# Patient Record
Sex: Female | Born: 1970 | Race: Black or African American | Hispanic: No | Marital: Married | Smoking: Former smoker
Health system: Southern US, Community
[De-identification: ages and names within clinical notes are randomized; demographics above are authoritative.]

## PROBLEM LIST (undated history)

## (undated) DIAGNOSIS — R9389 Abnormal findings on diagnostic imaging of other specified body structures: Secondary | ICD-10-CM

## (undated) DIAGNOSIS — D649 Anemia, unspecified: Secondary | ICD-10-CM

## (undated) DIAGNOSIS — R000 Tachycardia, unspecified: Secondary | ICD-10-CM

## (undated) DIAGNOSIS — J45909 Unspecified asthma, uncomplicated: Secondary | ICD-10-CM

## (undated) DIAGNOSIS — I272 Pulmonary hypertension, unspecified: Secondary | ICD-10-CM

## (undated) DIAGNOSIS — D869 Sarcoidosis, unspecified: Secondary | ICD-10-CM

## (undated) DIAGNOSIS — I700 Atherosclerosis of aorta: Secondary | ICD-10-CM

## (undated) HISTORY — DX: Atherosclerosis of aorta: I70.0

## (undated) HISTORY — DX: Sarcoidosis, unspecified: D86.9

## (undated) HISTORY — DX: Pulmonary hypertension, unspecified: I27.20

## (undated) HISTORY — DX: Abnormal findings on diagnostic imaging of other specified body structures: R93.89

## (undated) HISTORY — DX: Anemia, unspecified: D64.9

## (undated) HISTORY — DX: Tachycardia, unspecified: R00.0

## (undated) HISTORY — PX: TUBAL LIGATION: SHX77

## (undated) HISTORY — DX: Unspecified asthma, uncomplicated: J45.909

---

## 2008-09-02 ENCOUNTER — Encounter: Payer: Self-pay | Admitting: Internal Medicine

## 2008-09-03 ENCOUNTER — Ambulatory Visit: Payer: Self-pay | Admitting: Internal Medicine

## 2008-09-03 DIAGNOSIS — J301 Allergic rhinitis due to pollen: Secondary | ICD-10-CM

## 2008-09-03 DIAGNOSIS — J45998 Other asthma: Secondary | ICD-10-CM

## 2008-09-03 LAB — CONVERTED CEMR LAB: Angiotensin 1 Converting Enzyme: 93 units/L — ABNORMAL HIGH (ref 9–67)

## 2008-09-05 HISTORY — PX: BRONCHOSCOPY: SUR163

## 2008-09-11 ENCOUNTER — Ambulatory Visit: Admission: RE | Admit: 2008-09-11 | Discharge: 2008-09-11 | Payer: Self-pay | Admitting: Internal Medicine

## 2008-09-11 ENCOUNTER — Encounter: Payer: Self-pay | Admitting: Internal Medicine

## 2008-09-11 ENCOUNTER — Ambulatory Visit: Payer: Self-pay | Admitting: Internal Medicine

## 2008-09-17 ENCOUNTER — Ambulatory Visit: Payer: Self-pay | Admitting: Internal Medicine

## 2008-09-17 ENCOUNTER — Telehealth: Payer: Self-pay | Admitting: Internal Medicine

## 2008-09-17 DIAGNOSIS — D869 Sarcoidosis, unspecified: Secondary | ICD-10-CM

## 2008-09-22 ENCOUNTER — Ambulatory Visit: Payer: Self-pay | Admitting: Internal Medicine

## 2008-09-22 DIAGNOSIS — R0602 Shortness of breath: Secondary | ICD-10-CM

## 2008-10-03 ENCOUNTER — Telehealth (INDEPENDENT_AMBULATORY_CARE_PROVIDER_SITE_OTHER): Payer: Self-pay | Admitting: *Deleted

## 2008-10-23 ENCOUNTER — Ambulatory Visit: Payer: Self-pay | Admitting: Internal Medicine

## 2008-10-30 ENCOUNTER — Telehealth (INDEPENDENT_AMBULATORY_CARE_PROVIDER_SITE_OTHER): Payer: Self-pay | Admitting: *Deleted

## 2008-11-21 ENCOUNTER — Ambulatory Visit: Payer: Self-pay | Admitting: Internal Medicine

## 2008-12-08 LAB — CONVERTED CEMR LAB: Angiotensin 1 Converting Enzyme: 41 units/L (ref 13–100)

## 2009-01-22 ENCOUNTER — Ambulatory Visit: Payer: Self-pay | Admitting: Internal Medicine

## 2009-04-02 ENCOUNTER — Ambulatory Visit: Payer: Self-pay | Admitting: Internal Medicine

## 2009-06-02 ENCOUNTER — Ambulatory Visit: Payer: Self-pay | Admitting: Internal Medicine

## 2009-06-29 ENCOUNTER — Telehealth (INDEPENDENT_AMBULATORY_CARE_PROVIDER_SITE_OTHER): Payer: Self-pay | Admitting: *Deleted

## 2009-09-07 ENCOUNTER — Telehealth: Payer: Self-pay | Admitting: Internal Medicine

## 2009-10-20 ENCOUNTER — Telehealth: Payer: Self-pay | Admitting: Internal Medicine

## 2009-12-01 ENCOUNTER — Ambulatory Visit: Payer: Self-pay | Admitting: Internal Medicine

## 2009-12-03 ENCOUNTER — Telehealth: Payer: Self-pay | Admitting: Internal Medicine

## 2009-12-07 LAB — CONVERTED CEMR LAB
ALT: 12 units/L (ref 0–35)
AST: 20 units/L (ref 0–37)
BUN: 7 mg/dL (ref 6–23)
Bilirubin, Direct: 0.1 mg/dL (ref 0.0–0.3)
Calcium: 8.9 mg/dL (ref 8.4–10.5)
GFR calc non Af Amer: 119.17 mL/min (ref >60)
Glucose, Bld: 85 mg/dL (ref 70–99)
Sodium: 138 meq/L (ref 135–145)
Total Bilirubin: 0.5 mg/dL (ref 0.3–1.2)

## 2010-01-05 ENCOUNTER — Ambulatory Visit: Payer: Self-pay | Admitting: Internal Medicine

## 2010-05-06 ENCOUNTER — Ambulatory Visit: Payer: Self-pay | Admitting: Internal Medicine

## 2010-05-12 LAB — CONVERTED CEMR LAB
BUN: 13 mg/dL (ref 6–23)
Calcium: 9 mg/dL (ref 8.4–10.5)
Creatinine, Ser: 0.8 mg/dL (ref 0.4–1.2)
GFR calc non Af Amer: 104.95 mL/min (ref >60)

## 2010-09-07 ENCOUNTER — Ambulatory Visit
Admission: RE | Admit: 2010-09-07 | Discharge: 2010-09-07 | Payer: Self-pay | Source: Home / Self Care | Attending: Internal Medicine | Admitting: Internal Medicine

## 2010-09-07 DIAGNOSIS — R042 Hemoptysis: Secondary | ICD-10-CM | POA: Insufficient documentation

## 2010-09-14 ENCOUNTER — Telehealth (INDEPENDENT_AMBULATORY_CARE_PROVIDER_SITE_OTHER): Payer: Self-pay | Admitting: *Deleted

## 2010-10-05 NOTE — Assessment & Plan Note (Signed)
Summary: 4 months/apc   Copy to:  Dr. [facility] Primary Morganne Haile/Referring Audine Mangione:  None  CC:  4 month follow up visit-sarcoidosis and hemoptysis follow up-denies any SOB, wheezing, and or coughing up blood any more.Marland Kitchen  History of Present Illness: 06/02/09- Sarcoid Off prednisone since July 15 and feels well. Notes a minor cough after exercise on treadmill. She denies night sweats, fever, glands, rashes, joint pain, dyspnea or sustained/ significant cough. She declines flu vaccine.  December 01, 2009- Sarcoid Stage II For the first time since original dx, she coughed up small streaks of blood once or twice this weekend. On the same day had sense of chest tightness mid sternum, fatigue. Left temporal and retroorbital headache. Denies other blood, fever, sweat or swollen glands. Dyspnea with exertion. Forearms ache. legs not tight, swollen or sore. Motrin helps her sleep. She has remained off Prednisone since last summer.  Jan 05, 2010- Sarcoid  She can tell it is pollen season- not bad. Some cough. Labs had been significant for normal renal and liver function, Stable sarcoid changes on CXR, but elevated ACE level at 111 (9-67).  We put her back on prednisone 10 mg as of December 04, 2009.   May 06, 2010- Sarcoid We had to put her back on prednisone 10 mg daily at last visit in May when ACE level was up to 111. Today she says she is "great". Having some night sweats occasionally/ twice a week. No cough or chest pain. Some stable DOE. Denies rash, glands sore joints. Denies furtehr hemoptysis, myalgias or other acute problems. Weight and BP are stable.   Asthma History    Initial Asthma Severity Rating:    Age range: 12+ years    Symptoms: 0-2 days/week    Nighttime Awakenings: 0-2/month    Interferes w/ normal activity: no limitations    SABA use (not for EIB): 0-2 days/week    Asthma Severity Assessment: Intermittent   Preventive Screening-Counseling &  Management  Alcohol-Tobacco     Smoking Status: quit     Year Started: 2000     Year Quit: 2010     Pack years: 2 cigs per day  Current Medications (verified): 1)  Prednisone 5 Mg Tabs (Prednisone) .... Take 1 By Mouth  Two Times A Day  Allergies (verified): No Known Drug Allergies  Past History:  Past Medical History: Last updated: 09/17/2008 ABNORMAL CHEST XRAY (ICD-793.1) HEMOPTYSIS (ICD-786.3) ASTHMA (ICD-493.90) ALLERGY (ICD-995.3) Sarcoid- bronch Neg 09/2008, ACE 93  Past Surgical History: Last updated: 09/17/2008 Tubal ligation- 1995 c-section bronchoscopy 1/10 NEG  Family History: Last updated: 09/03/2008 Asthma-PGF Lung CA-MGF  Social History: Last updated: 09/03/2008 Current smoker since age 4.  She currently smokes 2 cigs per day No ETOH Married Diplomatic Services operational officer  Risk Factors: Smoking Status: quit (05/06/2010)  Social History: Smoking Status:  quit Pack years:  2 cigs per day  Review of Systems      See HPI       The patient complains of dyspnea on exertion.  The patient denies anorexia, fever, weight loss, weight gain, vision loss, decreased hearing, hoarseness, chest pain, syncope, peripheral edema, prolonged cough, headaches, hemoptysis, abdominal pain, melena, severe indigestion/heartburn, muscle weakness, suspicious skin lesions, unusual weight change, and enlarged lymph nodes.    Vital Signs:  Patient profile:   40 year old female Height:      62 inches Weight:      176.25 pounds BMI:  32.35 O2 Sat:      98 % on Room air Pulse rate:   92 / minute BP sitting:   116 / 72  (right arm) Cuff size:   regular  Vitals Entered By: Reynaldo Minium CMA (May 06, 2010 9:18 AM)  O2 Flow:  Room air CC: 4 month follow up visit-sarcoidosis and hemoptysis follow up-denies any SOB,wheezing, or coughing up blood any more.   Physical Exam  Additional Exam:  General: A/Ox3; pleasant and cooperative, NAD,  well appearing SKIN: no rash,  lesions NODES: small bilateral cervical nodes HEENT: [place]/AT, EOM- WNL, Conjuctivae- clear, PERRLA, TM-WNL, Nose- clear, Throat- clear and wnl, tonsils present, Mallampati II NECK: Supple w/ fair ROM, JVD- none, normal carotid impulses w/o bruits Thyroid-  CHEST: Clear to P&A, not coughing HEART: RRR, no m/g/r heard Abdomen- liver edge not palpable- no HSM ZOX:WRUE, nl pulses, no edema , NEURO: Grossly intact to observation       Impression & Recommendations:  Problem # 1:  SARCOIDOSIS (ICD-135) Stable and symptomatically improved with prednisone. She has cleared the cough and hemoptysis that were concerning in the Spring. We will recheck her ACE and a glucose level/ chem, then drop her prednisone to 7.5 mg daily We have discussed sarcoid, medical concerns, treatment goals, prednisone side effects and brief comparison with methotrexate as an alternative.  Problem # 2:  ALLERGY (ICD-995.3) Not having obvious pollen-type rhinitis or conjunctivtis now while on prednsione.  Medications Added to Medication List This Visit: 1)  Prednisone 5 Mg Tabs (Prednisone) .Marland Kitchen.. 1 and a half pills daily  Other Orders: Est. Patient Level IV (45409) TLB-BMP (Basic Metabolic Panel-BMET) (80048-METABOL) T-Angiotensin i-Converting Enzyme (81191-47829)  Patient Instructions: 1)  Please schedule a follow-up appointment in 4 months. 2)  Lab 3)  Reduce prednisone to 1 and a half pills daily  Prescriptions: PREDNISONE 5 MG TABS (PREDNISONE) 1 and a half pills daily  #100 x 0   Entered and Authorized by:   Waymon Budge MD   Signed by:   Waymon Budge MD on 05/06/2010   Method used:   Historical   RxID:   [id number]

## 2010-10-05 NOTE — Assessment & Plan Note (Signed)
Summary: 1 month/apc   Copy to:  Dr. [facility] Primary Provider/Referring Provider:  None  CC:  1 month follow up visit.  History of Present Illness: 04/01/09- Sarcoid She reduced prednisone after last here to 10 mg daily, then for the past month has been on 5 mg daily. she feels stable, staying in to avoid heat. Denies cough, chest pain, night sweats, fever, rash, glands. Has not had run-up of BP or glucose noted.  06/02/09- Sarcoid Off prednisone since July 15 and feels well. Notes a minor cough after exercise on treadmill. She denies night sweats, fever, glands, rashes, joint pain, dyspnea or sustained/ significant cough. She declines flu vaccine.  December 01, 2009- Sarcoid Stage II For the first time since original dx, she coughed up small streaks of blood once or twice this weekend. On the same day had sense of chest tightness mid sternum, fatigue. Left temporal and retroorbital headache. Denies other blood, fever, sweat or swollen glands. Dyspnea with exertion. Forearms ache. legs not tight, swollen or sore. Motrin helps her sleep. She has remained off Prednisone since last summer.  Jan 05, 2010- Sarcoid  She can tell it is pollen season- not bad. Some cough. Labs had been significant for normal renal and liver function, Stable sarcoid changes on CXR, but elevated ACE level at 111 (9-67).  We put her back on prednisone 10 mg as of December 04, 2009.    Current Medications (verified): 1)  Prednisone 5 Mg Tabs (Prednisone) .... Take 1 By Mouth  Two Times A Day  Allergies (verified): No Known Drug Allergies  Past History:  Past Medical History: Last updated: 09/17/2008 ABNORMAL CHEST XRAY (ICD-793.1) HEMOPTYSIS (ICD-786.3) ASTHMA (ICD-493.90) ALLERGY (ICD-995.3) Sarcoid- bronch Neg 09/2008, ACE 93  Past Surgical History: Last updated: 09/17/2008 Tubal ligation- 1995 c-section bronchoscopy 1/10 NEG  Family History: Last updated:  09/03/2008 Asthma-PGF Lung CA-MGF  Social History: Last updated: 09/03/2008 Current smoker since age 25.  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    Vital Signs:  Patient profile:   40 year old female Height:      62 inches Weight:      177.38 pounds BMI:     32.56 O2 Sat:      99 % on Room air Pulse rate:   107 / minute BP sitting:   122 / 74  (left arm) Cuff size:   regular  Vitals Entered By: Reynaldo Minium CMA (Jan 05, 2010 2:07 PM)  O2 Flow:  Room air  Physical Exam  Additional Exam:  General: A/Ox3; pleasant and cooperative, NAD, Now heavier, well appearing SKIN: no rash, lesions NODES: small bilateral cervical nodes HEENT: [place]/AT, EOM- WNL, Conjuctivae- clear, PERRLA, TM-WNL, Nose- clear, Throat- clear and wnl, tonsils present, Mallampati II NECK: Supple w/ fair ROM, JVD- none, normal carotid impulses w/o bruits Thyroid- nor CHEST: Clear to P&A, dry cough HEART: RRR, no m/g/r heard Abdomen- liver edge not palpable- no HSM WUJ:WJXB, nl pulses, no edema , NEURO: Grossly intact to observation       Impression & Recommendations:  Problem # 1:  SARCOIDOSIS (ICD-135) We will continue prednisone at 10mg  and reassess at next visit. We had a detailed  discussion lasting over 20 minutes, related to steroid side effects and alternatives, sarcoid and options.  Problem # 2:  HEMOPTYSIS (ICD-786.3) There has been no recurrence Her updated medication list for this problem includes:    Prednisone 5 Mg Tabs (Prednisone) .Marland Kitchen... Take 1 by mouth  two times a day  Medications Added to Medication List This Visit: 1)  Prednisone 5 Mg Tabs (Prednisone) .... Take 1 by mouth   two times a day  Other Orders: Est. Patient Level III (30865)  Patient Instructions: 1)  Please schedule a follow-up appointment in 4 months. 2)  Continue prednisone 10 mg daily 3)  Script for prednisone sent Prescriptions: PREDNISONE 5 MG TABS (PREDNISONE) take 1 by mouth  two times a day  #100 x 1   Entered and Authorized by:   Waymon Budge MD   Signed by:   Waymon Budge MD on 01/05/2010   Method used:   Electronically to        [facility]  [phone]* (retail)       [address]       [place], [place]  [address]       Ph: [phone] or [phone]       Fax: [phone]   RxID:   [id number]

## 2010-10-05 NOTE — Progress Notes (Signed)
Summary: talk to nurse  Phone Note Call from Patient   Caller: Patient Call For: Everlee Quakenbush Summary of Call: pt went to er and was put back on prednisone and given xray Initial call taken by: Rickard Patience,  September 07, 2009 9:39 AM  Follow-up for Phone Call        [facility]. Carron Curie CMA  September 07, 2009 10:11 AM  Pt just wanted to make CY that she went to and Urgent Care and was given a Zpak and prednisone 20mg  take 3 tabs daily x 6 days.  Carron Curie CMA  September 07, 2009 10:25 AM   Additional Follow-up for Phone Call Additional follow up Details #1::        Noted Additional Follow-up by: Waymon Budge MD,  September 07, 2009 5:42 PM

## 2010-10-05 NOTE — Progress Notes (Signed)
Summary: wants lab results  Phone Note Call from Patient Call back at Work Phone [phone]   Caller: Patient Call For: Ester Mabe Summary of Call: patient is calling to get results of her labs Initial call taken by: Valinda Hoar,  December 03, 2009 1:04 PM  Follow-up for Phone Call        Labs and CXR unsigned in EMR - will forward message along with copy of labs and cxr to CY - pls advise.  Thanks! Gweneth Dimitri RN  December 03, 2009 1:41 PM   Additional Follow-up for Phone Call Additional follow up Details #1::        See addenda to lab results. Additional Follow-up by: Waymon Budge MD,  December 04, 2009 9:09 AM    Additional Follow-up for Phone Call Additional follow up Details #2::    Pt is aware of results per append.Reynaldo Minium CMA  December 04, 2009 1:16 PM Pt states she has prednisone at home to use and will not need Rx for a while but will call when needed. Pt verbalized she understood the directions. Reynaldo Minium CMA  December 04, 2009 1:16 PM

## 2010-10-05 NOTE — Progress Notes (Signed)
Summary: diagnosis  Phone Note Call from Patient   Caller: Patient Call For: Chett Taniguchi Summary of Call: pt have questions about her diagnosis  Initial call taken by: Rickard Patience,  October 20, 2009 2:59 PM  Follow-up for Phone Call        Pt states her insurance company sent a form to CY to complete because pt was renewing her policy. She states on the paperwork it states she has stage 3 sarcoidosis. She staets she has never been told she was in stage 3, and because of this she is unable to renew her insurance policy. Pt wanted to discuss this with CY. Please advise. 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 I am sending her a note and copy of CXR report to that effect. Additional Follow-up by: Waymon Budge MD,  October 22, 2009 5:33 PM    Additional Follow-up for Phone Call Additional follow up Details #2::    letter sent Follow-up by: Waymon Budge MD,  November 02, 2009 1:16 PM

## 2010-10-05 NOTE — Assessment & Plan Note (Signed)
Summary: sarcoidosis///kp   Copy to:  Dr. Carren Rang [facility] rgent Care Primary Provider/Referring Provider:  None  CC:  Episode of "coughing up dark and thin blood" on Saturday.Marland Kitchen  History of Present Illness: 01/22/09- Sarcoid Feeling well last 2 months.Denies fever, sweat, glands, cough, chest pain. reviewed CXR and ACE from last visit.  Continues prednisone at 15 and concerned about weight gain. discussed exercise.  04/01/09- Sarcoid She reduced prednisone after last here to 10 mg daily, then for the past month has been on 5 mg daily. she feels stable, staying in to avoid heat. Denies cough, chest pain, night sweats, fever, rash, glands. Has not had run-up of BP or glucose noted.  06/02/09- Sarcoid Off prednisone since July 15 and feels well. Notes a minor cough after exercise on treadmill. She denies night sweats, fever, glands, rashes, joint pain, dyspnea or sustained/ significant cough. She declines flu vaccine.  December 01, 2009- Sarcoid Stage II For the first time since original dx, she coughed up small streaks of blood once or twice this weekend. On the same day had sense of chest tightness mid sternum, fatigue. Left temporal and retroorbital headache. Denies other blood, fever, sweat or swollen glands. Dyspnea with exertion. Forearms ache. legs not tight, swollen or sore. Motrin helps her sleep. She has remained off Prednisone since last summer.  Current Medications (verified): 1)  None  Allergies (verified): No Known Drug Allergies  Past History:  Past Medical History: Last updated: 09/17/2008 ABNORMAL CHEST XRAY (ICD-793.1) HEMOPTYSIS (ICD-786.3) ASTHMA (ICD-493.90) ALLERGY (ICD-995.3) Sarcoid- bronch Neg 09/2008, ACE 93  Past Surgical History: Last updated: 09/17/2008 Tubal ligation- 1995 c-section bronchoscopy 1/10 NEG  Family History: Last updated: 09/03/2008 Asthma-PGF Lung CA-MGF  Social History: Last updated: 09/03/2008 Current smoker since  age 63.  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  The patient denies anorexia, fever, weight loss, weight gain, vision loss, decreased hearing, hoarseness, chest pain, syncope, peripheral edema, abdominal pain, and severe indigestion/heartburn.    Vital Signs:  Patient profile:   40 year old female Height:      62 inches Weight:      177.38 pounds BMI:     32.56 O2 Sat:      100 % on Room air Pulse rate:   85 / minute BP sitting:   110 / 76  (left arm) Cuff size:   regular  Vitals Entered By: Reynaldo Minium CMA (December 01, 2009 9:17 AM)  O2 Flow:  Room air  Physical Exam  Additional Exam:  General: A/Ox3; pleasant and cooperative, NAD, Now obese, well appearing SKIN: no rash, lesions NODES: small bilateral cervical nodes HEENT: [place]/AT, EOM- WNL, Conjuctivae- clear, PERRLA, TM-WNL, Nose- clear, Throat- clear and wnl, tonsils present, Melampatti II NECK: Supple w/ fair ROM, JVD- none, normal carotid impulses w/o bruits Thyroid- nor CHEST: Clear to P&A, dry cough HEART: RRR, no m/g/r heard Abdomen- liver edge not palpable- no HSM YQI:HKVQ, nl pulses, no edema , neg Homan's NEURO: Grossly intact to observation       Impression & Recommendations:  Problem # 1:  SARCOIDOSIS (ICD-135) Probable exacerbation of sarcoid off steroids, with hemorrhagic bronchitis. We will get CXR sand ACE. We discussed steroids and outlined other therapies sometimes used. i know she doesn't want to have to del with  this- she became watery-eyed during the discussion. we talked about the decision whether or not to treat at all.  Problem # 2:  ALLERGY (ICD-995.3)  If it were an isolated issue I would suspect seasonal allergic rhinitis causing headache and nasal congestion, but it may well be her sarcoid. We will give a steroid nasal spray  sample to start.  Other Orders: Est. Patient Level III (60454) T-Angiotensin i-Converting Enzyme (09811-91478) T-2 View CXR (71020TC) TLB-Hepatic/Liver Function Pnl (80076-HEPATIC) TLB-BMP (Basic Metabolic Panel-BMET) (80048-METABOL)  Patient Instructions: 1)  Please schedule a follow-up appointment in 1 month. 2)  A chest x-ray has been recommended.  Your imaging study may require preauthorization.  3)  Lab 4)  We will call with recommendations after test results come in. 5)  Nasal steroid spray sample: 2 sprays each nostril once daily at bedtime

## 2010-10-07 NOTE — Assessment & Plan Note (Signed)
Summary: 4 months/ mbw   Copy to:  Dr. Carren Rang [facility] rgent Care Primary Provider/Referring Provider:  None  CC:  4 month follow up visit-sarcoidosis and allergies..  History of Present Illness: Jan 05, 2010- Sarcoid  She can tell it is pollen season- not bad. Some cough. Labs had been significant for normal renal and liver function, Stable sarcoid changes on CXR, but elevated ACE level at 111 (9-67).  We put her back on prednisone 10 mg as of December 04, 2009.   May 06, 2010- Sarcoid We had to put her back on prednisone 10 mg daily at last visit in May when ACE level was up to 111. Today she says she is "great". Having some night sweats occasionally/ twice a week. No cough or chest pain. Some stable DOE. Denies rash, glands sore joints. Denies further hemoptysis, myalgias or other acute problems. Weight and BP are stable.  September 07, 2010-  Sarcoid Nurse-CC: Dr. Carren Rang [facility] 05/06/09-ACE 79 (8-52), Nl BMP Taking 1 prednisone every other day and feels very well. Coughs only if "overexerted" or cold air. Denies fever, sweat, nodes, rash. Last CXR was in March, 2010.        Preventive Screening-Counseling & Management  Alcohol-Tobacco     Smoking Status: quit     Year Started: 2000     Year Quit: 2010     Pack years: 2 cigs per day  Current Medications (verified): 1)  Prednisone 5 Mg Tabs (Prednisone) .... Take 1 By Mouth Every Other Day  Allergies (verified): No Known Drug Allergies  Past History:  Past Medical History: Last updated: 09/17/2008 ABNORMAL CHEST XRAY (ICD-793.1) HEMOPTYSIS (ICD-786.3) ASTHMA (ICD-493.90) ALLERGY (ICD-995.3) Sarcoid- bronch Neg 09/2008, ACE 93  Past Surgical History: Last updated: 09/17/2008 Tubal ligation- 1995 c-section bronchoscopy 1/10 NEG  Family History: Last updated: 09/03/2008 Asthma-PGF Lung CA-MGF  Social History: Last updated: 09/03/2008 Current smoker since age 70.   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  The patient denies shortness of breath at rest, productive cough, non-productive cough, coughing up blood, chest pain, acid heartburn, indigestion, loss of appetite, weight change, abdominal pain, difficulty swallowing, sore throat, tooth/dental problems, headaches, nasal congestion/difficulty breathing through nose, sneezing, itching, ear ache, rash, change in color of mucus, and fever.    Vital Signs:  Patient profile:   40 year old female Height:      62 inches Weight:      177.50 pounds BMI:     32.58 O2 Sat:      100 % on Room air Pulse rate:   76 / minute BP sitting:   102 / 68  (left arm) Cuff size:   large  Vitals Entered By: Reynaldo Minium CMA (September 07, 2010 9:12 AM)  O2 Flow:  Room air CC: 4 month follow up visit-sarcoidosis and allergies.   Physical Exam  Additional Exam:  General: A/Ox3; pleasant and cooperative, NAD,  well appearing SKIN: no rash, lesions NODES: small bilateral cervical nodes HEENT: [place]/AT, EOM- WNL, Conjuctivae- clear, PERRLA, TM-WNL, Nose- clear, Throat- clear and wnl, tonsils present, Mallampati II NECK: Supple w/ fair ROM, JVD- none, normal carotid impulses w/o bruits Thyroid-  CHEST: Clear to P&A, not coughing HEART:  RRR, no m/g/r heard Abdomen- liver edge not palpable- no HSM EAV:WUJW, nl pulses, no edema , NEURO: Grossly intact to observation       Impression & Recommendations:  Problem # 1:  SARCOIDOSIS (ICD-135) Clinically she is doing well. We don't ususaly treat the elevated ACE by itself, as discussed, but will get CXR looking for other evidece of progression.   Problem # 2:  HEMOPTYSIS (ICD-786.3)  No blood in a long time. Bleeding was associated with bronchitic inflammtion which has been quiet.    Her updated medication list for this problem includes:    Prednisone 5 Mg Tabs (Prednisone) .Marland Kitchen... Take 1 by mouth every other day  Medications Added to Medication List This Visit: 1)  Prednisone 5 Mg Tabs (Prednisone) .... Take 1 by mouth every other day  Other Orders: Est. Patient Level IV (99214) T-2 View CXR (71020TC)  Patient Instructions: 1)  Please schedule a follow-up appointment in 4 months. 2)  continue prednsione 5 mg every other day for now- call if questions or problems 3)  A chest x-ray has been recommended.  Your imaging study may require preauthorization.

## 2010-10-07 NOTE — Progress Notes (Signed)
Summary: returning call-ATC NA x 2  Phone Note Call from Patient Call back at Work Phone [phone]   Caller: Patient Call For: young Summary of Call: Returning phone call. Initial call taken by: Darletta Moll,  September 14, 2010 10:05 AM  Follow-up for Phone Call        ATC pt at the number given and msg states that the number is not in service.  Called the home number and NA, unable to leave a msg, WCB. Vernie Murders  September 14, 2010 10:19 AM  ATC NA and still unable to leave msg, WCB Vernie Murders  September 14, 2010 1:50 PM   Additional Follow-up for Phone Call Additional follow up Details #1::        Spoke with pt and notified of results per CDY append to cxr.  Pt verbalized understanding. Additional Follow-up by: Vernie Murders,  September 14, 2010 2:24 PM

## 2010-11-30 ENCOUNTER — Encounter: Payer: Self-pay | Admitting: Internal Medicine

## 2010-11-30 ENCOUNTER — Ambulatory Visit (INDEPENDENT_AMBULATORY_CARE_PROVIDER_SITE_OTHER)
Admission: RE | Admit: 2010-11-30 | Discharge: 2010-11-30 | Disposition: A | Discharge status: Home / Self Care | Payer: BC Managed Care – PPO | Source: Ambulatory Visit | Attending: Internal Medicine | Admitting: Internal Medicine

## 2010-11-30 ENCOUNTER — Ambulatory Visit (INDEPENDENT_AMBULATORY_CARE_PROVIDER_SITE_OTHER): Payer: BC Managed Care – PPO | Admitting: Internal Medicine

## 2010-11-30 ENCOUNTER — Other Ambulatory Visit (INDEPENDENT_AMBULATORY_CARE_PROVIDER_SITE_OTHER): Payer: BC Managed Care – PPO

## 2010-11-30 ENCOUNTER — Other Ambulatory Visit: Payer: Self-pay | Admitting: Internal Medicine

## 2010-11-30 VITALS — BP 102/60 | HR 87 | Ht 62.0 in | Wt 178.4 lb

## 2010-11-30 DIAGNOSIS — R0602 Shortness of breath: Secondary | ICD-10-CM

## 2010-11-30 DIAGNOSIS — D869 Sarcoidosis, unspecified: Secondary | ICD-10-CM

## 2010-11-30 DIAGNOSIS — R042 Hemoptysis: Secondary | ICD-10-CM

## 2010-11-30 DIAGNOSIS — J45909 Unspecified asthma, uncomplicated: Secondary | ICD-10-CM

## 2010-11-30 LAB — BASIC METABOLIC PANEL
BUN: 11 mg/dL (ref 6–23)
CO2: 28 mEq/L (ref 19–32)
Calcium: 8.5 mg/dL (ref 8.4–10.5)
Creatinine, Ser: 0.8 mg/dL (ref 0.4–1.2)

## 2010-11-30 LAB — HEPATIC FUNCTION PANEL
Albumin: 3.6 g/dL (ref 3.5–5.2)
Bilirubin, Direct: 0.1 mg/dL (ref 0.0–0.3)
Total Protein: 8 g/dL (ref 6.0–8.3)

## 2010-11-30 MED ORDER — PREDNISONE 5 MG PO TABS: 10.0000 mg | ORAL_TABLET | Freq: Every day | ORAL | Status: AC

## 2010-11-30 MED ORDER — ALBUTEROL SULFATE HFA 108 (90 BASE) MCG/ACT IN AERS: 2.0000 | INHALATION_SPRAY | Freq: Four times a day (QID) | RESPIRATORY_TRACT | Status: DC | PRN

## 2010-11-30 NOTE — Assessment & Plan Note (Addendum)
First concern is that her sarcoid may be flaring. We will update labs including kidney and liver with ACE level, and CXR. Until results are back I will have her take increase from prednisone 5 mg every other day to 10 mg daily.

## 2010-11-30 NOTE — Patient Instructions (Signed)
Orders- CXR PA& Lat, CBC w/ diff, BMET, Hepatic panel, Angiotensin Converting Enzyme level   Script for prednisone 5 mg, to take 2 daily x 7 days, then one daily.  Script for rescue bronchodilator inhaler to use as needed     2 puffs, up to 4 times daily    For wheeze or chest tightness.

## 2010-11-30 NOTE — Progress Notes (Signed)
  Subjective:    Patient ID: Nancy Villarreal, female    DOB: Sep 30, 1970, 40 y.o.   MRN: [id number]  HPI 42 yoF former smoker followed here with sarcoid, currently maintained on prednisone 5 mg every other day Bronch NEG 09/24/08- dx based on mediastinal adenopathy/ interstitial disease, ACE 90, response to steroids . She was put back on daily prednisone at 10 mg daily in May, 2011 for ACE 111.  Has had hemoptysis in past, allergic rhinitis and asthma. In September, 2011 was doing very well and was changed to 5 mg every other day. Felt well here January, 2012. Has had some hx asthma and allergic rhinitis. CXR 09/07/10 showed stable Stage III intersiittial scarring unchanged back to 09/11/08. In last 2 weeks has noted increased dyspnea with wheeze lying down, night sweats, joint pains, fatigue, hair falling out, frightened that she couldn't breathe, dyspnea walking parking lot. Last night propped up to sleep. Denies chest pain, palpitation, rash or nodes, leg swelling or pain.      Review of Systems Per HPI    Constitutional:   HEENT:   No headaches,  Difficulty swallowing,  Tooth/dental problems,  Sore throat,                No sneezing, itching, ear ache, nasal congestion, post nasal drip,   CV:  No chest pain,  , PND, swelling in lower extremities, anasarca, dizziness, palpitations  GI  No heartburn, indigestion, abdominal pain, nausea, vomiting, diarrhea, change in bowel habits, loss of appetite  Resp:  shortness of breath with exertion .  No excess mucus, no productive cough,  No non-productive cough,  No coughing up of blood.  No change in color of mucus.  No wheezing.  No chest wall deformity  Skin: no rash or lesions.  GU: no dysuria, change in color of urine, no urgency or frequency.  No flank pain.  MS: joint pain .  No decreased range of motion.  No back pain.  Psych:  No change in mood or affect. No depression or anxiety.  No memory loss.  Objective:   Physical Exam General-  Alert, Oriented, Affect-appropriate, Distress- none acute, overweight  Skin- rash-none, lesions- none, excoriation- none  Lymphadenopathy- none  Head- atraumatic  Eyes- Gross vision intact, PERRLA, conjunctivae clear, secretions  Ears- Normal- Hearing, canals, Tm L ,   R ,  Nose- Clear, No-Septal dev, mucus, polyps, erosion, perforation   Throat- Mallampati II , mucosa clear , drainage- none, tonsils- atrophic  Neck- flexible , trachea midline, no stridor , thyroid nl, carotid no bruit  Chest - symmetrical excursion , unlabored     Heart/CV- RRR , no murmur , no gallop  , no rub, nl s1 s2                     - JVD- none , edema- none, stasis changes- none, varices- none     Lung- clear to P&A, wheeze- none, cough- none , dullness-none, rub- none     Chest wall- atraumatic, no scar  Abd- tender-no, distended-no, bowel sounds-present, HSM- no  Br/ Gen/ Rectal- Not done, not indicated  Extrem- cyanosis- none, clubbing, none, atrophy- none, strength- nl  Neuro- grossly intact to observation        Assessment & Plan:

## 2010-11-30 NOTE — Assessment & Plan Note (Signed)
The timing of increased respiratory symptoms coincides with current pollen season and may not be a coincidence. We will give a rescue inhaler for prn use.

## 2010-12-01 ENCOUNTER — Telehealth: Payer: Self-pay | Admitting: *Deleted

## 2010-12-01 ENCOUNTER — Encounter: Payer: Self-pay | Admitting: Internal Medicine

## 2010-12-01 ENCOUNTER — Other Ambulatory Visit: Payer: BC Managed Care – PPO

## 2010-12-01 DIAGNOSIS — D869 Sarcoidosis, unspecified: Secondary | ICD-10-CM

## 2010-12-01 LAB — CONVERTED CEMR LAB
Lymphocytes Relative: 39 % (ref 12–46)
Lymphs Abs: 1.9 10*3/uL (ref 0.7–4.0)
MCV: 75.6 fL — ABNORMAL LOW (ref 78.0–100.0)
Monocytes Relative: 8 % (ref 3–12)
Neutro Abs: 2.4 10*3/uL (ref 1.7–7.7)
Neutrophils Relative %: 51 % (ref 43–77)
RBC: 4.38 M/uL (ref 3.87–5.11)
WBC: 4.8 10*3/uL (ref 4.0–10.5)

## 2010-12-01 LAB — CBC WITH DIFFERENTIAL/PLATELET
Basophils Absolute: 0.1 10*3/uL (ref 0.0–0.1)
Basophils Relative: 2.9 % (ref 0.0–3.0)
Eosinophils Absolute: 0 10*3/uL (ref 0.0–0.7)
Eosinophils Relative: 0.3 % (ref 0.0–5.0)
Lymphocytes Relative: 30.3 % (ref 12.0–46.0)
MCV: 78.2 fl (ref 78.0–100.0)
Neutrophils Relative %: 57.1 % (ref 43.0–77.0)
Platelets: 220 10*3/uL (ref 150.0–400.0)
RBC: 4.1 Mil/uL (ref 3.87–5.11)
WBC: 4.9 10*3/uL (ref 4.5–10.5)

## 2010-12-01 LAB — ANGIOTENSIN CONVERTING ENZYME: Angiotensin-Converting Enzyme: 59 U/L — ABNORMAL HIGH (ref 8–52)

## 2010-12-01 NOTE — Telephone Encounter (Signed)
Results are on Dr. Roxy Cedar cart.  Per Florentina Addison, he is aware of this and has reviewed the results.  Will forward message to him.

## 2010-12-01 NOTE — Assessment & Plan Note (Signed)
Describes acute dyspnea that was more frightening than severe. It sounds more like an acute bronchospastic interval, rather than aspiration, pulmonary embolis, or cardiac event- howver depending on her course these will be reconsidered.

## 2010-12-01 NOTE — Assessment & Plan Note (Signed)
She has had intermittent occasional streak heme, most consistent with a hemorrhagic bronchitis and previously self limited or responsive to prednisone.

## 2010-12-02 NOTE — Progress Notes (Signed)
Quick Note:  Pt aware of results.Nancy Villarreal ______

## 2010-12-02 NOTE — Progress Notes (Signed)
Quick Note:  Pt aware of results and recs from CDY.Vivianne Spence ______

## 2010-12-02 NOTE — Progress Notes (Signed)
Quick Note:  PT aware of results and recs from CDY.Vivianne Spence ______

## 2010-12-02 NOTE — Progress Notes (Signed)
Quick Note:  Spoke with patient-aware of results and recs from CDY. Nancy Villarreal ______

## 2010-12-13 NOTE — Telephone Encounter (Signed)
CDY took care of this already

## 2010-12-20 LAB — FUNGUS CULTURE W SMEAR: Fungal Smear: NONE SEEN

## 2010-12-20 LAB — CULTURE, RESPIRATORY W GRAM STAIN

## 2010-12-20 LAB — AFB CULTURE WITH SMEAR (NOT AT ARMC): Acid Fast Smear: NONE SEEN

## 2010-12-20 LAB — AFB STAIN

## 2011-01-07 ENCOUNTER — Ambulatory Visit: Payer: Self-pay | Admitting: Internal Medicine

## 2011-01-18 NOTE — Op Note (Signed)
Nancy, FARNWORTH            ACCOUNT NO.:  [id number]   MEDICAL RECORD NO.:  [id number]          PATIENT TYPE:  AMB   LOCATION:  CARD                         FACILITY:  [facility]   PHYSICIAN:  Clinton D. Maple Hudson, MD, FCCP, FACPDATE OF BIRTH:  1969/11/24   DATE OF PROCEDURE:  09/11/2008  DATE OF DISCHARGE:                               OPERATIVE REPORT   INDICATIONS FOR PROCEDURE:  A 40 year old nonsmoking African American  woman with interstitial infiltrates, cough, hemoptysis and elevated  angiotensin-converting enzyme level.  Bronchoscopy is performed to  confirm diagnosis with preoperative clinical suspicion of sarcoid.   DESCRIPTION OF PROCEDURE:  Bronchoscopy was performed on an outpatient  basis in the [facility].   Premedication was not required.  After fully informed consent sedation  was provided with Versed cumulative dose of 4 mg intravenously.  The  upper airway was anesthetized topically with lidocaine.  Oxygen was  provided to sustain oxygen saturation over 95%.  Vital signs were  stable.  A fiberoptic videoscope was advanced via the left nostril to  the level of the vocal cords without difficulty.  Cough was moderate.  The cords moved normally.  The trachea and main carina were  unremarkable.  There was a generalized mild erythematous bronchitis with  clear secretions.  Sequential examination of each lobar and segmental  airway bilaterally to the fourth division level revealed no  endobronchial lesions.  With fluoroscopic guidance the bronchoscope was  directed into the right lung.  Bronchial lavage was performed with  saline in the right upper and right middle lobes.  These areas were then  brushed and finally biopsied by standard transbronchial lung biopsy  technique.  There was mild self-limited bleeding and moderate cough.  No  apparent complication was recognized pending chest x-ray.  The patient  was awake and conversant following the procedure.   She will be held  until stable and then released home with family to office follow-up.   FINAL IMPRESSION:  Findings consistent with sarcoidosis.      Clinton D. Maple Hudson, MD, [facility], FACP  Electronically Signed     CDY/MEDQ  D:  09/11/2008  T:  09/11/2008  Job:  [id number]   cc:   Carren Rang, Dr.  [facility]

## 2011-01-21 NOTE — Letter (Signed)
October 22, 2009     RE:  AVYANNA, SPADA  MRN:  [id number]  /  DOB:  March 30, 1971   Reference:  Sarcoid   To Whom It May Concern:   I was asked in an insurance questionnaire to indicate sarcoid stage in  September.  At that time, I had looked at the chest x-ray and seen  interstitial marking because of which I described her as sarcoid stage  III.  Subsequently, the radiologist reviewed the hilar study, recognized  hilar adenopathy as outlined on the enclosed report.  The presence of  hilar adenopathy and interstitial prominence would qualify her as stage  II.  At our last office visit on June 02, 2009, she was doing well  and she had been off of prednisone since March 19, 2009.    Sincerely,      Clinton D. Maple Hudson, MD, [facility], FACP  Electronically Signed    CDY/MedQ  DD: 10/22/2009  DT: 10/23/2009  Job #: [id number]

## 2011-02-09 LAB — HM PAP SMEAR: HM Pap smear: NORMAL

## 2011-02-15 ENCOUNTER — Ambulatory Visit (INDEPENDENT_AMBULATORY_CARE_PROVIDER_SITE_OTHER): Payer: BC Managed Care – PPO | Admitting: Internal Medicine

## 2011-02-15 ENCOUNTER — Encounter: Payer: Self-pay | Admitting: Internal Medicine

## 2011-02-15 VITALS — BP 106/68 | HR 84 | Ht 62.0 in | Wt 182.6 lb

## 2011-02-15 DIAGNOSIS — R0602 Shortness of breath: Secondary | ICD-10-CM

## 2011-02-15 DIAGNOSIS — D869 Sarcoidosis, unspecified: Secondary | ICD-10-CM

## 2011-02-15 DIAGNOSIS — R042 Hemoptysis: Secondary | ICD-10-CM

## 2011-02-15 NOTE — Assessment & Plan Note (Signed)
She was as much anxious about her dyspnea as she was short of breath this Spring. It may have been pollen related and we will watch for recurrence, but the feeling is gone now and she walks regularly for exercise.

## 2011-02-15 NOTE — Assessment & Plan Note (Signed)
Never has had a recurrence

## 2011-02-15 NOTE — Patient Instructions (Signed)
Try reducing prednisone now to 3 days per week - M-W-F

## 2011-02-15 NOTE — Progress Notes (Signed)
Subjective:    Patient ID: Nancy Villarreal, female    DOB: [birth year], 40 y.o.   MRN: [id number]  HPI  Subjective:    Patient ID: Nancy Villarreal, female    DOB: Jun 16, 1971, 40 y.o.   MRN: [id number]  HPI 11/30/10- 46 yoF former smoker followed here with sarcoid, currently maintained on prednisone 5 mg every other day Bronch NEG 09/24/08- dx based on mediastinal adenopathy/ interstitial disease, ACE 90, response to steroids . She was put back on daily prednisone at 10 mg daily in May, 2011 for ACE 111.  Has had hemoptysis in past, allergic rhinitis and asthma. In September, 2011 was doing very well and was changed to 5 mg every other day. Felt well here January, 2012. Has had some hx asthma and allergic rhinitis. CXR 09/07/10 showed stable Stage III intersiittial scarring unchanged back to 09/11/08. In last 2 weeks has noted increased dyspnea with wheeze lying down, night sweats, joint pains, fatigue, hair falling out, frightened that she couldn't breathe, dyspnea walking parking lot. Last night propped up to sleep. Denies chest pain, palpitation, rash or nodes, leg swelling or pain.    02/15/11- Sarcoid Stage III, hx asthma, rhinitis She feels she is doing well, still maintaining on prednisone 5 mg every other day. She is comfortable with that and feels well. Admits occasional night sweat. Denies rash, cough, nodes.  We discussed how and when to wean off steroids.  Review of Systems Per HPI    Constitutional:   HEENT:   No headaches,  Difficulty swallowing,  Tooth/dental problems,  Sore throat,                No sneezing, itching, ear ache, nasal congestion, post nasal drip,   CV:  No chest pain,  , PND, swelling in lower extremities, anasarca, dizziness, palpitations  GI  No heartburn, indigestion, abdominal pain, nausea, vomiting, diarrhea, change in bowel habits, loss of appetite  Resp:  shortness of breath with exertion .  No excess mucus, no productive cough,  No non-productive cough,   No coughing up of blood.  No change in color of mucus.  No wheezing.   Skin: no rash or lesions.  GU: no dysuria, change in color of urine, no urgency or frequency.  No flank pain.  MS: joint pain .  No decreased range of motion.  No back pain.  Psych:  No change in mood or affect. No depression or anxiety.  No memory loss.  Objective:   Physical Exam General- Alert, Oriented, Affect-appropriate, Distress- none acute,   Skin- rash-none, lesions- none, excoriation- none  Lymphadenopathy- none  Head- atraumatic  Eyes- Gross vision intact, PERRLA, conjunctivae clear secretions  Ears- Normal- Hearing, canals, Tm - normal  Nose- Clear, No-Septal dev, mucus, polyps, erosion, perforation   Throat- Mallampati II , mucosa clear , drainage- none, tonsils- atrophic  Neck- flexible , trachea midline, no stridor , thyroid nl, carotid no bruit  Chest - symmetrical excursion , unlabored     Heart/CV- RRR , no murmur , no gallop  , no rub, nl s1 s2                     - JVD- none , edema- none, stasis changes- none, varices- none     Lung- clear to P&A, wheeze- none, cough- none , dullness-none, rub- none     Chest wall-  Abd- tender-no, distended-no, bowel sounds-present, HSM- no  Br/ Gen/ Rectal- Not done, not indicated  Extrem- cyanosis- none, clubbing, none, atrophy- none, strength- nl  Neuro- grossly intact to observation        Assessment & Plan:     Review of Systems     Objective:   Physical Exam        Assessment & Plan:

## 2011-02-15 NOTE — Assessment & Plan Note (Addendum)
Clinically stable on low dose prednisone. We discussed when to try again off prednisone. She will try taking it just M-W-F.

## 2011-04-27 ENCOUNTER — Ambulatory Visit (INDEPENDENT_AMBULATORY_CARE_PROVIDER_SITE_OTHER)
Admission: RE | Admit: 2011-04-27 | Discharge: 2011-04-27 | Disposition: A | Discharge status: Home / Self Care | Payer: BC Managed Care – PPO | Source: Ambulatory Visit | Attending: Internal Medicine | Admitting: Internal Medicine

## 2011-04-27 ENCOUNTER — Other Ambulatory Visit: Payer: Self-pay | Admitting: Internal Medicine

## 2011-04-27 ENCOUNTER — Telehealth: Payer: Self-pay | Admitting: Internal Medicine

## 2011-04-27 ENCOUNTER — Other Ambulatory Visit: Payer: BC Managed Care – PPO

## 2011-04-27 DIAGNOSIS — R042 Hemoptysis: Secondary | ICD-10-CM

## 2011-04-27 DIAGNOSIS — D869 Sarcoidosis, unspecified: Secondary | ICD-10-CM

## 2011-04-27 DIAGNOSIS — IMO0002 Reserved for concepts with insufficient information to code with codable children: Secondary | ICD-10-CM

## 2011-04-27 NOTE — Telephone Encounter (Signed)
PT ASKED THAT NURSE GET DR. YOUNG'S ATTENTION RE: THIS ASAP- PT IS AT WORK # AT PRESENT TIME. Nancy Villarreal

## 2011-04-27 NOTE — Telephone Encounter (Signed)
Per CDY- 1. CXR hemoptysis, other. 2. ACE level dx sarcoid. 3. Bone density scan dx Sarcoid & long term steroid use

## 2011-04-27 NOTE — Telephone Encounter (Signed)
Unable to reach pt at mobile number. Left a msg at home number. Spoke with her at work number and she says that last night she had chest tightness and coughed up approx a tsp of bright red blood and has been wheezing. She is still coughing some this morning but her mucus is clear. She says her breathing is unchanged. She would like to be seen or have something called into her pharmacy. She also wanted to know if CDY would send her for a bone density due to the Prednisone therapy all these years. Pls advise.No Known Allergies

## 2011-04-27 NOTE — Telephone Encounter (Signed)
Addressed through Results today.

## 2011-04-27 NOTE — Telephone Encounter (Signed)
PT CALLED BACK AGAIN "ANXIOUS TO HEAR FROM DR OR NURSE SOON". CALL [phone].

## 2011-04-28 ENCOUNTER — Ambulatory Visit (INDEPENDENT_AMBULATORY_CARE_PROVIDER_SITE_OTHER)
Admission: RE | Admit: 2011-04-28 | Discharge: 2011-04-28 | Disposition: A | Discharge status: Home / Self Care | Payer: BC Managed Care – PPO | Source: Ambulatory Visit | Attending: Internal Medicine | Admitting: Internal Medicine

## 2011-04-28 DIAGNOSIS — IMO0002 Reserved for concepts with insufficient information to code with codable children: Secondary | ICD-10-CM

## 2011-04-28 DIAGNOSIS — Z13820 Encounter for screening for osteoporosis: Secondary | ICD-10-CM

## 2011-04-28 DIAGNOSIS — D869 Sarcoidosis, unspecified: Secondary | ICD-10-CM

## 2011-04-28 NOTE — Telephone Encounter (Signed)
Philipp Deputy spoke with JR and confirmed that patient had been notified and knows to come in for CXR, Labs, and Bone Scan. CY will review results with patient once completed.

## 2011-04-29 ENCOUNTER — Other Ambulatory Visit: Payer: Self-pay | Admitting: *Deleted

## 2011-04-29 DIAGNOSIS — D869 Sarcoidosis, unspecified: Secondary | ICD-10-CM

## 2011-04-29 MED ORDER — PREDNISONE 20 MG PO TABS: ORAL_TABLET | ORAL | Status: DC

## 2011-05-02 ENCOUNTER — Telehealth: Payer: Self-pay | Admitting: Internal Medicine

## 2011-05-02 NOTE — Telephone Encounter (Signed)
Per CY-okay to not take Prednisone was suggested due to coughing up blood. Patient is aware and is concerned with Increased ACE level. Pt set up appt for 05-04-11 to discuss with you.

## 2011-05-02 NOTE — Telephone Encounter (Signed)
SPoke with pt and advised her of cxr results. Pt verbalized understanding and states she is not going to increase her prednisone. Per Katie send this to her

## 2011-05-02 NOTE — Telephone Encounter (Signed)
CY-pts ACE level came back as 85 and patient is refusing to increase her Prednisone. Please advise. Thanks.

## 2011-05-04 ENCOUNTER — Ambulatory Visit (INDEPENDENT_AMBULATORY_CARE_PROVIDER_SITE_OTHER): Payer: BC Managed Care – PPO | Admitting: Internal Medicine

## 2011-05-04 ENCOUNTER — Encounter: Payer: Self-pay | Admitting: Internal Medicine

## 2011-05-04 ENCOUNTER — Ambulatory Visit (INDEPENDENT_AMBULATORY_CARE_PROVIDER_SITE_OTHER): Payer: BC Managed Care – PPO

## 2011-05-04 VITALS — BP 118/84 | HR 76 | Ht 62.0 in | Wt 181.6 lb

## 2011-05-04 DIAGNOSIS — R042 Hemoptysis: Secondary | ICD-10-CM

## 2011-05-04 DIAGNOSIS — D869 Sarcoidosis, unspecified: Secondary | ICD-10-CM

## 2011-05-04 DIAGNOSIS — Z23 Encounter for immunization: Secondary | ICD-10-CM

## 2011-05-04 LAB — COMPREHENSIVE METABOLIC PANEL
CO2: 26 mEq/L (ref 19–32)
Calcium: 8.6 mg/dL (ref 8.4–10.5)
Chloride: 106 mEq/L (ref 96–112)
Creatinine, Ser: 0.7 mg/dL (ref 0.4–1.2)
GFR: 112.73 mL/min (ref >60.00)
Glucose, Bld: 84 mg/dL (ref 70–99)
Total Bilirubin: 0.2 mg/dL — ABNORMAL LOW (ref 0.3–1.2)
Total Protein: 7.9 g/dL (ref 6.0–8.3)

## 2011-05-04 NOTE — Patient Instructions (Addendum)
Recommend you increase prednisone till next visit to 10 mg every other day  Ask your HR/ Benefits person about an FMLA form   Order- lab- ACE level, C-met                    CT chest with and without contrast for dx Sarcoid                    Schedule PFT

## 2011-05-04 NOTE — Progress Notes (Signed)
Subjective:    Patient ID: Nancy Villarreal, female    DOB: [birth year], 40 y.o.   MRN: [id number]  HPI    Review of Systems     Objective:   Physical Exam        Assessment & Plan:   Subjective:    Patient ID: Nancy Villarreal, female    DOB: 04-19-1971, 40 y.o.   MRN: [id number]  HPI  Subjective:    Patient ID: Nancy Villarreal, female    DOB: 18-May-1971, 40 y.o.   MRN: [id number]  HPI 11/30/10- 89 yoF former smoker followed here with sarcoid, currently maintained on prednisone 5 mg every other day Bronch NEG 09/24/08- dx based on mediastinal adenopathy/ interstitial disease, ACE 90, response to steroids . She was put back on daily prednisone at 10 mg daily in May, 2011 for ACE 111.  Has had hemoptysis in past, allergic rhinitis and asthma. In September, 2011 was doing very well and was changed to 5 mg every other day. Felt well here January, 2012. Has had some hx asthma and allergic rhinitis. CXR 09/07/10 showed stable Stage III intersiittial scarring unchanged back to 09/11/08. In last 2 weeks has noted increased dyspnea with wheeze lying down, night sweats, joint pains, fatigue, hair falling out, frightened that she couldn't breathe, dyspnea walking parking lot. Last night propped up to sleep. Denies chest pain, palpitation, rash or nodes, leg swelling or pain.    02/15/11- Sarcoid Stage III, hx asthma, rhinitis She feels she is doing well, still maintaining on prednisone 5 mg every other day. She is comfortable with that and feels well. Admits occasional night sweat. Denies rash, cough, nodes.  We discussed how and when to wean off steroids.  05/04/11-  26 yoF former smoker followed here with sarcoid,  Bronch NEG 09/24/08- dx based on mediastinal adenopathy/ interstitial disease, ACE 90, response to steroids. She called after coughing up a mouthfull of blood on 1 night a week ago. She kept on coughing, but there was no more blood. She still coughs, back to baseline. Notes temps  up to 100 at night with some sweats, no nodes or rash. Aware of some bones aching- especially one area lateral left calf at times- nothing visible. More frequent heart burn. Some DOE. Feeling tired x 1 month- wants to sleep. Sweats and ache in shoulders affect sleep comfort.  We reviewed her ACE levels and prior xray images  and discussed her concerns about prednisone.   Review of Systems Per HPI    Constitutional:   HEENT:   No headaches,  Difficulty swallowing,  Tooth/dental problems,  Sore throat,                No sneezing, itching, ear ache, nasal congestion, post nasal drip,  CV:  + chest pain,  , PND, swelling in lower extremities, anasarca, dizziness, palpitations GI  No heartburn, indigestion, abdominal pain, nausea, vomiting, diarrhea, change in bowel habits, loss of appetite Resp:  shortness of breath with exertion .  No excess mucus, no productive cough,  + non-productive cough,  No coughing up of blood.  No change in color of mucus.  No wheezing.  Skin: no rash or lesions. GU: no dysuria, change in color of urine, no urgency or frequency.  No flank pain. MS: joint pain .  No decreased range of motion.  No back pain. Psych:  No change in mood or affect. No depression or anxiety.  No memory loss.  Objective:   Physical  Exam General- Alert, Oriented, Affect-appropriate, Distress- none acute Skin- rash-none, lesions- none, excoriation- none Lymphadenopathy- none Head- atraumatic            Eyes- Gross vision intact, PERRLA, conjunctivae clear secretions            Ears- Hearing, canals normal            Nose- Clear, No-Septal dev, mucus, polyps, erosion, perforation             Throat- Mallampati II , mucosa clear , drainage- none, tonsils- atrophic Neck- flexible , trachea midline, no stridor , thyroid nl, carotid no bruit Chest - symmetrical excursion , unlabored           Heart/CV- RRR , no murmur , no gallop  , no rub, nl s1 s2                           - JVD- none , edema-  none, stasis changes- none, varices- none           Lung- clear to P&A, wheeze- none, cough- none , dullness-none, rub- none           Chest wall-  Abd- tender-no, distended-no, bowel sounds-present, HSM- no Br/ Gen/ Rectal- Not done, not indicated Extrem- cyanosis- none, clubbing, none, atrophy- none, strength- nl Neuro- grossly intact to observation      Assessment & Plan:     Review of Systems     Objective:   Physical Exam        Assessment & Plan:

## 2011-05-04 NOTE — Assessment & Plan Note (Addendum)
Increased ACE and night sweats point to active sarcoid as the basis for recent hemoptysis, although it isn't apparent on CXR yet. We discussed FMLA and will regroup with updated labs, PFT and CT. She does not want increased steroids because of side effects including potential weight gain. I discussed alternatives and explained that referral for second opinion is available.

## 2011-05-06 ENCOUNTER — Ambulatory Visit (INDEPENDENT_AMBULATORY_CARE_PROVIDER_SITE_OTHER)
Admission: RE | Admit: 2011-05-06 | Discharge: 2011-05-06 | Disposition: A | Discharge status: Home / Self Care | Payer: BC Managed Care – PPO | Source: Ambulatory Visit | Attending: Internal Medicine | Admitting: Internal Medicine

## 2011-05-06 DIAGNOSIS — D869 Sarcoidosis, unspecified: Secondary | ICD-10-CM

## 2011-05-06 MED ORDER — IOHEXOL 300 MG/ML  SOLN
80.0000 mL | Freq: Once | INTRAMUSCULAR | Status: AC | PRN
  Administered 2011-05-06: 80 mL via INTRAVENOUS

## 2011-05-08 ENCOUNTER — Encounter: Payer: Self-pay | Admitting: Internal Medicine

## 2011-05-08 NOTE — Assessment & Plan Note (Signed)
Hemorrhagic bronchitis likely related to airway is vessel erosion. Gross cavitation has not yet seen.

## 2011-05-10 NOTE — Progress Notes (Signed)
Quick Note:  LMTCB ______ 

## 2011-05-11 ENCOUNTER — Telehealth: Payer: Self-pay | Admitting: Internal Medicine

## 2011-05-11 NOTE — Telephone Encounter (Signed)
Spoke with pt and have notified of results of ct chest and labs. Pt verbalized understanding and denied any further questions.

## 2011-05-13 NOTE — Telephone Encounter (Signed)
Called patient-wanting to know what her Bone Scan results were and advice on when next appt should be. I left a message for patient to call me back-pt aware on the message that I am in a different area working today and may not be able to speak with her when she calls back. I just need to let her know that her Scan was normal and she can keep her appt in 06-2011 unless she notices any flare ups or illness prior to then.

## 2011-05-13 NOTE — Telephone Encounter (Signed)
I spoke with patient about results and he verbalized understanding and had no questions 

## 2011-05-13 NOTE — Progress Notes (Signed)
Quick Note:  LMTCB ______ 

## 2011-05-13 NOTE — Telephone Encounter (Signed)
Pt return call to [facility]. Please cb at [phone] [place]

## 2011-05-24 NOTE — Progress Notes (Signed)
Quick Note:  Pt is aware of her Bone Scan results; see phone note. ______

## 2011-05-30 ENCOUNTER — Telehealth: Payer: Self-pay | Admitting: Internal Medicine

## 2011-05-30 NOTE — Telephone Encounter (Signed)
Spoke with pt. She states picked up rx from pharmacy at [facility] for prednisone 20 mg # 14 tablets. I advised that I do not see this in her chart and it looks like the last time they discussed this, she was to take prednisone 10 every other day, and so 14 tablets should last her a while, but I will go ahead and send new rx for her. She states that this is not needed, and she will discuss with CDY at her next appt. Pt states nothing further needed.

## 2011-06-21 ENCOUNTER — Encounter: Payer: Self-pay | Admitting: Internal Medicine

## 2011-06-21 ENCOUNTER — Ambulatory Visit (INDEPENDENT_AMBULATORY_CARE_PROVIDER_SITE_OTHER): Payer: BC Managed Care – PPO | Admitting: Internal Medicine

## 2011-06-21 ENCOUNTER — Other Ambulatory Visit: Payer: BC Managed Care – PPO

## 2011-06-21 VITALS — BP 110/72 | HR 90 | Ht 62.0 in | Wt 178.4 lb

## 2011-06-21 DIAGNOSIS — D869 Sarcoidosis, unspecified: Secondary | ICD-10-CM

## 2011-06-21 MED ORDER — PREDNISONE 5 MG PO TABS: ORAL_TABLET | ORAL | Status: AC

## 2011-06-21 NOTE — Patient Instructions (Addendum)
Order- lab- ACE level  We will decide what to do with prednisone dose after we get the ACE level  Script sent for prednisone

## 2011-06-21 NOTE — Progress Notes (Signed)
Patient ID: Nancy Villarreal, female    DOB: 21-Sep-1970, 40 y.o.   MRN: [id number]  HPI 11/30/10- 77 yoF former smoker followed here with sarcoid, currently maintained on prednisone 5 mg every other day Bronch NEG 09/24/08- dx based on mediastinal adenopathy/ interstitial disease, ACE 90, response to steroids . She was put back on daily prednisone at 10 mg daily in May, 2011 for ACE 111.  Has had hemoptysis in past, allergic rhinitis and asthma. In September, 2011 was doing very well and was changed to 5 mg every other day. Felt well here January, 2012. Has had some hx asthma and allergic rhinitis. CXR 09/07/10 showed stable Stage III intersiittial scarring unchanged back to 09/11/08. In last 2 weeks has noted increased dyspnea with wheeze lying down, night sweats, joint pains, fatigue, hair falling out, frightened that she couldn't breathe, dyspnea walking parking lot. Last night propped up to sleep. Denies chest pain, palpitation, rash or nodes, leg swelling or pain.    02/15/11- Sarcoid Stage III, hx asthma, rhinitis She feels she is doing well, still maintaining on prednisone 5 mg every other day. She is comfortable with that and feels well. Admits occasional night sweat. Denies rash, cough, nodes.  We discussed how and when to wean off steroids.  05/04/11-  57 yoF former smoker followed here with sarcoid,  Bronch NEG 09/24/08- dx based on mediastinal adenopathy/ interstitial disease, ACE 90, response to steroids. She called after coughing up a mouthfull of blood on 1 night a week ago. She kept on coughing, but there was no more blood. She still coughs, back to baseline. Notes temps up to 100 at night with some sweats, no nodes or rash. Aware of some bones aching- especially one area lateral left calf at times- nothing visible. More frequent heart burn. Some DOE. Feeling tired x 1 month- wants to sleep. Sweats and ache in shoulders affect sleep comfort.  We reviewed her ACE levels and prior xray images   and discussed her concerns about prednisone.  06/21/11- 7 yoF former smoker followed here with sarcoid,  Bronch NEG 09/24/08- dx based on mediastinal adenopathy/ interstitial disease, ACE 90, response to steroid. She says she feels fine and has had her flu vaccine. Now exercising and lost 5 pounds. She has continued prednisone 10 mg every other day since last here. We reviewed recent labs including bone density which was within normal, and CT scan noted below. Bone Density- Normal- 04/27/11 IMPRESSION: CT CHEST 05/06/11 Upper lobe predominant fibrosis with calcified mediastinal and  bilateral hilar lymph nodes, compatible with known sarcoidosis.  Scattered mild tree-in-bud nodules in the right lower lobe, likely  related to sarcoid, mild superimposed infectious process not  excluded.  Original Report Authenticated By: Charline Bills, M.D.      Review of Systems Per HPI    Constitutional:   HEENT:   No headaches,  Difficulty swallowing,  Tooth/dental problems,  Sore throat,                No sneezing, itching, ear ache, nasal congestion, post nasal drip,  CV:  No chest pain,  PND, swelling in lower extremities, anasarca, dizziness, palpitations GI  No heartburn, indigestion, abdominal pain, nausea, vomiting, diarrhea, change in bowel habits, loss of appetite Resp:  No acute shortness of breath with exertion .  No excess mucus, no productive cough,  + non-productive cough,  No coughing up of blood.  No change in color of mucus.  No wheezing.  Skin: no rash  or lesions. GU: no dysuria, change in color of urine, no urgency or frequency.  No flank pain. MS: joint pain .  No decreased range of motion.  No back pain. Psych:  No change in mood or affect. No depression or anxiety.  No memory loss.  Objective:   Physical Exam General- Alert, Oriented, Affect-appropriate, Distress- none acute Skin- acne on back, lesions- none, excoriation- none Lymphadenopathy- none Head- atraumatic             Eyes- Gross vision intact, PERRLA, conjunctivae clear secretions            Ears- Hearing, canals normal            Nose- Clear, No-Septal dev, mucus, polyps, erosion, perforation             Throat- Mallampati II , mucosa clear , drainage- none, tonsils- atrophic Neck- flexible , trachea midline, no stridor , thyroid nl, carotid no bruit Chest - symmetrical excursion , unlabored           Heart/CV- RRR , no murmur , no gallop  , no rub, nl s1 s2                           - JVD- none , edema- none, stasis changes- none, varices- none           Lung- there was a raspy quality to occasional cough but otherwise chest was quiet., wheeze- none,  dullness-none, rub- none           Chest wall-  Abd- tender-no, distended-no, bowel sounds-present, HSM- no Br/ Gen/ Rectal- Not done, not indicated Extrem- cyanosis- none, clubbing, none, atrophy- none, strength- nl Neuro- grossly intact to observation

## 2011-06-24 NOTE — Progress Notes (Signed)
Quick Note:  LMTCB ______ 

## 2011-06-24 NOTE — Assessment & Plan Note (Signed)
Based on the persistently elevated ACE level I do not suggest reducing her prednisone. When we can reduce, change will need to be made slowly.

## 2011-07-14 ENCOUNTER — Ambulatory Visit (INDEPENDENT_AMBULATORY_CARE_PROVIDER_SITE_OTHER): Payer: BC Managed Care – PPO | Admitting: Internal Medicine

## 2011-07-14 DIAGNOSIS — R0602 Shortness of breath: Secondary | ICD-10-CM

## 2011-07-14 DIAGNOSIS — D869 Sarcoidosis, unspecified: Secondary | ICD-10-CM

## 2011-07-14 LAB — PULMONARY FUNCTION TEST

## 2011-07-14 NOTE — Progress Notes (Signed)
PFT done today. 

## 2011-07-21 ENCOUNTER — Encounter: Payer: Self-pay | Admitting: Internal Medicine

## 2011-09-22 ENCOUNTER — Ambulatory Visit (INDEPENDENT_AMBULATORY_CARE_PROVIDER_SITE_OTHER): Payer: BC Managed Care – PPO | Admitting: Internal Medicine

## 2011-09-22 ENCOUNTER — Other Ambulatory Visit: Payer: BC Managed Care – PPO

## 2011-09-22 ENCOUNTER — Encounter: Payer: Self-pay | Admitting: Internal Medicine

## 2011-09-22 VITALS — BP 110/72 | HR 113 | Ht 62.0 in | Wt 180.2 lb

## 2011-09-22 DIAGNOSIS — D869 Sarcoidosis, unspecified: Secondary | ICD-10-CM

## 2011-09-22 NOTE — Progress Notes (Signed)
Patient ID: Nancy Villarreal, female    DOB: 1971-05-09, 41 y.o.   MRN: [id number]  HPI 11/30/10- 41 yoF former smoker followed here with sarcoid, currently maintained on prednisone 5 mg every other day Bronch NEG 09/24/08- dx based on mediastinal adenopathy/ interstitial disease, ACE 90, response to steroids . She was put back on daily prednisone at 10 mg daily in May, 2011 for ACE 111.  Has had hemoptysis in past, allergic rhinitis and asthma. In September, 2011 was doing very well and was changed to 5 mg every other day. Felt well here January, 2012. Has had some hx asthma and allergic rhinitis. CXR 09/07/10 showed stable Stage III intersiittial scarring unchanged back to 09/11/08. In last 2 weeks has noted increased dyspnea with wheeze lying down, night sweats, joint pains, fatigue, hair falling out, frightened that she couldn't breathe, dyspnea walking parking lot. Last night propped up to sleep. Denies chest pain, palpitation, rash or nodes, leg swelling or pain.    02/15/11- Sarcoid Stage III, hx asthma, rhinitis She feels she is doing well, still maintaining on prednisone 5 mg every other day. She is comfortable with that and feels well. Admits occasional night sweat. Denies rash, cough, nodes.  We discussed how and when to wean off steroids.  05/04/11-  41 yoF former smoker followed here with sarcoid,  Bronch NEG 09/24/08- dx based on mediastinal adenopathy/ interstitial disease, ACE 90, response to steroids. She called after coughing up a mouthfull of blood on 1 night a week ago. She kept on coughing, but there was no more blood. She still coughs, back to baseline. Notes temps up to 100 at night with some sweats, no nodes or rash. Aware of some bones aching- especially one area lateral left calf at times- nothing visible. More frequent heart burn. Some DOE. Feeling tired x 1 month- wants to sleep. Sweats and ache in shoulders affect sleep comfort.  We reviewed her ACE levels and prior xray images   and discussed her concerns about prednisone.  06/21/11- 41 yoF former smoker followed here with sarcoid,  Bronch NEG 09/24/08- dx based on mediastinal adenopathy/ interstitial disease, ACE 90, response to steroid. She says she feels fine and has had her flu vaccine. Now exercising and lost 5 pounds. She has continued prednisone 10 mg every other day since last here. We reviewed recent labs including bone density which was within normal, and CT scan noted below. Bone Density- Normal- 04/27/11 IMPRESSION: CT CHEST 05/06/11 Upper lobe predominant fibrosis with calcified mediastinal and  bilateral hilar lymph nodes, compatible with known sarcoidosis.  Scattered mild tree-in-bud nodules in the right lower lobe, likely  related to sarcoid, mild superimposed infectious process not  excluded.  Original Report Authenticated By: Charline Bills, M.D.   09/22/11-  41 yoF former smoker followed here with sarcoid Stage III,  Bronch NEG 09/24/08- dx based on mediastinal adenopathy/ interstitial disease, ACE 90, response to steroid. Has continued taking prednisone 5 mg every other day. Feels well except for dry skin. Coughs only when she exercises. Denies fever or sweat. PFT: 07/14/2011-normal spirometry flows, slight response to bronchodilator, normal lung volumes, diffusion moderately reduced. FEV1/FVC 0.84, DLCO 55%. We reviewed again the last CT scan and I printed a representative imaged for her to show family.  Review of Systems-see HPI    Constitutional:   HEENT:   No headaches,  Difficulty swallowing,  Tooth/dental problems,  Sore throat,  No sneezing, itching, ear ache, nasal congestion, post nasal drip,  CV:  No chest pain,  PND, swelling in lower extremities, anasarca, dizziness, palpitations GI  No heartburn, indigestion, abdominal pain, nausea, vomiting, diarrhea, change in bowel habits, loss of appetite Resp:  No acute shortness of breath with exertion .  No excess mucus, no  productive cough,  + non-productive cough,  No coughing up of blood.  No change in color of mucus.  No wheezing.  Skin: no rash or lesions. GU: MS: joint pain .  No decreased range of motion.  No back pain. Psych:  No change in mood or affect. No depression or anxiety.  No memory loss.  Objective:   Physical Exam General- Alert, Oriented, Affect-appropriate, Distress- none acute Skin- acne on back, lesions- none, excoriation- none Lymphadenopathy- none Head- atraumatic            Eyes- Gross vision intact, PERRLA, conjunctivae clear secretions            Ears- Hearing, canals normal            Nose- Clear, No-Septal dev, mucus, polyps, erosion, perforation             Throat- Mallampati II , mucosa clear , drainage- none, tonsils- atrophic Neck- flexible , trachea midline, no stridor , thyroid nl, carotid no bruit Chest - symmetrical excursion , unlabored           Heart/CV- RRR , no murmur , no gallop  , no rub, nl s1 s2                           - JVD- none , edema- none, stasis changes- none, varices- none           Lung- there was a raspy quality to occasional cough but otherwise chest was quiet., wheeze- none,  dullness-none, rub- none           Chest wall-  Abd- tender-no, distended-no, bowel sounds-present, HSM- no Br/ Gen/ Rectal- Not done, not indicated Extrem- cyanosis- none, clubbing, none, atrophy- none, strength- nl Neuro- grossly intact to observation

## 2011-09-22 NOTE — Patient Instructions (Signed)
Continue taking 5 mg prednisone = 1 tab every other day  Order-  ACE level    Dx sarcoid  Please call as needed

## 2011-09-23 NOTE — Progress Notes (Signed)
Quick Note:  Pt aware of results. ______ 

## 2011-09-25 NOTE — Assessment & Plan Note (Signed)
Continues maintenance prednisone at 5 mg daily. She is asymptomatic now but had minor inflammatory changes on her last CT and with persistent elevation of ACE level, I think low dose prednisone as a good idea. She has considerable scarring on her images.

## 2011-10-17 ENCOUNTER — Ambulatory Visit (INDEPENDENT_AMBULATORY_CARE_PROVIDER_SITE_OTHER)
Admission: RE | Admit: 2011-10-17 | Discharge: 2011-10-17 | Disposition: A | Discharge status: Home / Self Care | Payer: BC Managed Care – PPO | Source: Ambulatory Visit | Attending: Internal Medicine | Admitting: Internal Medicine

## 2011-10-17 ENCOUNTER — Telehealth: Payer: Self-pay | Admitting: Internal Medicine

## 2011-10-17 DIAGNOSIS — R042 Hemoptysis: Secondary | ICD-10-CM

## 2011-10-17 MED ORDER — AMOXICILLIN-POT CLAVULANATE 875-125 MG PO TABS: 1.0000 | ORAL_TABLET | Freq: Two times a day (BID) | ORAL | Status: AC

## 2011-10-17 NOTE — Telephone Encounter (Signed)
Per CY-first try Augmentin 875mg  #14 take 1 po bid no refills and if no better then increased prednisone as initially suggested.

## 2011-10-17 NOTE — Telephone Encounter (Signed)
Spoke with patient-she states she was having slight wheezing on Saturday around 4am and then had Bright Red Blood (1/2cup) coughed up. She is no longer coughing today but is having center chest pain. Patients last CXR was in 04-2011. Please advise.

## 2011-10-17 NOTE — Telephone Encounter (Signed)
Called and spoke with pt. Pt is requesting cxr results from today....  The following is CY's documentation regarding the CXR (which is documented under the cxr report in imaging tab).... CXR- slight generalized progression of sarcoid changes. This is also likely cause of bleeding. If she were running fever and coughing infected phlegm, I would try an antibiotic. Without those clues, i would like her change her prednisone  from 2 x5mg  every other day, to 2 Every day, till I see her in May. Otherwise we can see her earlier.   Called and spoke with pt. Informed her of the above information.  Pt states she ran a slight temp on Saturday and Sunday ( but did not actually take her temp- stated she just new she had a temp)  Pt states she also coughed up discolored sputum on Sat and Sunday.  CY, please advise if you have any more recs for pt.   Thanks!  No Known Allergies

## 2011-10-17 NOTE — Telephone Encounter (Signed)
Called and spoke with pt and informed her of CY's response/recs.  Pt states she will try the Augmentin first and then if no better will increase pred.  rx sent to pharmacy.

## 2011-10-17 NOTE — Telephone Encounter (Signed)
Per CY-okay to have patient come by and do OUTPT CXR STAT-dx hemoptysis, unspecified. Pt is aware and will come by office today.

## 2011-10-17 NOTE — Telephone Encounter (Signed)
I spoke with pt and advised her I did not see anything where Florentina Addison tried to call pt. Pt then stated she would like to speak with katie directly and would not elaborate on what just asked to get the message to [place]. Please advise Florentina Addison thanks

## 2011-12-27 ENCOUNTER — Telehealth: Payer: Self-pay | Admitting: Internal Medicine

## 2011-12-27 NOTE — Telephone Encounter (Signed)
Spoke with patient-appt scheduled for Thursday 12-29-11 at 1130am with CY.

## 2011-12-29 ENCOUNTER — Ambulatory Visit (INDEPENDENT_AMBULATORY_CARE_PROVIDER_SITE_OTHER): Payer: BC Managed Care – PPO | Admitting: Internal Medicine

## 2011-12-29 ENCOUNTER — Telehealth: Payer: Self-pay | Admitting: Internal Medicine

## 2011-12-29 ENCOUNTER — Encounter: Payer: Self-pay | Admitting: Internal Medicine

## 2011-12-29 ENCOUNTER — Other Ambulatory Visit (INDEPENDENT_AMBULATORY_CARE_PROVIDER_SITE_OTHER): Payer: BC Managed Care – PPO

## 2011-12-29 VITALS — BP 108/72 | HR 96 | Ht 62.0 in | Wt 183.6 lb

## 2011-12-29 DIAGNOSIS — E039 Hypothyroidism, unspecified: Secondary | ICD-10-CM

## 2011-12-29 DIAGNOSIS — D869 Sarcoidosis, unspecified: Secondary | ICD-10-CM

## 2011-12-29 LAB — CBC WITH DIFFERENTIAL/PLATELET
Basophils Relative: 0 % (ref 0.0–3.0)
Eosinophils Absolute: 0 10*3/uL (ref 0.0–0.7)
MCHC: 31.4 g/dL (ref 30.0–36.0)
MCV: 73.5 fl — ABNORMAL LOW (ref 78.0–100.0)
Monocytes Absolute: 0.5 10*3/uL (ref 0.1–1.0)
Neutrophils Relative %: 77.7 % — ABNORMAL HIGH (ref 43.0–77.0)
Platelets: 208 10*3/uL (ref 150.0–400.0)
RDW: 16.9 % — ABNORMAL HIGH (ref 11.5–14.6)

## 2011-12-29 LAB — COMPREHENSIVE METABOLIC PANEL
ALT: 14 U/L (ref 0–35)
AST: 20 U/L (ref 0–37)
Alkaline Phosphatase: 46 U/L (ref 39–117)
BUN: 9 mg/dL (ref 6–23)
Creatinine, Ser: 0.7 mg/dL (ref 0.4–1.2)

## 2011-12-29 NOTE — Patient Instructions (Signed)
Order- lab- CBC w/ diff, CMET, ACE level-    Dx Sarcoid          TSH-  Dx hypothyroid

## 2011-12-29 NOTE — Telephone Encounter (Signed)
Ok for patient to be seen in June; cancel May appt please.

## 2011-12-29 NOTE — Telephone Encounter (Signed)
Done. Pt aware. Nancy Villarreal

## 2011-12-29 NOTE — Progress Notes (Signed)
Patient ID: Nancy FriendlyFelicia D Villarreal, female    DOB: 05/23/1971, 41 y.o.   MRN: [id number]  HPI 11/30/10- [age] yoF former smoker followed here with sarcoid, currently maintained on prednisone 5 mg every other day Bronch NEG 09/24/08- dx based on mediastinal adenopathy/ interstitial disease, ACE 90, response to steroids . She was put back on daily prednisone at 10 mg daily in May, 2011 for ACE 111.  Has had hemoptysis in past, allergic rhinitis and asthma. In September, 2011 was doing very well and was changed to 5 mg every other day. Felt well here January, 2012. Has had some hx asthma and allergic rhinitis. CXR 09/07/10 showed stable Stage III intersiittial scarring unchanged back to 09/11/08. In last 2 weeks has noted increased dyspnea with wheeze lying down, night sweats, joint pains, fatigue, hair falling out, frightened that she couldn't breathe, dyspnea walking parking lot. Last night propped up to sleep. Denies chest pain, palpitation, rash or nodes, leg swelling or pain.    02/15/11- Sarcoid Stage III, hx asthma, rhinitis She feels she is doing well, still maintaining on prednisone 5 mg every other day. She is comfortable with that and feels well. Admits occasional night sweat. Denies rash, cough, nodes.  We discussed how and when to wean off steroids.  05/04/11-  [age] yoF former smoker followed here with sarcoid,  Bronch NEG 09/24/08- dx based on mediastinal adenopathy/ interstitial disease, ACE 90, response to steroids. She called after coughing up a mouthfull of blood on 1 night a week ago. She kept on coughing, but there was no more blood. She still coughs, back to baseline. Notes temps up to 100 at night with some sweats, no nodes or rash. Aware of some bones aching- especially one area lateral left calf at times- nothing visible. More frequent heart burn. Some DOE. Feeling tired x 1 month- wants to sleep. Sweats and ache in shoulders affect sleep comfort.  We reviewed her ACE levels and prior xray images   and discussed her concerns about prednisone.  06/21/11- [age] yoF former smoker followed here with sarcoid,  Bronch NEG 09/24/08- dx based on mediastinal adenopathy/ interstitial disease, ACE 90, response to steroid. She says she feels fine and has had her flu vaccine. Now exercising and lost 5 pounds. She has continued prednisone 10 mg every other day since last here. We reviewed recent labs including bone density which was within normal, and CT scan noted below. Bone Density- Normal- 04/27/11 IMPRESSION: CT CHEST 05/06/11 Upper lobe predominant fibrosis with calcified mediastinal and  bilateral hilar lymph nodes, compatible with known sarcoidosis.  Scattered mild tree-in-bud nodules in the right lower lobe, likely  related to sarcoid, mild superimposed infectious process not  excluded.  Original Report Authenticated By: Charline BillsSRIYESH KRISHNAN, M.D.   09/22/11-  [age] yoF former smoker followed here with sarcoid Stage III,  Bronch NEG 09/24/08- dx based on mediastinal adenopathy/ interstitial disease, ACE 90, response to steroid. Has continued taking prednisone 5 mg every other day. Feels well except for dry skin. Coughs only when she exercises. Denies fever or sweat. PFT: 07/14/2011-normal spirometry flows, slight response to bronchodilator, normal lung volumes, diffusion moderately reduced. FEV1/FVC 0.84, DLCO 55%. We reviewed again the last CT scan and I printed a representative imaged for her to show family.  12/29/11-  [age] yoF former smoker followed here with sarcoid Stage III,  Bronch NEG 09/24/08- dx based on mediastinal adenopathy/ interstitial disease, ACE 90, response to steroid. Her dentist noticed hyperpigmentation on the hard palate. She  complains of fatigue despite adequate sleep. "No energy" not really sleepiness. Denies fever, weight loss, adenopathy, pain. Some increased dry cough without wheeze. She has continued prednisone 5 mg daily. Off of her inhaler "lost it". ACE: 3 months 62, 6 months 67,  7 months 92, 8 months 55, 1 year 54.  Review of Systems-see HPI    Constitutional:  Per HPI HEENT:   No headaches,  Difficulty swallowing,  Tooth/dental problems,  Sore throat,                No sneezing, itching, ear ache, nasal congestion, post nasal drip,  CV:  No chest pain,  PND, swelling in lower extremities, anasarca, dizziness, palpitations GI  No heartburn, indigestion, abdominal pain, nausea, vomiting, diarrhea, Resp:  No acute shortness of breath with exertion .  No excess mucus, no productive cough,  + non-productive cough,  No coughing up of blood.  No change in color of mucus.  No wheezing.  Skin: no rash or lesions. GU: MS: joint pain .  No decreased range of motion.  No back pain. Psych:  No change in mood or affect. No depression or anxiety.  No memory loss.  Objective:   Physical Exam General- Alert, Oriented, Affect-appropriate, Distress- none acute Skin- acne on back, lesions- none, excoriation- none Lymphadenopathy- none Head- atraumatic            Eyes- Gross vision intact, PERRLA, conjunctivae clear secretions            Ears- Hearing, canals normal            Nose- Clear, No-Septal dev, mucus, polyps, erosion, perforation             Throat- Mallampati II , mucosa clear , drainage- none, tonsils- atrophic. Hyperpigmentation on hard palate. Neck- flexible , trachea midline, no stridor , thyroid nl, carotid no bruit Chest - symmetrical excursion , unlabored           Heart/CV- RRR , no murmur , no gallop  , no rub, nl s1 s2                           - JVD- none , edema- none, stasis changes- none, varices- none           Lung-  chest was quiet., wheeze- none,  dullness-none, rub- none           Chest wall-  Abd-  Br/ Gen/ Rectal- Not done, not indicated Extrem- cyanosis- none, clubbing, none, atrophy- none, strength- nl Neuro- grossly intact to observation

## 2011-12-30 LAB — ANGIOTENSIN CONVERTING ENZYME: Angiotensin-Converting Enzyme: 68 U/L — ABNORMAL HIGH (ref 8–52)

## 2012-01-01 ENCOUNTER — Encounter: Payer: Self-pay | Admitting: Internal Medicine

## 2012-01-01 NOTE — Assessment & Plan Note (Signed)
5 mg daily prednisone made not be quite enough to meet physiologic needs. This could explain her fatigue and the hyperpigmentation on her palate. We need to determine how active sarcoid is. Plan- CBC, chemistry panel, TSH, ACE level

## 2012-01-02 ENCOUNTER — Other Ambulatory Visit: Payer: Self-pay | Admitting: Internal Medicine

## 2012-01-02 DIAGNOSIS — D649 Anemia, unspecified: Secondary | ICD-10-CM

## 2012-01-02 NOTE — Progress Notes (Signed)
Quick Note:  Pt is aware of results and will come by the office on May 1,2013 to have lab work done as well as pick up stool cards. ______

## 2012-01-04 ENCOUNTER — Other Ambulatory Visit: Payer: Self-pay | Admitting: *Deleted

## 2012-01-04 ENCOUNTER — Other Ambulatory Visit: Payer: Self-pay | Admitting: Internal Medicine

## 2012-01-04 ENCOUNTER — Ambulatory Visit: Payer: BC Managed Care – PPO

## 2012-01-04 DIAGNOSIS — D649 Anemia, unspecified: Secondary | ICD-10-CM

## 2012-01-04 LAB — IBC PANEL: Transferrin: 282.1 mg/dL (ref 212.0–360.0)

## 2012-01-05 ENCOUNTER — Ambulatory Visit: Payer: BC Managed Care – PPO

## 2012-01-05 DIAGNOSIS — D649 Anemia, unspecified: Secondary | ICD-10-CM

## 2012-01-06 NOTE — Progress Notes (Signed)
Quick Note:  Pt aware of results. ______ 

## 2012-01-18 ENCOUNTER — Other Ambulatory Visit: Payer: BC Managed Care – PPO

## 2012-01-18 LAB — HEMOCCULT SLIDES (X 3 CARDS)
OCCULT 1: NEGATIVE
OCCULT 2: NEGATIVE
OCCULT 3: NEGATIVE
OCCULT 4: NEGATIVE
OCCULT 5: NEGATIVE

## 2012-01-19 ENCOUNTER — Ambulatory Visit: Payer: BC Managed Care – PPO | Admitting: Internal Medicine

## 2012-01-19 NOTE — Progress Notes (Signed)
Quick Note:  Pt aware of results. ______ 

## 2012-02-07 DIAGNOSIS — D500 Iron deficiency anemia secondary to blood loss (chronic): Secondary | ICD-10-CM

## 2012-02-10 ENCOUNTER — Encounter: Payer: Self-pay | Admitting: Internal Medicine

## 2012-02-10 ENCOUNTER — Ambulatory Visit (INDEPENDENT_AMBULATORY_CARE_PROVIDER_SITE_OTHER): Payer: BC Managed Care – PPO | Admitting: Internal Medicine

## 2012-02-10 VITALS — BP 118/64 | HR 71 | Ht 62.0 in | Wt 180.0 lb

## 2012-02-10 DIAGNOSIS — D509 Iron deficiency anemia, unspecified: Secondary | ICD-10-CM

## 2012-02-10 DIAGNOSIS — D869 Sarcoidosis, unspecified: Secondary | ICD-10-CM

## 2012-02-10 NOTE — Patient Instructions (Addendum)
Continue daily iron supplement.   Add Vitamin C daily- 1 tab  Continue prednisone 5 mg daily  Continue your regular walking.  Order- [facility] primary care referral to establish.

## 2012-02-10 NOTE — Progress Notes (Signed)
Patient ID: Nancy Villarreal, female    DOB: 05/23/1971, 41 y.o.   MRN: [id number]  HPI 11/30/10- [age] yoF former smoker followed here with sarcoid, currently maintained on prednisone 5 mg every other day Bronch NEG 09/24/08- dx based on mediastinal adenopathy/ interstitial disease, ACE 90, response to steroids . She was put back on daily prednisone at 10 mg daily in May, 2011 for ACE 111.  Has had hemoptysis in past, allergic rhinitis and asthma. In September, 2011 was doing very well and was changed to 5 mg every other day. Felt well here January, 2012. Has had some hx asthma and allergic rhinitis. CXR 09/07/10 showed stable Stage III intersiittial scarring unchanged back to 09/11/08. In last 2 weeks has noted increased dyspnea with wheeze lying down, night sweats, joint pains, fatigue, hair falling out, frightened that she couldn't breathe, dyspnea walking parking lot. Last night propped up to sleep. Denies chest pain, palpitation, rash or nodes, leg swelling or pain.    02/15/11- Sarcoid Stage III, hx asthma, rhinitis She feels she is doing well, still maintaining on prednisone 5 mg every other day. She is comfortable with that and feels well. Admits occasional night sweat. Denies rash, cough, nodes.  We discussed how and when to wean off steroids.  05/04/11-  [age] yoF former smoker followed here with sarcoid,  Bronch NEG 09/24/08- dx based on mediastinal adenopathy/ interstitial disease, ACE 90, response to steroids. She called after coughing up a mouthfull of blood on 1 night a week ago. She kept on coughing, but there was no more blood. She still coughs, back to baseline. Notes temps up to 100 at night with some sweats, no nodes or rash. Aware of some bones aching- especially one area lateral left calf at times- nothing visible. More frequent heart burn. Some DOE. Feeling tired x 1 month- wants to sleep. Sweats and ache in shoulders affect sleep comfort.  We reviewed her ACE levels and prior xray images   and discussed her concerns about prednisone.  06/21/11- [age] yoF former smoker followed here with sarcoid,  Bronch NEG 09/24/08- dx based on mediastinal adenopathy/ interstitial disease, ACE 90, response to steroid. She says she feels fine and has had her flu vaccine. Now exercising and lost 5 pounds. She has continued prednisone 10 mg every other day since last here. We reviewed recent labs including bone density which was within normal, and CT scan noted below. Bone Density- Normal- 04/27/11 IMPRESSION: CT CHEST 05/06/11 Upper lobe predominant fibrosis with calcified mediastinal and  bilateral hilar lymph nodes, compatible with known sarcoidosis.  Scattered mild tree-in-bud nodules in the right lower lobe, likely  related to sarcoid, mild superimposed infectious process not  excluded.  Original Report Authenticated By: Charline BillsSRIYESH KRISHNAN, M.D.   09/22/11-  [age] yoF former smoker followed here with sarcoid Stage III,  Bronch NEG 09/24/08- dx based on mediastinal adenopathy/ interstitial disease, ACE 90, response to steroid. Has continued taking prednisone 5 mg every other day. Feels well except for dry skin. Coughs only when she exercises. Denies fever or sweat. PFT: 07/14/2011-normal spirometry flows, slight response to bronchodilator, normal lung volumes, diffusion moderately reduced. FEV1/FVC 0.84, DLCO 55%. We reviewed again the last CT scan and I printed a representative imaged for her to show family.  12/29/11-  [age] yoF former smoker followed here with sarcoid Stage III,  Bronch NEG 09/24/08- dx based on mediastinal adenopathy/ interstitial disease, ACE 90, response to steroid. Her dentist noticed hyperpigmentation on the hard palate. She  complains of fatigue despite adequate sleep. "No energy" not really sleepiness. Denies fever, weight loss, adenopathy, pain. Some increased dry cough without wheeze. She has continued prednisone 5 mg daily. Off of her inhaler "lost it". ACE: 3 months 62, 6 months 67,  7 months 92, 8 months 55, 1 year 54.  02/10/12-  40 yoF former smoker followed here with sarcoid Stage III,  Bronch NEG 09/24/08- dx based on mediastinal adenopathy/ interstitial disease, ACE 90, response to steroid. Iron def anemia SOB and wheezing with cough and congestion comes and goes. Labs from 01/04/2012 included iron low/28, transferrin normal /282. No overt blood loss. She is now taking iron and as directed to discuss this with a primary physician. She needs to establish so we will refer her. She no longer feels run down. "Just a little" chest congestion blamed on the weather. Occasional night sweat possibly from hormones. No adenopathy  Review of Systems-see HPI    Constitutional:  Per HPI HEENT:   No headaches,  Difficulty swallowing,  Tooth/dental problems,  Sore throat,                No sneezing, itching, ear ache, nasal congestion, post nasal drip,  CV:  No chest pain,  PND, swelling in lower extremities, anasarca, dizziness, palpitations GI  No heartburn, indigestion, abdominal pain, nausea, vomiting,  Resp:  No acute shortness of breath with exertion .  No excess mucus, no productive cough,  + non-productive cough,  No coughing up of blood.  No change in color of mucus.  No wheezing.  Skin: no rash or lesions. GU: MS: joint pain .  No decreased range of motion.  No back pain. Psych:  No change in mood or affect. No depression or anxiety.  No memory loss.  Objective:   Physical Exam General- Alert, Oriented, Affect-appropriate, Distress- none acute. Looks well. Skin- acne on back, lesions- none, excoriation- none Lymphadenopathy- none Head- atraumatic            Eyes- Gross vision intact, PERRLA, conjunctivae clear secretions            Ears- Hearing, canals normal            Nose- Clear, No-Septal dev, mucus, polyps, erosion, perforation             Throat- Mallampati II , mucosa clear , drainage- none, tonsils- atrophic. Hyperpigmentation on hard palate. Neck- flexible ,  trachea midline, no stridor , thyroid nl, carotid no bruit Chest - symmetrical excursion , unlabored           Heart/CV- RRR , no murmur , no gallop  , no rub, nl s1 s2                           - JVD- none , edema- none, stasis changes- none, varices- none           Lung-  chest clear, unlabored, wheeze- none,  dullness-none, rub- none           Chest wall-  Abd-  Br/ Gen/ Rectal- Not done, not indicated Extrem- cyanosis- none, clubbing, none, atrophy- none, strength- nl Neuro- grossly intact to observation

## 2012-02-18 NOTE — Assessment & Plan Note (Signed)
She continues maintenance prednisone 5 mg daily. We don't want to have to go higher. We have discussed side effects on bones, adrenal function , resistance to infection.

## 2012-02-18 NOTE — Assessment & Plan Note (Signed)
Needs primary physician to manage this. Plan-primary care referral

## 2012-03-07 ENCOUNTER — Ambulatory Visit (INDEPENDENT_AMBULATORY_CARE_PROVIDER_SITE_OTHER): Payer: BC Managed Care – PPO | Admitting: Internal Medicine

## 2012-03-07 ENCOUNTER — Encounter: Payer: Self-pay | Admitting: Internal Medicine

## 2012-03-07 ENCOUNTER — Other Ambulatory Visit (INDEPENDENT_AMBULATORY_CARE_PROVIDER_SITE_OTHER): Payer: BC Managed Care – PPO

## 2012-03-07 VITALS — BP 102/60 | HR 83 | Temp 98.7°F | Resp 16 | Ht 62.0 in | Wt 180.0 lb

## 2012-03-07 DIAGNOSIS — D509 Iron deficiency anemia, unspecified: Secondary | ICD-10-CM

## 2012-03-07 DIAGNOSIS — Z1231 Encounter for screening mammogram for malignant neoplasm of breast: Secondary | ICD-10-CM | POA: Insufficient documentation

## 2012-03-07 DIAGNOSIS — Z23 Encounter for immunization: Secondary | ICD-10-CM

## 2012-03-07 LAB — CBC WITH DIFFERENTIAL/PLATELET
Basophils Absolute: 0 10*3/uL (ref 0.0–0.1)
Basophils Relative: 0.3 % (ref 0.0–3.0)
Eosinophils Absolute: 0.1 10*3/uL (ref 0.0–0.7)
Hemoglobin: 11.9 g/dL — ABNORMAL LOW (ref 12.0–15.0)
Lymphocytes Relative: 24.8 % (ref 12.0–46.0)
MCHC: 31.9 g/dL (ref 30.0–36.0)
Monocytes Relative: 11.7 % (ref 3.0–12.0)
Neutro Abs: 2 10*3/uL (ref 1.4–7.7)
Neutrophils Relative %: 60.6 % (ref 43.0–77.0)
RBC: 4.74 Mil/uL (ref 3.87–5.11)
RDW: 19 % — ABNORMAL HIGH (ref 11.5–14.6)

## 2012-03-07 LAB — FOLATE: Folate: 23.5 ng/mL

## 2012-03-07 LAB — IBC PANEL
Iron: 63 ug/dL (ref 42–145)
Saturation Ratios: 18.7 % — ABNORMAL LOW (ref 20.0–50.0)

## 2012-03-07 LAB — VITAMIN B12: Vitamin B-12: 403 pg/mL (ref 211–911)

## 2012-03-07 LAB — FERRITIN: Ferritin: 7.7 ng/mL — ABNORMAL LOW (ref 10.0–291.0)

## 2012-03-07 NOTE — Progress Notes (Signed)
  Subjective:    Patient ID: Nancy Villarreal, female    DOB: 03-07-1971, 41 y.o.   MRN: [id number]  Anemia Presents for follow-up visit. Symptoms include malaise/fatigue. There has been no abdominal pain, anorexia, bruising/bleeding easily, confusion, fever, leg swelling, light-headedness, pallor, palpitations, paresthesias, pica or weight loss. Signs of blood loss that are present include menorrhagia. Signs of blood loss that are not present include hematemesis, hematochezia and melena. There are no compliance problems.  Compliance with medications is 51-75%.      Review of Systems  Constitutional: Positive for malaise/fatigue. Negative for fever and weight loss.  HENT: Negative.   Eyes: Negative.   Respiratory: Negative.   Cardiovascular: Negative.  Negative for palpitations.  Gastrointestinal: Negative.  Negative for abdominal pain, melena, hematochezia, anorexia and hematemesis.  Genitourinary: Positive for menorrhagia.  Musculoskeletal: Negative.   Skin: Negative.  Negative for pallor.  Neurological: Negative.  Negative for light-headedness and paresthesias.  Hematological: Negative.  Does not bruise/bleed easily.  Psychiatric/Behavioral: Negative.  Negative for confusion.       Objective:   Physical Exam  Vitals reviewed. Constitutional: She is oriented to person, place, and time. She appears well-developed and well-nourished. No distress.  HENT:  Head: Normocephalic and atraumatic.  Mouth/Throat: Oropharynx is clear and moist. No oropharyngeal exudate.  Eyes: Conjunctivae are normal. Right eye exhibits no discharge. Left eye exhibits no discharge. No scleral icterus.  Neck: Normal range of motion. Neck supple. No JVD present. No tracheal deviation present. No thyromegaly present.  Cardiovascular: Normal rate, regular rhythm, normal heart sounds and intact distal pulses.  Exam reveals no gallop and no friction rub.   No murmur heard. Pulmonary/Chest: Effort normal and  breath sounds normal. No stridor. No respiratory distress. She has no wheezes. She has no rales. She exhibits no tenderness.  Abdominal: Soft. Bowel sounds are normal. She exhibits no distension and no mass. There is no tenderness. There is no rebound and no guarding.  Musculoskeletal: Normal range of motion. She exhibits no edema and no tenderness.  Lymphadenopathy:    She has no cervical adenopathy.  Neurological: She is oriented to person, place, and time.  Skin: Skin is warm and dry. No rash noted. She is not diaphoretic. No erythema. No pallor.  Psychiatric: She has a normal mood and affect. Her behavior is normal. Judgment and thought content normal.      Lab Results  Component Value Date   WBC 5.0 12/29/2011   HGB 9.8* 12/29/2011   HCT 31.2* 12/29/2011   PLT 208.0 12/29/2011   GLUCOSE 97 12/29/2011   ALT 14 12/29/2011   AST 20 12/29/2011   NA 138 12/29/2011   K 4.1 12/29/2011   CL 104 12/29/2011   CREATININE 0.7 12/29/2011   BUN 9 12/29/2011   CO2 27 12/29/2011   TSH 1.37 12/29/2011   INR 1.1 RATIO* 09/03/2008      Assessment & Plan:

## 2012-03-07 NOTE — Assessment & Plan Note (Signed)
I will recheck her CBC and will look at her vitamin levels as well 

## 2012-03-07 NOTE — Patient Instructions (Signed)

## 2012-03-08 ENCOUNTER — Encounter: Payer: Self-pay | Admitting: Internal Medicine

## 2012-03-28 IMAGING — CT CT CHEST W/ CM
2 of 4 series · 15 of 36 positions shown, 18 images · IV contrast (Omnipaque 300)
Comparison: Chest radiographs dated 04/27/2011

CLINICAL DATA: Sarcoidosis, hemoptysis, shortness of breath with
exertion

CT CHEST WITH CONTRAST
TECHNIQUE: Multidetector CT imaging of the chest was performed
following the standard protocol during bolus administration of
intravenous contrast.
Contrast: 80 ml Qmnipaque-UCC IV

[Series 2: chest routine with · axial · 0.69mm/px · z∈[-270,-30]mm · 12 of 58 slices shown, 15 images]
[im 5/58  mediastinal]
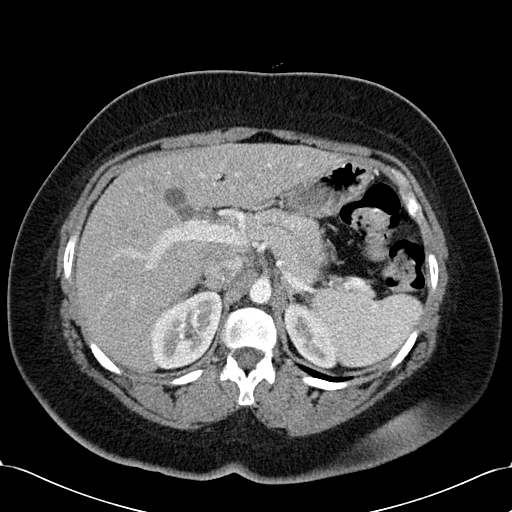
[im 5/58  lung]
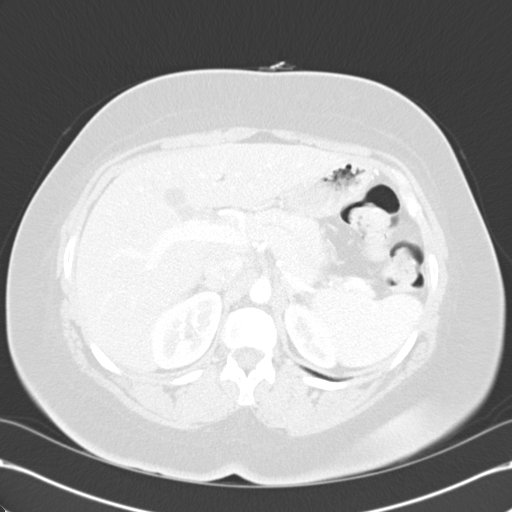
[im 9/58  lung]
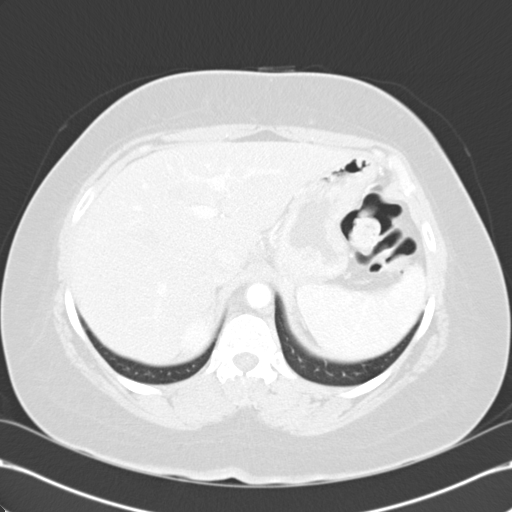
[im 14/58  lung]
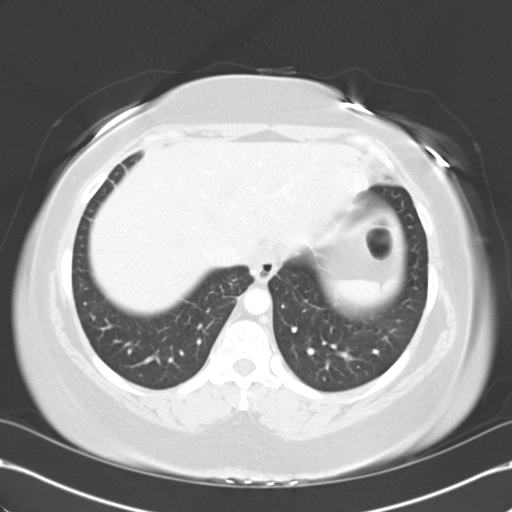
[im 18/58  lung]
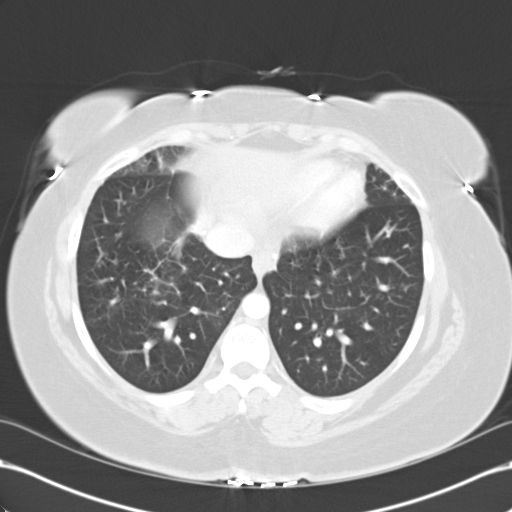
[im 22/58  mediastinal]
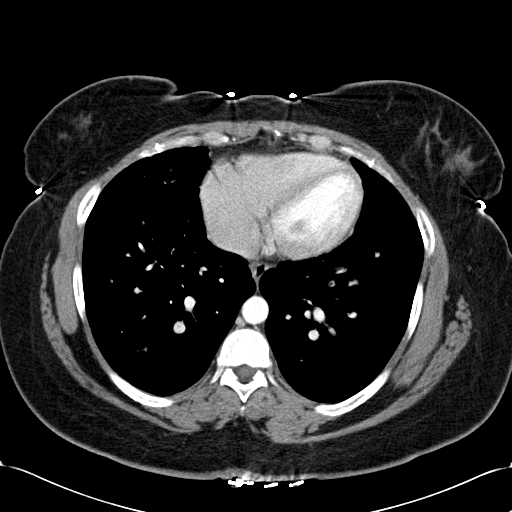
[im 22/58  lung]
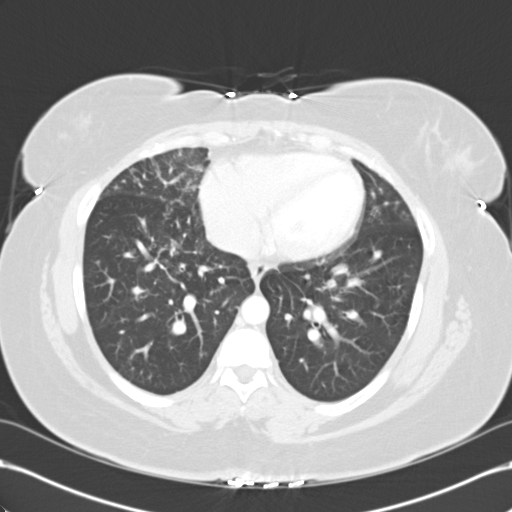
[im 27/58  lung]
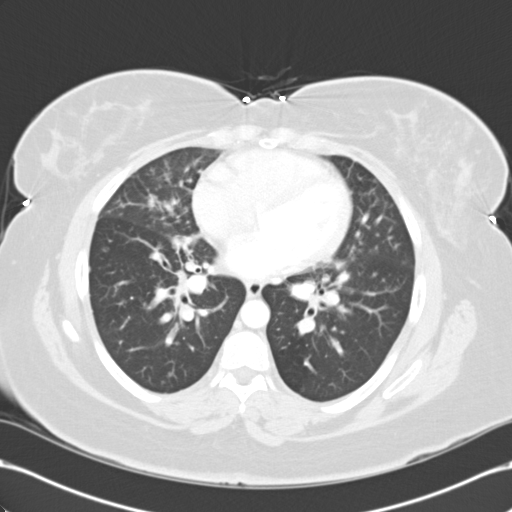
[im 31/58  lung]
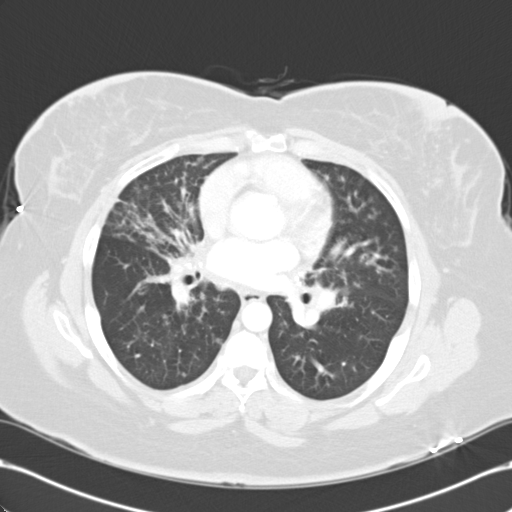
[im 36/58  lung]
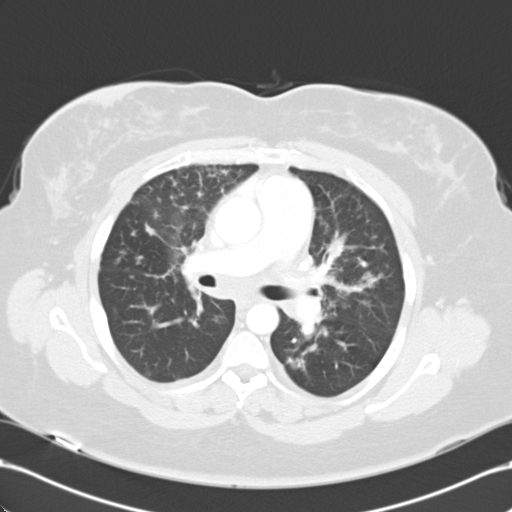
[im 40/58  mediastinal]
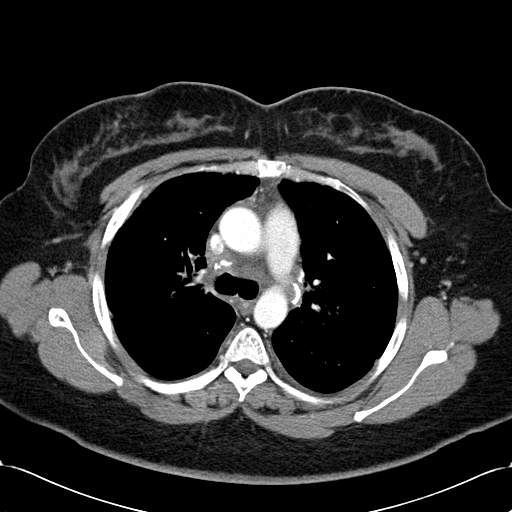
[im 40/58  lung]
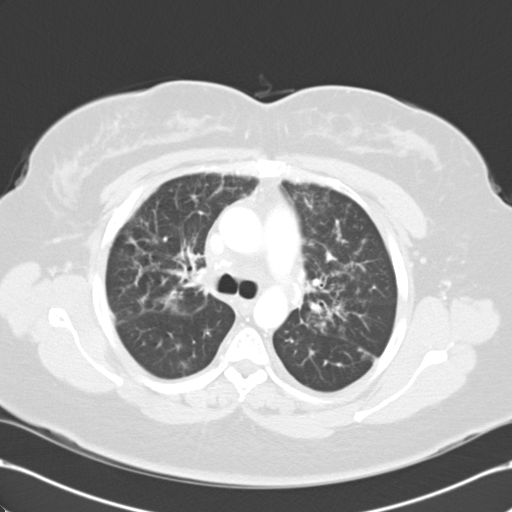
[im 44/58  lung]
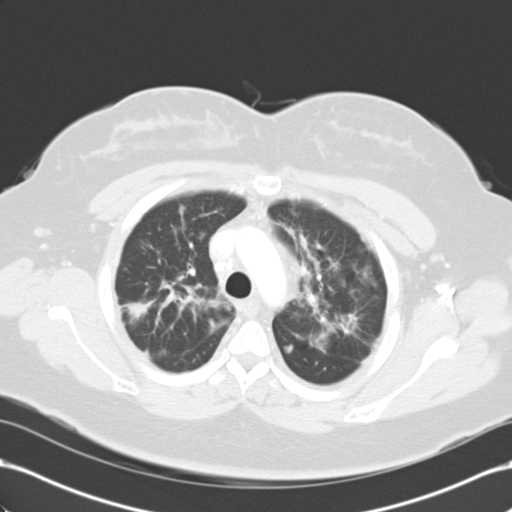
[im 49/58  lung]
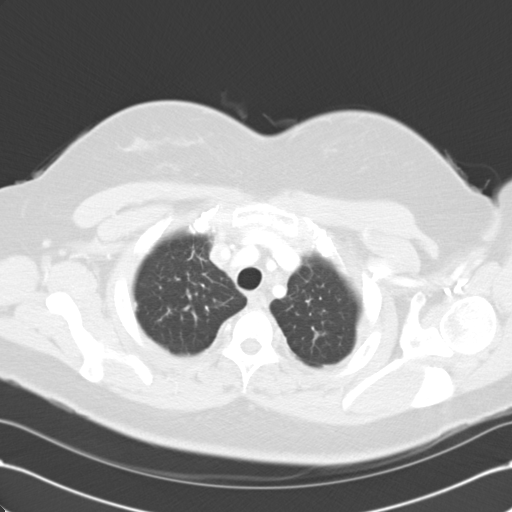
[im 53/58  lung]
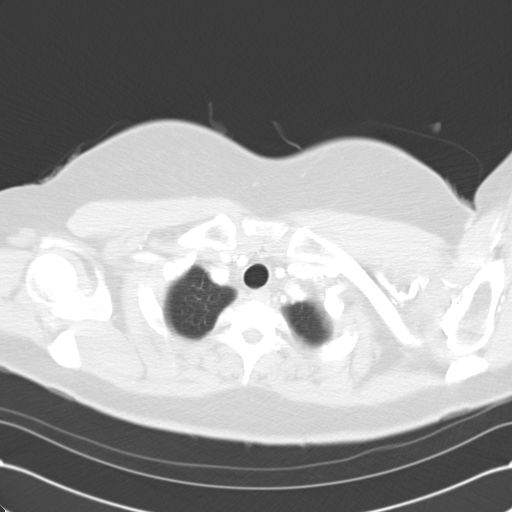

[Series 602: cor · coronal · 0.69mm/px · 3 of 94 slices shown]
[im 19/94  lung]
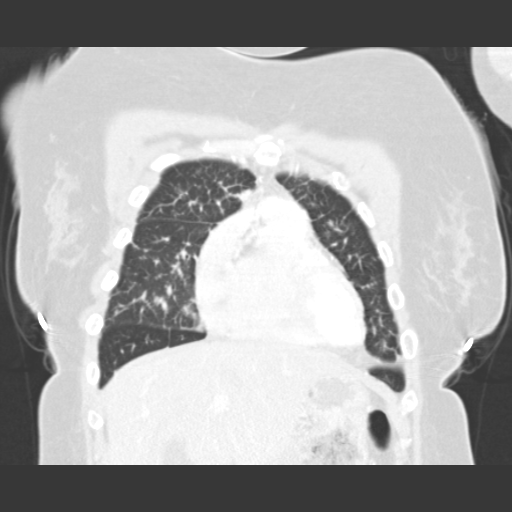
[im 38/94  lung]
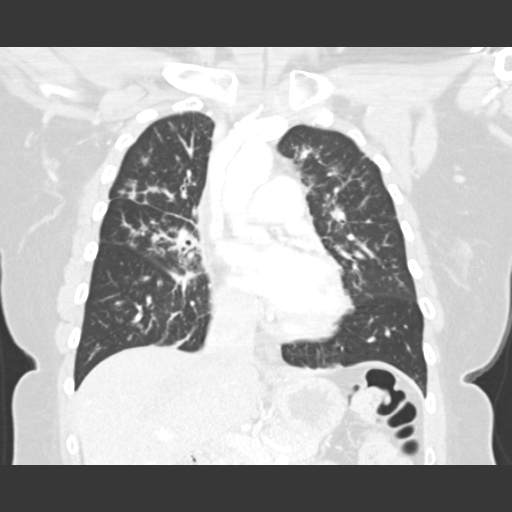
[im 56/94  lung]
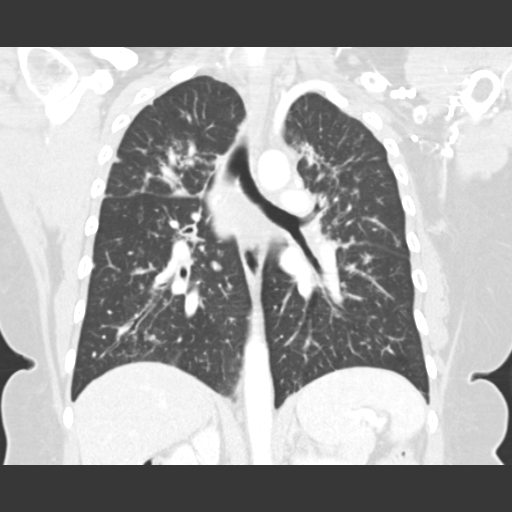

[15 of 36 positions shown; findings below may reference images not displayed]

FINDINGS: Visualized thyroid is unremarkable.

Scattered areas of fibrosis predominantly in the upper lobes,
compatible with known diagnosis of sarcoidosis.  Additional
fibrosis in the superior aspect of the lower lobes.

Scattered mild tree-in-bud nodules in the right lower lobe (for
example, series 3/image 40), likely related to sarcoid, although a
mild superimposed infectious process cannot be excluded.

No pleural effusion or pneumothorax.

The heart is normal in size.  No pericardial effusion.

Calcified mediastinal and bilateral hilar lymph nodes, compatible
with known sarcoidosis.

Visualized upper abdomen is notable for a suspected gallstone
(series 2/image 58) without associated inflammatory changes and
focal fat along the falciform ligament.

Visualized osseous structures are within normal limits.
IMPRESSION: Upper lobe predominant fibrosis with calcified mediastinal and
bilateral hilar lymph nodes, compatible with known sarcoidosis.

Scattered mild tree-in-bud nodules in the right lower lobe, likely
related to sarcoid, mild superimposed infectious process not
excluded.

## 2012-05-08 ENCOUNTER — Ambulatory Visit: Payer: BC Managed Care – PPO | Admitting: Internal Medicine

## 2012-05-08 DIAGNOSIS — Z0289 Encounter for other administrative examinations: Secondary | ICD-10-CM

## 2012-06-11 ENCOUNTER — Encounter: Payer: Self-pay | Admitting: Internal Medicine

## 2012-06-11 ENCOUNTER — Ambulatory Visit (INDEPENDENT_AMBULATORY_CARE_PROVIDER_SITE_OTHER)
Admission: RE | Admit: 2012-06-11 | Discharge: 2012-06-11 | Disposition: A | Discharge status: Home / Self Care | Payer: BC Managed Care – PPO | Source: Ambulatory Visit | Attending: Internal Medicine | Admitting: Internal Medicine

## 2012-06-11 ENCOUNTER — Ambulatory Visit (INDEPENDENT_AMBULATORY_CARE_PROVIDER_SITE_OTHER): Payer: BC Managed Care – PPO | Admitting: Internal Medicine

## 2012-06-11 ENCOUNTER — Other Ambulatory Visit: Payer: BC Managed Care – PPO

## 2012-06-11 VITALS — BP 112/78 | HR 78 | Ht 62.0 in | Wt 182.0 lb

## 2012-06-11 DIAGNOSIS — D869 Sarcoidosis, unspecified: Secondary | ICD-10-CM

## 2012-06-11 DIAGNOSIS — Z23 Encounter for immunization: Secondary | ICD-10-CM

## 2012-06-11 LAB — ANGIOTENSIN CONVERTING ENZYME: Angiotensin-Converting Enzyme: 86 U/L — ABNORMAL HIGH (ref 8–52)

## 2012-06-11 NOTE — Progress Notes (Signed)
Patient ID: Nancy Villarreal, female    DOB: 05/23/1971, 41 y.o.   MRN: [id number]  HPI 11/30/10- [age] yoF former smoker followed here with sarcoid, currently maintained on prednisone 5 mg every other day Bronch NEG 09/24/08- dx based on mediastinal adenopathy/ interstitial disease, ACE 90, response to steroids . She was put back on daily prednisone at 10 mg daily in May, 2011 for ACE 111.  Has had hemoptysis in past, allergic rhinitis and asthma. In September, 2011 was doing very well and was changed to 5 mg every other day. Felt well here January, 2012. Has had some hx asthma and allergic rhinitis. CXR 09/07/10 showed stable Stage III intersiittial scarring unchanged back to 09/11/08. In last 2 weeks has noted increased dyspnea with wheeze lying down, night sweats, joint pains, fatigue, hair falling out, frightened that she couldn't breathe, dyspnea walking parking lot. Last night propped up to sleep. Denies chest pain, palpitation, rash or nodes, leg swelling or pain.    02/15/11- Sarcoid Stage III, hx asthma, rhinitis She feels she is doing well, still maintaining on prednisone 5 mg every other day. She is comfortable with that and feels well. Admits occasional night sweat. Denies rash, cough, nodes.  We discussed how and when to wean off steroids.  05/04/11-  [age] yoF former smoker followed here with sarcoid,  Bronch NEG 09/24/08- dx based on mediastinal adenopathy/ interstitial disease, ACE 90, response to steroids. She called after coughing up a mouthfull of blood on 1 night a week ago. She kept on coughing, but there was no more blood. She still coughs, back to baseline. Notes temps up to 100 at night with some sweats, no nodes or rash. Aware of some bones aching- especially one area lateral left calf at times- nothing visible. More frequent heart burn. Some DOE. Feeling tired x 1 month- wants to sleep. Sweats and ache in shoulders affect sleep comfort.  We reviewed her ACE levels and prior xray images   and discussed her concerns about prednisone.  06/21/11- [age] yoF former smoker followed here with sarcoid,  Bronch NEG 09/24/08- dx based on mediastinal adenopathy/ interstitial disease, ACE 90, response to steroid. She says she feels fine and has had her flu vaccine. Now exercising and lost 5 pounds. She has continued prednisone 10 mg every other day since last here. We reviewed recent labs including bone density which was within normal, and CT scan noted below. Bone Density- Normal- 04/27/11 IMPRESSION: CT CHEST 05/06/11 Upper lobe predominant fibrosis with calcified mediastinal and  bilateral hilar lymph nodes, compatible with known sarcoidosis.  Scattered mild tree-in-bud nodules in the right lower lobe, likely  related to sarcoid, mild superimposed infectious process not  excluded.  Original Report Authenticated By: Charline BillsSRIYESH KRISHNAN, M.D.   09/22/11-  [age] yoF former smoker followed here with sarcoid Stage III,  Bronch NEG 09/24/08- dx based on mediastinal adenopathy/ interstitial disease, ACE 90, response to steroid. Has continued taking prednisone 5 mg every other day. Feels well except for dry skin. Coughs only when she exercises. Denies fever or sweat. PFT: 07/14/2011-normal spirometry flows, slight response to bronchodilator, normal lung volumes, diffusion moderately reduced. FEV1/FVC 0.84, DLCO 55%. We reviewed again the last CT scan and I printed a representative imaged for her to show family.  12/29/11-  [age] yoF former smoker followed here with sarcoid Stage III,  Bronch NEG 09/24/08- dx based on mediastinal adenopathy/ interstitial disease, ACE 90, response to steroid. Her dentist noticed hyperpigmentation on the hard palate. She  complains of fatigue despite adequate sleep. "No energy" not really sleepiness. Denies fever, weight loss, adenopathy, pain. Some increased dry cough without wheeze. She has continued prednisone 5 mg daily. Off of her inhaler "lost it". ACE: 3 months 62, 6 months 67,  7 months 92, 8 months 55, 1 year 54.  02/10/12-  40 yoF former smoker followed here with sarcoid Stage III,  Bronch NEG 09/24/08- dx based on mediastinal adenopathy/ interstitial disease, ACE 90, response to steroid. Iron def anemia SOB and wheezing with cough and congestion comes and goes. Labs from 01/04/2012 included iron low/28, transferrin normal /282. No overt blood loss. She is now taking iron and as directed to discuss this with a primary physician. She needs to establish so we will refer her. She no longer feels run down. "Just a little" chest congestion blamed on the weather. Occasional night sweat possibly from hormones. No adenopathy  06/11/12- 41 yoF former smoker followed here with sarcoid Stage III,  Bronch NEG 09/24/08- dx based on mediastinal adenopathy/ interstitial disease, ACE 90, response to steroid. Iron def anemia Has not taken prednisone since June-no flare ups. She has been feeling very well. She realized she had forgotten prednisone and noticed she was doing well so she deliberately dropped off. We reviewed symptoms suggestive of reactivated sarcoid and she denies all.  Review of Systems-see HPI    Constitutional:  Per HPI HEENT:   No headaches,  Difficulty swallowing,  Tooth/dental problems,  Sore throat,                No sneezing, itching, ear ache, nasal congestion, post nasal drip,  CV:  No chest pain,  PND, swelling in lower extremities, anasarca, dizziness, palpitations GI  No heartburn, indigestion, abdominal pain, nausea, vomiting,  Resp:  No acute shortness of breath with exertion .  No excess mucus, no productive cough,  + non-productive cough,  No coughing up of blood.  No change in color of mucus.  No wheezing.  Skin: no rash or lesions. GU: MS: joint pain .  No decreased range of motion.  No back pain. Psych:  No change in mood or affect. No depression or anxiety.  No memory loss.  Objective:   Physical Exam General- Alert, Oriented, Affect-appropriate,  Distress- none acute. Looks well. Overweight Skin- + mild facial acne scarring, no rash, lesions- none, excoriation- none Lymphadenopathy- none Head- atraumatic            Eyes- Gross vision intact, PERRLA, conjunctivae clear secretions            Ears- Hearing, canals normal            Nose- Clear, No-Septal dev, mucus, polyps, erosion, perforation             Throat- Mallampati II , mucosa clear , drainage- none, tonsils- atrophic. Hyperpigmentation on hard palate. Neck- flexible , trachea midline, no stridor , thyroid nl, carotid no bruit Chest - symmetrical excursion , unlabored           Heart/CV- RRR , no murmur , no gallop  , no rub, nl s1 s2                           - JVD- none , edema- none, stasis changes- none, varices- none           Lung-  chest clear, unlabored, wheeze- none,  dullness-none, rub- none  Chest wall-  Abd-  Br/ Gen/ Rectal- Not done, not indicated Extrem- cyanosis- none, clubbing, none, atrophy- none, strength- nl Neuro- grossly intact to observation

## 2012-06-11 NOTE — Patient Instructions (Addendum)
Order- CXR dx sarcoid              Lab-  ACE level      Dx sarcoid  Please call as needed  Flu vax

## 2012-06-12 DIAGNOSIS — Z23 Encounter for immunization: Secondary | ICD-10-CM

## 2012-06-15 ENCOUNTER — Telehealth: Payer: Self-pay | Admitting: Internal Medicine

## 2012-06-15 NOTE — Telephone Encounter (Signed)
Notes Recorded by Waymon Budge, MD on 06/12/2012 at 1:11 PM ACE level is a little higher that it has been over the past year. Given she feels well and CXR is stable, it is ok to follow with no treatment  Notes Recorded by Waymon Budge, MD on 06/11/2012 at 1:57 PM CXR- Stable sarcoid changes. Not progressive.      Spoke with pt and notified of results/recs per CDY She verbalized understanding Nothing further needed

## 2012-06-17 NOTE — Assessment & Plan Note (Signed)
Clinical remission, off of steroids since June of 2013. She wants to know her status and wishes to have followup labs. Plan-ACE level, chest x-ray.

## 2012-06-20 NOTE — Progress Notes (Signed)
Quick Note:  Nancy Villarreal, [facility] 06/15/2012 3:44 PM Signed Notes Recorded by Waymon Budge, MD on 06/12/2012 at 1:11 PM ACE level is a little higher that it has been over the past year. Given she feels well and CXR is stable, it is ok to follow with no treatment  Notes Recorded by Waymon Budge, MD on 06/11/2012 at 1:57 PM CXR- Stable sarcoid changes. Not progressive.        Spoke with pt and notified of results/recs per CDY She verbalized understanding Nothing further needed  ______

## 2012-08-21 ENCOUNTER — Telehealth: Payer: Self-pay | Admitting: Internal Medicine

## 2012-08-21 NOTE — Telephone Encounter (Signed)
Spoke with a patient-aware that CY not here on wed or Thursday; can work in on Friday or with TP tomorrow or Thursday. Pt did not want this and stated she was fine and would wait.

## 2012-12-10 ENCOUNTER — Encounter: Payer: Self-pay | Admitting: Internal Medicine

## 2012-12-10 ENCOUNTER — Ambulatory Visit (INDEPENDENT_AMBULATORY_CARE_PROVIDER_SITE_OTHER)
Admission: RE | Admit: 2012-12-10 | Discharge: 2012-12-10 | Disposition: A | Discharge status: Home / Self Care | Payer: BC Managed Care – PPO | Source: Ambulatory Visit | Attending: Internal Medicine | Admitting: Internal Medicine

## 2012-12-10 ENCOUNTER — Other Ambulatory Visit (INDEPENDENT_AMBULATORY_CARE_PROVIDER_SITE_OTHER): Payer: BC Managed Care – PPO

## 2012-12-10 ENCOUNTER — Ambulatory Visit (INDEPENDENT_AMBULATORY_CARE_PROVIDER_SITE_OTHER): Payer: BC Managed Care – PPO | Admitting: Internal Medicine

## 2012-12-10 VITALS — BP 120/76 | HR 75 | Ht 63.0 in | Wt 167.4 lb

## 2012-12-10 DIAGNOSIS — D869 Sarcoidosis, unspecified: Secondary | ICD-10-CM

## 2012-12-10 LAB — BASIC METABOLIC PANEL
BUN: 10 mg/dL (ref 6–23)
Chloride: 105 mEq/L (ref 96–112)
Potassium: 3.8 mEq/L (ref 3.5–5.1)

## 2012-12-10 LAB — HEPATIC FUNCTION PANEL
Albumin: 3.4 g/dL — ABNORMAL LOW (ref 3.5–5.2)
Alkaline Phosphatase: 40 U/L (ref 39–117)
Total Bilirubin: 0.2 mg/dL — ABNORMAL LOW (ref 0.3–1.2)
Total Protein: 8.8 g/dL — ABNORMAL HIGH (ref 6.0–8.3)

## 2012-12-10 NOTE — Patient Instructions (Addendum)
Order- CXR    Dx sarcoid              Lab-  ACE level, BMET, liver panel    Dx Sarcoid

## 2012-12-10 NOTE — Progress Notes (Signed)
Patient ID: [name], female    DOB: 05/23/1971, 42 y.o.   MRN: [id number]  HPI 11/30/10- [age] yoF former smoker followed here with sarcoid, currently maintained on prednisone 5 mg every other day Bronch NEG 09/24/08- dx based on mediastinal adenopathy/ interstitial disease, ACE 90, response to steroids . She was put back on daily prednisone at 10 mg daily in May, 2011 for ACE 111.  Has had hemoptysis in past, allergic rhinitis and asthma. In September, 2011 was doing very well and was changed to 5 mg every other day. Felt well here January, 2012. Has had some hx asthma and allergic rhinitis. CXR 09/07/10 showed stable Stage III intersiittial scarring unchanged back to 09/11/08. In last 2 weeks has noted increased dyspnea with wheeze lying down, night sweats, joint pains, fatigue, hair falling out, frightened that she couldn't breathe, dyspnea walking parking lot. Last night propped up to sleep. Denies chest pain, palpitation, rash or nodes, leg swelling or pain.    02/15/11- Sarcoid Stage III, hx asthma, rhinitis She feels she is doing well, still maintaining on prednisone 5 mg every other day. She is comfortable with that and feels well. Admits occasional night sweat. Denies rash, cough, nodes.  We discussed how and when to wean off steroids.  05/04/11-  [age] yoF former smoker followed here with sarcoid,  Bronch NEG 09/24/08- dx based on mediastinal adenopathy/ interstitial disease, ACE 90, response to steroids. She called after coughing up a mouthfull of blood on 1 night a week ago. She kept on coughing, but there was no more blood. She still coughs, back to baseline. Notes temps up to 100 at night with some sweats, no nodes or rash. Aware of some bones aching- especially one area lateral left calf at times- nothing visible. More frequent heart burn. Some DOE. Feeling tired x 1 month- wants to sleep. Sweats and ache in shoulders affect sleep comfort.  We reviewed her ACE levels and prior xray images   and discussed her concerns about prednisone.  06/21/11- [age] yoF former smoker followed here with sarcoid,  Bronch NEG 09/24/08- dx based on mediastinal adenopathy/ interstitial disease, ACE 90, response to steroid. She says she feels fine and has had her flu vaccine. Now exercising and lost 5 pounds. She has continued prednisone 10 mg every other day since last here. We reviewed recent labs including bone density which was within normal, and CT scan noted below. Bone Density- Normal- 04/27/11 IMPRESSION: CT CHEST 05/06/11 Upper lobe predominant fibrosis with calcified mediastinal and  bilateral hilar lymph nodes, compatible with known sarcoidosis.  Scattered mild tree-in-bud nodules in the right lower lobe, likely  related to sarcoid, mild superimposed infectious process not  excluded.  Original Report Authenticated By: Charline BillsSRIYESH KRISHNAN, M.D.   09/22/11-  [age] yoF former smoker followed here with sarcoid Stage III,  Bronch NEG 09/24/08- dx based on mediastinal adenopathy/ interstitial disease, ACE 90, response to steroid. Has continued taking prednisone 5 mg every other day. Feels well except for dry skin. Coughs only when she exercises. Denies fever or sweat. PFT: 07/14/2011-normal spirometry flows, slight response to bronchodilator, normal lung volumes, diffusion moderately reduced. FEV1/FVC 0.84, DLCO 55%. We reviewed again the last CT scan and I printed a representative imaged for her to show family.  12/29/11-  [age] yoF former smoker followed here with sarcoid Stage III,  Bronch NEG 09/24/08- dx based on mediastinal adenopathy/ interstitial disease, ACE 90, response to steroid. Her dentist noticed hyperpigmentation on the hard palate. She  complains of fatigue despite adequate sleep. "No energy" not really sleepiness. Denies fever, weight loss, adenopathy, pain. Some increased dry cough without wheeze. She has continued prednisone 5 mg daily. Off of her inhaler "lost it". ACE: 3 months 62, 6 months 67,  7 months 92, 8 months 55, 1 year 54.  02/10/12-  40 yoF former smoker followed here with sarcoid Stage III,  Bronch NEG 09/24/08- dx based on mediastinal adenopathy/ interstitial disease, ACE 90, response to steroid. Iron def anemia SOB and wheezing with cough and congestion comes and goes. Labs from 01/04/2012 included iron low/28, transferrin normal /282. No overt blood loss. She is now taking iron and as directed to discuss this with a primary physician. She needs to establish so we will refer her. She no longer feels run down. "Just a little" chest congestion blamed on the weather. Occasional night sweat possibly from hormones. No adenopathy  06/11/12- 41 yoF former smoker followed here with sarcoid Stage III,  Bronch NEG 09/24/08- dx based on mediastinal adenopathy/ interstitial disease, ACE 90, response to steroid. Iron def anemia Has not taken prednisone since June-no flare ups. She has been feeling very well. She realized she had forgotten prednisone and noticed she was doing well so she deliberately dropped off. We reviewed symptoms suggestive of reactivated sarcoid and she denies all.  12/10/12- 71 yoF former smoker followed here with sarcoid Stage III,  Bronch NEG 09/24/08- dx based on mediastinal adenopathy/ interstitial disease, ACE 90, response to steroid. Iron def anemia FOLLOWS [id number] coughing up blood since last visit; deep cough in chest Hemoptysis for the first time in over a year, 2 or 3 occasions less than 1 teaspoon. Off of prednisone since June of 2013. Increased cough since a cold 2 weeks ago. Some sweats. Feels tired. She is dieting. No adenopathy. Vague aching persistent chest pain sternum through to back at least for several days. Angiotensin-Converting Enzyme 8 - 52 U/L  86 (H) 06/11/12    68 (H)    62 (H)    67 (H)    92 (H)    85 (H)    59 (H) 11/30/10   CXR 06/20/12-IMPRESSION:  1. Nodularity and architectural distortion in the lungs, stable  and compatible  with sarcoidosis.  Original Report Authenticated By: Dellia Cloud, M.D.  Review of Systems-see HPI    Constitutional:  Per HPI HEENT:   No headaches,  Difficulty swallowing,  Tooth/dental problems,  Sore throat,                No sneezing, itching, ear ache, nasal congestion, post nasal drip,  CV:  +atypical chest pain,  No-PND, swelling in lower extremities, anasarca, dizziness, palpitations GI  No heartburn, indigestion, abdominal pain, nausea, vomiting,  Resp:  No acute shortness of breath with exertion .  No excess mucus, no productive cough,                         + non-productive cough,  + coughing up of blood.  No change in color of mucus.  No wheezing.  Skin: no rash or lesions. GU: MS: joint pain .  No decreased range of motion.  No back pain. Psych:  No change in mood or affect. No depression or anxiety.  No memory loss.  Objective:   Physical Exam General- Alert, Oriented, Affect-appropriate, Distress- none acute. Looks well.  Skin- + mild facial acne scarring, no rash, lesions- none, excoriation- none Lymphadenopathy- none Head-  atraumatic            Eyes- Gross vision intact, PERRLA, conjunctivae clear secretions            Ears- Hearing, canals normal            Nose- Clear, No-Septal dev, mucus, polyps, erosion, perforation             Throat- Mallampati II , mucosa clear , drainage- none, tonsils present.               +Hyperpigmentation on hard palate. Neck- flexible , trachea midline, no stridor , thyroid nl, carotid no bruit Chest - symmetrical excursion , unlabored           Heart/CV- RRR , no murmur , no gallop  , no rub, nl s1 s2                           - JVD- none , edema- none, stasis changes- none, varices- none           Lung-  chest clear, unlabored, wheeze- none,  dullness-none, rub- none           Chest wall-  Abd-  Br/ Gen/ Rectal- Not done, not indicated Extrem- cyanosis- none, clubbing, none, atrophy- none, strength- nl Neuro- grossly  intact to observation

## 2012-12-11 ENCOUNTER — Telehealth: Payer: Self-pay | Admitting: Internal Medicine

## 2012-12-11 DIAGNOSIS — R042 Hemoptysis: Secondary | ICD-10-CM

## 2012-12-11 DIAGNOSIS — D869 Sarcoidosis, unspecified: Secondary | ICD-10-CM

## 2012-12-11 NOTE — Telephone Encounter (Signed)
Spoke with pt and informed of lab and cxr results per [name].  Pt states that she still doesn't know what to do from here.  She states that [name] had mentioned doing a Chest Ct depending on results of cxr..  Pt denies any more hemoptysis since ov but states that cough and SOB is the same.  Pt is requesting to speak to [name] or Florentina Addison.  Please advise.

## 2012-12-11 NOTE — Telephone Encounter (Signed)
Recommend order CT chest with contrast   Dx, sarcoid, hemoptysis She just had a BMET.

## 2012-12-11 NOTE — Telephone Encounter (Signed)
Spoke with pt and notified of recs per CDY She verbalized understanding and I have sent order to [facility]

## 2012-12-13 ENCOUNTER — Ambulatory Visit (INDEPENDENT_AMBULATORY_CARE_PROVIDER_SITE_OTHER)
Admission: RE | Admit: 2012-12-13 | Discharge: 2012-12-13 | Disposition: A | Discharge status: Home / Self Care | Payer: BC Managed Care – PPO | Source: Ambulatory Visit | Attending: Internal Medicine | Admitting: Internal Medicine

## 2012-12-13 DIAGNOSIS — R042 Hemoptysis: Secondary | ICD-10-CM

## 2012-12-13 DIAGNOSIS — D869 Sarcoidosis, unspecified: Secondary | ICD-10-CM

## 2012-12-13 MED ORDER — IOHEXOL 300 MG/ML  SOLN
80.0000 mL | Freq: Once | INTRAMUSCULAR | Status: AC | PRN
  Administered 2012-12-13: 80 mL via INTRAVENOUS

## 2012-12-13 NOTE — Progress Notes (Signed)
Quick Note:  Pt aware of results and to keep next OV; call sooner if needed. ______

## 2012-12-17 NOTE — Assessment & Plan Note (Signed)
ACE level was at the high end of her range. Symptoms now began with a cold and are nonspecific but sarcoid activity cannot be excluded. Plan-chest x-ray, ACE level, chemistry panel.

## 2012-12-19 ENCOUNTER — Telehealth: Payer: Self-pay | Admitting: Internal Medicine

## 2012-12-19 ENCOUNTER — Other Ambulatory Visit: Payer: Self-pay | Admitting: Internal Medicine

## 2012-12-19 NOTE — Telephone Encounter (Signed)
LMTCB

## 2012-12-19 NOTE — Telephone Encounter (Signed)
Called to speak to the pt and she only wants to speak to [facility].  I will forward this message to Florentina Addison so that she can call the pt.

## 2012-12-20 NOTE — Telephone Encounter (Signed)
Pt wanted to let me know that her CT results were not release in EPIC; I have taken care of this.

## 2013-01-09 ENCOUNTER — Encounter: Payer: Self-pay | Admitting: Internal Medicine

## 2013-01-09 ENCOUNTER — Ambulatory Visit (INDEPENDENT_AMBULATORY_CARE_PROVIDER_SITE_OTHER): Payer: BC Managed Care – PPO | Admitting: Internal Medicine

## 2013-01-09 VITALS — BP 118/74 | HR 76 | Ht 63.0 in | Wt 167.2 lb

## 2013-01-09 DIAGNOSIS — D869 Sarcoidosis, unspecified: Secondary | ICD-10-CM

## 2013-01-09 NOTE — Assessment & Plan Note (Signed)
We reviewed her CT images and labs together. Stable, significant Stage III-IV fibronodular scarring. Little end-organ involvement is seen.  Plan- We will remain off prednisone. Watch for changes as discussed.

## 2013-01-09 NOTE — Patient Instructions (Addendum)
Please call as needed 

## 2013-01-09 NOTE — Progress Notes (Signed)
Patient ID: [name], female    DOB: 05/23/1971, 42 y.o.   MRN: [id number]  HPI 11/30/10- [age] yoF former smoker followed here with sarcoid, currently maintained on prednisone 5 mg every other day Bronch NEG 09/24/08- dx based on mediastinal adenopathy/ interstitial disease, ACE 90, response to steroids . She was put back on daily prednisone at 10 mg daily in May, 2011 for ACE 111.  Has had hemoptysis in past, allergic rhinitis and asthma. In September, 2011 was doing very well and was changed to 5 mg every other day. Felt well here January, 2012. Has had some hx asthma and allergic rhinitis. CXR 09/07/10 showed stable Stage III intersiittial scarring unchanged back to 09/11/08. In last 2 weeks has noted increased dyspnea with wheeze lying down, night sweats, joint pains, fatigue, hair falling out, frightened that she couldn't breathe, dyspnea walking parking lot. Last night propped up to sleep. Denies chest pain, palpitation, rash or nodes, leg swelling or pain.    02/15/11- Sarcoid Stage III, hx asthma, rhinitis She feels she is doing well, still maintaining on prednisone 5 mg every other day. She is comfortable with that and feels well. Admits occasional night sweat. Denies rash, cough, nodes.  We discussed how and when to wean off steroids.  05/04/11-  [age] yoF former smoker followed here with sarcoid,  Bronch NEG 09/24/08- dx based on mediastinal adenopathy/ interstitial disease, ACE 90, response to steroids. She called after coughing up a mouthfull of blood on 1 night a week ago. She kept on coughing, but there was no more blood. She still coughs, back to baseline. Notes temps up to 100 at night with some sweats, no nodes or rash. Aware of some bones aching- especially one area lateral left calf at times- nothing visible. More frequent heart burn. Some DOE. Feeling tired x 1 month- wants to sleep. Sweats and ache in shoulders affect sleep comfort.  We reviewed her ACE levels and prior xray images   and discussed her concerns about prednisone.  06/21/11- [age] yoF former smoker followed here with sarcoid,  Bronch NEG 09/24/08- dx based on mediastinal adenopathy/ interstitial disease, ACE 90, response to steroid. She says she feels fine and has had her flu vaccine. Now exercising and lost 5 pounds. She has continued prednisone 10 mg every other day since last here. We reviewed recent labs including bone density which was within normal, and CT scan noted below. Bone Density- Normal- 04/27/11 IMPRESSION: CT CHEST 05/06/11 Upper lobe predominant fibrosis with calcified mediastinal and  bilateral hilar lymph nodes, compatible with known sarcoidosis.  Scattered mild tree-in-bud nodules in the right lower lobe, likely  related to sarcoid, mild superimposed infectious process not  excluded.  Original Report Authenticated By: Charline BillsSRIYESH KRISHNAN, M.D.   09/22/11-  [age] yoF former smoker followed here with sarcoid Stage III,  Bronch NEG 09/24/08- dx based on mediastinal adenopathy/ interstitial disease, ACE 90, response to steroid. Has continued taking prednisone 5 mg every other day. Feels well except for dry skin. Coughs only when she exercises. Denies fever or sweat. PFT: 07/14/2011-normal spirometry flows, slight response to bronchodilator, normal lung volumes, diffusion moderately reduced. FEV1/FVC 0.84, DLCO 55%. We reviewed again the last CT scan and I printed a representative imaged for her to show family.  12/29/11-  [age] yoF former smoker followed here with sarcoid Stage III,  Bronch NEG 09/24/08- dx based on mediastinal adenopathy/ interstitial disease, ACE 90, response to steroid. Her dentist noticed hyperpigmentation on the hard palate. She  complains of fatigue despite adequate sleep. "No energy" not really sleepiness. Denies fever, weight loss, adenopathy, pain. Some increased dry cough without wheeze. She has continued prednisone 5 mg daily. Off of her inhaler "lost it". ACE: 3 months 62, 6 months 67,  7 months 92, 8 months 55, 1 year 54.  02/10/12-  40 yoF former smoker followed here with sarcoid Stage III,  Bronch NEG 09/24/08- dx based on mediastinal adenopathy/ interstitial disease, ACE 90, response to steroid. Iron def anemia SOB and wheezing with cough and congestion comes and goes. Labs from 01/04/2012 included iron low/28, transferrin normal /282. No overt blood loss. She is now taking iron and as directed to discuss this with a primary physician. She needs to establish so we will refer her. She no longer feels run down. "Just a little" chest congestion blamed on the weather. Occasional night sweat possibly from hormones. No adenopathy  06/11/12- 41 yoF former smoker followed here with sarcoid Stage III,  Bronch NEG 09/24/08- dx based on mediastinal adenopathy/ interstitial disease, ACE 90, response to steroid. Iron def anemia Has not taken prednisone since June-no flare ups. She has been feeling very well. She realized she had forgotten prednisone and noticed she was doing well so she deliberately dropped off. We reviewed symptoms suggestive of reactivated sarcoid and she denies all.  12/10/12- 5 yoF former smoker followed here with sarcoid Stage III,  Bronch NEG 09/24/08- dx based on mediastinal adenopathy/ interstitial disease, ACE 90, response to steroid. Iron def anemia FOLLOWS [id number] coughing up blood since last visit; deep cough in chest Hemoptysis for the first time in over a year, 2 or 3 occasions less than 1 teaspoon. Off of prednisone since June of 2013. Increased cough since a cold 2 weeks ago. Some sweats. Feels tired. She is dieting. No adenopathy. Vague aching persistent chest pain sternum through to back at least for several days. Angiotensin-Converting Enzyme 8 - 52 U/L  86 (H) 06/11/12    68 (H)    62 (H)    67 (H)    92 (H)    85 (H)    59 (H) 11/30/10   CXR 06/20/12-IMPRESSION:  1. Nodularity and architectural distortion in the lungs, stable  and compatible  with sarcoidosis.  Original Report Authenticated By: Dellia Cloud, M.D.  01/09/13- 59 yoF former smoker followed here with sarcoid Stage III,  Bronch NEG 09/24/08- dx based on mediastinal adenopathy/ interstitial disease, ACE 90, response to steroid. Iron def anemia Chest feels ok. Slight cough, scant clear mucus. Not on prednisone. Still night sweat. No rash, nodes. ACE- 12/10/12- 69    Renal and liver function normal. CT chest 12/13/12-  IMPRESSION:  1. No acute findings.  2. Upper lobe predominant fibrotic changes and perilymphatic  nodularity consistent with sarcoid. The overall extent of  pulmonary sarcoid is not significantly changed from previous exam.  3. Stable calcified and noncalcified mediastinal and hilar lymph  nodes.  Original Report Authenticated By: Signa Kell, M.D.   Review of Systems-see HPI    Constitutional:  Per HPI HEENT:   No headaches,  Difficulty swallowing,  Tooth/dental problems,  Sore throat,                No sneezing, itching, ear ache, nasal congestion, post nasal drip,  CV:  No- chest pain,  No-PND, swelling in lower extremities, anasarca, dizziness, palpitations GI  No heartburn, indigestion, abdominal pain, nausea, vomiting,  Resp:  No acute shortness of breath with exertion .  No excess mucus, no productive cough,                         +minor non-productive cough,  No-coughing up of blood.  No change in color of mucus.  No wheezing.  Skin: no rash or lesions. GU: MS: joint pain .  No decreased range of motion.  No back pain. Psych:  No change in mood or affect. No depression or anxiety.  No memory loss.  Objective:   Physical Exam General- Alert, Oriented, Affect-appropriate, Distress- none acute. Looks well.  Skin- + mild facial acne scarring, no rash, lesions- none, excoriation- none Lymphadenopathy- none Head- atraumatic            Eyes- Gross vision intact, PERRLA, conjunctivae clear secretions            Ears- Hearing, canals  normal            Nose- Clear, No-Septal dev, mucus, polyps, erosion, perforation             Throat- Mallampati II , mucosa clear , drainage- none, tonsils present.               +Hyperpigmentation on hard palate. Neck- flexible , trachea midline, no stridor , thyroid nl, carotid no bruit Chest - symmetrical excursion , unlabored           Heart/CV- RRR , no murmur , no gallop  , no rub, nl s1 s2                           - JVD- none , edema- none, stasis changes- none, varices- none           Lung-  + slight dry cough, chest clear, unlabored, wheeze- none,  dullness-none, rub- none           Chest wall-  Abd-  Br/ Gen/ Rectal- Not done, not indicated Extrem- cyanosis- none, clubbing, none, atrophy- none, strength- nl Neuro- grossly intact to observation

## 2013-07-11 ENCOUNTER — Other Ambulatory Visit: Payer: Self-pay

## 2013-07-12 ENCOUNTER — Ambulatory Visit: Payer: BC Managed Care – PPO | Admitting: Internal Medicine

## 2013-07-16 ENCOUNTER — Ambulatory Visit (INDEPENDENT_AMBULATORY_CARE_PROVIDER_SITE_OTHER): Payer: BC Managed Care – PPO

## 2013-07-16 DIAGNOSIS — Z23 Encounter for immunization: Secondary | ICD-10-CM

## 2013-09-02 ENCOUNTER — Ambulatory Visit: Payer: BC Managed Care – PPO | Admitting: Internal Medicine

## 2013-10-25 ENCOUNTER — Ambulatory Visit (INDEPENDENT_AMBULATORY_CARE_PROVIDER_SITE_OTHER)
Admission: RE | Admit: 2013-10-25 | Discharge: 2013-10-25 | Disposition: A | Discharge status: Home / Self Care | Payer: BC Managed Care – PPO | Source: Ambulatory Visit | Attending: Internal Medicine | Admitting: Internal Medicine

## 2013-10-25 ENCOUNTER — Encounter: Payer: Self-pay | Admitting: Internal Medicine

## 2013-10-25 ENCOUNTER — Ambulatory Visit (INDEPENDENT_AMBULATORY_CARE_PROVIDER_SITE_OTHER): Payer: BC Managed Care – PPO | Admitting: Internal Medicine

## 2013-10-25 VITALS — BP 110/82 | HR 84 | Ht 63.0 in | Wt 176.0 lb

## 2013-10-25 DIAGNOSIS — D869 Sarcoidosis, unspecified: Secondary | ICD-10-CM

## 2013-10-25 DIAGNOSIS — J45909 Unspecified asthma, uncomplicated: Secondary | ICD-10-CM

## 2013-10-25 MED ORDER — ALBUTEROL SULFATE HFA 108 (90 BASE) MCG/ACT IN AERS: INHALATION_SPRAY | RESPIRATORY_TRACT | Status: DC

## 2013-10-25 NOTE — Patient Instructions (Signed)
Script refill albuterol HFA rescue inhaler in case needed for chest tightness, wheeze  Order- CXR- dx sarcoid

## 2013-10-25 NOTE — Progress Notes (Signed)
Patient ID: [name], female    DOB: 05/23/1971, 43 y.o.   MRN: [id number]  HPI 11/30/10- 43 yoF former smoker followed here with sarcoid, currently maintained on prednisone 5 mg every other day Bronch NEG 09/24/08- dx based on mediastinal adenopathy/ interstitial disease, ACE 90, response to steroids . She was put back on daily prednisone at 10 mg daily in May, 2011 for ACE 111.  Has had hemoptysis in past, allergic rhinitis and asthma. In September, 2011 was doing very well and was changed to 5 mg every other day. Felt well here January, 2012. Has had some hx asthma and allergic rhinitis. CXR 09/07/10 showed stable Stage III intersiittial scarring unchanged back to 09/11/08. In last 2 weeks has noted increased dyspnea with wheeze lying down, night sweats, joint pains, fatigue, hair falling out, frightened that she couldn't breathe, dyspnea walking parking lot. Last night propped up to sleep. Denies chest pain, palpitation, rash or nodes, leg swelling or pain.    02/15/11- Sarcoid Stage III, hx asthma, rhinitis She feels she is doing well, still maintaining on prednisone 5 mg every other day. She is comfortable with that and feels well. Admits occasional night sweat. Denies rash, cough, nodes.  We discussed how and when to wean off steroids.  05/04/11-  [age] yoF former smoker followed here with sarcoid,  Bronch NEG 09/24/08- dx based on mediastinal adenopathy/ interstitial disease, ACE 90, response to steroids. She called after coughing up a mouthfull of blood on 1 night a week ago. She kept on coughing, but there was no more blood. She still coughs, back to baseline. Notes temps up to 100 at night with some sweats, no nodes or rash. Aware of some bones aching- especially one area lateral left calf at times- nothing visible. More frequent heart burn. Some DOE. Feeling tired x 1 month- wants to sleep. Sweats and ache in shoulders affect sleep comfort.  We reviewed her ACE levels and prior xray images   and discussed her concerns about prednisone.  06/21/11- 43 yoF former smoker followed here with sarcoid,  Bronch NEG 09/24/08- dx based on mediastinal adenopathy/ interstitial disease, ACE 90, response to steroid. She says she feels fine and has had her flu vaccine. Now exercising and lost 5 pounds. She has continued prednisone 10 mg every other day since last here. We reviewed recent labs including bone density which was within normal, and CT scan noted below. Bone Density- Normal- 04/27/11 IMPRESSION: CT CHEST 05/06/11 Upper lobe predominant fibrosis with calcified mediastinal and  bilateral hilar lymph nodes, compatible with known sarcoidosis.  Scattered mild tree-in-bud nodules in the right lower lobe, likely  related to sarcoid, mild superimposed infectious process not  excluded.  Original Report Authenticated By: Charline BillsSRIYESH KRISHNAN, M.D.   09/22/11-  [age] yoF former smoker followed here with sarcoid Stage III,  Bronch NEG 09/24/08- dx based on mediastinal adenopathy/ interstitial disease, ACE 90, response to steroid. Has continued taking prednisone 5 mg every other day. Feels well except for dry skin. Coughs only when she exercises. Denies fever or sweat. PFT: 07/14/2011-normal spirometry flows, slight response to bronchodilator, normal lung volumes, diffusion moderately reduced. FEV1/FVC 0.84, DLCO 55%. We reviewed again the last CT scan and I printed a representative imaged for her to show family.  12/29/11-  [age] yoF former smoker followed here with sarcoid Stage III,  Bronch NEG 09/24/08- dx based on mediastinal adenopathy/ interstitial disease, ACE 90, response to steroid. Her dentist noticed hyperpigmentation on the hard palate. She  complains of fatigue despite adequate sleep. "No energy" not really sleepiness. Denies fever, weight loss, adenopathy, pain. Some increased dry cough without wheeze. She has continued prednisone 5 mg daily. Off of her inhaler "lost it". ACE: 3 months 62, 6 months 67,  7 months 92, 8 months 55, 1 year 55.  02/10/12-  43 yoF former smoker followed here with sarcoid Stage III,  Bronch NEG 09/24/08- dx based on mediastinal adenopathy/ interstitial disease, ACE 90, response to steroid. Iron def anemia SOB and wheezing with cough and congestion comes and goes. Labs from 01/04/2012 included iron low/28, transferrin normal /282. No overt blood loss. She is now taking iron and as directed to discuss this with a primary physician. She needs to establish so we will refer her. She no longer feels run down. "Just a little" chest congestion blamed on the weather. Occasional night sweat possibly from hormones. No adenopathy  06/11/12- 41 yoF former smoker followed here with sarcoid Stage III,  Bronch NEG 09/24/08- dx based on mediastinal adenopathy/ interstitial disease, ACE 90, response to steroid. Iron def anemia Has not taken prednisone since June-no flare ups. She has been feeling very well. She realized she had forgotten prednisone and noticed she was doing well so she deliberately dropped off. We reviewed symptoms suggestive of reactivated sarcoid and she denies all.  12/10/12- 43 yoF former smoker followed here with sarcoid Stage III,  Bronch NEG 09/24/08- dx based on mediastinal adenopathy/ interstitial disease, ACE 90, response to steroid. Iron def anemia FOLLOWS [id number] coughing up blood since last visit; deep cough in chest Hemoptysis for the first time in over a year, 2 or 3 occasions less than 1 teaspoon. Off of prednisone since June of 2013. Increased cough since a cold 2 weeks ago. Some sweats. Feels tired. She is dieting. No adenopathy. Vague aching persistent chest pain sternum through to back at least for several days. Angiotensin-Converting Enzyme 8 - 52 U/L  86 (H) 06/11/12    68 (H)    62 (H)    67 (H)    92 (H)    85 (H)    59 (H) 11/30/10   CXR 06/20/12-IMPRESSION:  1. Nodularity and architectural distortion in the lungs, stable  and compatible  with sarcoidosis.  Original Report Authenticated By: Dellia Cloud, M.D.  01/09/13- 67 yoF former smoker followed here with sarcoid Stage III,  Bronch NEG 09/24/08- dx based on mediastinal adenopathy/ interstitial disease, ACE 90, response to steroid. Iron def anemia Chest feels ok. Slight cough, scant clear mucus. Not on prednisone. Still night sweat. No rash, nodes. ACE- 12/10/12- 69    Renal and liver function normal. CT chest 12/13/12-  IMPRESSION:  1. No acute findings.  2. Upper lobe predominant fibrotic changes and perilymphatic  nodularity consistent with sarcoid. The overall extent of  pulmonary sarcoid is not significantly changed from previous exam.  3. Stable calcified and noncalcified mediastinal and hilar lymph  nodes.  Original Report Authenticated By: Signa Kell, M.D.  10/25/13- 42 yoF former smoker followed here with sarcoid Stage III,  Bronch NEG 09/24/08- dx based on mediastinal adenopathy/ interstitial disease, ACE 90, response to steroid. Iron def anemia FOLLOWS FOR: pt states her breathing has been fine. Has not been using Proair inhaler as she feels she has not needed it.  Has been off of prednisone over a year. Occasional irritant exposure triggers some cough, otherwise none. Denies fever, night sweats, rash or adenopathy.  Review of Systems-see HPI    Constitutional:  Per HPI HEENT:   No headaches,  Difficulty swallowing,  Tooth/dental problems,  Sore throat,                No sneezing, itching, ear ache, nasal congestion, post nasal drip,  CV:  No- chest pain,  No-PND, swelling in lower extremities, anasarca, dizziness, palpitations GI  No heartburn, indigestion, abdominal pain, nausea, vomiting,  Resp:  No acute shortness of breath with exertion .  No excess mucus, no productive cough,                         +minor non-productive cough,  No-coughing up of blood.  No change in color of mucus.  No wheezing.  Skin: no rash or lesions. GU: MS: joint pain .  No  decreased range of motion.  No back pain. Psych:  No change in mood or affect. No depression or anxiety.  No memory loss.  Objective:   Physical Exam General- Alert, Oriented, Affect-appropriate, Distress- none acute. Looks well.  Skin- + mild facial acne scarring, no rash, lesions- none, excoriation- none Lymphadenopathy- none Head- atraumatic            Eyes- Gross vision intact, PERRLA, conjunctivae clear secretions            Ears- Hearing, canals normal            Nose- Clear, No-Septal dev, mucus, polyps, erosion, perforation             Throat- Mallampati II , mucosa clear , drainage- none, tonsils present.               +Hyperpigmentation on hard palate. Neck- flexible , trachea midline, no stridor , thyroid nl, carotid no bruit Chest - symmetrical excursion , unlabored           Heart/CV- RRR , no murmur , no gallop  , no rub, nl s1 s2                           - JVD- none , edema- none, stasis changes- none, varices- none           Lung-   Cough-none, chest clear, unlabored, wheeze- none,  dullness-none, rub- none           Chest wall-  Abd-  Br/ Gen/ Rectal- Not done, not indicated Extrem- cyanosis- none, clubbing, none, atrophy- none, strength- nl Neuro- grossly intact to observation

## 2013-11-19 NOTE — Assessment & Plan Note (Signed)
Asthma/bronchitis-mild intermittent. This may reflect some residual sarcoid airway scarring. Plan-refill rescue inhaler to keep available

## 2013-11-19 NOTE — Assessment & Plan Note (Signed)
Looks like prolonged remission now off of prednisone Plan-chest x-ray

## 2013-12-11 ENCOUNTER — Encounter: Payer: Self-pay | Admitting: Internal Medicine

## 2013-12-11 ENCOUNTER — Other Ambulatory Visit (INDEPENDENT_AMBULATORY_CARE_PROVIDER_SITE_OTHER): Payer: BC Managed Care – PPO

## 2013-12-11 ENCOUNTER — Ambulatory Visit (INDEPENDENT_AMBULATORY_CARE_PROVIDER_SITE_OTHER): Payer: BC Managed Care – PPO | Admitting: Internal Medicine

## 2013-12-11 VITALS — BP 118/74 | HR 98 | Temp 98.5°F | Resp 16 | Ht 62.0 in | Wt 172.4 lb

## 2013-12-11 DIAGNOSIS — D509 Iron deficiency anemia, unspecified: Secondary | ICD-10-CM

## 2013-12-11 DIAGNOSIS — Z1231 Encounter for screening mammogram for malignant neoplasm of breast: Secondary | ICD-10-CM

## 2013-12-11 DIAGNOSIS — Z0000 Encounter for general adult medical examination without abnormal findings: Secondary | ICD-10-CM | POA: Insufficient documentation

## 2013-12-11 LAB — LIPID PANEL
Cholesterol: 207 mg/dL — ABNORMAL HIGH (ref 0–200)
HDL: 85.6 mg/dL (ref >39.00)
LDL Cholesterol: 110 mg/dL — ABNORMAL HIGH (ref 0–99)
Total CHOL/HDL Ratio: 2
Triglycerides: 57 mg/dL (ref 0.0–149.0)
VLDL: 11.4 mg/dL (ref 0.0–40.0)

## 2013-12-11 LAB — COMPREHENSIVE METABOLIC PANEL
ALBUMIN: 3.6 g/dL (ref 3.5–5.2)
ALT: 12 U/L (ref 0–35)
AST: 19 U/L (ref 0–37)
Alkaline Phosphatase: 43 U/L (ref 39–117)
BUN: 11 mg/dL (ref 6–23)
CO2: 25 meq/L (ref 19–32)
Calcium: 8.9 mg/dL (ref 8.4–10.5)
Chloride: 104 mEq/L (ref 96–112)
Creatinine, Ser: 0.7 mg/dL (ref 0.4–1.2)
GFR: 116.87 mL/min (ref >60.00)
GLUCOSE: 83 mg/dL (ref 70–99)
Potassium: 4 mEq/L (ref 3.5–5.1)
Sodium: 136 mEq/L (ref 135–145)
Total Bilirubin: 0.3 mg/dL (ref 0.3–1.2)
Total Protein: 8.3 g/dL (ref 6.0–8.3)

## 2013-12-11 LAB — URINALYSIS, ROUTINE W REFLEX MICROSCOPIC
Bilirubin Urine: NEGATIVE
Hgb urine dipstick: NEGATIVE
KETONES UR: NEGATIVE
LEUKOCYTES UA: NEGATIVE
NITRITE: NEGATIVE
SPECIFIC GRAVITY, URINE: 1.02 (ref 1.000–1.030)
Total Protein, Urine: NEGATIVE
UROBILINOGEN UA: 0.2 (ref 0.0–1.0)
Urine Glucose: NEGATIVE
pH: 6 (ref 5.0–8.0)

## 2013-12-11 LAB — CBC WITH DIFFERENTIAL/PLATELET
BASOS ABS: 0 10*3/uL (ref 0.0–0.1)
Basophils Relative: 0.6 % (ref 0.0–3.0)
EOS PCT: 2 % (ref 0.0–5.0)
Eosinophils Absolute: 0.1 10*3/uL (ref 0.0–0.7)
HCT: 35.7 % — ABNORMAL LOW (ref 36.0–46.0)
Hemoglobin: 11.6 g/dL — ABNORMAL LOW (ref 12.0–15.0)
LYMPHS ABS: 0.9 10*3/uL (ref 0.7–4.0)
LYMPHS PCT: 26.7 % (ref 12.0–46.0)
MCHC: 32.6 g/dL (ref 30.0–36.0)
MCV: 80.8 fl (ref 78.0–100.0)
Monocytes Absolute: 0.4 10*3/uL (ref 0.1–1.0)
Monocytes Relative: 10.5 % (ref 3.0–12.0)
NEUTROS PCT: 60.2 % (ref 43.0–77.0)
Neutro Abs: 2.1 10*3/uL (ref 1.4–7.7)
PLATELETS: 151 10*3/uL (ref 150.0–400.0)
RBC: 4.41 Mil/uL (ref 3.87–5.11)
RDW: 13.4 % (ref 11.5–14.6)
WBC: 3.4 10*3/uL — AB (ref 4.5–10.5)

## 2013-12-11 LAB — FERRITIN: Ferritin: 5.1 ng/mL — ABNORMAL LOW (ref 10.0–291.0)

## 2013-12-11 LAB — IBC PANEL
IRON: 38 ug/dL — AB (ref 42–145)
Saturation Ratios: 9.9 % — ABNORMAL LOW (ref 20.0–50.0)
Transferrin: 273.1 mg/dL (ref 212.0–360.0)

## 2013-12-11 LAB — HIV ANTIBODY (ROUTINE TESTING W REFLEX): HIV: NONREACTIVE

## 2013-12-11 LAB — TSH: TSH: 1.31 u[IU]/mL (ref 0.35–5.50)

## 2013-12-11 MED ORDER — FERRALET 90 90-1 MG PO TABS: 1.0000 | ORAL_TABLET | Freq: Every day | ORAL | Status: DC

## 2013-12-11 NOTE — Progress Notes (Signed)
Pre visit review using our clinic review tool, if applicable. No additional management support is needed unless otherwise documented below in the visit note. 

## 2013-12-11 NOTE — Assessment & Plan Note (Signed)
I will recheck her CBC and iron levels and will treat if needed

## 2013-12-11 NOTE — Patient Instructions (Signed)
Preventive Care for Adults, Female A healthy lifestyle and preventive care can promote health and wellness. Preventive health guidelines for women include the following key practices.  A routine yearly physical is a good way to check with your health care provider about your health and preventive screening. It is a chance to share any concerns and updates on your health and to receive a thorough exam.  Visit your dentist for a routine exam and preventive care every 6 months. Brush your teeth twice a day and floss once a day. Good oral hygiene prevents tooth decay and gum disease.  The frequency of eye exams is based on your age, health, family medical history, use of contact lenses, and other factors. Follow your health care provider's recommendations for frequency of eye exams.  Eat a healthy diet. Foods like vegetables, fruits, whole grains, low-fat dairy products, and lean protein foods contain the nutrients you need without too many calories. Decrease your intake of foods high in solid fats, added sugars, and salt. Eat the right amount of calories for you.Get information about a proper diet from your health care provider, if necessary.  Regular physical exercise is one of the most important things you can do for your health. Most adults should get at least 150 minutes of moderate-intensity exercise (any activity that increases your heart rate and causes you to sweat) each week. In addition, most adults need muscle-strengthening exercises on 2 or more days a week.  Maintain a healthy weight. The body mass index (BMI) is a screening tool to identify possible weight problems. It provides an estimate of body fat based on height and weight. Your health care provider can find your BMI, and can help you achieve or maintain a healthy weight.For adults 20 years and older:  A BMI below 18.5 is considered underweight.  A BMI of 18.5 to 24.9 is normal.  A BMI of 25 to 29.9 is considered overweight.  A  BMI of 30 and above is considered obese.  Maintain normal blood lipids and cholesterol levels by exercising and minimizing your intake of saturated fat. Eat a balanced diet with plenty of fruit and vegetables. Blood tests for lipids and cholesterol should begin at age 62 and be repeated every 5 years. If your lipid or cholesterol levels are high, you are over 50, or you are at high risk for heart disease, you may need your cholesterol levels checked more frequently.Ongoing high lipid and cholesterol levels should be treated with medicines if diet and exercise are not working.  If you smoke, find out from your health care provider how to quit. If you do not use tobacco, do not start.  Lung cancer screening is recommended for adults aged 36 80 years who are at high risk for developing lung cancer because of a history of smoking. A yearly low-dose CT scan of the lungs is recommended for people who have at least a 30-pack-year history of smoking and are a current smoker or have quit within the past 15 years. A pack year of smoking is smoking an average of 1 pack of cigarettes a day for 1 year (for example: 1 pack a day for 30 years or 2 packs a day for 15 years). Yearly screening should continue until the smoker has stopped smoking for at least 15 years. Yearly screening should be stopped for people who develop a health problem that would prevent them from having lung cancer treatment.  If you are pregnant, do not drink alcohol. If you  are breastfeeding, be very cautious about drinking alcohol. If you are not pregnant and choose to drink alcohol, do not have more than 1 drink per day. One drink is considered to be 12 ounces (355 mL) of beer, 5 ounces (148 mL) of wine, or 1.5 ounces (44 mL) of liquor.  Avoid use of street drugs. Do not share needles with anyone. Ask for help if you need support or instructions about stopping the use of drugs.  High blood pressure causes heart disease and increases the risk  of stroke. Your blood pressure should be checked at least every 1 to 2 years. Ongoing high blood pressure should be treated with medicines if weight loss and exercise do not work.  If you are 39 44 years old, ask your health care provider if you should take aspirin to prevent strokes.  Diabetes screening involves taking a blood sample to check your fasting blood sugar level. This should be done once every 3 years, after age 56, if you are within normal weight and without risk factors for diabetes. Testing should be considered at a younger age or be carried out more frequently if you are overweight and have at least 1 risk factor for diabetes.  Breast cancer screening is essential preventive care for women. You should practice "breast self-awareness." This means understanding the normal appearance and feel of your breasts and may include breast self-examination. Any changes detected, no matter how small, should be reported to a health care provider. Women in their 40s and 30s should have a clinical breast exam (CBE) by a health care provider as part of a regular health exam every 1 to 3 years. After age 28, women should have a CBE every year. Starting at age 72, women should consider having a mammogram (breast X-ray test) every year. Women who have a family history of breast cancer should talk to their health care provider about genetic screening. Women at a high risk of breast cancer should talk to their health care providers about having an MRI and a mammogram every year.  Breast cancer gene (BRCA)-related cancer risk assessment is recommended for women who have family members with BRCA-related cancers. BRCA-related cancers include breast, ovarian, tubal, and peritoneal cancers. Having family members with these cancers may be associated with an increased risk for harmful changes (mutations) in the breast cancer genes BRCA1 and BRCA2. Results of the assessment will determine the need for genetic counseling  and BRCA1 and BRCA2 testing.  The Pap test is a screening test for cervical cancer. A Pap test can show cell changes on the cervix that might become cervical cancer if left untreated. A Pap test is a procedure in which cells are obtained and examined from the lower end of the uterus (cervix).  Women should have a Pap test starting at age 59.  Between ages 42 and 13, Pap tests should be repeated every 2 years.  Beginning at age 53, you should have a Pap test every 3 years as long as the past 3 Pap tests have been normal.  Some women have medical problems that increase the chance of getting cervical cancer. Talk to your health care provider about these problems. It is especially important to talk to your health care provider if a new problem develops soon after your last Pap test. In these cases, your health care provider may recommend more frequent screening and Pap tests.  The above recommendations are the same for women who have or have not gotten the vaccine  for human papillomavirus (HPV).  If you had a hysterectomy for a problem that was not cancer or a condition that could lead to cancer, then you no longer need Pap tests. Even if you no longer need a Pap test, a regular exam is a good idea to make sure no other problems are starting.  If you are between ages 58 and 10 years, and you have had normal Pap tests going back 10 years, you no longer need Pap tests. Even if you no longer need a Pap test, a regular exam is a good idea to make sure no other problems are starting.  If you have had past treatment for cervical cancer or a condition that could lead to cancer, you need Pap tests and screening for cancer for at least 20 years after your treatment.  If Pap tests have been discontinued, risk factors (such as a new sexual partner) need to be reassessed to determine if screening should be resumed.  The HPV test is an additional test that may be used for cervical cancer screening. The HPV test  looks for the virus that can cause the cell changes on the cervix. The cells collected during the Pap test can be tested for HPV. The HPV test could be used to screen women aged 67 years and older, and should be used in women of any age who have unclear Pap test results. After the age of 65, women should have HPV testing at the same frequency as a Pap test.  Colorectal cancer can be detected and often prevented. Most routine colorectal cancer screening begins at the age of 25 years and continues through age 66 years. However, your health care provider may recommend screening at an earlier age if you have risk factors for colon cancer. On a yearly basis, your health care provider may provide home test kits to check for hidden blood in the stool. Use of a small camera at the end of a tube, to directly examine the colon (sigmoidoscopy or colonoscopy), can detect the earliest forms of colorectal cancer. Talk to your health care provider about this at age 79, when routine screening begins. Direct exam of the colon should be repeated every 5 10 years through age 47 years, unless early forms of pre-cancerous polyps or small growths are found.  People who are at an increased risk for hepatitis B should be screened for this virus. You are considered at high risk for hepatitis B if:  You were born in a country where hepatitis B occurs often. Talk with your health care provider about which countries are considered high risk.  Your parents were born in a high-risk country and you have not received a shot to protect against hepatitis B (hepatitis B vaccine).  You have HIV or AIDS.  You use needles to inject street drugs.  You live with, or have sex with, someone who has Hepatitis B.  You get hemodialysis treatment.  You take certain medicines for conditions like cancer, organ transplantation, and autoimmune conditions.  Hepatitis C blood testing is recommended for all people born from [birth year] through 1965 and  any individual with known risks for hepatitis C.  Practice safe sex. Use condoms and avoid high-risk sexual practices to reduce the spread of sexually transmitted infections (STIs). STIs include gonorrhea, chlamydia, syphilis, trichomonas, herpes, HPV, and human immunodeficiency virus (HIV). Herpes, HIV, and HPV are viral illnesses that have no cure. They can result in disability, cancer, and death. Sexually active women aged 66  years and younger should be checked for chlamydia. Older women with new or multiple partners should also be tested for chlamydia. Testing for other STIs is recommended if you are sexually active and at increased risk.  Osteoporosis is a disease in which the bones lose minerals and strength with aging. This can result in serious bone fractures or breaks. The risk of osteoporosis can be identified using a bone density scan. Women ages 18 years and over and women at risk for fractures or osteoporosis should discuss screening with their health care providers. Ask your health care provider whether you should take a calcium supplement or vitamin D to reduce the rate of osteoporosis.  Menopause can be associated with physical symptoms and risks. Hormone replacement therapy is available to decrease symptoms and risks. You should talk to your health care provider about whether hormone replacement therapy is right for you.  Use sunscreen. Apply sunscreen liberally and repeatedly throughout the day. You should seek shade when your shadow is shorter than you. Protect yourself by wearing long sleeves, pants, a wide-brimmed hat, and sunglasses year round, whenever you are outdoors.  Once a month, do a whole body skin exam, using a mirror to look at the skin on your back. Tell your health care provider of new moles, moles that have irregular borders, moles that are larger than a pencil eraser, or moles that have changed in shape or color.  Stay current with required vaccines  (immunizations).  Influenza vaccine. All adults should be immunized every year.  Tetanus, diphtheria, and acellular pertussis (Td, Tdap) vaccine. Pregnant women should receive 1 dose of Tdap vaccine during each pregnancy. The dose should be obtained regardless of the length of time since the last dose. Immunization is preferred during the 27th 36th week of gestation. An adult who has not previously received Tdap or who does not know her vaccine status should receive 1 dose of Tdap. This initial dose should be followed by tetanus and diphtheria toxoids (Td) booster doses every 10 years. Adults with an unknown or incomplete history of completing a 3-dose immunization series with Td-containing vaccines should begin or complete a primary immunization series including a Tdap dose. Adults should receive a Td booster every 10 years.  Varicella vaccine. An adult without evidence of immunity to varicella should receive 2 doses or a second dose if she has previously received 1 dose. Pregnant females who do not have evidence of immunity should receive the first dose after pregnancy. This first dose should be obtained before leaving the health care facility. The second dose should be obtained 4 8 weeks after the first dose.  Human papillomavirus (HPV) vaccine. Females aged 9 26 years who have not received the vaccine previously should obtain the 3-dose series. The vaccine is not recommended for use in pregnant females. However, pregnancy testing is not needed before receiving a dose. If a female is found to be pregnant after receiving a dose, no treatment is needed. In that case, the remaining doses should be delayed until after the pregnancy. Immunization is recommended for any person with an immunocompromised condition through the age of 51 years if she did not get any or all doses earlier. During the 3-dose series, the second dose should be obtained 4 8 weeks after the first dose. The third dose should be obtained  24 weeks after the first dose and 16 weeks after the second dose.  Zoster vaccine. One dose is recommended for adults aged 57 years or older unless certain  conditions are present.  Measles, mumps, and rubella (MMR) vaccine. Adults born before [birth year] generally are considered immune to measles and mumps. Adults born in [birth year] or later should have 1 or more doses of MMR vaccine unless there is a contraindication to the vaccine or there is laboratory evidence of immunity to each of the three diseases. A routine second dose of MMR vaccine should be obtained at least 28 days after the first dose for students attending postsecondary schools, health care workers, or international travelers. People who received inactivated measles vaccine or an unknown type of measles vaccine during 1963 1967 should receive 2 doses of MMR vaccine. People who received inactivated mumps vaccine or an unknown type of mumps vaccine before 1979 and are at high risk for mumps infection should consider immunization with 2 doses of MMR vaccine. For females of childbearing age, rubella immunity should be determined. If there is no evidence of immunity, females who are not pregnant should be vaccinated. If there is no evidence of immunity, females who are pregnant should delay immunization until after pregnancy. Unvaccinated health care workers born before [birth year] who lack laboratory evidence of measles, mumps, or rubella immunity or laboratory confirmation of disease should consider measles and mumps immunization with 2 doses of MMR vaccine or rubella immunization with 1 dose of MMR vaccine.  Pneumococcal 13-valent conjugate (PCV13) vaccine. When indicated, a person who is uncertain of her immunization history and has no record of immunization should receive the PCV13 vaccine. An adult aged 42 years or older who has certain medical conditions and has not been previously immunized should receive 1 dose of PCV13 vaccine. This PCV13 should be followed  with a dose of pneumococcal polysaccharide (PPSV23) vaccine. The PPSV23 vaccine dose should be obtained at least 8 weeks after the dose of PCV13 vaccine. An adult aged 4 years or older who has certain medical conditions and previously received 1 or more doses of PPSV23 vaccine should receive 1 dose of PCV13. The PCV13 vaccine dose should be obtained 1 or more years after the last PPSV23 vaccine dose.  Pneumococcal polysaccharide (PPSV23) vaccine. When PCV13 is also indicated, PCV13 should be obtained first. All adults aged 27 years and older should be immunized. An adult younger than age 33 years who has certain medical conditions should be immunized. Any person who resides in a nursing home or long-term care facility should be immunized. An adult smoker should be immunized. People with an immunocompromised condition and certain other conditions should receive both PCV13 and PPSV23 vaccines. People with human immunodeficiency virus (HIV) infection should be immunized as soon as possible after diagnosis. Immunization during chemotherapy or radiation therapy should be avoided. Routine use of PPSV23 vaccine is not recommended for American Indians, [place] Natives, or people younger than 65 years unless there are medical conditions that require PPSV23 vaccine. When indicated, people who have unknown immunization and have no record of immunization should receive PPSV23 vaccine. One-time revaccination 5 years after the first dose of PPSV23 is recommended for people aged 13 64 years who have chronic kidney failure, nephrotic syndrome, asplenia, or immunocompromised conditions. People who received 1 2 doses of PPSV23 before age 66 years should receive another dose of PPSV23 vaccine at age 27 years or later if at least 5 years have passed since the previous dose. Doses of PPSV23 are not needed for people immunized with PPSV23 at or after age 33 years.  Meningococcal vaccine. Adults with asplenia or persistent complement  component deficiencies should receive 2  doses of quadrivalent meningococcal conjugate (MenACWY-D) vaccine. The doses should be obtained at least 2 months apart. Microbiologists working with certain meningococcal bacteria, [place] recruits, people at risk during an outbreak, and people who travel to or live in countries with a high rate of meningitis should be immunized. A first-year college student up through age 49 years who is living in a residence hall should receive a dose if she did not receive a dose on or after her 16th birthday. Adults who have certain high-risk conditions should receive one or more doses of vaccine.  Hepatitis A vaccine. Adults who wish to be protected from this disease, have certain high-risk conditions, work with hepatitis A-infected animals, work in hepatitis A research labs, or travel to or work in countries with a high rate of hepatitis A should be immunized. Adults who were previously unvaccinated and who anticipate close contact with an international adoptee during the first 60 days after arrival in the [place] from a country with a high rate of hepatitis A should be immunized.  Hepatitis B vaccine. Adults who wish to be protected from this disease, have certain high-risk conditions, may be exposed to blood or other infectious body fluids, are household contacts or sex partners of hepatitis B positive people, are clients or workers in certain care facilities, or travel to or work in countries with a high rate of hepatitis B should be immunized.  Haemophilus influenzae type b (Hib) vaccine. A previously unvaccinated person with asplenia or sickle cell disease or having a scheduled splenectomy should receive 1 dose of Hib vaccine. Regardless of previous immunization, a recipient of a hematopoietic stem cell transplant should receive a 3-dose series 6 12 months after her successful transplant. Hib vaccine is not recommended for adults with HIV infection. Preventive  Services / Frequency Ages 24 to 39years  Blood pressure check.** / Every 1 to 2 years.  Lipid and cholesterol check.** / Every 5 years beginning at age 66.  Clinical breast exam.** / Every 3 years for women in their 12s and 24s.  BRCA-related cancer risk assessment.** / For women who have family members with a BRCA-related cancer (breast, ovarian, tubal, or peritoneal cancers).  Pap test.** / Every 2 years from ages 31 through 69. Every 3 years starting at age 64 through age 76 or 89 with a history of 3 consecutive normal Pap tests.  HPV screening.** / Every 3 years from ages 10 through ages 10 to 96 with a history of 3 consecutive normal Pap tests.  Hepatitis C blood test.** / For any individual with known risks for hepatitis C.  Skin self-exam. / Monthly.  Influenza vaccine. / Every year.  Tetanus, diphtheria, and acellular pertussis (Tdap, Td) vaccine.** / Consult your health care provider. Pregnant women should receive 1 dose of Tdap vaccine during each pregnancy. 1 dose of Td every 10 years.  Varicella vaccine.** / Consult your health care provider. Pregnant females who do not have evidence of immunity should receive the first dose after pregnancy.  HPV vaccine. / 3 doses over 6 months, if 90 and younger. The vaccine is not recommended for use in pregnant females. However, pregnancy testing is not needed before receiving a dose.  Measles, mumps, rubella (MMR) vaccine.** / You need at least 1 dose of MMR if you were born in 1957 or later. You may also need a 2nd dose. For females of childbearing age, rubella immunity should be determined. If there is no evidence of immunity, females who are not  pregnant should be vaccinated. If there is no evidence of immunity, females who are pregnant should delay immunization until after pregnancy.  Pneumococcal 13-valent conjugate (PCV13) vaccine.** / Consult your health care provider.  Pneumococcal polysaccharide (PPSV23) vaccine.** / 1 to 2  doses if you smoke cigarettes or if you have certain conditions.  Meningococcal vaccine.** / 1 dose if you are age 88 to 6 years and a Market researcher living in a residence hall, or have one of several medical conditions, you need to get vaccinated against meningococcal disease. You may also need additional booster doses.  Hepatitis A vaccine.** / Consult your health care provider.  Hepatitis B vaccine.** / Consult your health care provider.  Haemophilus influenzae type b (Hib) vaccine.** / Consult your health care provider. Ages 23 to 64years  Blood pressure check.** / Every 1 to 2 years.  Lipid and cholesterol check.** / Every 5 years beginning at age 20 years.  Lung cancer screening. / Every year if you are aged 51 80 years and have a 30-pack-year history of smoking and currently smoke or have quit within the past 15 years. Yearly screening is stopped once you have quit smoking for at least 15 years or develop a health problem that would prevent you from having lung cancer treatment.  Clinical breast exam.** / Every year after age 8 years.  BRCA-related cancer risk assessment.** / For women who have family members with a BRCA-related cancer (breast, ovarian, tubal, or peritoneal cancers).  Mammogram.** / Every year beginning at age 10 years and continuing for as long as you are in good health. Consult with your health care provider.  Pap test.** / Every 3 years starting at age 30 years through age 5 or 61 years with a history of 3 consecutive normal Pap tests.  HPV screening.** / Every 3 years from ages 39 years through ages 72 to 19 years with a history of 3 consecutive normal Pap tests.  Fecal occult blood test (FOBT) of stool. / Every year beginning at age 59 years and continuing until age 27 years. You may not need to do this test if you get a colonoscopy every 10 years.  Flexible sigmoidoscopy or colonoscopy.** / Every 5 years for a flexible sigmoidoscopy or every  10 years for a colonoscopy beginning at age 110 years and continuing until age 63 years.  Hepatitis C blood test.** / For all people born from [birth year] through 1966 and any individual with known risks for hepatitis C.  Skin self-exam. / Monthly.  Influenza vaccine. / Every year.  Tetanus, diphtheria, and acellular pertussis (Tdap/Td) vaccine.** / Consult your health care provider. Pregnant women should receive 1 dose of Tdap vaccine during each pregnancy. 1 dose of Td every 10 years.  Varicella vaccine.** / Consult your health care provider. Pregnant females who do not have evidence of immunity should receive the first dose after pregnancy.  Zoster vaccine.** / 1 dose for adults aged 46 years or older.  Measles, mumps, rubella (MMR) vaccine.** / You need at least 1 dose of MMR if you were born in 1957 or later. You may also need a 2nd dose. For females of childbearing age, rubella immunity should be determined. If there is no evidence of immunity, females who are not pregnant should be vaccinated. If there is no evidence of immunity, females who are pregnant should delay immunization until after pregnancy.  Pneumococcal 13-valent conjugate (PCV13) vaccine.** / Consult your health care provider.  Pneumococcal polysaccharide (PPSV23) vaccine.** / 1  to 2 doses if you smoke cigarettes or if you have certain conditions.  Meningococcal vaccine.** / Consult your health care provider.  Hepatitis A vaccine.** / Consult your health care provider.  Hepatitis B vaccine.** / Consult your health care provider.  Haemophilus influenzae type b (Hib) vaccine.** / Consult your health care provider. Ages 65 years and over  Blood pressure check.** / Every 1 to 2 years.  Lipid and cholesterol check.** / Every 5 years beginning at age 20 years.  Lung cancer screening. / Every year if you are aged 55 80 years and have a 30-pack-year history of smoking and currently smoke or have quit within the past 15 years.  Yearly screening is stopped once you have quit smoking for at least 15 years or develop a health problem that would prevent you from having lung cancer treatment.  Clinical breast exam.** / Every year after age 40 years.  BRCA-related cancer risk assessment.** / For women who have family members with a BRCA-related cancer (breast, ovarian, tubal, or peritoneal cancers).  Mammogram.** / Every year beginning at age 40 years and continuing for as long as you are in good health. Consult with your health care provider.  Pap test.** / Every 3 years starting at age 30 years through age 65 or 70 years with 3 consecutive normal Pap tests. Testing can be stopped between 65 and 70 years with 3 consecutive normal Pap tests and no abnormal Pap or HPV tests in the past 10 years.  HPV screening.** / Every 3 years from ages 30 years through ages 65 or 70 years with a history of 3 consecutive normal Pap tests. Testing can be stopped between 65 and 70 years with 3 consecutive normal Pap tests and no abnormal Pap or HPV tests in the past 10 years.  Fecal occult blood test (FOBT) of stool. / Every year beginning at age 50 years and continuing until age 75 years. You may not need to do this test if you get a colonoscopy every 10 years.  Flexible sigmoidoscopy or colonoscopy.** / Every 5 years for a flexible sigmoidoscopy or every 10 years for a colonoscopy beginning at age 50 years and continuing until age 75 years.  Hepatitis C blood test.** / For all people born from 1945 through 1965 and any individual with known risks for hepatitis C.  Osteoporosis screening.** / A one-time screening for women ages 65 years and over and women at risk for fractures or osteoporosis.  Skin self-exam. / Monthly.  Influenza vaccine. / Every year.  Tetanus, diphtheria, and acellular pertussis (Tdap/Td) vaccine.** / 1 dose of Td every 10 years.  Varicella vaccine.** / Consult your health care provider.  Zoster vaccine.** / 1  dose for adults aged 60 years or older.  Pneumococcal 13-valent conjugate (PCV13) vaccine.** / Consult your health care provider.  Pneumococcal polysaccharide (PPSV23) vaccine.** / 1 dose for all adults aged 65 years and older.  Meningococcal vaccine.** / Consult your health care provider.  Hepatitis A vaccine.** / Consult your health care provider.  Hepatitis B vaccine.** / Consult your health care provider.  Haemophilus influenzae type b (Hib) vaccine.** / Consult your health care provider. ** Family history and personal history of risk and conditions may change your health care provider's recommendations. Document Released: 10/18/2001 Document Revised: 06/12/2013 Document Reviewed: 01/17/2011 [employer] Patient Information 2014 [employer]. Iron Deficiency Anemia, Adult Anemia is a condition in which there are less red blood cells or hemoglobin in the blood than normal. Hemoglobin is   this part of red blood cells that carries oxygen. Iron deficiency anemia is anemia caused by too little iron. It is the most common type of anemia. It may leave you tired and short of breath. CAUSES   Lack of iron in the diet.  Poor absorption of iron, as seen with intestinal disorders.  Intestinal bleeding.  Heavy periods. SIGNS AND SYMPTOMS  Mild anemia may not be noticeable. Symptoms may include:  Fatigue.  Headache.  Pale skin.  Weakness.  Tiredness.  Shortness of breath.  Dizziness.  Cold hands and feet.  Fast or irregular heartbeat. DIAGNOSIS  Diagnosis requires a thorough evaluation and physical exam by your health care provider. Blood tests are generally used to confirm iron deficiency anemia. Additional tests may be done to find the underlying cause of your anemia. These may include:  Testing for blood in the stool (fecal occult blood test).  A procedure to see inside the colon and rectum (colonoscopy).  A procedure to see inside the esophagus and stomach  (endoscopy). TREATMENT  Iron deficiency anemia is treated by correcting the cause of the deficiency. Treatment may involve:  Adding iron-rich foods to your diet.  Taking iron supplements. Pregnant or breastfeeding women need to take extra iron, because their normal diet usually does not provide the required amount.  Taking vitamins. Vitamin C improves the absorption of iron. Your health care provider may recommend taking your iron tablets with a glass of orange juice or vitamin C supplement.  Medicines to make heavy menstrual flow lighter.  Surgery. HOME CARE INSTRUCTIONS   Take iron as directed by your health care provider.  If you cannot tolerate taking iron supplements by mouth, talk to your health care provider about taking them through a vein (intravenously) or an injection into a muscle.  For the best iron absorption, iron supplements should be taken on an empty stomach. If you cannot tolerate them on an empty stomach, you may need to take them with food.  Do not drink milk or take antacids at the same time as your iron supplements. Milk and antacids may interfere with the absorption of iron.  Iron supplements can cause constipation. Make sure to include fiber in your diet to prevent constipation. A stool softener may also be recommended.  Take vitamins as directed by your health care provider.  Eat a diet rich in iron. Foods high in iron include liver, lean beef, whole-grain bread, eggs, dried fruit, and dark green, leafy vegetables. SEEK IMMEDIATE MEDICAL CARE IF:   You faint. If this happens, do not drive. Call your local emergency services (911 in [place] if no other help is available.  You have chest pain.  You feel nauseous or vomit.  You have severe or increased shortness of breath with activity.  You feel weak.  You have a rapid heartbeat.  You have unexplained sweating.  You become lightheaded when getting up from a chair or bed. MAKE SURE YOU:   Understand  these instructions.  Will watch your condition.  Will get help right away if you are not doing well or get worse. Document Released: 08/19/2000 Document Revised: 06/12/2013 Document Reviewed: 04/29/2013 [facility] Patient Information 2014 [place].

## 2013-12-11 NOTE — Assessment & Plan Note (Signed)
Exam done Vaccines were reviewed Labs ordered - she requested that an HIV test be done She was referred for a mammogram Pt ed material was given

## 2013-12-11 NOTE — Progress Notes (Signed)
Subjective:    Patient ID: Nancy Villarreal, female    DOB: October 21, 1970, 43 y.o.   MRN: [id number]  Anemia Presents for follow-up visit. The condition has lasted for 3 years. Symptoms include malaise/fatigue. There has been no abdominal pain, anorexia, bruising/bleeding easily, confusion, fever, leg swelling, light-headedness, pallor, palpitations, paresthesias, pica or weight loss. Signs of blood loss that are present include menorrhagia. Signs of blood loss that are not present include hematemesis, hematochezia, melena and vaginal bleeding. Past treatments include nothing.      Review of Systems  Constitutional: Positive for malaise/fatigue. Negative for fever and weight loss.  HENT: Negative.  Negative for nosebleeds.   Eyes: Negative.   Respiratory: Negative.  Negative for apnea, cough, choking, chest tightness, shortness of breath, wheezing and stridor.   Cardiovascular: Negative.  Negative for chest pain, palpitations and leg swelling.  Gastrointestinal: Negative.  Negative for nausea, vomiting, abdominal pain, diarrhea, constipation, blood in stool, melena, hematochezia, anorexia and hematemesis.  Endocrine: Negative.   Genitourinary: Positive for menorrhagia. Negative for dysuria, hematuria, flank pain and vaginal bleeding.  Musculoskeletal: Negative.   Skin: Negative.  Negative for color change and pallor.  Allergic/Immunologic: Negative.   Neurological: Negative.  Negative for dizziness, tremors, weakness, light-headedness, numbness, headaches and paresthesias.  Hematological: Negative.  Negative for adenopathy. Does not bruise/bleed easily.  Psychiatric/Behavioral: Negative.  Negative for confusion.       Objective:   Physical Exam  Vitals reviewed. Constitutional: She is oriented to person, place, and time. She appears well-developed and well-nourished. No distress.  HENT:  Head: Normocephalic and atraumatic.  Mouth/Throat: Oropharynx is clear and moist. No  oropharyngeal exudate.  Eyes: Conjunctivae are normal. Right eye exhibits no discharge. Left eye exhibits no discharge. No scleral icterus.  Neck: Normal range of motion. Neck supple. No JVD present. No tracheal deviation present. No thyromegaly present.  Cardiovascular: Normal rate, regular rhythm, normal heart sounds and intact distal pulses.  Exam reveals no gallop and no friction rub.   No murmur heard. Pulmonary/Chest: Effort normal and breath sounds normal. No accessory muscle usage or stridor. Not tachypneic. No respiratory distress. She has no wheezes. She has no rales. Chest wall is not dull to percussion. She exhibits no mass, no tenderness, no bony tenderness, no laceration, no crepitus, no edema, no deformity, no swelling and no retraction. Right breast exhibits no inverted nipple, no mass, no nipple discharge, no skin change and no tenderness. Left breast exhibits no inverted nipple, no mass, no nipple discharge, no skin change and no tenderness. Breasts are symmetrical.  Abdominal: Soft. Bowel sounds are normal. She exhibits no distension and no mass. There is no tenderness. There is no rebound and no guarding.  Musculoskeletal: Normal range of motion. She exhibits no edema and no tenderness.  Lymphadenopathy:    She has no cervical adenopathy.  Neurological: She is oriented to person, place, and time.  Skin: Skin is warm and dry. No rash noted. She is not diaphoretic. No erythema. No pallor.  Psychiatric: She has a normal mood and affect. Her behavior is normal. Judgment and thought content normal.     Lab Results  Component Value Date   WBC 3.2* 03/07/2012   HGB 11.9* 03/07/2012   HCT 37.3 03/07/2012   PLT 139.0* 03/07/2012   GLUCOSE 85 12/10/2012   ALT 12 12/10/2012   AST 19 12/10/2012   NA 135 12/10/2012   K 3.8 12/10/2012   CL 105 12/10/2012   CREATININE 0.8 12/10/2012  BUN 10 12/10/2012   CO2 25 12/10/2012   TSH 1.41 03/07/2012   INR 1.1 RATIO* 09/03/2008       Assessment & Plan:

## 2013-12-11 NOTE — Addendum Note (Signed)
Addended by: Etta GrandchildJONES, Leslieann Whisman L on: 12/11/2013 12:00 PM   Modules accepted: Orders

## 2013-12-12 ENCOUNTER — Encounter: Payer: Self-pay | Admitting: Internal Medicine

## 2013-12-25 ENCOUNTER — Telehealth: Payer: Self-pay

## 2013-12-25 NOTE — Telephone Encounter (Signed)
Called pharmacy to see if they had received rx 12/11/2013. Pharmacy stated that rthey had. But had not filled yet. Pt was notified.

## 2013-12-25 NOTE — Telephone Encounter (Signed)
The patient is hoping to get her iron pill refilled [facility] [address]   Patient callback - [phone]

## 2013-12-26 ENCOUNTER — Ambulatory Visit (INDEPENDENT_AMBULATORY_CARE_PROVIDER_SITE_OTHER)
Admission: RE | Admit: 2013-12-26 | Discharge: 2013-12-26 | Disposition: A | Discharge status: Home / Self Care | Payer: BC Managed Care – PPO | Source: Ambulatory Visit | Attending: Internal Medicine | Admitting: Internal Medicine

## 2013-12-26 ENCOUNTER — Ambulatory Visit (INDEPENDENT_AMBULATORY_CARE_PROVIDER_SITE_OTHER): Payer: BC Managed Care – PPO | Admitting: Internal Medicine

## 2013-12-26 ENCOUNTER — Telehealth: Payer: Self-pay | Admitting: Internal Medicine

## 2013-12-26 ENCOUNTER — Other Ambulatory Visit (INDEPENDENT_AMBULATORY_CARE_PROVIDER_SITE_OTHER): Payer: BC Managed Care – PPO

## 2013-12-26 ENCOUNTER — Encounter: Payer: Self-pay | Admitting: Internal Medicine

## 2013-12-26 VITALS — BP 118/70 | HR 75 | Ht 63.0 in | Wt 172.6 lb

## 2013-12-26 DIAGNOSIS — R042 Hemoptysis: Secondary | ICD-10-CM

## 2013-12-26 DIAGNOSIS — D869 Sarcoidosis, unspecified: Secondary | ICD-10-CM

## 2013-12-26 LAB — CBC WITH DIFFERENTIAL/PLATELET
BASOS PCT: 0.5 % (ref 0.0–3.0)
Basophils Absolute: 0 10*3/uL (ref 0.0–0.1)
EOS ABS: 0 10*3/uL (ref 0.0–0.7)
EOS PCT: 0.2 % (ref 0.0–5.0)
HCT: 37.9 % (ref 36.0–46.0)
HEMOGLOBIN: 12.3 g/dL (ref 12.0–15.0)
LYMPHS PCT: 36.5 % (ref 12.0–46.0)
Lymphs Abs: 1.6 10*3/uL (ref 0.7–4.0)
MCHC: 32.4 g/dL (ref 30.0–36.0)
MCV: 81.1 fl (ref 78.0–100.0)
Monocytes Absolute: 0.4 10*3/uL (ref 0.1–1.0)
Monocytes Relative: 9.2 % (ref 3.0–12.0)
NEUTROS ABS: 2.4 10*3/uL (ref 1.4–7.7)
NEUTROS PCT: 53.6 % (ref 43.0–77.0)
Platelets: 204 10*3/uL (ref 150.0–400.0)
RBC: 4.67 Mil/uL (ref 3.87–5.11)
RDW: 13.6 % (ref 11.5–14.6)
WBC: 4.4 10*3/uL — AB (ref 4.5–10.5)

## 2013-12-26 MED ORDER — AZITHROMYCIN 250 MG PO TABS: ORAL_TABLET | ORAL | Status: DC

## 2013-12-26 MED ORDER — METHYLPREDNISOLONE ACETATE 80 MG/ML IJ SUSP
80.0000 mg | Freq: Once | INTRAMUSCULAR | Status: AC
  Administered 2013-12-26: 80 mg via INTRAMUSCULAR

## 2013-12-26 NOTE — Patient Instructions (Addendum)
Depo 80  Script Zpak antibiotic sent  Order lab- CBC w/ diff, ACE level     Dx sarcoid

## 2013-12-26 NOTE — Telephone Encounter (Signed)
Per CY---ok to add pt on today at 2 and do cxr prior to appt.  Called and spoke with pt and she will be here for her appt today.

## 2013-12-26 NOTE — Progress Notes (Signed)
Patient ID: Nancy Villarreal, female    DOB: July 07, 1971, 43 y.o.   MRN: [id number]  HPI 11/30/10- [age] yoF former smoker followed here with sarcoid, currently maintained on prednisone 5 mg every other day Bronch NEG 09/24/08- dx based on mediastinal adenopathy/ interstitial disease, ACE 90, response to steroids . She was put back on daily prednisone at 10 mg daily in May, 2011 for ACE 111.  Has had hemoptysis in past, allergic rhinitis and asthma. In September, 2011 was doing very well and was changed to 5 mg every other day. Felt well here January, 2012. Has had some hx asthma and allergic rhinitis. CXR 09/07/10 showed stable Stage III intersiittial scarring unchanged back to 09/11/08. In last 2 weeks has noted increased dyspnea with wheeze lying down, night sweats, joint pains, fatigue, hair falling out, frightened that she couldn't breathe, dyspnea walking parking lot. Last night propped up to sleep. Denies chest pain, palpitation, rash or nodes, leg swelling or pain.    02/15/11- Sarcoid Stage III, hx asthma, rhinitis She feels she is doing well, still maintaining on prednisone 5 mg every other day. She is comfortable with that and feels well. Admits occasional night sweat. Denies rash, cough, nodes.  We discussed how and when to wean off steroids.  05/04/11-  [age] yoF former smoker followed here with sarcoid,  Bronch NEG 09/24/08- dx based on mediastinal adenopathy/ interstitial disease, ACE 90, response to steroids. She called after coughing up a mouthfull of blood on 1 night a week ago. She kept on coughing, but there was no more blood. She still coughs, back to baseline. Notes temps up to 100 at night with some sweats, no nodes or rash. Aware of some bones aching- especially one area lateral left calf at times- nothing visible. More frequent heart burn. Some DOE. Feeling tired x 1 month- wants to sleep. Sweats and ache in shoulders affect sleep comfort.  We reviewed her ACE levels and prior xray images   and discussed her concerns about prednisone.  06/21/11- [age] yoF former smoker followed here with sarcoid,  Bronch NEG 09/24/08- dx based on mediastinal adenopathy/ interstitial disease, ACE 90, response to steroid. She says she feels fine and has had her flu vaccine. Now exercising and lost 5 pounds. She has continued prednisone 10 mg every other day since last here. We reviewed recent labs including bone density which was within normal, and CT scan noted below. Bone Density- Normal- 04/27/11 IMPRESSION: CT CHEST 05/06/11 Upper lobe predominant fibrosis with calcified mediastinal and  bilateral hilar lymph nodes, compatible with known sarcoidosis.  Scattered mild tree-in-bud nodules in the right lower lobe, likely  related to sarcoid, mild superimposed infectious process not  excluded.  Original Report Authenticated By: Charline BillsSRIYESH KRISHNAN, M.D.   09/22/11-  [age] yoF former smoker followed here with sarcoid Stage III,  Bronch NEG 09/24/08- dx based on mediastinal adenopathy/ interstitial disease, ACE 90, response to steroid. Has continued taking prednisone 5 mg every other day. Feels well except for dry skin. Coughs only when she exercises. Denies fever or sweat. PFT: 07/14/2011-normal spirometry flows, slight response to bronchodilator, normal lung volumes, diffusion moderately reduced. FEV1/FVC 0.84, DLCO 55%. We reviewed again the last CT scan and I printed a representative imaged for her to show family.  12/29/11-  [age] yoF former smoker followed here with sarcoid Stage III,  Bronch NEG 09/24/08- dx based on mediastinal adenopathy/ interstitial disease, ACE 90, response to steroid. Her dentist noticed hyperpigmentation on the hard palate. She  complains of fatigue despite adequate sleep. "No energy" not really sleepiness. Denies fever, weight loss, adenopathy, pain. Some increased dry cough without wheeze. She has continued prednisone 5 mg daily. Off of her inhaler "lost it". ACE: 3 months 62, 6 months 67,  7 months 92, 8 months 55, 1 year 55.  02/10/12-  40 yoF former smoker followed here with sarcoid Stage III,  Bronch NEG 09/24/08- dx based on mediastinal adenopathy/ interstitial disease, ACE 90, response to steroid. Iron def anemia SOB and wheezing with cough and congestion comes and goes. Labs from 01/04/2012 included iron low/28, transferrin normal /282. No overt blood loss. She is now taking iron and as directed to discuss this with a primary physician. She needs to establish so we will refer her. She no longer feels run down. "Just a little" chest congestion blamed on the weather. Occasional night sweat possibly from hormones. No adenopathy  06/11/12- 41 yoF former smoker followed here with sarcoid Stage III,  Bronch NEG 09/24/08- dx based on mediastinal adenopathy/ interstitial disease, ACE 90, response to steroid. Iron def anemia Has not taken prednisone since June-no flare ups. She has been feeling very well. She realized she had forgotten prednisone and noticed she was doing well so she deliberately dropped off. We reviewed symptoms suggestive of reactivated sarcoid and she denies all.  12/10/12- [age] yoF former smoker followed here with sarcoid Stage III,  Bronch NEG 09/24/08- dx based on mediastinal adenopathy/ interstitial disease, ACE 90, response to steroid. Iron def anemia FOLLOWS ZOX:WRUEAVWFOR:started coughing up blood since last visit; deep cough in chest Hemoptysis for the first time in over a year, 2 or 3 occasions less than 1 teaspoon. Off of prednisone since June of 2013. Increased cough since a cold 2 weeks ago. Some sweats. Feels tired. She is dieting. No adenopathy. Vague aching persistent chest pain sternum through to back at least for several days. Angiotensin-Converting Enzyme 8 - 52 U/L  86 (H) 06/11/12    68 (H)    62 (H)    67 (H)    92 (H)    85 (H)    59 (H) 11/30/10   CXR 06/20/12-IMPRESSION:  1. Nodularity and architectural distortion in the lungs, stable  and compatible  with sarcoidosis.  Original Report Authenticated By: Dellia CloudWALTER D. LIEBKEMANN, M.D.  01/09/13- [age] yoF former smoker followed here with sarcoid Stage III,  Bronch NEG 09/24/08- dx based on mediastinal adenopathy/ interstitial disease, ACE 90, response to steroid. Iron def anemia Chest feels ok. Slight cough, scant clear mucus. Not on prednisone. Still night sweat. No rash, nodes. ACE- 12/10/12- 69    Renal and liver function normal. CT chest 12/13/12-  IMPRESSION:  1. No acute findings.  2. Upper lobe predominant fibrotic changes and perilymphatic  nodularity consistent with sarcoid. The overall extent of  pulmonary sarcoid is not significantly changed from previous exam.  3. Stable calcified and noncalcified mediastinal and hilar lymph  nodes.  Original Report Authenticated By: Signa Kellaylor Stroud, M.D.  10/25/13- 42 yoF former smoker followed here with sarcoid Stage III,  Bronch NEG 09/24/08- dx based on mediastinal adenopathy/ interstitial disease, ACE 90, response to steroid. Iron def anemia FOLLOWS FOR: pt states her breathing has been fine. Has not been using Proair inhaler as she feels she has not needed it.  Has been off of prednisone over a year. Occasional irritant exposure triggers some cough, otherwise none. Denies fever, night sweats, rash or adenopathy.  12/26/13- [age] yoF former smoker followed here with  sarcoid Stage III,  Bronch NEG 09/24/08- dx based on mediastinal adenopathy/ interstitial disease, ACE 90, response to steroid. Iron def anemia Female companion here FOLLOWS FOR: Pt c/o hemopytsis, starting yesterday afternoon pt states it was mild cough with little blood. Pt states last night she had much heavier coughing, while coughing up "about a tsp of blood". Pt c/o chest tightness and pressure with and without activty. Pt constantly feels the urge to cough. Denies SOB, chest pain, palpitation, leg discomfort, other bruising or bleeding. Not on aspirin. CXR 10/29/13 IMPRESSION:  No active  cardiopulmonary disease. Stable chronic reticulonodular  interstitial prominence.  Electronically Signed  By: Natasha Mead M.D.  On: 10/25/2013 09:38 CXR 12/26/13- today IMPRESSION:  1. Superimposed airspace disease on chronic interstitial coarsening  in the right lower lobe is concerning for bronchopneumonia.  2. Diffuse interstitial disease is compatible with chronic  sarcoidosis.  Electronically Signed  By: Gennette Pac M.D.  On: 12/26/2013 14:32 yes  Review of Systems-see HPI    Constitutional:  Per HPI HEENT:   No headaches,  Difficulty swallowing,  Tooth/dental problems,  Sore throat,                No sneezing, itching, ear ache, nasal congestion, post nasal drip,  CV:  No- chest pain,  No-PND, swelling in lower extremities, anasarca, dizziness, palpitations GI  No heartburn, indigestion, abdominal pain, nausea, vomiting,  Resp:  No acute shortness of breath with exertion .  No excess mucus, no productive cough,                         +minor non-productive cough,  +coughing up of blood.  No change in color of mucus.  No wheezing.  Skin: no rash or lesions. GU: MS: joint pain .  No decreased range of motion.  No back pain. Psych:  No change in mood or affect. No depression or anxiety.  No memory loss.  Objective:   Physical Exam General- Alert, Oriented, Affect-appropriate, Distress- none acute. Looks well.  Skin-  no rash, lesions- none, excoriation- none Lymphadenopathy- none Head- atraumatic            Eyes- Gross vision intact, PERRLA, conjunctivae clear secretions            Ears- Hearing, canals normal            Nose- Clear, No-Septal dev, mucus, polyps, erosion, perforation             Throat- Mallampati II , mucosa clear , drainage- none, tonsils present.                                      +Hyperpigmentation on hard palate. Neck- flexible , trachea midline, no stridor , thyroid nl, carotid no bruit Chest - symmetrical excursion , unlabored           Heart/CV-  RRR , no murmur , no gallop  , no rub, nl s1 s2                           - JVD- none , edema- none, stasis changes- none, varices- none           Lung-   Cough-none, chest clear, unlabored, wheeze- none,  dullness-none, rub- none           Chest wall-  Abd-  Br/ Gen/ Rectal- Not done, not indicated Extrem- cyanosis- none, clubbing, none, atrophy- none, strength- nl Neuro- grossly intact to observation

## 2013-12-27 ENCOUNTER — Telehealth: Payer: Self-pay

## 2013-12-27 LAB — ANGIOTENSIN CONVERTING ENZYME: Angiotensin-Converting Enzyme: 99 U/L — ABNORMAL HIGH (ref 8–52)

## 2013-12-27 NOTE — Assessment & Plan Note (Signed)
Activity uncertain Plan-Depo-Medrol, CBC, ACE level

## 2013-12-27 NOTE — Assessment & Plan Note (Signed)
Probably benign hemorrhagic bronchitis from sarcoid. I don't see any major cavity. Minor shadow in the right base is more likely a little aspirated blood than pneumonia.  Plan-Z-Pak, Depo-Medrol, CBC, ACE level

## 2013-12-27 NOTE — Telephone Encounter (Signed)
Spoke with pt, she is aware of results.  She has not been coughing up any more blood since last visit.  I advised her to keep a close eye on her sputum and call us if her s/s fail to improve or worsen.  She understands this.  Nothing further at this time.

## 2013-12-30 ENCOUNTER — Telehealth: Payer: Self-pay | Admitting: Internal Medicine

## 2013-12-30 NOTE — Telephone Encounter (Signed)
Pt was seen by CY on 12/26/13 with with following instructions:  Patient Instructions     Depo 80  Script Zpak antibiotic sent  Order lab- CBC w/ diff, ACE level Dx sarcoid   Pt was called on Friday with lab results:  Result Notes    Notes Recorded by Waymon Budgelinton D Young, MD on 12/27/2013 at 8:58 AM Lab- CBC is ok. ACE level is 99, which is the highest it has been in a couple of years (Nl is less than 52). That suggests sarcoid is more active right now.   ------  Pt would like to know what the next plan of action should be for the increased ACE levels.  Pt reports usually she is given prednisone for this.  She doesn't want this to "reck havoc on already damaged lungs."  Also, pt would like to know when she needs to follow up with Dr. Maple HudsonYoung.  There is no f/u time frame in her instructions.  Dr. Maple HudsonYoung, pls advise.  Thank you.  No Known Allergies   Current Outpatient Prescriptions on File Prior to Visit  Medication Sig Dispense Refill  . albuterol (PROAIR HFA) 108 (90 BASE) MCG/ACT inhaler 2 puffs  Every 4 hours if needed- rescue  1 each  prn  . azithromycin (ZITHROMAX) 250 MG tablet 2 today then one daily  6 tablet  0  . Fe Cbn-Fe Gluc-FA-B12-C-DSS (FERRALET 90) 90-1 MG TABS Take 1 tablet by mouth daily.  90 each  3  . ferrous sulfate 325 (65 FE) MG tablet Take 325 mg by mouth daily with breakfast.       No current facility-administered medications on file prior to visit.

## 2013-12-30 NOTE — Telephone Encounter (Signed)
The slow release steroid shot she got will help with the sarcoid also. She was to keep her next scheduled appointment in February. Since this acute episode drew our attention to her ACE level, I would like to see her again in about 3-4 weeks, after the depo has worn off, to decide what to do longer term.

## 2013-12-30 NOTE — Telephone Encounter (Signed)
Spoke with patient-she is aware that CY wants to see her back in about 3-4 weeks from last OV once the steroid shot has worn off. Pt was set up for Wednesday 01-22-14 at 11:15am. If patient needs to be seen sooner or having any flare ups patient will contact our office. Nothing more needed at this time.

## 2014-01-07 ENCOUNTER — Ambulatory Visit
Admission: RE | Admit: 2014-01-07 | Discharge: 2014-01-07 | Disposition: A | Discharge status: Home / Self Care | Payer: BC Managed Care – PPO | Source: Ambulatory Visit | Attending: Internal Medicine | Admitting: Internal Medicine

## 2014-01-07 DIAGNOSIS — Z1231 Encounter for screening mammogram for malignant neoplasm of breast: Secondary | ICD-10-CM

## 2014-01-07 LAB — HM MAMMOGRAPHY: HM MAMMO: NORMAL

## 2014-01-14 ENCOUNTER — Telehealth: Payer: Self-pay | Admitting: Internal Medicine

## 2014-01-14 DIAGNOSIS — D869 Sarcoidosis, unspecified: Secondary | ICD-10-CM

## 2014-01-14 MED ORDER — AMOXICILLIN-POT CLAVULANATE 875-125 MG PO TABS: 1.0000 | ORAL_TABLET | Freq: Two times a day (BID) | ORAL | Status: DC

## 2014-01-14 NOTE — Telephone Encounter (Signed)
Last OV 12-26-13. I spoke with the pt and she states that over the last 2 days she has been having pain on the right side of her chest that moves around her side to her back. She has also had a low grade temp, sore throat and dry cough. She states that it hurst in the same area when she takes a deep breath. Pt denies any chest congestion, hemoptysis, SOB at this time.Carron CurieJennifer Joseff Luckman, CMA  No Known Allergies  Current Outpatient Prescriptions on File Prior to Visit  Medication Sig Dispense Refill  . albuterol (PROAIR HFA) 108 (90 BASE) MCG/ACT inhaler 2 puffs  Every 4 hours if needed- rescue  1 each  prn  . azithromycin (ZITHROMAX) 250 MG tablet 2 today then one daily  6 tablet  0  . Fe Cbn-Fe Gluc-FA-B12-C-DSS (FERRALET 90) 90-1 MG TABS Take 1 tablet by mouth daily.  90 each  3  . ferrous sulfate 325 (65 FE) MG tablet Take 325 mg by mouth daily with breakfast.       No current facility-administered medications on file prior to visit.

## 2014-01-14 NOTE — Telephone Encounter (Signed)
I called spoke with pt. She reports she will have to call us back will await call

## 2014-01-14 NOTE — Telephone Encounter (Signed)
Pt returning call

## 2014-01-14 NOTE — Telephone Encounter (Signed)
We had better treat this as pneumonia until we know better: Call in Rx augmentin 875 mg, # 14, 1 twice daily- start tonight Advil or etc and heating pad tonight for chest pain as needed Order- outpatient CXR     "R chest pain, sarcoid"             CBC w diff  If she gets worse this evening- go to ER or Urgent Care  Call us tomorrow to let us know how she's doing.

## 2014-01-14 NOTE — Telephone Encounter (Signed)
Pt aware of recs. RX sent to [facility]. Per pt request. Orders placed for CXR and lab. Nothing further needed

## 2014-01-15 ENCOUNTER — Ambulatory Visit (INDEPENDENT_AMBULATORY_CARE_PROVIDER_SITE_OTHER)
Admission: RE | Admit: 2014-01-15 | Discharge: 2014-01-15 | Disposition: A | Discharge status: Home / Self Care | Payer: BC Managed Care – PPO | Source: Ambulatory Visit | Attending: Internal Medicine | Admitting: Internal Medicine

## 2014-01-15 ENCOUNTER — Other Ambulatory Visit (INDEPENDENT_AMBULATORY_CARE_PROVIDER_SITE_OTHER): Payer: BC Managed Care – PPO

## 2014-01-15 DIAGNOSIS — D869 Sarcoidosis, unspecified: Secondary | ICD-10-CM

## 2014-01-15 LAB — CBC WITH DIFFERENTIAL/PLATELET
BASOS ABS: 0 10*3/uL (ref 0.0–0.1)
Basophils Relative: 0.4 % (ref 0.0–3.0)
Eosinophils Absolute: 0.1 10*3/uL (ref 0.0–0.7)
Eosinophils Relative: 1.7 % (ref 0.0–5.0)
HCT: 36.8 % (ref 36.0–46.0)
Hemoglobin: 11.9 g/dL — ABNORMAL LOW (ref 12.0–15.0)
LYMPHS ABS: 1.3 10*3/uL (ref 0.7–4.0)
Lymphocytes Relative: 30.7 % (ref 12.0–46.0)
MCHC: 32.5 g/dL (ref 30.0–36.0)
MCV: 82.8 fl (ref 78.0–100.0)
MONO ABS: 0.3 10*3/uL (ref 0.1–1.0)
MONOS PCT: 8.2 % (ref 3.0–12.0)
Neutro Abs: 2.4 10*3/uL (ref 1.4–7.7)
Neutrophils Relative %: 59 % (ref 43.0–77.0)
PLATELETS: 147 10*3/uL — AB (ref 150.0–400.0)
RBC: 4.44 Mil/uL (ref 3.87–5.11)
RDW: 15.3 % (ref 11.5–15.5)
WBC: 4.1 10*3/uL (ref 4.0–10.5)

## 2014-01-22 ENCOUNTER — Encounter: Payer: Self-pay | Admitting: Internal Medicine

## 2014-01-22 ENCOUNTER — Ambulatory Visit (INDEPENDENT_AMBULATORY_CARE_PROVIDER_SITE_OTHER): Payer: BC Managed Care – PPO | Admitting: Internal Medicine

## 2014-01-22 VITALS — BP 116/72 | HR 65 | Ht 63.0 in | Wt 176.6 lb

## 2014-01-22 DIAGNOSIS — K1370 Unspecified lesions of oral mucosa: Secondary | ICD-10-CM

## 2014-01-22 DIAGNOSIS — D869 Sarcoidosis, unspecified: Secondary | ICD-10-CM

## 2014-01-22 DIAGNOSIS — R042 Hemoptysis: Secondary | ICD-10-CM

## 2014-01-22 NOTE — Progress Notes (Signed)
Patient ID: Nancy Villarreal, female    DOB: 05/23/1971, 43 y.o.   MRN: [id number]  HPI 11/30/10- 43 yoF former smoker followed here with sarcoid, currently maintained on prednisone 5 mg every other day Bronch NEG 09/24/08- dx based on mediastinal adenopathy/ interstitial disease, ACE 90, response to steroids . She was put back on daily prednisone at 10 mg daily in May, 2011 for ACE 111.  Has had hemoptysis in past, allergic rhinitis and asthma. In September, 2011 was doing very well and was changed to 5 mg every other day. Felt well here January, 2012. Has had some hx asthma and allergic rhinitis. CXR 09/07/10 showed stable Stage III intersiittial scarring unchanged back to 09/11/08. In last 2 weeks has noted increased dyspnea with wheeze lying down, night sweats, joint pains, fatigue, hair falling out, frightened that she couldn't breathe, dyspnea walking parking lot. Last night propped up to sleep. Denies chest pain, palpitation, rash or nodes, leg swelling or pain.    02/15/11- Sarcoid Stage III, hx asthma, rhinitis She feels she is doing well, still maintaining on prednisone 5 mg every other day. She is comfortable with that and feels well. Admits occasional night sweat. Denies rash, cough, nodes.  We discussed how and when to wean off steroids.  05/04/11-  43 yoF former smoker followed here with sarcoid,  Bronch NEG 09/24/08- dx based on mediastinal adenopathy/ interstitial disease, ACE 90, response to steroids. She called after coughing up a mouthfull of blood on 1 night a week ago. She kept on coughing, but there was no more blood. She still coughs, back to baseline. Notes temps up to 100 at night with some sweats, no nodes or rash. Aware of some bones aching- especially one area lateral left calf at times- nothing visible. More frequent heart burn. Some DOE. Feeling tired x 1 month- wants to sleep. Sweats and ache in shoulders affect sleep comfort.  We reviewed her ACE levels and prior xray images   and discussed her concerns about prednisone.  06/21/11- 43 yoF former smoker followed here with sarcoid,  Bronch NEG 09/24/08- dx based on mediastinal adenopathy/ interstitial disease, ACE 90, response to steroid. She says she feels fine and has had her flu vaccine. Now exercising and lost 5 pounds. She has continued prednisone 10 mg every other day since last here. We reviewed recent labs including bone density which was within normal, and CT scan noted below. Bone Density- Normal- 04/27/11 IMPRESSION: CT CHEST 05/06/11 Upper lobe predominant fibrosis with calcified mediastinal and  bilateral hilar lymph nodes, compatible with known sarcoidosis.  Scattered mild tree-in-bud nodules in the right lower lobe, likely  related to sarcoid, mild superimposed infectious process not  excluded.  Original Report Authenticated By: Charline BillsSRIYESH KRISHNAN, M.D.   09/22/11-  43 yoF former smoker followed here with sarcoid Stage III,  Bronch NEG 09/24/08- dx based on mediastinal adenopathy/ interstitial disease, ACE 90, response to steroid. Has continued taking prednisone 5 mg every other day. Feels well except for dry skin. Coughs only when she exercises. Denies fever or sweat. PFT: 07/14/2011-normal spirometry flows, slight response to bronchodilator, normal lung volumes, diffusion moderately reduced. FEV1/FVC 0.84, DLCO 55%. We reviewed again the last CT scan and I printed a representative imaged for her to show family.  12/29/11-  43 yoF former smoker followed here with sarcoid Stage III,  Bronch NEG 09/24/08- dx based on mediastinal adenopathy/ interstitial disease, ACE 90, response to steroid. Her dentist noticed hyperpigmentation on the hard palate. She  complains of fatigue despite adequate sleep. "No energy" not really sleepiness. Denies fever, weight loss, adenopathy, pain. Some increased dry cough without wheeze. She has continued prednisone 5 mg daily. Off of her inhaler "lost it". ACE: 3 months 62, 6 months 67,  7 months 92, 8 months 55, 1 year 55.  02/10/12-  43 yoF former smoker followed here with sarcoid Stage III,  Bronch NEG 09/24/08- dx based on mediastinal adenopathy/ interstitial disease, ACE 90, response to steroid. Iron def anemia SOB and wheezing with cough and congestion comes and goes. Labs from 01/04/2012 included iron low/28, transferrin normal /282. No overt blood loss. She is now taking iron and as directed to discuss this with a primary physician. She needs to establish so we will refer her. She no longer feels run down. "Just a little" chest congestion blamed on the weather. Occasional night sweat possibly from hormones. No adenopathy  06/11/12- 41 yoF former smoker followed here with sarcoid Stage III,  Bronch NEG 09/24/08- dx based on mediastinal adenopathy/ interstitial disease, ACE 90, response to steroid. Iron def anemia Has not taken prednisone since June-no flare ups. She has been feeling very well. She realized she had forgotten prednisone and noticed she was doing well so she deliberately dropped off. We reviewed symptoms suggestive of reactivated sarcoid and she denies all.  12/10/12- 43 yoF former smoker followed here with sarcoid Stage III,  Bronch NEG 09/24/08- dx based on mediastinal adenopathy/ interstitial disease, ACE 90, response to steroid. Iron def anemia FOLLOWS ZOX:WRUEAVWFOR:started coughing up blood since last visit; deep cough in chest Hemoptysis for the first time in over a year, 2 or 3 occasions less than 1 teaspoon. Off of prednisone since June of 2013. Increased cough since a cold 2 weeks ago. Some sweats. Feels tired. She is dieting. No adenopathy. Vague aching persistent chest pain sternum through to back at least for several days. Angiotensin-Converting Enzyme 8 - 52 U/L  86 (H) 06/11/12    68 (H)    62 (H)    67 (H)    92 (H)    85 (H)    59 (H) 11/30/10   CXR 06/20/12-IMPRESSION:  1. Nodularity and architectural distortion in the lungs, stable  and compatible  with sarcoidosis.  Original Report Authenticated By: Dellia CloudWALTER D. LIEBKEMANN, M.D.  01/09/13- [age] yoF former smoker followed here with sarcoid Stage III,  Bronch NEG 09/24/08- dx based on mediastinal adenopathy/ interstitial disease, ACE 90, response to steroid. Iron def anemia Chest feels ok. Slight cough, scant clear mucus. Not on prednisone. Still night sweat. No rash, nodes. ACE- 12/10/12- 69    Renal and liver function normal. CT chest 12/13/12-  IMPRESSION:  1. No acute findings.  2. Upper lobe predominant fibrotic changes and perilymphatic  nodularity consistent with sarcoid. The overall extent of  pulmonary sarcoid is not significantly changed from previous exam.  3. Stable calcified and noncalcified mediastinal and hilar lymph  nodes.  Original Report Authenticated By: Signa Kellaylor Stroud, M.D.  10/25/13- 42 yoF former smoker followed here with sarcoid Stage III,  Bronch NEG 09/24/08- dx based on mediastinal adenopathy/ interstitial disease, ACE 90, response to steroid. Iron def anemia FOLLOWS FOR: pt states her breathing has been fine. Has not been using Proair inhaler as she feels she has not needed it.  Has been off of prednisone over a year. Occasional irritant exposure triggers some cough, otherwise none. Denies fever, night sweats, rash or adenopathy.  12/26/13- [age] yoF former smoker followed here with  sarcoid Stage III,  Bronch NEG 09/24/08- dx based on mediastinal adenopathy/ interstitial disease, ACE 90, response to steroid. Iron def anemia Female companion here FOLLOWS FOR: Pt c/o hemopytsis, starting yesterday afternoon pt states it was mild cough with little blood. Pt states last night she had much heavier coughing, while coughing up "about a tsp of blood". Pt c/o chest tightness and pressure with and without activty. Pt constantly feels the urge to cough. Denies SOB, chest pain, palpitation, leg discomfort, other bruising or bleeding. Not on aspirin. CXR 10/29/13 IMPRESSION:  No active  cardiopulmonary disease. Stable chronic reticulonodular  interstitial prominence.  Electronically Signed  By: Natasha MeadLiviu Pop M.D.  On: 10/25/2013 09:38 CXR 12/26/13- today IMPRESSION:  1. Superimposed airspace disease on chronic interstitial coarsening  in the right lower lobe is concerning for bronchopneumonia.  2. Diffuse interstitial disease is compatible with chronic  sarcoidosis.  Electronically Signed  By: Gennette Pachris Mattern M.D.  On: 12/26/2013 14:32  01/22/14- [age] yoF former smoker followed here with sarcoid Stage III,  Bronch NEG 09/24/08- dx based on mediastinal adenopathy/ interstitial disease, ACE 90, response to steroid. Iron def anemia FOLLOWS FOR: 1 month follow up, pt states she is still having fatigue but denies any hemoptysis or cough. Concerned about a spot on the roof of her mouth which she thinks increases when her ACE level is higher ACE 4/23- increased 99 CXR 01/16/14 IMPRESSION:  Stable fibrotic changes in this patient with sarcoidosis. No active  process.  Electronically Signed  By: Dwyane DeePaul Barry M.D.  On: 01/15/2014 09:17  Review of Systems-see HPI    Constitutional:  Per HPI HEENT:   No headaches,  Difficulty swallowing,  Tooth/dental problems,  Sore throat,                No sneezing, itching, ear ache, nasal congestion, post nasal drip,  CV:  No- chest pain,  No-PND, swelling in lower extremities, anasarca, dizziness, palpitations GI  No heartburn, indigestion, abdominal pain, nausea, vomiting,  Resp:  No acute shortness of breath with exertion .  No excess mucus, no productive cough,                         +minor non-productive cough,  No- recent coughing up of blood.  No change in color of mucus.  No wheezing.  Skin: no rash or lesions. GU: MS: joint pain .  No decreased range of motion.  No back pain. Psych:  No change in mood or affect. No depression or anxiety.  No memory loss.  Objective:   Physical Exam General- Alert, Oriented, Affect-appropriate,  Distress- none acute. Looks well.  Skin-  no rash, lesions- none, excoriation- none Lymphadenopathy- none Head- atraumatic            Eyes- Gross vision intact, PERRLA, conjunctivae clear secretions            Ears- Hearing, canals normal            Nose- Clear, No-Septal dev, mucus, polyps, erosion, perforation             Throat- Mallampati II , mucosa clear , drainage- none, tonsils present.                                                              +  Hyperpigmentation on hard palate. Neck- flexible , trachea midline, no stridor , thyroid nl, carotid no bruit Chest - symmetrical excursion , unlabored           Heart/CV- RRR , no murmur , no gallop  , no rub, nl s1 s2                           - JVD- none , edema- none, stasis changes- none, varices- none           Lung-   Cough-none, chest clear, unlabored, wheeze- none,  dullness-none, rub- none           Chest wall-  Abd-  Br/ Gen/ Rectal- Not done, not indicated Extrem- cyanosis- none, clubbing, none, atrophy- none, strength- nl Neuro- grossly intact to observation

## 2014-01-22 NOTE — Patient Instructions (Signed)
[facility]   Hyperpigmented lesion roof of mouth, sarcoid

## 2014-03-09 ENCOUNTER — Encounter: Payer: Self-pay | Admitting: Internal Medicine

## 2014-03-09 NOTE — Assessment & Plan Note (Signed)
Currently in remission. 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

## 2014-03-09 NOTE — Assessment & Plan Note (Signed)
ACE up to 99 as of 12/26/2013 but otherwise no evident activity. Hemoptysis has resolved. Significant for spots on the roof of her mouth is unknown. She would like ENT to look at it Plan ENT referral for a hyperpigmented lesion on roof of mouth

## 2014-06-03 ENCOUNTER — Telehealth: Payer: Self-pay | Admitting: Internal Medicine

## 2014-06-03 NOTE — Telephone Encounter (Signed)
Patient is requesting just a pap but its not time for cpe.  Is this ok to schedule with Yetta BarreJones?

## 2014-06-04 NOTE — Telephone Encounter (Signed)
It would be routine

## 2014-06-13 ENCOUNTER — Ambulatory Visit: Payer: BC Managed Care – PPO | Admitting: Internal Medicine

## 2014-06-16 ENCOUNTER — Other Ambulatory Visit (INDEPENDENT_AMBULATORY_CARE_PROVIDER_SITE_OTHER): Payer: BC Managed Care – PPO

## 2014-06-16 ENCOUNTER — Ambulatory Visit (INDEPENDENT_AMBULATORY_CARE_PROVIDER_SITE_OTHER): Payer: BC Managed Care – PPO | Admitting: Internal Medicine

## 2014-06-16 ENCOUNTER — Other Ambulatory Visit: Payer: Self-pay | Admitting: Internal Medicine

## 2014-06-16 ENCOUNTER — Other Ambulatory Visit (HOSPITAL_COMMUNITY)
Admission: RE | Admit: 2014-06-16 | Discharge: 2014-06-16 | Disposition: A | Discharge status: Home / Self Care | Payer: BC Managed Care – PPO | Source: Ambulatory Visit | Attending: Internal Medicine | Admitting: Internal Medicine

## 2014-06-16 ENCOUNTER — Encounter: Payer: Self-pay | Admitting: Internal Medicine

## 2014-06-16 VITALS — BP 112/70 | HR 77 | Temp 98.7°F | Resp 16 | Ht 63.0 in | Wt 173.0 lb

## 2014-06-16 DIAGNOSIS — Z01419 Encounter for gynecological examination (general) (routine) without abnormal findings: Secondary | ICD-10-CM | POA: Insufficient documentation

## 2014-06-16 DIAGNOSIS — D509 Iron deficiency anemia, unspecified: Secondary | ICD-10-CM

## 2014-06-16 DIAGNOSIS — Z23 Encounter for immunization: Secondary | ICD-10-CM

## 2014-06-16 DIAGNOSIS — J4530 Mild persistent asthma, uncomplicated: Secondary | ICD-10-CM

## 2014-06-16 DIAGNOSIS — Z124 Encounter for screening for malignant neoplasm of cervix: Secondary | ICD-10-CM

## 2014-06-16 DIAGNOSIS — J301 Allergic rhinitis due to pollen: Secondary | ICD-10-CM

## 2014-06-16 DIAGNOSIS — Z0000 Encounter for general adult medical examination without abnormal findings: Secondary | ICD-10-CM

## 2014-06-16 LAB — CBC WITH DIFFERENTIAL/PLATELET
BASOS ABS: 0 10*3/uL (ref 0.0–0.1)
Basophils Relative: 0.4 % (ref 0.0–3.0)
Eosinophils Absolute: 0.1 10*3/uL (ref 0.0–0.7)
Eosinophils Relative: 2.4 % (ref 0.0–5.0)
HCT: 40.1 % (ref 36.0–46.0)
Hemoglobin: 12.8 g/dL (ref 12.0–15.0)
LYMPHS PCT: 39.6 % (ref 12.0–46.0)
Lymphs Abs: 1.4 10*3/uL (ref 0.7–4.0)
MCHC: 31.9 g/dL (ref 30.0–36.0)
MCV: 85.4 fl (ref 78.0–100.0)
Monocytes Absolute: 0.3 10*3/uL (ref 0.1–1.0)
Monocytes Relative: 9.1 % (ref 3.0–12.0)
Neutro Abs: 1.7 10*3/uL (ref 1.4–7.7)
Neutrophils Relative %: 48.5 % (ref 43.0–77.0)
Platelets: 179 10*3/uL (ref 150.0–400.0)
RBC: 4.69 Mil/uL (ref 3.87–5.11)
RDW: 12.8 % (ref 11.5–15.5)
WBC: 3.4 10*3/uL — ABNORMAL LOW (ref 4.0–10.5)

## 2014-06-16 LAB — FERRITIN: Ferritin: 8.9 ng/mL — ABNORMAL LOW (ref 10.0–291.0)

## 2014-06-16 LAB — IBC PANEL
IRON: 52 ug/dL (ref 42–145)
Saturation Ratios: 14.8 % — ABNORMAL LOW (ref 20.0–50.0)
Transferrin: 251.5 mg/dL (ref 212.0–360.0)

## 2014-06-16 MED ORDER — FERROUS SULFATE 325 (65 FE) MG PO TABS: 325.0000 mg | ORAL_TABLET | Freq: Two times a day (BID) | ORAL | Status: DC

## 2014-06-16 NOTE — Progress Notes (Signed)
Pre visit review using our clinic review tool, if applicable. No additional management support is needed unless otherwise documented below in the visit note. 

## 2014-06-16 NOTE — Progress Notes (Signed)
Subjective:    Patient ID: Nancy Villarreal, female    DOB: July 14, 1971, 43 y.o. y.o.   MRN: [id number]  Anemia Presents for follow-up visit. Symptoms include malaise/fatigue. There has been no abdominal pain, anorexia, bruising/bleeding easily, confusion, fever, leg swelling, light-headedness, pallor, palpitations, paresthesias, pica or weight loss. Signs of blood loss that are present include menorrhagia. Signs of blood loss that are not present include hematemesis, hematochezia, melena and vaginal bleeding. Past treatments include oral iron supplements. Compliance problems include psychosocial issues.  Compliance with medications is 0-25%.      Review of Systems  Constitutional: Positive for malaise/fatigue and fatigue. Negative for fever, chills, weight loss, diaphoresis, activity change, appetite change and unexpected weight change.  HENT: Negative.   Eyes: Negative.   Respiratory: Negative.  Negative for cough, choking, chest tightness, shortness of breath and stridor.   Cardiovascular: Negative.  Negative for chest pain, palpitations and leg swelling.  Gastrointestinal: Negative.  Negative for nausea, vomiting, abdominal pain, diarrhea, constipation, blood in stool, melena, hematochezia, anorexia and hematemesis.  Endocrine: Negative.   Genitourinary: Positive for menorrhagia. Negative for hematuria, flank pain and vaginal bleeding.  Musculoskeletal: Negative.  Negative for arthralgias, back pain, joint swelling and myalgias.  Skin: Negative.  Negative for color change, pallor and rash.  Allergic/Immunologic: Negative.   Neurological: Negative.  Negative for dizziness, syncope, speech difficulty, weakness, light-headedness, numbness, headaches and paresthesias.  Hematological: Negative.  Negative for adenopathy. Does not bruise/bleed easily.  Psychiatric/Behavioral: Negative.  Negative for confusion.       Objective:   Physical Exam  Vitals reviewed. Constitutional: She is  oriented to person, place, and time. She appears well-developed and well-nourished. No distress.  HENT:  Head: Normocephalic and atraumatic.  Mouth/Throat: Oropharynx is clear and moist. No oropharyngeal exudate.  Eyes: Conjunctivae are normal. Right eye exhibits no discharge. Left eye exhibits no discharge. No scleral icterus.  Neck: Normal range of motion. Neck supple. No JVD present. No tracheal deviation present. No thyromegaly present.  Cardiovascular: Normal rate, regular rhythm, normal heart sounds and intact distal pulses.  Exam reveals no gallop and no friction rub.   No murmur heard. Pulmonary/Chest: Effort normal and breath sounds normal. No stridor. No respiratory distress. She has no wheezes. She has no rales. She exhibits no tenderness.  Abdominal: Soft. Bowel sounds are normal. She exhibits no distension and no mass. There is no tenderness. There is no rebound and no guarding. Hernia confirmed negative in the right inguinal area and confirmed negative in the left inguinal area.  Genitourinary: Vagina normal. No breast swelling, tenderness, discharge or bleeding. Pelvic exam was performed with patient supine. No labial fusion. There is no rash, tenderness, lesion or injury on the right labia. There is no rash, tenderness, lesion or injury on the left labia. No erythema, tenderness or bleeding around the vagina. No foreign body around the vagina. No signs of injury around the vagina. No vaginal discharge found.  PAP collected and sent to pathology  Musculoskeletal: Normal range of motion. She exhibits no edema and no tenderness.  Lymphadenopathy:    She has no cervical adenopathy.       Right: No inguinal adenopathy present.       Left: No inguinal adenopathy present.  Neurological: She is oriented to person, place, and time.  Skin: Skin is warm and dry. No rash noted. She is not diaphoretic. No erythema. No pallor.  Psychiatric: She has a normal mood and affect. Her behavior is  normal. Judgment and  thought content normal.     Lab Results  Component Value Date   WBC 4.1 01/15/2014   HGB 11.9* 01/15/2014   HCT 36.8 01/15/2014   PLT 147.0* 01/15/2014   GLUCOSE 83 12/11/2013   CHOL 207* 12/11/2013   TRIG 57.0 12/11/2013   HDL 85.60 12/11/2013   LDLCALC 110* 12/11/2013   ALT 12 12/11/2013   AST 19 12/11/2013   NA 136 12/11/2013   K 4.0 12/11/2013   CL 104 12/11/2013   CREATININE 0.7 12/11/2013   BUN 11 12/11/2013   CO2 25 12/11/2013   TSH 1.31 12/11/2013   INR 1.1 RATIO* 09/03/2008       Assessment & Plan:

## 2014-06-16 NOTE — Assessment & Plan Note (Signed)
I have asked her to be more compliant with the iron replacement therapy Will recheck her CBC and iron levels today

## 2014-06-16 NOTE — Patient Instructions (Signed)
Iron Deficiency Anemia Anemia is a condition in which there are less red blood cells or hemoglobin in the blood than normal. Hemoglobin is the part of red blood cells that carries oxygen. Iron deficiency anemia is anemia caused by too little iron. It is the most common type of anemia. It may leave you tired and short of breath. CAUSES   Lack of iron in the diet.  Poor absorption of iron, as seen with intestinal disorders.  Intestinal bleeding.  Heavy periods. SIGNS AND SYMPTOMS  Mild anemia may not be noticeable. Symptoms may include:  Fatigue.  Headache.  Pale skin.  Weakness.  Tiredness.  Shortness of breath.  Dizziness.  Cold hands and feet.  Fast or irregular heartbeat. DIAGNOSIS  Diagnosis requires a thorough evaluation and physical exam by your health care provider. Blood tests are generally used to confirm iron deficiency anemia. Additional tests may be done to find the underlying cause of your anemia. These may include:  Testing for blood in the stool (fecal occult blood test).  A procedure to see inside the colon and rectum (colonoscopy).  A procedure to see inside the esophagus and stomach (endoscopy). TREATMENT  Iron deficiency anemia is treated by correcting the cause of the deficiency. Treatment may involve:  Adding iron-rich foods to your diet.  Taking iron supplements. Pregnant or breastfeeding women need to take extra iron because their normal diet usually does not provide the required amount.  Taking vitamins. Vitamin C improves the absorption of iron. Your health care provider may recommend that you take your iron tablets with a glass of orange juice or vitamin C supplement.  Medicines to make heavy menstrual flow lighter.  Surgery. HOME CARE INSTRUCTIONS   Take iron as directed by your health care provider.  If you cannot tolerate taking iron supplements by mouth, talk to your health care provider about taking them through a vein  (intravenously) or an injection into a muscle.  For the best iron absorption, iron supplements should be taken on an empty stomach. If you cannot tolerate them on an empty stomach, you may need to take them with food.  Do not drink milk or take antacids at the same time as your iron supplements. Milk and antacids may interfere with the absorption of iron.  Iron supplements can cause constipation. Make sure to include fiber in your diet to prevent constipation. A stool softener may also be recommended.  Take vitamins as directed by your health care provider.  Eat a diet rich in iron. Foods high in iron include liver, lean beef, whole-grain bread, eggs, dried fruit, and dark green leafy vegetables. SEEK IMMEDIATE MEDICAL CARE IF:   You faint. If this happens, do not drive. Call your local emergency services (911 in [place] if no other help is available.  You have chest pain.  You feel nauseous or vomit.  You have severe or increased shortness of breath with activity.  You feel weak.  You have a rapid heartbeat.  You have unexplained sweating.  You become light-headed when getting up from a chair or bed. MAKE SURE YOU:   Understand these instructions.  Will watch your condition.  Will get help right away if you are not doing well or get worse. Document Released: 08/19/2000 Document Revised: 08/27/2013 Document Reviewed: 04/29/2013 [employer] Patient Information 2015 [employer]. This information is not intended to replace advice given to you by your health care provider. Make sure you discuss any questions you have with your health care provider.  

## 2014-06-19 ENCOUNTER — Encounter: Payer: Self-pay | Admitting: Internal Medicine

## 2014-06-19 LAB — CYTOLOGY - PAP

## 2014-06-19 LAB — HM PAP SMEAR: HM Pap smear: NORMAL

## 2014-08-17 ENCOUNTER — Ambulatory Visit (INDEPENDENT_AMBULATORY_CARE_PROVIDER_SITE_OTHER): Payer: 59 | Admitting: Family Medicine

## 2014-08-17 ENCOUNTER — Ambulatory Visit (INDEPENDENT_AMBULATORY_CARE_PROVIDER_SITE_OTHER): Payer: 59

## 2014-08-17 VITALS — BP 112/72 | HR 98 | Temp 98.6°F | Resp 18 | Ht 64.0 in | Wt 179.0 lb

## 2014-08-17 DIAGNOSIS — M545 Low back pain, unspecified: Secondary | ICD-10-CM

## 2014-08-17 DIAGNOSIS — K5900 Constipation, unspecified: Secondary | ICD-10-CM

## 2014-08-17 MED ORDER — POLYETHYLENE GLYCOL 3350 17 GM/SCOOP PO POWD: 17.0000 g | Freq: Two times a day (BID) | ORAL | Status: DC | PRN

## 2014-08-17 MED ORDER — DICLOFENAC SODIUM 75 MG PO TBEC: 75.0000 mg | DELAYED_RELEASE_TABLET | Freq: Three times a day (TID) | ORAL | Status: DC

## 2014-08-17 MED ORDER — KETOROLAC TROMETHAMINE 60 MG/2ML IM SOLN
60.0000 mg | Freq: Once | INTRAMUSCULAR | Status: AC
  Administered 2014-08-17: 60 mg via INTRAMUSCULAR

## 2014-08-17 MED ORDER — CYCLOBENZAPRINE HCL 10 MG PO TABS: 10.0000 mg | ORAL_TABLET | Freq: Three times a day (TID) | ORAL | Status: DC | PRN

## 2014-08-17 MED ORDER — HYDROCODONE-ACETAMINOPHEN 5-325 MG PO TABS: 1.0000 | ORAL_TABLET | Freq: Four times a day (QID) | ORAL | Status: DC | PRN

## 2014-08-17 NOTE — Progress Notes (Addendum)
Subjective:  This chart was scribed for Norberto SorensonEva Letizia Hook, MD by Haywood PaoNadim Abu Hashem, ED Scribe at [facility].The patient was seen in exam room 13 and the patient's care was started at 4:08 PM.   Patient ID: Nancy Villarreal, female    DOB: 25-Dec-1970, 43 y.o.   MRN: [id number] Chief Complaint  Patient presents with  . Back Spasms   HPI HPI Comments: Nancy Villarreal is a 43 y.o. female who [facility] complaining of middle back spasms,  sudden onset 3 days ago. Pt states the pain radiates to her abdomen but not to her lower extremities. Pt states she usually gets a dullness in her back during her menstrual period after her period the pain did not go away. She denies numbness ,weakness in her legs, fever, and chills. 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 Her bladder and BM are normal. Pt has taken tylenol and a patch on her back for a minor relief. Pt takes a multivitamins, pt quit smoking in 2009.  Past Medical History  Diagnosis Date  . Abnormal chest x-ray   . Hemoptysis   . Unspecified asthma(493.90)   . Sarcoid     bronch negative 09-2008 ACE 93  . Anemia    Current Outpatient Prescriptions on File Prior to Visit  Medication Sig Dispense Refill  . ferrous sulfate 325 (65 FE) MG tablet Take 1 tablet (325 mg total) by mouth 2 (two) times daily with a meal. 180 tablet 3   No current facility-administered medications on file prior to visit.   No Known Allergies  Review of Systems  Constitutional: Negative for fever and chills.  Musculoskeletal: Positive for back pain.  Neurological: Negative for weakness and numbness.      Objective:  BP 112/72 mmHg  Pulse 98  Temp(Src) 98.6 F (37 C) (Oral)  Resp 18  Ht 5\' 4"  (1.626 m)  Wt 179 lb (81.194 kg)  BMI 30.71 kg/m2  SpO2 98%  LMP 08/11/2014  Physical Exam  Constitutional: She is oriented to person, place, and time. She appears well-developed and well-nourished. No distress.  HENT:  Head: Normocephalic and atraumatic.   Eyes: EOM are normal.  Neck: Normal range of motion.  Cardiovascular: Normal rate.   Pulmonary/Chest: Effort normal.  Abdominal: Soft. Bowel sounds are normal. She exhibits no distension and no mass. There is no hepatosplenomegaly. There is no tenderness.  Musculoskeletal:  Tenderness over the central lumbar spine. No paraspinal muscle tenderness. Normal flexionand lateral flexion. Mildly decreased extension and lateral rotation. Unable to illicit DTRs bilaterally.   Neurological: She is alert and oriented to person, place, and time.  Skin: Skin is warm and dry.  Psychiatric: She has a normal mood and affect. Her behavior is normal.  Nursing note and vitals reviewed.    [facility] reading (PRIMARY) by  Dr. Clelia CroftShaw. Lumbar spine xray: no acute abnormality, moderate stool burden.   Dg Lumbar Spine 2-3 Views  08/19/2014   CLINICAL DATA:  Five-day knees of nonspecific lower back pain without known injury  EXAM: LUMBAR SPINE - 2-3 VIEW  COMPARISON:  None.  FINDINGS: The lumbar vertebral bodies are preserved in height. There is subtle curvature of the mid lumbar spine with the convexity towards the left. This may reflect muscle spasm. The pedicles and transverse processes are intact where visualized. There is a spina bifida occulta at S1. The disc space heights are well maintained. There is minimal superior endplate spurring at L4 bilaterally. There is no spondylolisthesis.  There is mild facet joint hypertrophy at L5-S1 bilaterally.  IMPRESSION: There is no acute bony abnormality of the lumbar spine. Minimal degenerative change is present manifested by facet joint hypertrophy at L5-S1 and small superior endplate osteophytes bilaterally at L4.   Electronically Signed   By: David  [place]   On: 08/19/2014 10:27    Assessment & Plan:   Midline low back pain without sciatica - Plan: DG Lumbar Spine 2-3 Views, ketorolac (TORADOL) injection 60 mg  Likely secondary to arthritis - start nsaid. Rec heat and  gentle stretching. RTC if pan cont after 1 mo or worsening.  Constipation - likely making sxs worse - start bid miralax and can add in senokot S prn  Meds ordered this encounter  Medications  . ketorolac (TORADOL) injection 60 mg    Sig:   . cyclobenzaprine (FLEXERIL) 10 MG tablet    Sig: Take 1 tablet (10 mg total) by mouth 3 (three) times daily as needed for muscle spasms.    Dispense:  40 tablet    Refill:  0  . HYDROcodone-acetaminophen (NORCO/VICODIN) 5-325 MG per tablet    Sig: Take 1 tablet by mouth every 6 (six) hours as needed for moderate pain.    Dispense:  20 tablet    Refill:  0  . diclofenac (VOLTAREN) 75 MG EC tablet    Sig: Take 1 tablet (75 mg total) by mouth 3 (three) times daily.    Dispense:  30 tablet    Refill:  1  . polyethylene glycol powder (GLYCOLAX/MIRALAX) powder    Sig: Take 17 g by mouth 2 (two) times daily as needed.    Dispense:  255 g    Refill:  1    I personally performed the services described in this documentation, which was scribed in my presence. The recorded information has been reviewed and considered, and addended by me as needed.  Norberto SorensonEva Omega Slager, MD MPH

## 2014-08-17 NOTE — Patient Instructions (Addendum)
Start twice a day miralax. In addition to make sure your constipation is fully treated you can augment with colace or magnesium citrate which are available over the counter.  I recommend doing a miralax clean-out. Put 14 (not kidding) doses of miralax (polyethylene glycol) into 64 oz of any clear, non-carbonated liquid (apple juice, gatorade, water) and drink this WITHIN 24 HOURS!!!! For the next 2d, plan to stay home and just hang around the toilet as you will hopefully be having 8 BM/day of LIQUID stool.  If the diarrhea occurs soon after starting process without passing a significant stool volume or if you are having any fecal incontinence you should keep going as this may be the initial miralax washing around the larger stools.  Constipation Constipation is when a person has fewer than three bowel movements a week, has difficulty having a bowel movement, or has stools that are dry, hard, or larger than normal. As people grow older, constipation is more common. If you try to fix constipation with medicines that make you have a bowel movement (laxatives), the problem may get worse. Long-term laxative use may cause the muscles of the colon to become weak. A low-fiber diet, not taking in enough fluids, and taking certain medicines may make constipation worse.  CAUSES   Certain medicines, such as antidepressants, pain medicine, iron supplements, antacids, and water pills.   Certain diseases, such as diabetes, irritable bowel syndrome (IBS), thyroid disease, or depression.   Not drinking enough water.   Not eating enough fiber-rich foods.   Stress or travel.   Lack of physical activity or exercise.   Ignoring the urge to have a bowel movement.   Using laxatives too much.  SIGNS AND SYMPTOMS   Having fewer than three bowel movements a week.   Straining to have a bowel movement.   Having stools that are hard, dry, or larger than normal.   Feeling full or bloated.   Pain in  the lower abdomen.   Not feeling relief after having a bowel movement.  DIAGNOSIS  Your health care provider will take a medical history and perform a physical exam. Further testing may be done for severe constipation. Some tests may include:  A barium enema X-ray to examine your rectum, colon, and, sometimes, your small intestine.   A sigmoidoscopy to examine your lower colon.   A colonoscopy to examine your entire colon. TREATMENT  Treatment will depend on the severity of your constipation and what is causing it. Some dietary treatments include drinking more fluids and eating more fiber-rich foods. Lifestyle treatments may include regular exercise. If these diet and lifestyle recommendations do not help, your health care provider may recommend taking over-the-counter laxative medicines to help you have bowel movements. Prescription medicines may be prescribed if over-the-counter medicines do not work.  HOME CARE INSTRUCTIONS   Eat foods that have a lot of fiber, such as fruits, vegetables, whole grains, and beans.  Limit foods high in fat and processed sugars, such as french fries, hamburgers, cookies, candies, and soda.   A fiber supplement may be added to your diet if you cannot get enough fiber from foods.   Drink enough fluids to keep your urine clear or pale yellow.   Exercise regularly or as directed by your health care provider.   Go to the restroom when you have the urge to go. Do not hold it.   Only take over-the-counter or prescription medicines as directed by your health care provider. Do not take  other medicines for constipation without talking to your health care provider first.  SEEK IMMEDIATE MEDICAL CARE IF:   You have bright red blood in your stool.   Your constipation lasts for more than 4 days or gets worse.   You have abdominal or rectal pain.   You have thin, pencil-like stools.   You have unexplained weight loss. MAKE SURE YOU:    Understand these instructions.  Will watch your condition.  Will get help right away if you are not doing well or get worse. Document Released: 05/20/2004 Document Revised: 08/27/2013 Document Reviewed: 06/03/2013 [facility] Patient Information 2015 [place], [place]. This information is not intended to replace advice given to you by your health care provider. Make sure you discuss any questions you have with your health care provider.    Low Back Strain with Rehab A strain is an injury in which a tendon or muscle is torn. The muscles and tendons of the lower back are vulnerable to strains. However, these muscles and tendons are very strong and require a great force to be injured. Strains are classified into three categories. Grade 1 strains cause pain, but the tendon is not lengthened. Grade 2 strains include a lengthened ligament, due to the ligament being stretched or partially ruptured. With grade 2 strains there is still function, although the function may be decreased. Grade 3 strains involve a complete tear of the tendon or muscle, and function is usually impaired. SYMPTOMS   Pain in the lower back.  Pain that affects one side more than the other.  Pain that gets worse with movement and may be felt in the hip, buttocks, or back of the thigh.  Muscle spasms of the muscles in the back.  Swelling along the muscles of the back.  Loss of strength of the back muscles.  Crackling sound (crepitation) when the muscles are touched. CAUSES  Lower back strains occur when a force is placed on the muscles or tendons that is greater than they can handle. Common causes of injury include:  Prolonged overuse of the muscle-tendon units in the lower back, usually from incorrect posture.  A single violent injury or force applied to the back. RISK INCREASES WITH:  Sports that involve twisting forces on the spine or a lot of bending at the waist (football, rugby, weightlifting, bowling, golf,  tennis, speed skating, racquetball, swimming, running, gymnastics, diving).  Poor strength and flexibility.  Failure to warm up properly before activity.  Family history of lower back pain or disk disorders.  Previous back injury or surgery (especially fusion).  Poor posture with lifting, especially heavy objects.  Prolonged sitting, especially with poor posture. PREVENTION   Learn and use proper posture when sitting or lifting (maintain proper posture when sitting, lift using the knees and legs, not at the waist).  Warm up and stretch properly before activity.  Allow for adequate recovery between workouts.  Maintain physical fitness:  Strength, flexibility, and endurance.  Cardiovascular fitness. PROGNOSIS  If treated properly, lower back strains usually heal within 6 weeks. RELATED COMPLICATIONS   Recurring symptoms, resulting in a chronic problem.  Chronic inflammation, scarring, and partial muscle-tendon tear.  Delayed healing or resolution of symptoms.  Prolonged disability. TREATMENT  Treatment first involves the use of ice and medicine, to reduce pain and inflammation. The use of strengthening and stretching exercises may help reduce pain with activity. These exercises may be performed at home or with a therapist. Severe injuries may require referral to a therapist for  further evaluation and treatment, such as ultrasound. Your caregiver may advise that you wear a back brace or corset, to help reduce pain and discomfort. Often, prolonged bed rest results in greater harm then benefit. Corticosteroid injections may be recommended. However, these should be reserved for the most serious cases. It is important to avoid using your back when lifting objects. At night, sleep on your back on a firm mattress with a pillow placed under your knees. If non-surgical treatment is unsuccessful, surgery may be needed.  MEDICATION   If pain medicine is needed, nonsteroidal  anti-inflammatory medicines (aspirin and ibuprofen), or other minor pain relievers (acetaminophen), are often advised.  Do not take pain medicine for 7 days before surgery.  Prescription pain relievers may be given, if your caregiver thinks they are needed. Use only as directed and only as much as you need.  Ointments applied to the skin may be helpful.  Corticosteroid injections may be given by your caregiver. These injections should be reserved for the most serious cases, because they may only be given a certain number of times. HEAT AND COLD  Cold treatment (icing) should be applied for 10 to 15 minutes every 2 to 3 hours for inflammation and pain, and immediately after activity that aggravates your symptoms. Use ice packs or an ice massage.  Heat treatment may be used before performing stretching and strengthening activities prescribed by your caregiver, physical therapist, or athletic trainer. Use a heat pack or a warm water soak. SEEK MEDICAL CARE IF:   Symptoms get worse or do not improve in 2 to 4 weeks, despite treatment.  You develop numbness, weakness, or loss of bowel or bladder function.  New, unexplained symptoms develop. (Drugs used in treatment may produce side effects.) EXERCISES  RANGE OF MOTION (ROM) AND STRETCHING EXERCISES - Low Back Strain Most people with lower back pain will find that their symptoms get worse with excessive bending forward (flexion) or arching at the lower back (extension). The exercises which will help resolve your symptoms will focus on the opposite motion.  Your physician, physical therapist or athletic trainer will help you determine which exercises will be most helpful to resolve your lower back pain. Do not complete any exercises without first consulting with your caregiver. Discontinue any exercises which make your symptoms worse until you speak to your caregiver.  If you have pain, numbness or tingling which travels down into your buttocks,  leg or foot, the goal of the therapy is for these symptoms to move closer to your back and eventually resolve. Sometimes, these leg symptoms will get better, but your lower back pain may worsen. This is typically an indication of progress in your rehabilitation. Be very alert to any changes in your symptoms and the activities in which you participated in the 24 hours prior to the change. Sharing this information with your caregiver will allow him/her to most efficiently treat your condition.  These exercises may help you when beginning to rehabilitate your injury. Your symptoms may resolve with or without further involvement from your physician, physical therapist or athletic trainer. While completing these exercises, remember:  Restoring tissue flexibility helps normal motion to return to the joints. This allows healthier, less painful movement and activity.  An effective stretch should be held for at least 30 seconds.  A stretch should never be painful. You should only feel a gentle lengthening or release in the stretched tissue. FLEXION RANGE OF MOTION AND STRETCHING EXERCISES: STRETCH - Flexion, Single Knee to  Chest   Lie on a firm bed or floor with both legs extended in front of you.  Keeping one leg in contact with the floor, bring your opposite knee to your chest. Hold your leg in place by either grabbing behind your thigh or at your knee.  Pull until you feel a gentle stretch in your lower back. Hold __________ seconds.  Slowly release your grasp and repeat the exercise with the opposite side. Repeat __________ times. Complete this exercise __________ times per day.  STRETCH - Flexion, Double Knee to Chest   Lie on a firm bed or floor with both legs extended in front of you.  Keeping one leg in contact with the floor, bring your opposite knee to your chest.  Tense your stomach muscles to support your back and then lift your other knee to your chest. Hold your legs in place by either  grabbing behind your thighs or at your knees.  Pull both knees toward your chest until you feel a gentle stretch in your lower back. Hold __________ seconds.  Tense your stomach muscles and slowly return one leg at a time to the floor. Repeat __________ times. Complete this exercise __________ times per day.  STRETCH - Low Trunk Rotation  Lie on a firm bed or floor. Keeping your legs in front of you, bend your knees so they are both pointed toward the ceiling and your feet are flat on the floor.  Extend your arms out to the side. This will stabilize your upper body by keeping your shoulders in contact with the floor.  Gently and slowly drop both knees together to one side until you feel a gentle stretch in your lower back. Hold for __________ seconds.  Tense your stomach muscles to support your lower back as you bring your knees back to the starting position. Repeat the exercise to the other side. Repeat __________ times. Complete this exercise __________ times per day  EXTENSION RANGE OF MOTION AND FLEXIBILITY EXERCISES: STRETCH - Extension, Prone on Elbows   Lie on your stomach on the floor, a bed will be too soft. Place your palms about shoulder width apart and at the height of your head.  Place your elbows under your shoulders. If this is too painful, stack pillows under your chest.  Allow your body to relax so that your hips drop lower and make contact more completely with the floor.  Hold this position for __________ seconds.  Slowly return to lying flat on the floor. Repeat __________ times. Complete this exercise __________ times per day.  RANGE OF MOTION - Extension, Prone Press Ups  Lie on your stomach on the floor, a bed will be too soft. Place your palms about shoulder width apart and at the height of your head.  Keeping your back as relaxed as possible, slowly straighten your elbows while keeping your hips on the floor. You may adjust the placement of your hands to  maximize your comfort. As you gain motion, your hands will come more underneath your shoulders.  Hold this position __________ seconds.  Slowly return to lying flat on the floor. Repeat __________ times. Complete this exercise __________ times per day.  RANGE OF MOTION- Quadruped, Neutral Spine   Assume a hands and knees position on a firm surface. Keep your hands under your shoulders and your knees under your hips. You may place padding under your knees for comfort.  Drop your head and point your tail bone toward the ground below you. This  will round out your lower back like an angry cat. Hold this position for __________ seconds.  Slowly lift your head and release your tail bone so that your back sags into a large arch, like an old horse.  Hold this position for __________ seconds.  Repeat this until you feel limber in your lower back.  Now, find your "sweet spot." This will be the most comfortable position somewhere between the two previous positions. This is your neutral spine. Once you have found this position, tense your stomach muscles to support your lower back.  Hold this position for __________ seconds. Repeat __________ times. Complete this exercise __________ times per day.  STRENGTHENING EXERCISES - Low Back Strain These exercises may help you when beginning to rehabilitate your injury. These exercises should be done near your "sweet spot." This is the neutral, low-back arch, somewhere between fully rounded and fully arched, that is your least painful position. When performed in this safe range of motion, these exercises can be used for people who have either a flexion or extension based injury. These exercises may resolve your symptoms with or without further involvement from your physician, physical therapist or athletic trainer. While completing these exercises, remember:   Muscles can gain both the endurance and the strength needed for everyday activities through controlled  exercises.  Complete these exercises as instructed by your physician, physical therapist or athletic trainer. Increase the resistance and repetitions only as guided.  You may experience muscle soreness or fatigue, but the pain or discomfort you are trying to eliminate should never worsen during these exercises. If this pain does worsen, stop and make certain you are following the directions exactly. If the pain is still present after adjustments, discontinue the exercise until you can discuss the trouble with your caregiver. STRENGTHENING - Deep Abdominals, Pelvic Tilt  Lie on a firm bed or floor. Keeping your legs in front of you, bend your knees so they are both pointed toward the ceiling and your feet are flat on the floor.  Tense your lower abdominal muscles to press your lower back into the floor. This motion will rotate your pelvis so that your tail bone is scooping upwards rather than pointing at your feet or into the floor.  With a gentle tension and even breathing, hold this position for __________ seconds. Repeat __________ times. Complete this exercise __________ times per day.  STRENGTHENING - Abdominals, Crunches   Lie on a firm bed or floor. Keeping your legs in front of you, bend your knees so they are both pointed toward the ceiling and your feet are flat on the floor. Cross your arms over your chest.  Slightly tip your chin down without bending your neck.  Tense your abdominals and slowly lift your trunk high enough to just clear your shoulder blades. Lifting higher can put excessive stress on the lower back and does not further strengthen your abdominal muscles.  Control your return to the starting position. Repeat __________ times. Complete this exercise __________ times per day.  STRENGTHENING - Quadruped, Opposite UE/LE Lift   Assume a hands and knees position on a firm surface. Keep your hands under your shoulders and your knees under your hips. You may place padding  under your knees for comfort.  Find your neutral spine and gently tense your abdominal muscles so that you can maintain this position. Your shoulders and hips should form a rectangle that is parallel with the floor and is not twisted.  Keeping your trunk steady,  lift your right hand no higher than your shoulder and then your left leg no higher than your hip. Make sure you are not holding your breath. Hold this position __________ seconds.  Continuing to keep your abdominal muscles tense and your back steady, slowly return to your starting position. Repeat with the opposite arm and leg. Repeat __________ times. Complete this exercise __________ times per day.  STRENGTHENING - Lower Abdominals, Double Knee Lift  Lie on a firm bed or floor. Keeping your legs in front of you, bend your knees so they are both pointed toward the ceiling and your feet are flat on the floor.  Tense your abdominal muscles to brace your lower back and slowly lift both of your knees until they come over your hips. Be certain not to hold your breath.  Hold __________ seconds. Using your abdominal muscles, return to the starting position in a slow and controlled manner. Repeat __________ times. Complete this exercise __________ times per day.  POSTURE AND BODY MECHANICS CONSIDERATIONS - Low Back Strain Keeping correct posture when sitting, standing or completing your activities will reduce the stress put on different body tissues, allowing injured tissues a chance to heal and limiting painful experiences. The following are general guidelines for improved posture. Your physician or physical therapist will provide you with any instructions specific to your needs. While reading these guidelines, remember:  The exercises prescribed by your provider will help you have the flexibility and strength to maintain correct postures.  The correct posture provides the best environment for your joints to work. All of your joints have less  wear and tear when properly supported by a spine with good posture. This means you will experience a healthier, less painful body.  Correct posture must be practiced with all of your activities, especially prolonged sitting and standing. Correct posture is as important when doing repetitive low-stress activities (typing) as it is when doing a single heavy-load activity (lifting). RESTING POSITIONS Consider which positions are most painful for you when choosing a resting position. If you have pain with flexion-based activities (sitting, bending, stooping, squatting), choose a position that allows you to rest in a less flexed posture. You would want to avoid curling into a fetal position on your side. If your pain worsens with extension-based activities (prolonged standing, working overhead), avoid resting in an extended position such as sleeping on your stomach. Most people will find more comfort when they rest with their spine in a more neutral position, neither too rounded nor too arched. Lying on a non-sagging bed on your side with a pillow between your knees, or on your back with a pillow under your knees will often provide some relief. Keep in mind, being in any one position for a prolonged period of time, no matter how correct your posture, can still lead to stiffness. PROPER SITTING POSTURE In order to minimize stress and discomfort on your spine, you must sit with correct posture. Sitting with good posture should be effortless for a healthy body. Returning to good posture is a gradual process. Many people can work toward this most comfortably by using various supports until they have the flexibility and strength to maintain this posture on their own. When sitting with proper posture, your ears will fall over your shoulders and your shoulders will fall over your hips. You should use the back of the chair to support your upper back. Your lower back will be in a neutral position, just slightly arched. You  may place a  small pillow or folded towel at the base of your lower back for support.  When working at a desk, create an environment that supports good, upright posture. Without extra support, muscles tire, which leads to excessive strain on joints and other tissues. Keep these recommendations in mind: CHAIR:  A chair should be able to slide under your desk when your back makes contact with the back of the chair. This allows you to work closely.  The chair's height should allow your eyes to be level with the upper part of your monitor and your hands to be slightly lower than your elbows. BODY POSITION  Your feet should make contact with the floor. If this is not possible, use a foot rest.  Keep your ears over your shoulders. This will reduce stress on your neck and lower back. INCORRECT SITTING POSTURES  If you are feeling tired and unable to assume a healthy sitting posture, do not slouch or slump. This puts excessive strain on your back tissues, causing more damage and pain. Healthier options include:  Using more support, like a lumbar pillow.  Switching tasks to something that requires you to be upright or walking.  Talking a brief walk.  Lying down to rest in a neutral-spine position. PROLONGED STANDING WHILE SLIGHTLY LEANING FORWARD  When completing a task that requires you to lean forward while standing in one place for a long time, place either foot up on a stationary 2-4 inch high object to help maintain the best posture. When both feet are on the ground, the lower back tends to lose its slight inward curve. If this curve flattens (or becomes too large), then the back and your other joints will experience too much stress, tire more quickly, and can cause pain. CORRECT STANDING POSTURES Proper standing posture should be assumed with all daily activities, even if they only take a few moments, like when brushing your teeth. As in sitting, your ears should fall over your shoulders and  your shoulders should fall over your hips. You should keep a slight tension in your abdominal muscles to brace your spine. Your tailbone should point down to the ground, not behind your body, resulting in an over-extended swayback posture.  INCORRECT STANDING POSTURES  Common incorrect standing postures include a forward head, locked knees and/or an excessive swayback. WALKING Walk with an upright posture. Your ears, shoulders and hips should all line-up. PROLONGED ACTIVITY IN A FLEXED POSITION When completing a task that requires you to bend forward at your waist or lean over a low surface, try to find a way to stabilize 3 out of 4 of your limbs. You can place a hand or elbow on your thigh or rest a knee on the surface you are reaching across. This will provide you more stability so that your muscles do not fatigue as quickly. By keeping your knees relaxed, or slightly bent, you will also reduce stress across your lower back. CORRECT LIFTING TECHNIQUES DO :   Assume a wide stance. This will provide you more stability and the opportunity to get as close as possible to the object which you are lifting.  Tense your abdominals to brace your spine. Bend at the knees and hips. Keeping your back locked in a neutral-spine position, lift using your leg muscles. Lift with your legs, keeping your back straight.  Test the weight of unknown objects before attempting to lift them.  Try to keep your elbows locked down at your sides in order get the best  strength from your shoulders when carrying an object.  Always ask for help when lifting heavy or awkward objects. INCORRECT LIFTING TECHNIQUES DO NOT:   Lock your knees when lifting, even if it is a small object.  Bend and twist. Pivot at your feet or move your feet when needing to change directions.  Assume that you can safely pick up even a paper clip without proper posture. Document Released: 08/22/2005 Document Revised: 11/14/2011 Document Reviewed:  12/04/2008 [facility] Patient Information 2015 [place], [place]. This information is not intended to replace advice given to you by your health care provider. Make sure you discuss any questions you have with your health care provider.

## 2014-08-22 ENCOUNTER — Encounter: Payer: Self-pay | Admitting: Internal Medicine

## 2014-08-22 ENCOUNTER — Other Ambulatory Visit: Payer: Self-pay | Admitting: Internal Medicine

## 2014-08-22 MED ORDER — AZITHROMYCIN 500 MG PO TABS: 500.0000 mg | ORAL_TABLET | Freq: Every day | ORAL | Status: DC

## 2014-10-28 ENCOUNTER — Encounter: Payer: Self-pay | Admitting: Internal Medicine

## 2014-10-28 ENCOUNTER — Ambulatory Visit (INDEPENDENT_AMBULATORY_CARE_PROVIDER_SITE_OTHER): Payer: 59 | Admitting: Internal Medicine

## 2014-10-28 VITALS — BP 96/66 | HR 86 | Ht 63.0 in | Wt 178.0 lb

## 2014-10-28 DIAGNOSIS — D869 Sarcoidosis, unspecified: Secondary | ICD-10-CM

## 2014-10-28 NOTE — Assessment & Plan Note (Signed)
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 Reactivation is unlikely. Following the ACE in this setting not usually helpful. I have offered to see her back as needed.

## 2014-10-28 NOTE — Patient Instructions (Signed)
Your sarcoid appears to be "burned out" clinically. If you have concerns, we will be happy to see you back.

## 2014-10-28 NOTE — Progress Notes (Signed)
Patient ID: Nancy Villarreal, female    DOB: 05/23/1971, 44 y.o.   MRN: [id number]  HPI 11/30/10- [age] yoF former smoker followed here with sarcoid, currently maintained on prednisone 5 mg every other day Bronch NEG 09/24/08- dx based on mediastinal adenopathy/ interstitial disease, ACE 90, response to steroids . She was put back on daily prednisone at 10 mg daily in May, 2011 for ACE 111.  Has had hemoptysis in past, allergic rhinitis and asthma. In September, 2011 was doing very well and was changed to 5 mg every other day. Felt well here January, 2012. Has had some hx asthma and allergic rhinitis. CXR 09/07/10 showed stable Stage III intersiittial scarring unchanged back to 09/11/08. In last 2 weeks has noted increased dyspnea with wheeze lying down, night sweats, joint pains, fatigue, hair falling out, frightened that she couldn't breathe, dyspnea walking parking lot. Last night propped up to sleep. Denies chest pain, palpitation, rash or nodes, leg swelling or pain.    02/15/11- Sarcoid Stage III, hx asthma, rhinitis She feels she is doing well, still maintaining on prednisone 5 mg every other day. She is comfortable with that and feels well. Admits occasional night sweat. Denies rash, cough, nodes.  We discussed how and when to wean off steroids.  05/04/11-  [age] yoF former smoker followed here with sarcoid,  Bronch NEG 09/24/08- dx based on mediastinal adenopathy/ interstitial disease, ACE 90, response to steroids. She called after coughing up a mouthfull of blood on 1 night a week ago. She kept on coughing, but there was no more blood. She still coughs, back to baseline. Notes temps up to 100 at night with some sweats, no nodes or rash. Aware of some bones aching- especially one area lateral left calf at times- nothing visible. More frequent heart burn. Some DOE. Feeling tired x 1 month- wants to sleep. Sweats and ache in shoulders affect sleep comfort.  We reviewed her ACE levels and prior xray images   and discussed her concerns about prednisone.  06/21/11- [age] yoF former smoker followed here with sarcoid,  Bronch NEG 09/24/08- dx based on mediastinal adenopathy/ interstitial disease, ACE 90, response to steroid. She says she feels fine and has had her flu vaccine. Now exercising and lost 5 pounds. She has continued prednisone 10 mg every other day since last here. We reviewed recent labs including bone density which was within normal, and CT scan noted below. Bone Density- Normal- 04/27/11 IMPRESSION: CT CHEST 05/06/11 Upper lobe predominant fibrosis with calcified mediastinal and  bilateral hilar lymph nodes, compatible with known sarcoidosis.  Scattered mild tree-in-bud nodules in the right lower lobe, likely  related to sarcoid, mild superimposed infectious process not  excluded.  Original Report Authenticated By: Charline BillsSRIYESH KRISHNAN, M.D.   09/22/11-  [age] yoF former smoker followed here with sarcoid Stage III,  Bronch NEG 09/24/08- dx based on mediastinal adenopathy/ interstitial disease, ACE 90, response to steroid. Has continued taking prednisone 5 mg every other day. Feels well except for dry skin. Coughs only when she exercises. Denies fever or sweat. PFT: 07/14/2011-normal spirometry flows, slight response to bronchodilator, normal lung volumes, diffusion moderately reduced. FEV1/FVC 0.84, DLCO 55%. We reviewed again the last CT scan and I printed a representative imaged for her to show family.  12/29/11-  [age] yoF former smoker followed here with sarcoid Stage III,  Bronch NEG 09/24/08- dx based on mediastinal adenopathy/ interstitial disease, ACE 90, response to steroid. Her dentist noticed hyperpigmentation on the hard palate. She  complains of fatigue despite adequate sleep. "No energy" not really sleepiness. Denies fever, weight loss, adenopathy, pain. Some increased dry cough without wheeze. She has continued prednisone 5 mg daily. Off of her inhaler "lost it". ACE: 3 months 62, 6 months 67,  7 months 92, 8 months 55, 1 year 55.  02/10/12-  40 yoF former smoker followed here with sarcoid Stage III,  Bronch NEG 09/24/08- dx based on mediastinal adenopathy/ interstitial disease, ACE 90, response to steroid. Iron def anemia SOB and wheezing with cough and congestion comes and goes. Labs from 01/04/2012 included iron low/28, transferrin normal /282. No overt blood loss. She is now taking iron and as directed to discuss this with a primary physician. She needs to establish so we will refer her. She no longer feels run down. "Just a little" chest congestion blamed on the weather. Occasional night sweat possibly from hormones. No adenopathy  06/11/12- 41 yoF former smoker followed here with sarcoid Stage III,  Bronch NEG 09/24/08- dx based on mediastinal adenopathy/ interstitial disease, ACE 90, response to steroid. Iron def anemia Has not taken prednisone since June-no flare ups. She has been feeling very well. She realized she had forgotten prednisone and noticed she was doing well so she deliberately dropped off. We reviewed symptoms suggestive of reactivated sarcoid and she denies all.  12/10/12- [age] yoF former smoker followed here with sarcoid Stage III,  Bronch NEG 09/24/08- dx based on mediastinal adenopathy/ interstitial disease, ACE 90, response to steroid. Iron def anemia FOLLOWS ZOX:WRUEAVWFOR:started coughing up blood since last visit; deep cough in chest Hemoptysis for the first time in over a year, 2 or 3 occasions less than 1 teaspoon. Off of prednisone since June of 2013. Increased cough since a cold 2 weeks ago. Some sweats. Feels tired. She is dieting. No adenopathy. Vague aching persistent chest pain sternum through to back at least for several days. Angiotensin-Converting Enzyme 8 - 52 U/L  86 (H) 06/11/12    68 (H)    62 (H)    67 (H)    92 (H)    85 (H)    59 (H) 11/30/10   CXR 06/20/12-IMPRESSION:  1. Nodularity and architectural distortion in the lungs, stable  and compatible  with sarcoidosis.  Original Report Authenticated By: Dellia CloudWALTER D. LIEBKEMANN, M.D.  01/09/13- [age] yoF former smoker followed here with sarcoid Stage III,  Bronch NEG 09/24/08- dx based on mediastinal adenopathy/ interstitial disease, ACE 90, response to steroid. Iron def anemia Chest feels ok. Slight cough, scant clear mucus. Not on prednisone. Still night sweat. No rash, nodes. ACE- 12/10/12- 69    Renal and liver function normal. CT chest 12/13/12-  IMPRESSION:  1. No acute findings.  2. Upper lobe predominant fibrotic changes and perilymphatic  nodularity consistent with sarcoid. The overall extent of  pulmonary sarcoid is not significantly changed from previous exam.  3. Stable calcified and noncalcified mediastinal and hilar lymph  nodes.  Original Report Authenticated By: Signa Kellaylor Stroud, M.D.  10/25/13- 42 yoF former smoker followed here with sarcoid Stage III,  Bronch NEG 09/24/08- dx based on mediastinal adenopathy/ interstitial disease, ACE 90, response to steroid. Iron def anemia FOLLOWS FOR: pt states her breathing has been fine. Has not been using Proair inhaler as she feels she has not needed it.  Has been off of prednisone over a year. Occasional irritant exposure triggers some cough, otherwise none. Denies fever, night sweats, rash or adenopathy.  12/26/13- [age] yoF former smoker followed here with  sarcoid Stage III,  Bronch NEG 09/24/08- dx based on mediastinal adenopathy/ interstitial disease, ACE 90, response to steroid. Iron def anemia Female companion here FOLLOWS FOR: Pt c/o hemopytsis, starting yesterday afternoon pt states it was mild cough with little blood. Pt states last night she had much heavier coughing, while coughing up "about a tsp of blood". Pt c/o chest tightness and pressure with and without activty. Pt constantly feels the urge to cough. Denies SOB, chest pain, palpitation, leg discomfort, other bruising or bleeding. Not on aspirin. CXR 10/29/13 IMPRESSION:  No active  cardiopulmonary disease. Stable chronic reticulonodular  interstitial prominence.  Electronically Signed  By: Natasha Mead M.D.  On: 10/25/2013 09:38 CXR 12/26/13- today IMPRESSION:  1. Superimposed airspace disease on chronic interstitial coarsening  in the right lower lobe is concerning for bronchopneumonia.  2. Diffuse interstitial disease is compatible with chronic  sarcoidosis.  Electronically Signed  By: Gennette Pac M.D.  On: 12/26/2013 14:32  01/22/14- 38 yoF former smoker followed here with sarcoid Stage III,  Bronch NEG 09/24/08- dx based on mediastinal adenopathy/ interstitial disease, ACE 90, +response to steroid. Iron def anemia FOLLOWS FOR: 1 month follow up, pt states she is still having fatigue but denies any hemoptysis or cough. Concerned about a spot on the roof of her mouth which she thinks increases when her ACE level is higher ACE 12/26/13- increased 99 CXR 01/16/14 IMPRESSION:  Stable fibrotic changes in this patient with sarcoidosis. No active  process.  Electronically Signed  By: Dwyane Dee M.D.  On: 01/15/2014 09:17  10/28/14-43 yoF former smoker followed here with sarcoid Stage III,  Bronch NEG 09/24/08- dx based on mediastinal adenopathy/ interstitial disease, , response to steroid. Iron def anemia FOLLOWS FOR: Pt has no current complaints today. She denies fevers, sweats, nodes, rash or cough and feels "great"  Review of Systems-see HPI    HEENT:   No headaches,  Difficulty swallowing,  Tooth/dental problems,  Sore throat,                No sneezing, itching, ear ache, nasal congestion, post nasal drip,  CV:  No- chest pain,  No-PND, swelling in lower extremities, anasarca, dizziness, palpitations GI  No heartburn, indigestion, abdominal pain, nausea, vomiting,  Resp:  No acute shortness of breath with exertion .  No excess mucus, no productive cough,  +minor non-productive cough,  No- recent coughing up of blood.  No change in color of mucus.  No wheezing.   Skin: no rash or lesions. GU: MS: joint pain .  No decreased range of motion.  No back pain. Psych:  No change in mood or affect. No depression or anxiety.  No memory loss.  Objective:   Physical Exam General- Alert, Oriented, Affect-appropriate, Distress- none acute. Looks well.  Skin-  no rash, lesions- none, excoriation- none Lymphadenopathy- none Head- atraumatic            Eyes- Gross vision intact, PERRLA, conjunctivae clear secretions            Ears- Hearing, canals normal            Nose- Clear, No-Septal dev, mucus, polyps, erosion, perforation             Throat- Mallampati II , mucosa clear , drainage- none, tonsils present.        Neck- flexible , trachea midline, no stridor , thyroid nl, carotid no bruit Chest - symmetrical excursion , unlabored  Heart/CV- RRR , no murmur , no gallop  , no rub, nl s1 s2                           - JVD- none , edema- none, stasis changes- none, varices- none           Lung-   Cough-none, chest clear, unlabored, wheeze- none,  dullness-none, rub- none           Chest wall-  Abd-  Br/ Gen/ Rectal- Not done, not indicated Extrem- cyanosis- none, clubbing, none, atrophy- none, strength- nl Neuro- grossly intact to observation

## 2014-11-18 IMAGING — CR DG CHEST 2V
2 series · 2 of 2 positions shown · non-contrast
Comparison: Two-view chest 10/25/2013

CLINICAL DATA: Hemoptysis.  Sarcoidosis.

EXAM:
CHEST  2 VIEW

[view not recorded (1 of 2)]
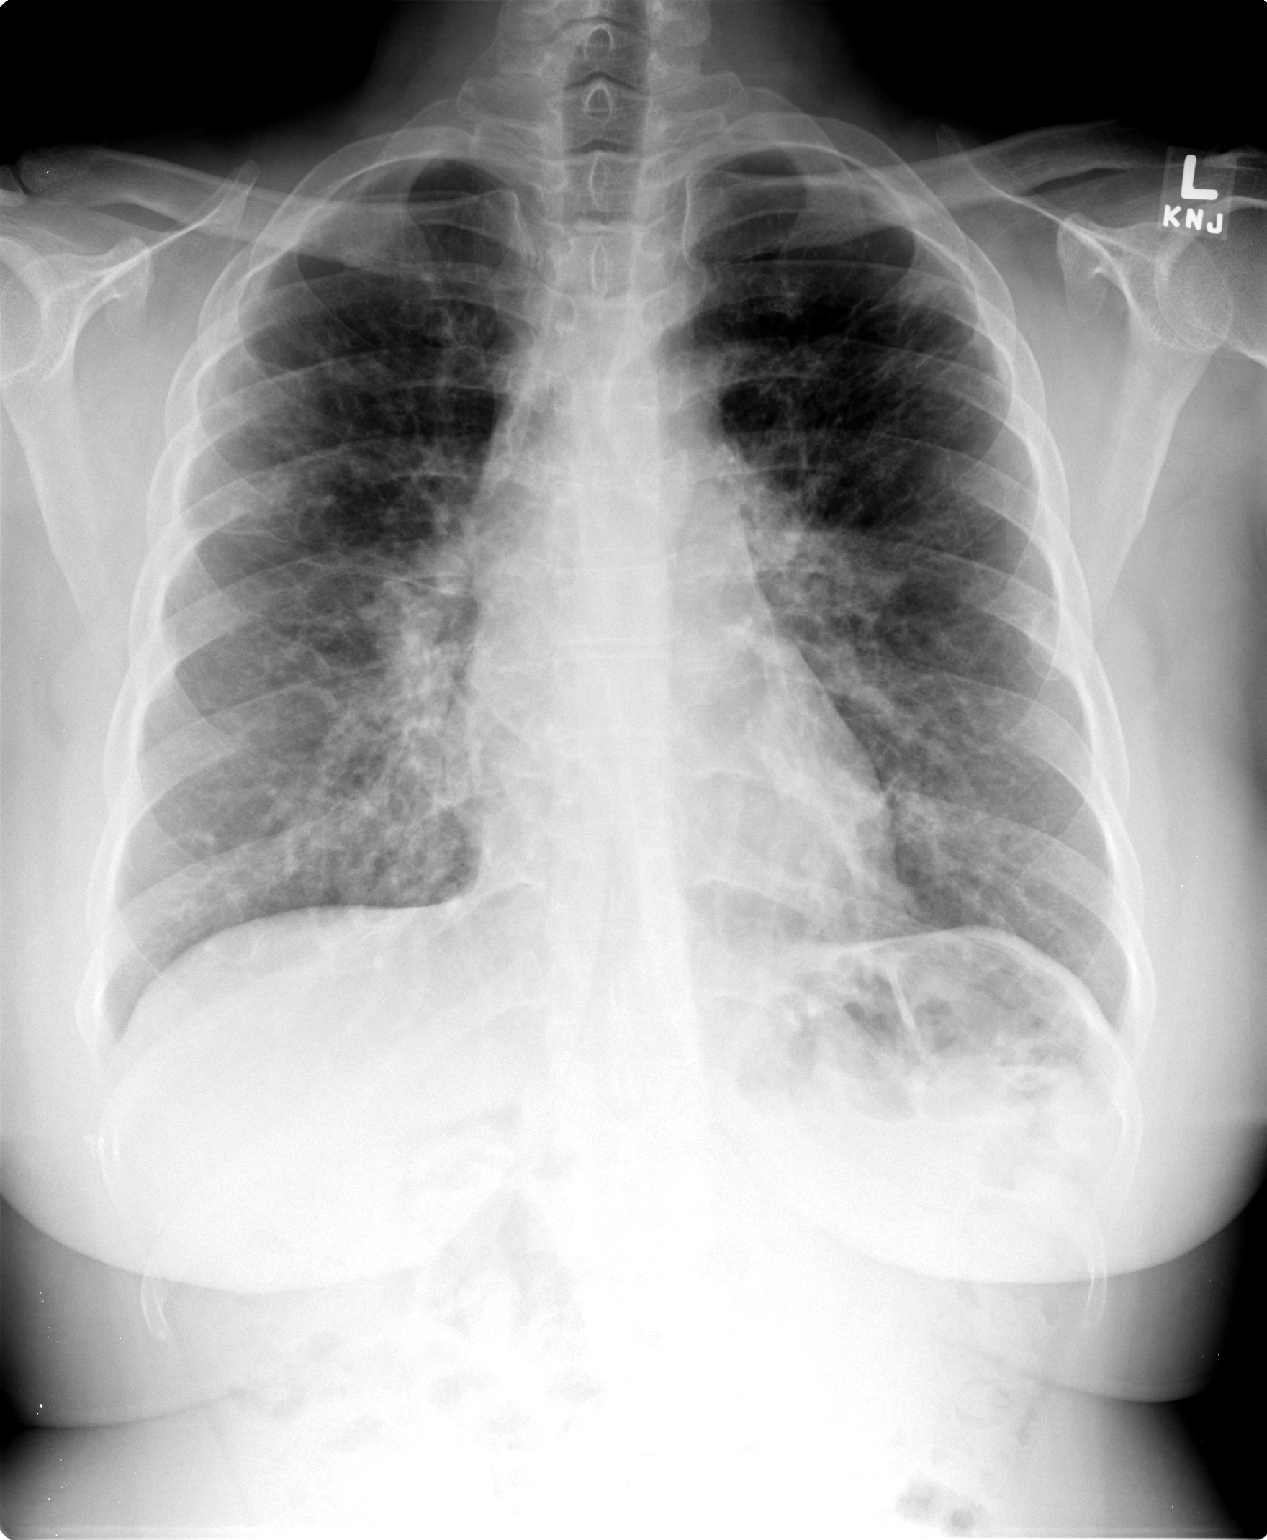

[view not recorded (2 of 2)]
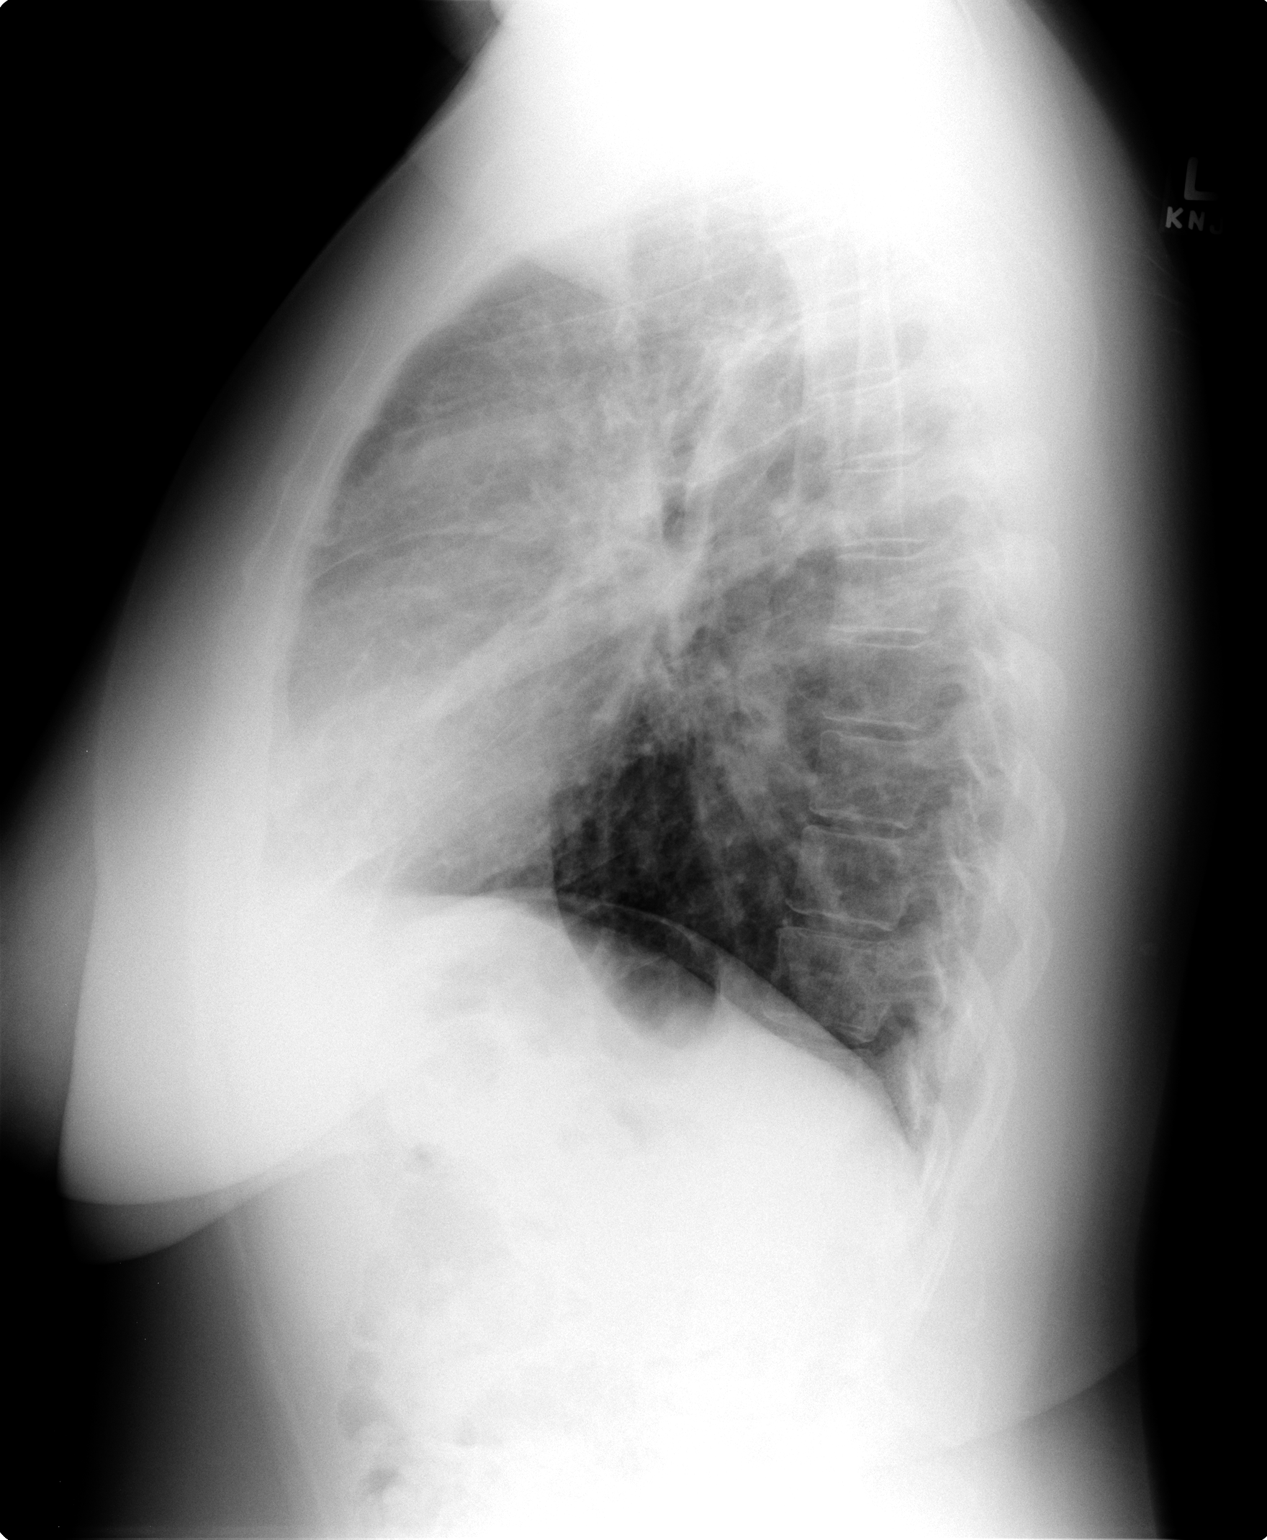

[2 of 2 positions shown; findings below may reference images not displayed]

FINDINGS: The heart size is normal. Airspace opacification is superimposed on
chronic interstitial coarsening in the right lower lobe, concerning
for superimposed bronchopneumonia. The upper lung fields are stable.
The visualized soft tissues and bony thorax are unremarkable.
IMPRESSION: 1. Superimposed airspace disease on chronic interstitial coarsening
in the right lower lobe is concerning for bronchopneumonia.
2. Diffuse interstitial disease is compatible with chronic
sarcoidosis.

## 2015-12-31 ENCOUNTER — Ambulatory Visit (INDEPENDENT_AMBULATORY_CARE_PROVIDER_SITE_OTHER): Payer: 59 | Admitting: Family

## 2015-12-31 ENCOUNTER — Encounter: Payer: Self-pay | Admitting: Family

## 2015-12-31 VITALS — BP 112/82 | HR 73 | Temp 98.2°F | Resp 16 | Wt 177.0 lb

## 2015-12-31 DIAGNOSIS — M5441 Lumbago with sciatica, right side: Secondary | ICD-10-CM

## 2015-12-31 MED ORDER — PREDNISONE 10 MG PO TABS: ORAL_TABLET | ORAL | Status: DC

## 2015-12-31 NOTE — Progress Notes (Signed)
Subjective:    Patient ID: Nancy NanasFelicia D Noel, female    DOB: 07-06-1971, 45 y.o.   MRN: [id number]   Nancy NanasFelicia D Noel is a 45 y.o. female who presents today for an acute visit.    HPI Comments: Patient presents for evaluation of low back pain with shooting pain to right thigh above the knee. 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 Pain and sciatica feels better when supine and with pillow.   Sarcoidosis has resolved; she is released from pulmonology.  Back Pain This is a recurrent problem. The current episode started yesterday. The problem occurs constantly. The problem is unchanged. The pain is present in the lumbar spine. The quality of the pain is described as shooting and stabbing (dull). The pain radiates to the right thigh. The pain is mild. The symptoms are aggravated by sitting. Pertinent negatives include no bladder incontinence, bowel incontinence, chest pain, dysuria, fever, headaches, leg pain, numbness, paresis, paresthesias, tingling or weakness. Treatments tried: Advil. The treatment provided mild relief.   Past Medical History  Diagnosis Date  . Abnormal chest x-ray   . Hemoptysis   . Unspecified asthma(493.90)   . 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 Anemia    Review of patient's allergies indicates no known allergies. Current Outpatient Prescriptions on File Prior to Visit  Medication Sig Dispense Refill  . ferrous sulfate 325 (65 FE) MG tablet Take 1 tablet (325 mg total) by mouth 2 (two) times daily with a meal. 180 tablet 3   No current facility-administered medications on file prior to visit.    Social History  Substance Use Topics  . Smoking status: Former Smoker    Types: Cigarettes    Quit date: 08/05/2008  . Smokeless tobacco: Never Used  . Alcohol Use: No    Review of Systems  Constitutional:  Negative for fever.  Cardiovascular: Negative for chest pain.  Gastrointestinal: Negative for bowel incontinence.  Genitourinary: Negative for bladder incontinence and dysuria.  Musculoskeletal: Positive for back pain.  Neurological: Negative for tingling, weakness, numbness, headaches and paresthesias.      Objective:    BP 112/82 mmHg  Pulse 73  Temp(Src) 98.2 F (36.8 C) (Oral)  Resp 16  Wt 177 lb (80.287 kg)  SpO2 98%   Physical Exam  Constitutional: She appears well-developed and well-nourished.  Eyes: Conjunctivae are normal.  Cardiovascular: Normal rate, regular rhythm, normal heart sounds and normal pulses.   Pulmonary/Chest: Effort normal and breath sounds normal. She has no wheezes. She has no rhonchi. She has no rales.  Musculoskeletal:       Lumbar back: She exhibits pain. She exhibits normal range of motion, no tenderness, no swelling, no edema, no spasm and normal pulse.       Back:  Pain is described by patient lumbar right-sided. No pain with palpation No sciatic symptoms elicited with single leg raise either leg.   Neurological: She is alert. She has normal strength. No sensory deficit.  Lower extremity strength and sensation intact.  Skin: Skin is warm and dry.  Psychiatric: She has a normal mood and affect. Her speech is normal and behavior is normal. Thought content normal.  Vitals reviewed.      Assessment & Plan:   1. Right-sided low back pain with  right-sided sciatica Trial of prednisone taper. Over-the-counter NSAIDs as appropriate. If conservative therapy fails, patient and I  agreed to physical therapy and consult with sports medicine would be next step.  - predniSONE (DELTASONE) 10 MG tablet; Take 4 tablets ( total 40 mg) by mouth for 2 days; take 3 tablets ( total 30 mg) by mouth for 2 days; take 2 tablets (total ) by mouth for 2 days; then take 1 tablet ( total ) by mouth for 2 days.  Dispense: 20 tablet; Refill: 0   I am having Ms.  Noel start on predniSONE. I am also having her maintain her ferrous sulfate.   Meds ordered this encounter  Medications  . predniSONE (DELTASONE) 10 MG tablet    Sig: Take 4 tablets ( total 40 mg) by mouth for 2 days; take 3 tablets ( total 30 mg) by mouth for 2 days; take 2 tablets (total ) by mouth for 2 days; then take 1 tablet ( total ) by mouth for 2 days.    Dispense:  20 tablet    Refill:  0    Order Specific Question:  Supervising Provider    Answer:  Tresa Garter [[id number]]     Start medications as prescribed and explained to patient on After Visit Summary ( AVS). Risks, benefits, and alternatives of the medications and treatment plan prescribed today were discussed, and patient expressed understanding.   Education regarding symptom management and diagnosis given to patient.   Follow-up:Plan follow-up as discussed or as needed if any worsening symptoms or change in condition. No Follow-up on file.   Continue to follow with Sanda Linger, MD for routine health maintenance.   Nancy Villarreal and I agreed with plan.   Rennie Plowman, FNP  25 minutes spent with patient. > 50% of time spent counseling patient related to LBP with sciatica diagnosis.

## 2015-12-31 NOTE — Progress Notes (Signed)
Pre visit review using our clinic review tool, if applicable. No additional management support is needed unless otherwise documented below in the visit note. 

## 2015-12-31 NOTE — Patient Instructions (Addendum)
Trial of prednisone taper for sciatic symptoms. May take over-the-counter ibuprofen as discussed.  If there is no improvement in your symptoms, or if there is any worsening of symptoms, or if you have any additional concerns, please return for re-evaluation; or, if we are closed, consider going to the Emergency Room for evaluation if symptoms urgent.  Sciatica With Rehab The sciatic nerve runs from the back down the leg and is responsible for sensation and control of the muscles in the back (posterior) side of the thigh, lower leg, and foot. Sciatica is a condition that is characterized by inflammation of this nerve.  SYMPTOMS   Signs of nerve damage, including numbness and/or weakness along the posterior side of the lower extremity.  Pain in the back of the thigh that may also travel down the leg.  Pain that worsens when sitting for long periods of time.  Occasionally, pain in the back or buttock. CAUSES  Inflammation of the sciatic nerve is the cause of sciatica. The inflammation is due to something irritating the nerve. Common sources of irritation include:  Sitting for long periods of time.  Direct trauma to the nerve.  Arthritis of the spine.  Herniated or ruptured disk.  Slipping of the vertebrae (spondylolisthesis).  Pressure from soft tissues, such as muscles or ligament-like tissue (fascia). RISK INCREASES WITH:  Sports that place pressure or stress on the spine (football or weightlifting).  Poor strength and flexibility.  Failure to warm up properly before activity.  Family history of low back pain or disk disorders.  Previous back injury or surgery.  Poor body mechanics, especially when lifting, or poor posture. PREVENTION   Warm up and stretch properly before activity.  Maintain physical fitness:  Strength, flexibility, and endurance.  Cardiovascular fitness.  Learn and use proper technique, especially with posture and lifting. When possible, have coach  correct improper technique.  Avoid activities that place stress on the spine. PROGNOSIS If treated properly, then sciatica usually resolves within 6 weeks. However, occasionally surgery is necessary.  RELATED COMPLICATIONS   Permanent nerve damage, including pain, numbness, tingle, or weakness.  Chronic back pain.  Risks of surgery: infection, bleeding, nerve damage, or damage to surrounding tissues. TREATMENT Treatment initially involves resting from any activities that aggravate your symptoms. The use of ice and medication may help reduce pain and inflammation. The use of strengthening and stretching exercises may help reduce pain with activity. These exercises may be performed at home or with referral to a therapist. A therapist may recommend further treatments, such as transcutaneous electronic nerve stimulation (TENS) or ultrasound. Your caregiver may recommend corticosteroid injections to help reduce inflammation of the sciatic nerve. If symptoms persist despite non-surgical (conservative) treatment, then surgery may be recommended. MEDICATION  If pain medication is necessary, then nonsteroidal anti-inflammatory medications, such as aspirin and ibuprofen, or other minor pain relievers, such as acetaminophen, are often recommended.  Do not take pain medication for 7 days before surgery.  Prescription pain relievers may be given if deemed necessary by your caregiver. Use only as directed and only as much as you need.  Ointments applied to the skin may be helpful.  Corticosteroid injections may be given by your caregiver. These injections should be reserved for the most serious cases, because they may only be given a certain number of times. HEAT AND COLD  Cold treatment (icing) relieves pain and reduces inflammation. Cold treatment should be applied for 10 to 15 minutes every 2 to 3 hours for inflammation and  pain and immediately after any activity that aggravates your symptoms. Use  ice packs or massage the area with a piece of ice (ice massage).  Heat treatment may be used prior to performing the stretching and strengthening activities prescribed by your caregiver, physical therapist, or athletic trainer. Use a heat pack or soak the injury in warm water. SEEK MEDICAL CARE IF:  Treatment seems to offer no benefit, or the condition worsens.  Any medications produce adverse side effects. EXERCISES  RANGE OF MOTION (ROM) AND STRETCHING EXERCISES - Sciatica Most people with sciatic will find that their symptoms worsen with either excessive bending forward (flexion) or arching at the low back (extension). The exercises which will help resolve your symptoms will focus on the opposite motion. Your physician, physical therapist or athletic trainer will help you determine which exercises will be most helpful to resolve your low back pain. Do not complete any exercises without first consulting with your clinician. Discontinue any exercises which worsen your symptoms until you speak to your clinician. If you have pain, numbness or tingling which travels down into your buttocks, leg or foot, the goal of the therapy is for these symptoms to move closer to your back and eventually resolve. Occasionally, these leg symptoms will get better, but your low back pain may worsen; this is typically an indication of progress in your rehabilitation. Be certain to be very alert to any changes in your symptoms and the activities in which you participated in the 24 hours prior to the change. Sharing this information with your clinician will allow him/her to most efficiently treat your condition. These exercises may help you when beginning to rehabilitate your injury. Your symptoms may resolve with or without further involvement from your physician, physical therapist or athletic trainer. While completing these exercises, remember:   Restoring tissue flexibility helps normal motion to return to the joints.  This allows healthier, less painful movement and activity.  An effective stretch should be held for at least 30 seconds.  A stretch should never be painful. You should only feel a gentle lengthening or release in the stretched tissue. FLEXION RANGE OF MOTION AND STRETCHING EXERCISES: STRETCH - Flexion, Single Knee to Chest   Lie on a firm bed or floor with both legs extended in front of you.  Keeping one leg in contact with the floor, bring your opposite knee to your chest. Hold your leg in place by either grabbing behind your thigh or at your knee.  Pull until you feel a gentle stretch in your low back. Hold __________ seconds.  Slowly release your grasp and repeat the exercise with the opposite side. Repeat __________ times. Complete this exercise __________ times per day.  STRETCH - Flexion, Double Knee to Chest  Lie on a firm bed or floor with both legs extended in front of you.  Keeping one leg in contact with the floor, bring your opposite knee to your chest.  Tense your stomach muscles to support your back and then lift your other knee to your chest. Hold your legs in place by either grabbing behind your thighs or at your knees.  Pull both knees toward your chest until you feel a gentle stretch in your low back. Hold __________ seconds.  Tense your stomach muscles and slowly return one leg at a time to the floor. Repeat __________ times. Complete this exercise __________ times per day.  STRETCH - Low Trunk Rotation   Lie on a firm bed or floor. Keeping your legs  in front of you, bend your knees so they are both pointed toward the ceiling and your feet are flat on the floor.  Extend your arms out to the side. This will stabilize your upper body by keeping your shoulders in contact with the floor.  Gently and slowly drop both knees together to one side until you feel a gentle stretch in your low back. Hold for __________ seconds.  Tense your stomach muscles to support your  low back as you bring your knees back to the starting position. Repeat the exercise to the other side. Repeat __________ times. Complete this exercise __________ times per day  EXTENSION RANGE OF MOTION AND FLEXIBILITY EXERCISES: STRETCH - Extension, Prone on Elbows  Lie on your stomach on the floor, a bed will be too soft. Place your palms about shoulder width apart and at the height of your head.  Place your elbows under your shoulders. If this is too painful, stack pillows under your chest.  Allow your body to relax so that your hips drop lower and make contact more completely with the floor.  Hold this position for __________ seconds.  Slowly return to lying flat on the floor. Repeat __________ times. Complete this exercise __________ times per day.  RANGE OF MOTION - Extension, Prone Press Ups  Lie on your stomach on the floor, a bed will be too soft. Place your palms about shoulder width apart and at the height of your head.  Keeping your back as relaxed as possible, slowly straighten your elbows while keeping your hips on the floor. You may adjust the placement of your hands to maximize your comfort. As you gain motion, your hands will come more underneath your shoulders.  Hold this position __________ seconds.  Slowly return to lying flat on the floor. Repeat __________ times. Complete this exercise __________ times per day.  STRENGTHENING EXERCISES - Sciatica  These exercises may help you when beginning to rehabilitate your injury. These exercises should be done near your "sweet spot." This is the neutral, low-back arch, somewhere between fully rounded and fully arched, that is your least painful position. When performed in this safe range of motion, these exercises can be used for people who have either a flexion or extension based injury. These exercises may resolve your symptoms with or without further involvement from your physician, physical therapist or athletic trainer.  While completing these exercises, remember:   Muscles can gain both the endurance and the strength needed for everyday activities through controlled exercises.  Complete these exercises as instructed by your physician, physical therapist or athletic trainer. Progress with the resistance and repetition exercises only as your caregiver advises.  You may experience muscle soreness or fatigue, but the pain or discomfort you are trying to eliminate should never worsen during these exercises. If this pain does worsen, stop and make certain you are following the directions exactly. If the pain is still present after adjustments, discontinue the exercise until you can discuss the trouble with your clinician. STRENGTHENING - Deep Abdominals, Pelvic Tilt   Lie on a firm bed or floor. Keeping your legs in front of you, bend your knees so they are both pointed toward the ceiling and your feet are flat on the floor.  Tense your lower abdominal muscles to press your low back into the floor. This motion will rotate your pelvis so that your tail bone is scooping upwards rather than pointing at your feet or into the floor.  With a gentle tension  and even breathing, hold this position for __________ seconds. Repeat __________ times. Complete this exercise __________ times per day.  STRENGTHENING - Abdominals, Crunches   Lie on a firm bed or floor. Keeping your legs in front of you, bend your knees so they are both pointed toward the ceiling and your feet are flat on the floor. Cross your arms over your chest.  Slightly tip your chin down without bending your neck.  Tense your abdominals and slowly lift your trunk high enough to just clear your shoulder blades. Lifting higher can put excessive stress on the low back and does not further strengthen your abdominal muscles.  Control your return to the starting position. Repeat __________ times. Complete this exercise __________ times per day.  STRENGTHENING -  Quadruped, Opposite UE/LE Lift  Assume a hands and knees position on a firm surface. Keep your hands under your shoulders and your knees under your hips. You may place padding under your knees for comfort.  Find your neutral spine and gently tense your abdominal muscles so that you can maintain this position. Your shoulders and hips should form a rectangle that is parallel with the floor and is not twisted.  Keeping your trunk steady, lift your right hand no higher than your shoulder and then your left leg no higher than your hip. Make sure you are not holding your breath. Hold this position __________ seconds.  Continuing to keep your abdominal muscles tense and your back steady, slowly return to your starting position. Repeat with the opposite arm and leg. Repeat __________ times. Complete this exercise __________ times per day.  STRENGTHENING - Abdominals and Quadriceps, Straight Leg Raise   Lie on a firm bed or floor with both legs extended in front of you.  Keeping one leg in contact with the floor, bend the other knee so that your foot can rest flat on the floor.  Find your neutral spine, and tense your abdominal muscles to maintain your spinal position throughout the exercise.  Slowly lift your straight leg off the floor about 6 inches for a count of 15, making sure to not hold your breath.  Still keeping your neutral spine, slowly lower your leg all the way to the floor. Repeat this exercise with each leg __________ times. Complete this exercise __________ times per day. POSTURE AND BODY MECHANICS CONSIDERATIONS - Sciatica Keeping correct posture when sitting, standing or completing your activities will reduce the stress put on different body tissues, allowing injured tissues a chance to heal and limiting painful experiences. The following are general guidelines for improved posture. Your physician or physical therapist will provide you with any instructions specific to your needs.  While reading these guidelines, remember:  The exercises prescribed by your provider will help you have the flexibility and strength to maintain correct postures.  The correct posture provides the optimal environment for your joints to work. All of your joints have less wear and tear when properly supported by a spine with good posture. This means you will experience a healthier, less painful body.  Correct posture must be practiced with all of your activities, especially prolonged sitting and standing. Correct posture is as important when doing repetitive low-stress activities (typing) as it is when doing a single heavy-load activity (lifting). RESTING POSITIONS Consider which positions are most painful for you when choosing a resting position. If you have pain with flexion-based activities (sitting, bending, stooping, squatting), choose a position that allows you to rest in a less flexed posture.  You would want to avoid curling into a fetal position on your side. If your pain worsens with extension-based activities (prolonged standing, working overhead), avoid resting in an extended position such as sleeping on your stomach. Most people will find more comfort when they rest with their spine in a more neutral position, neither too rounded nor too arched. Lying on a non-sagging bed on your side with a pillow between your knees, or on your back with a pillow under your knees will often provide some relief. Keep in mind, being in any one position for a prolonged period of time, no matter how correct your posture, can still lead to stiffness. PROPER SITTING POSTURE In order to minimize stress and discomfort on your spine, you must sit with correct posture Sitting with good posture should be effortless for a healthy body. Returning to good posture is a gradual process. Many people can work toward this most comfortably by using various supports until they have the flexibility and strength to maintain this  posture on their own. When sitting with proper posture, your ears will fall over your shoulders and your shoulders will fall over your hips. You should use the back of the chair to support your upper back. Your low back will be in a neutral position, just slightly arched. You may place a small pillow or folded towel at the base of your low back for support.  When working at a desk, create an environment that supports good, upright posture. Without extra support, muscles fatigue and lead to excessive strain on joints and other tissues. Keep these recommendations in mind: CHAIR:   A chair should be able to slide under your desk when your back makes contact with the back of the chair. This allows you to work closely.  The chair's height should allow your eyes to be level with the upper part of your monitor and your hands to be slightly lower than your elbows. BODY POSITION  Your feet should make contact with the floor. If this is not possible, use a foot rest.  Keep your ears over your shoulders. This will reduce stress on your neck and low back. INCORRECT SITTING POSTURES   If you are feeling tired and unable to assume a healthy sitting posture, do not slouch or slump. This puts excessive strain on your back tissues, causing more damage and pain. Healthier options include:  Using more support, like a lumbar pillow.  Switching tasks to something that requires you to be upright or walking.  Talking a brief walk.  Lying down to rest in a neutral-spine position. PROLONGED STANDING WHILE SLIGHTLY LEANING FORWARD  When completing a task that requires you to lean forward while standing in one place for a long time, place either foot up on a stationary 2-4 inch high object to help maintain the best posture. When both feet are on the ground, the low back tends to lose its slight inward curve. If this curve flattens (or becomes too large), then the back and your other joints will experience too much  stress, fatigue more quickly and can cause pain.  CORRECT STANDING POSTURES Proper standing posture should be assumed with all daily activities, even if they only take a few moments, like when brushing your teeth. As in sitting, your ears should fall over your shoulders and your shoulders should fall over your hips. You should keep a slight tension in your abdominal muscles to brace your spine. Your tailbone should point down to the ground, not  behind your body, resulting in an over-extended swayback posture.  INCORRECT STANDING POSTURES  Common incorrect standing postures include a forward head, locked knees and/or an excessive swayback. WALKING Walk with an upright posture. Your ears, shoulders and hips should all line-up. PROLONGED ACTIVITY IN A FLEXED POSITION When completing a task that requires you to bend forward at your waist or lean over a low surface, try to find a way to stabilize 3 of 4 of your limbs. You can place a hand or elbow on your thigh or rest a knee on the surface you are reaching across. This will provide you more stability so that your muscles do not fatigue as quickly. By keeping your knees relaxed, or slightly bent, you will also reduce stress across your low back. CORRECT LIFTING TECHNIQUES DO :   Assume a wide stance. This will provide you more stability and the opportunity to get as close as possible to the object which you are lifting.  Tense your abdominals to brace your spine; then bend at the knees and hips. Keeping your back locked in a neutral-spine position, lift using your leg muscles. Lift with your legs, keeping your back straight.  Test the weight of unknown objects before attempting to lift them.  Try to keep your elbows locked down at your sides in order get the best strength from your shoulders when carrying an object.  Always ask for help when lifting heavy or awkward objects. INCORRECT LIFTING TECHNIQUES DO NOT:   Lock your knees when lifting, even  if it is a small object.  Bend and twist. Pivot at your feet or move your feet when needing to change directions.  Assume that you cannot safely pick up a paperclip without proper posture.   This information is not intended to replace advice given to you by your health care provider. Make sure you discuss any questions you have with your health care provider.   Document Released: 08/22/2005 Document Revised: 01/06/2015 Document Reviewed: 12/04/2008 [employer] Patient Education [employer]

## 2016-01-21 ENCOUNTER — Encounter: Payer: Self-pay | Admitting: Internal Medicine

## 2016-01-21 ENCOUNTER — Ambulatory Visit (INDEPENDENT_AMBULATORY_CARE_PROVIDER_SITE_OTHER): Payer: 59 | Admitting: Internal Medicine

## 2016-01-21 ENCOUNTER — Other Ambulatory Visit (INDEPENDENT_AMBULATORY_CARE_PROVIDER_SITE_OTHER): Payer: 59

## 2016-01-21 ENCOUNTER — Ambulatory Visit (INDEPENDENT_AMBULATORY_CARE_PROVIDER_SITE_OTHER)
Admission: RE | Admit: 2016-01-21 | Discharge: 2016-01-21 | Disposition: A | Discharge status: Home / Self Care | Payer: 59 | Source: Ambulatory Visit | Attending: Internal Medicine | Admitting: Internal Medicine

## 2016-01-21 VITALS — BP 110/80 | HR 74 | Temp 98.6°F | Resp 16 | Ht 63.0 in | Wt 173.0 lb

## 2016-01-21 DIAGNOSIS — Z1231 Encounter for screening mammogram for malignant neoplasm of breast: Secondary | ICD-10-CM

## 2016-01-21 DIAGNOSIS — M79671 Pain in right foot: Secondary | ICD-10-CM | POA: Diagnosis not present

## 2016-01-21 DIAGNOSIS — D509 Iron deficiency anemia, unspecified: Secondary | ICD-10-CM

## 2016-01-21 DIAGNOSIS — Z0000 Encounter for general adult medical examination without abnormal findings: Secondary | ICD-10-CM

## 2016-01-21 LAB — CBC WITH DIFFERENTIAL/PLATELET
Basophils Absolute: 0 10*3/uL (ref 0.0–0.1)
Basophils Relative: 0.4 % (ref 0.0–3.0)
EOS PCT: 2 % (ref 0.0–5.0)
Eosinophils Absolute: 0.1 10*3/uL (ref 0.0–0.7)
HCT: 36.5 % (ref 36.0–46.0)
Hemoglobin: 12 g/dL (ref 12.0–15.0)
LYMPHS ABS: 1.6 10*3/uL (ref 0.7–4.0)
Lymphocytes Relative: 38.9 % (ref 12.0–46.0)
MCHC: 32.9 g/dL (ref 30.0–36.0)
MCV: 81.8 fl (ref 78.0–100.0)
Monocytes Absolute: 0.4 10*3/uL (ref 0.1–1.0)
Monocytes Relative: 9.4 % (ref 3.0–12.0)
NEUTROS PCT: 49.3 % (ref 43.0–77.0)
Neutro Abs: 2 10*3/uL (ref 1.4–7.7)
Platelets: 175 10*3/uL (ref 150.0–400.0)
RBC: 4.46 Mil/uL (ref 3.87–5.11)
RDW: 13 % (ref 11.5–15.5)
WBC: 4.1 10*3/uL (ref 4.0–10.5)

## 2016-01-21 LAB — LIPID PANEL
Cholesterol: 231 mg/dL — ABNORMAL HIGH (ref 0–200)
HDL: 77.5 mg/dL (ref >39.00)
LDL Cholesterol: 132 mg/dL — ABNORMAL HIGH (ref 0–99)
NonHDL: 153.3
Total CHOL/HDL Ratio: 3
Triglycerides: 109 mg/dL (ref 0.0–149.0)
VLDL: 21.8 mg/dL (ref 0.0–40.0)

## 2016-01-21 LAB — COMPREHENSIVE METABOLIC PANEL
ALT: 9 U/L (ref 0–35)
AST: 15 U/L (ref 0–37)
Albumin: 4 g/dL (ref 3.5–5.2)
Alkaline Phosphatase: 45 U/L (ref 39–117)
BUN: 13 mg/dL (ref 6–23)
CO2: 26 mEq/L (ref 19–32)
Calcium: 9.2 mg/dL (ref 8.4–10.5)
Chloride: 103 mEq/L (ref 96–112)
Creatinine, Ser: 0.74 mg/dL (ref 0.40–1.20)
GFR: 108.57 mL/min (ref >60.00)
Glucose, Bld: 96 mg/dL (ref 70–99)
Potassium: 4.4 mEq/L (ref 3.5–5.1)
Sodium: 135 mEq/L (ref 135–145)
Total Bilirubin: 0.3 mg/dL (ref 0.2–1.2)
Total Protein: 8.2 g/dL (ref 6.0–8.3)

## 2016-01-21 LAB — IBC PANEL
Iron: 51 ug/dL (ref 42–145)
SATURATION RATIOS: 11.9 % — AB (ref 20.0–50.0)
Transferrin: 305 mg/dL (ref 212.0–360.0)

## 2016-01-21 LAB — FERRITIN: Ferritin: 2.9 ng/mL — ABNORMAL LOW (ref 10.0–291.0)

## 2016-01-21 LAB — TSH: TSH: 1.28 u[IU]/mL (ref 0.35–4.50)

## 2016-01-21 MED ORDER — FERRALET 90 90-1 MG PO TABS: 1.0000 | ORAL_TABLET | Freq: Every day | ORAL | Status: DC

## 2016-01-21 NOTE — Progress Notes (Signed)
Subjective:  Patient ID: Nancy Villarreal, female    DOB: 12-17-1970  Age: 45 y.o. MRN: [id number]  CC: Anemia; Annual Exam; and Foot Injury  NEW TO ME  HPI Nancy Villarreal presents for a CPX.  She complains of discomfort in her right foot over the last 3-4 years and tells me that she had a vague foot injury about 20 years ago. She feels like there is a bony prominence on the side of her right foot that wasn't there prior to 3 or 4 years ago. It bothers her intermittently. She recently had an episode of low back pain but that has resolved. She was told that she has degenerative disc disease.  She has a history of iron deficiency anemia anemia due to heavy menstrual cycles but recently denies shortness of breath, fatigue, or PICA    History Nancy SchleinFelicia has a past medical history of Abnormal chest x-ray; Hemoptysis; Unspecified asthma(493.90); Sarcoid (HCC); and Anemia.   She has past surgical history that includes Tubal ligation; Cesarean section; and Bronchoscopy (09-2008).   Her family history includes Alcohol abuse in her paternal grandmother; Asthma in her paternal grandfather; Cancer in her paternal grandmother; Diabetes in her father; Early death in her paternal grandmother; Hyperlipidemia in her maternal grandmother and mother; Hypertension in her mother; Lung cancer in her maternal grandfather; Stroke in her maternal grandmother.She reports that she quit smoking about 7 years ago. Her smoking use included Cigarettes. She has never used smokeless tobacco. She reports that she does not drink alcohol or use illicit drugs.  Outpatient Prescriptions Prior to Visit  Medication Sig Dispense Refill  . ferrous sulfate 325 (65 FE) MG tablet Take 1 tablet (325 mg total) by mouth 2 (two) times daily with a meal. 180 tablet 3  . predniSONE (DELTASONE) 10 MG tablet Take 4 tablets ( total 40 mg) by mouth for 2 days; take 3 tablets ( total 30 mg) by mouth for 2 days; take 2 tablets (total 20mg ) by mouth  for 2 days; then take 1 tablet ( total 10mg ) by mouth for 2 days. 20 tablet 0   No facility-administered medications prior to visit.    ROS Review of Systems  Constitutional: Negative.  Negative for fever, chills and fatigue.  HENT: Negative.   Eyes: Negative.  Negative for visual disturbance.  Respiratory: Negative.  Negative for cough, choking, chest tightness, shortness of breath and stridor.   Cardiovascular: Negative.  Negative for chest pain, palpitations and leg swelling.  Gastrointestinal: Negative.  Negative for nausea, vomiting, abdominal pain, diarrhea and blood in stool.  Endocrine: Negative.   Genitourinary: Negative.   Musculoskeletal: Positive for arthralgias. Negative for myalgias, back pain and neck pain.  Skin: Negative.  Negative for color change, rash and wound.  Allergic/Immunologic: Negative.   Neurological: Negative.  Negative for dizziness, tremors, weakness, light-headedness, numbness and headaches.  Hematological: Negative.  Negative for adenopathy. Does not bruise/bleed easily.  Psychiatric/Behavioral: Negative.     Objective:  BP 110/80 mmHg  Pulse 74  Temp(Src) 98.6 F (37 C) (Oral)  Resp 16  Ht 5\' 3"  (1.6 m)  Wt 173 lb (78.472 kg)  BMI 30.65 kg/m2  SpO2 98%  LMP 01/13/2016  Physical Exam  Constitutional: She is oriented to person, place, and time. She appears well-developed and well-nourished. No distress.  HENT:  Mouth/Throat: Oropharynx is clear and moist. No oropharyngeal exudate.  Eyes: Conjunctivae are normal. Right eye exhibits no discharge. Left eye exhibits no discharge. No scleral icterus.  Neck: Normal range of motion. Neck supple. No JVD present. No tracheal deviation present. No thyromegaly present.  Cardiovascular: Normal rate, regular rhythm, normal heart sounds and intact distal pulses.  Exam reveals no gallop and no friction rub.   No murmur heard. Pulmonary/Chest: Effort normal and breath sounds normal. No stridor. No  respiratory distress. She has no wheezes. She has no rales. She exhibits no tenderness.  Abdominal: Soft. Bowel sounds are normal. She exhibits no distension and no mass. There is no tenderness. There is no rebound and no guarding.  Musculoskeletal: Normal range of motion. She exhibits no edema or tenderness.       Right foot: Normal. There is normal range of motion, no tenderness, no bony tenderness, no swelling, normal capillary refill, no crepitus, no deformity and no laceration.  Lymphadenopathy:    She has no cervical adenopathy.  Neurological: She is oriented to person, place, and time.  Skin: Skin is warm and dry. No rash noted. She is not diaphoretic. No erythema. No pallor.  Vitals reviewed.   Lab Results  Component Value Date   WBC 4.1 01/21/2016   HGB 12.0 01/21/2016   HCT 36.5 01/21/2016   PLT 175.0 01/21/2016   GLUCOSE 96 01/21/2016   CHOL 231* 01/21/2016   TRIG 109.0 01/21/2016   HDL 77.50 01/21/2016   LDLCALC 132* 01/21/2016   ALT 9 01/21/2016   AST 15 01/21/2016   NA 135 01/21/2016   K 4.4 01/21/2016   CL 103 01/21/2016   CREATININE 0.74 01/21/2016   BUN 13 01/21/2016   CO2 26 01/21/2016   TSH 1.28 01/21/2016   INR 1.1 RATIO* 09/03/2008    Assessment & Plan:   Talayeh was seen today for anemia, annual exam and foot injury.  Diagnoses and all orders for this visit:  Routine general medical examination at a health care facility- exam completed, labs ordered and reviewed, her Pap smears up-to-date, she was referred for her annual mammogram, patient education material was given. -     Lipid panel; Future -     Comprehensive metabolic panel; Future -     CBC with Differential/Platelet; Future -     TSH; Future  Visit for screening mammogram -     MM DIGITAL SCREENING BILATERAL; Future  Anemia, iron deficiency- she has a low normal hemoglobin level and her ferritin is low, this indicates that she has a low storage of iron so I have asked her to restart an  iron supplement. -     IBC panel; Future -     Ferritin; Future -     Fe Cbn-Fe Gluc-FA-B12-C-DSS (FERRALET 90) 90-1 MG TABS; Take 1 tablet by mouth daily.  Right foot pain- examination of her foot is normal and the x-ray is unremarkable, if the foot pain persists and I will refer her to podiatry. -     DG Foot Complete Right; Future   I have discontinued Ms. Villarreal's ferrous sulfate and predniSONE. I am also having her start on FERRALET 90.  Meds ordered this encounter  Medications  . Fe Cbn-Fe Gluc-FA-B12-C-DSS (FERRALET 90) 90-1 MG TABS    Sig: Take 1 tablet by mouth daily.    Dispense:  30 each    Refill:  11     Follow-up: Return if symptoms worsen or fail to improve.  Sanda Linger, MD

## 2016-01-21 NOTE — Patient Instructions (Signed)
Preventive Care for Adults, Female A healthy lifestyle and preventive care can promote health and wellness. Preventive health guidelines for women include the following key practices.  A routine yearly physical is a good way to check with your health care provider about your health and preventive screening. It is a chance to share any concerns and updates on your health and to receive a thorough exam.  Visit your dentist for a routine exam and preventive care every 6 months. Brush your teeth twice a day and floss once a day. Good oral hygiene prevents tooth decay and gum disease.  The frequency of eye exams is based on your age, health, family medical history, use of contact lenses, and other factors. Follow your health care provider's recommendations for frequency of eye exams.  Eat a healthy diet. Foods like vegetables, fruits, whole grains, low-fat dairy products, and lean protein foods contain the nutrients you need without too many calories. Decrease your intake of foods high in solid fats, added sugars, and salt. Eat the right amount of calories for you.Get information about a proper diet from your health care provider, if necessary.  Regular physical exercise is one of the most important things you can do for your health. Most adults should get at least 150 minutes of moderate-intensity exercise (any activity that increases your heart rate and causes you to sweat) each week. In addition, most adults need muscle-strengthening exercises on 2 or more days a week.  Maintain a healthy weight. The body mass index (BMI) is a screening tool to identify possible weight problems. It provides an estimate of body fat based on height and weight. Your health care provider can find your BMI and can help you achieve or maintain a healthy weight.For adults 20 years and older:  A BMI below 18.5 is considered underweight.  A BMI of 18.5 to 24.9 is normal.  A BMI of 25 to 29.9 is considered overweight.  A  BMI of 30 and above is considered obese.  Maintain normal blood lipids and cholesterol levels by exercising and minimizing your intake of saturated fat. Eat a balanced diet with plenty of fruit and vegetables. Blood tests for lipids and cholesterol should begin at age 45 and be repeated every 5 years. If your lipid or cholesterol levels are high, you are over 50, or you are at high risk for heart disease, you may need your cholesterol levels checked more frequently.Ongoing high lipid and cholesterol levels should be treated with medicines if diet and exercise are not working.  If you smoke, find out from your health care provider how to quit. If you do not use tobacco, do not start.  Lung cancer screening is recommended for adults aged 45-80 years who are at high risk for developing lung cancer because of a history of smoking. A yearly low-dose CT scan of the lungs is recommended for people who have at least a 30-pack-year history of smoking and are a current smoker or have quit within the past 15 years. A pack year of smoking is smoking an average of 1 pack of cigarettes a day for 1 year (for example: 1 pack a day for 30 years or 2 packs a day for 15 years). Yearly screening should continue until the smoker has stopped smoking for at least 15 years. Yearly screening should be stopped for people who develop a health problem that would prevent them from having lung cancer treatment.  If you are pregnant, do not drink alcohol. If you are  breastfeeding, be very cautious about drinking alcohol. If you are not pregnant and choose to drink alcohol, do not have more than 1 drink per day. One drink is considered to be 12 ounces (355 mL) of beer, 5 ounces (148 mL) of wine, or 1.5 ounces (44 mL) of liquor.  Avoid use of street drugs. Do not share needles with anyone. Ask for help if you need support or instructions about stopping the use of drugs.  High blood pressure causes heart disease and increases the risk  of stroke. Your blood pressure should be checked at least every 1 to 2 years. Ongoing high blood pressure should be treated with medicines if weight loss and exercise do not work.  If you are 55-79 years old, ask your health care provider if you should take aspirin to prevent strokes.  Diabetes screening is done by taking a blood sample to check your blood glucose level after you have not eaten for a certain period of time (fasting). If you are not overweight and you do not have risk factors for diabetes, you should be screened once every 3 years starting at age 45. If you are overweight or obese and you are 40-70 years of age, you should be screened for diabetes every year as part of your cardiovascular risk assessment.  Breast cancer screening is essential preventive care for women. You should practice "breast self-awareness." This means understanding the normal appearance and feel of your breasts and may include breast self-examination. Any changes detected, no matter how small, should be reported to a health care provider. Women in their 20s and 30s should have a clinical breast exam (CBE) by a health care provider as part of a regular health exam every 1 to 3 years. After age 40, women should have a CBE every year. Starting at age 40, women should consider having a mammogram (breast X-ray test) every year. Women who have a family history of breast cancer should talk to their health care provider about genetic screening. Women at a high risk of breast cancer should talk to their health care providers about having an MRI and a mammogram every year.  Breast cancer gene (BRCA)-related cancer risk assessment is recommended for women who have family members with BRCA-related cancers. BRCA-related cancers include breast, ovarian, tubal, and peritoneal cancers. Having family members with these cancers may be associated with an increased risk for harmful changes (mutations) in the breast cancer genes BRCA1 and  BRCA2. Results of the assessment will determine the need for genetic counseling and BRCA1 and BRCA2 testing.  Your health care provider may recommend that you be screened regularly for cancer of the pelvic organs (ovaries, uterus, and vagina). This screening involves a pelvic examination, including checking for microscopic changes to the surface of your cervix (Pap test). You may be encouraged to have this screening done every 3 years, beginning at age 21.  For women ages 30-65, health care providers may recommend pelvic exams and Pap testing every 3 years, or they may recommend the Pap and pelvic exam, combined with testing for human papilloma virus (HPV), every 5 years. Some types of HPV increase your risk of cervical cancer. Testing for HPV may also be done on women of any age with unclear Pap test results.  Other health care providers may not recommend any screening for nonpregnant women who are considered low risk for pelvic cancer and who do not have symptoms. Ask your health care provider if a screening pelvic exam is right for   you.  If you have had past treatment for cervical cancer or a condition that could lead to cancer, you need Pap tests and screening for cancer for at least 20 years after your treatment. If Pap tests have been discontinued, your risk factors (such as having a new sexual partner) need to be reassessed to determine if screening should resume. Some women have medical problems that increase the chance of getting cervical cancer. In these cases, your health care provider may recommend more frequent screening and Pap tests.  Colorectal cancer can be detected and often prevented. Most routine colorectal cancer screening begins at the age of 50 years and continues through age 75 years. However, your health care provider may recommend screening at an earlier age if you have risk factors for colon cancer. On a yearly basis, your health care provider may provide home test kits to check  for hidden blood in the stool. Use of a small camera at the end of a tube, to directly examine the colon (sigmoidoscopy or colonoscopy), can detect the earliest forms of colorectal cancer. Talk to your health care provider about this at age 50, when routine screening begins. Direct exam of the colon should be repeated every 5-10 years through age 75 years, unless early forms of precancerous polyps or small growths are found.  People who are at an increased risk for hepatitis B should be screened for this virus. You are considered at high risk for hepatitis B if:  You were born in a country where hepatitis B occurs often. Talk with your health care provider about which countries are considered high risk.  Your parents were born in a high-risk country and you have not received a shot to protect against hepatitis B (hepatitis B vaccine).  You have HIV or AIDS.  You use needles to inject street drugs.  You live with, or have sex with, someone who has hepatitis B.  You get hemodialysis treatment.  You take certain medicines for conditions like cancer, organ transplantation, and autoimmune conditions.  Hepatitis C blood testing is recommended for all people born from 1945 through 1965 and any individual with known risks for hepatitis C.  Practice safe sex. Use condoms and avoid high-risk sexual practices to reduce the spread of sexually transmitted infections (STIs). STIs include gonorrhea, chlamydia, syphilis, trichomonas, herpes, HPV, and human immunodeficiency virus (HIV). Herpes, HIV, and HPV are viral illnesses that have no cure. They can result in disability, cancer, and death.  You should be screened for sexually transmitted illnesses (STIs) including gonorrhea and chlamydia if:  You are sexually active and are younger than 24 years.  You are older than 24 years and your health care provider tells you that you are at risk for this type of infection.  Your sexual activity has changed  since you were last screened and you are at an increased risk for chlamydia or gonorrhea. Ask your health care provider if you are at risk.  If you are at risk of being infected with HIV, it is recommended that you take a prescription medicine daily to prevent HIV infection. This is called preexposure prophylaxis (PrEP). You are considered at risk if:  You are sexually active and do not regularly use condoms or know the HIV status of your partner(s).  You take drugs by injection.  You are sexually active with a partner who has HIV.  Talk with your health care provider about whether you are at high risk of being infected with HIV. If   you choose to begin PrEP, you should first be tested for HIV. You should then be tested every 3 months for as long as you are taking PrEP.  Osteoporosis is a disease in which the bones lose minerals and strength with aging. This can result in serious bone fractures or breaks. The risk of osteoporosis can be identified using a bone density scan. Women ages 67 years and over and women at risk for fractures or osteoporosis should discuss screening with their health care providers. Ask your health care provider whether you should take a calcium supplement or vitamin D to reduce the rate of osteoporosis.  Menopause can be associated with physical symptoms and risks. Hormone replacement therapy is available to decrease symptoms and risks. You should talk to your health care provider about whether hormone replacement therapy is right for you.  Use sunscreen. Apply sunscreen liberally and repeatedly throughout the day. You should seek shade when your shadow is shorter than you. Protect yourself by wearing long sleeves, pants, a wide-brimmed hat, and sunglasses year round, whenever you are outdoors.  Once a month, do a whole body skin exam, using a mirror to look at the skin on your back. Tell your health care provider of new moles, moles that have irregular borders, moles that  are larger than a pencil eraser, or moles that have changed in shape or color.  Stay current with required vaccines (immunizations).  Influenza vaccine. All adults should be immunized every year.  Tetanus, diphtheria, and acellular pertussis (Td, Tdap) vaccine. Pregnant women should receive 1 dose of Tdap vaccine during each pregnancy. The dose should be obtained regardless of the length of time since the last dose. Immunization is preferred during the 27th-36th week of gestation. An adult who has not previously received Tdap or who does not know her vaccine status should receive 1 dose of Tdap. This initial dose should be followed by tetanus and diphtheria toxoids (Td) booster doses every 10 years. Adults with an unknown or incomplete history of completing a 3-dose immunization series with Td-containing vaccines should begin or complete a primary immunization series including a Tdap dose. Adults should receive a Td booster every 10 years.  Varicella vaccine. An adult without evidence of immunity to varicella should receive 2 doses or a second dose if she has previously received 1 dose. Pregnant females who do not have evidence of immunity should receive the first dose after pregnancy. This first dose should be obtained before leaving the health care facility. The second dose should be obtained 4-8 weeks after the first dose.  Human papillomavirus (HPV) vaccine. Females aged 13-26 years who have not received the vaccine previously should obtain the 3-dose series. The vaccine is not recommended for use in pregnant females. However, pregnancy testing is not needed before receiving a dose. If a female is found to be pregnant after receiving a dose, no treatment is needed. In that case, the remaining doses should be delayed until after the pregnancy. Immunization is recommended for any person with an immunocompromised condition through the age of 61 years if she did not get any or all doses earlier. During the  3-dose series, the second dose should be obtained 4-8 weeks after the first dose. The third dose should be obtained 24 weeks after the first dose and 16 weeks after the second dose.  Zoster vaccine. One dose is recommended for adults aged 30 years or older unless certain conditions are present.  Measles, mumps, and rubella (MMR) vaccine. Adults born  before 1957 generally are considered immune to measles and mumps. Adults born in 1957 or later should have 1 or more doses of MMR vaccine unless there is a contraindication to the vaccine or there is laboratory evidence of immunity to each of the three diseases. A routine second dose of MMR vaccine should be obtained at least 28 days after the first dose for students attending postsecondary schools, health care workers, or international travelers. People who received inactivated measles vaccine or an unknown type of measles vaccine during 1963-1967 should receive 2 doses of MMR vaccine. People who received inactivated mumps vaccine or an unknown type of mumps vaccine before 1979 and are at high risk for mumps infection should consider immunization with 2 doses of MMR vaccine. For females of childbearing age, rubella immunity should be determined. If there is no evidence of immunity, females who are not pregnant should be vaccinated. If there is no evidence of immunity, females who are pregnant should delay immunization until after pregnancy. Unvaccinated health care workers born before 1957 who lack laboratory evidence of measles, mumps, or rubella immunity or laboratory confirmation of disease should consider measles and mumps immunization with 2 doses of MMR vaccine or rubella immunization with 1 dose of MMR vaccine.  Pneumococcal 13-valent conjugate (PCV13) vaccine. When indicated, a person who is uncertain of his immunization history and has no record of immunization should receive the PCV13 vaccine. All adults 65 years of age and older should receive this  vaccine. An adult aged 19 years or older who has certain medical conditions and has not been previously immunized should receive 1 dose of PCV13 vaccine. This PCV13 should be followed with a dose of pneumococcal polysaccharide (PPSV23) vaccine. Adults who are at high risk for pneumococcal disease should obtain the PPSV23 vaccine at least 8 weeks after the dose of PCV13 vaccine. Adults older than 46 years of age who have normal immune system function should obtain the PPSV23 vaccine dose at least 1 year after the dose of PCV13 vaccine.  Pneumococcal polysaccharide (PPSV23) vaccine. When PCV13 is also indicated, PCV13 should be obtained first. All adults aged 65 years and older should be immunized. An adult younger than age 65 years who has certain medical conditions should be immunized. Any person who resides in a nursing home or long-term care facility should be immunized. An adult smoker should be immunized. People with an immunocompromised condition and certain other conditions should receive both PCV13 and PPSV23 vaccines. People with human immunodeficiency virus (HIV) infection should be immunized as soon as possible after diagnosis. Immunization during chemotherapy or radiation therapy should be avoided. Routine use of PPSV23 vaccine is not recommended for American Indians, [place] Natives, or people younger than 65 years unless there are medical conditions that require PPSV23 vaccine. When indicated, people who have unknown immunization and have no record of immunization should receive PPSV23 vaccine. One-time revaccination 5 years after the first dose of PPSV23 is recommended for people aged 19-64 years who have chronic kidney failure, nephrotic syndrome, asplenia, or immunocompromised conditions. People who received 1-2 doses of PPSV23 before age 65 years should receive another dose of PPSV23 vaccine at age 65 years or later if at least 5 years have passed since the previous dose. Doses of PPSV23 are not  needed for people immunized with PPSV23 at or after age 65 years.  Meningococcal vaccine. Adults with asplenia or persistent complement component deficiencies should receive 2 doses of quadrivalent meningococcal conjugate (MenACWY-D) vaccine. The doses should be obtained   at least 2 months apart. Microbiologists working with certain meningococcal bacteria, [place] recruits, people at risk during an outbreak, and people who travel to or live in countries with a high rate of meningitis should be immunized. A first-year college student up through age 34 years who is living in a residence hall should receive a dose if she did not receive a dose on or after her 16th birthday. Adults who have certain high-risk conditions should receive one or more doses of vaccine.  Hepatitis A vaccine. Adults who wish to be protected from this disease, have certain high-risk conditions, work with hepatitis A-infected animals, work in hepatitis A research labs, or travel to or work in countries with a high rate of hepatitis A should be immunized. Adults who were previously unvaccinated and who anticipate close contact with an international adoptee during the first 60 days after arrival in the [place] from a country with a high rate of hepatitis A should be immunized.  Hepatitis B vaccine. Adults who wish to be protected from this disease, have certain high-risk conditions, may be exposed to blood or other infectious body fluids, are household contacts or sex partners of hepatitis B positive people, are clients or workers in certain care facilities, or travel to or work in countries with a high rate of hepatitis B should be immunized.  Haemophilus influenzae type b (Hib) vaccine. A previously unvaccinated person with asplenia or sickle cell disease or having a scheduled splenectomy should receive 1 dose of Hib vaccine. Regardless of previous immunization, a recipient of a hematopoietic stem cell transplant should receive a  3-dose series 6-12 months after her successful transplant. Hib vaccine is not recommended for adults with HIV infection. Preventive Services / Frequency Ages 35 to 4 years  Blood pressure check.** / Every 3-5 years.  Lipid and cholesterol check.** / Every 5 years beginning at age 60.  Clinical breast exam.** / Every 3 years for women in their 71s and 10s.  BRCA-related cancer risk assessment.** / For women who have family members with a BRCA-related cancer (breast, ovarian, tubal, or peritoneal cancers).  Pap test.** / Every 2 years from ages 76 through 26. Every 3 years starting at age 61 through age 76 or 93 with a history of 3 consecutive normal Pap tests.  HPV screening.** / Every 3 years from ages 37 through ages 60 to 51 with a history of 3 consecutive normal Pap tests.  Hepatitis C blood test.** / For any individual with known risks for hepatitis C.  Skin self-exam. / Monthly.  Influenza vaccine. / Every year.  Tetanus, diphtheria, and acellular pertussis (Tdap, Td) vaccine.** / Consult your health care provider. Pregnant women should receive 1 dose of Tdap vaccine during each pregnancy. 1 dose of Td every 10 years.  Varicella vaccine.** / Consult your health care provider. Pregnant females who do not have evidence of immunity should receive the first dose after pregnancy.  HPV vaccine. / 3 doses over 6 months, if 93 and younger. The vaccine is not recommended for use in pregnant females. However, pregnancy testing is not needed before receiving a dose.  Measles, mumps, rubella (MMR) vaccine.** / You need at least 1 dose of MMR if you were born in 1957 or later. You may also need a 2nd dose. For females of childbearing age, rubella immunity should be determined. If there is no evidence of immunity, females who are not pregnant should be vaccinated. If there is no evidence of immunity, females who are  pregnant should delay immunization until after pregnancy.  Pneumococcal  13-valent conjugate (PCV13) vaccine.** / Consult your health care provider.  Pneumococcal polysaccharide (PPSV23) vaccine.** / 1 to 2 doses if you smoke cigarettes or if you have certain conditions.  Meningococcal vaccine.** / 1 dose if you are age 68 to 8 years and a Market researcher living in a residence hall, or have one of several medical conditions, you need to get vaccinated against meningococcal disease. You may also need additional booster doses.  Hepatitis A vaccine.** / Consult your health care provider.  Hepatitis B vaccine.** / Consult your health care provider.  Haemophilus influenzae type b (Hib) vaccine.** / Consult your health care provider. Ages 7 to 53 years  Blood pressure check.** / Every year.  Lipid and cholesterol check.** / Every 5 years beginning at age 25 years.  Lung cancer screening. / Every year if you are aged 11-80 years and have a 30-pack-year history of smoking and currently smoke or have quit within the past 15 years. Yearly screening is stopped once you have quit smoking for at least 15 years or develop a health problem that would prevent you from having lung cancer treatment.  Clinical breast exam.** / Every year after age 48 years.  BRCA-related cancer risk assessment.** / For women who have family members with a BRCA-related cancer (breast, ovarian, tubal, or peritoneal cancers).  Mammogram.** / Every year beginning at age 41 years and continuing for as long as you are in good health. Consult with your health care provider.  Pap test.** / Every 3 years starting at age 65 years through age 37 or 70 years with a history of 3 consecutive normal Pap tests.  HPV screening.** / Every 3 years from ages 72 years through ages 60 to 40 years with a history of 3 consecutive normal Pap tests.  Fecal occult blood test (FOBT) of stool. / Every year beginning at age 21 years and continuing until age 5 years. You may not need to do this test if you get  a colonoscopy every 10 years.  Flexible sigmoidoscopy or colonoscopy.** / Every 5 years for a flexible sigmoidoscopy or every 10 years for a colonoscopy beginning at age 35 years and continuing until age 48 years.  Hepatitis C blood test.** / For all people born from [birth year] through 1965 and any individual with known risks for hepatitis C.  Skin self-exam. / Monthly.  Influenza vaccine. / Every year.  Tetanus, diphtheria, and acellular pertussis (Tdap/Td) vaccine.** / Consult your health care provider. Pregnant women should receive 1 dose of Tdap vaccine during each pregnancy. 1 dose of Td every 10 years.  Varicella vaccine.** / Consult your health care provider. Pregnant females who do not have evidence of immunity should receive the first dose after pregnancy.  Zoster vaccine.** / 1 dose for adults aged 30 years or older.  Measles, mumps, rubella (MMR) vaccine.** / You need at least 1 dose of MMR if you were born in 1957 or later. You may also need a second dose. For females of childbearing age, rubella immunity should be determined. If there is no evidence of immunity, females who are not pregnant should be vaccinated. If there is no evidence of immunity, females who are pregnant should delay immunization until after pregnancy.  Pneumococcal 13-valent conjugate (PCV13) vaccine.** / Consult your health care provider.  Pneumococcal polysaccharide (PPSV23) vaccine.** / 1 to 2 doses if you smoke cigarettes or if you have certain conditions.  Meningococcal vaccine.** /  Consult your health care provider.  Hepatitis A vaccine.** / Consult your health care provider.  Hepatitis B vaccine.** / Consult your health care provider.  Haemophilus influenzae type b (Hib) vaccine.** / Consult your health care provider. Ages 64 years and over  Blood pressure check.** / Every year.  Lipid and cholesterol check.** / Every 5 years beginning at age 23 years.  Lung cancer screening. / Every year if you  are aged 16-80 years and have a 30-pack-year history of smoking and currently smoke or have quit within the past 15 years. Yearly screening is stopped once you have quit smoking for at least 15 years or develop a health problem that would prevent you from having lung cancer treatment.  Clinical breast exam.** / Every year after age 74 years.  BRCA-related cancer risk assessment.** / For women who have family members with a BRCA-related cancer (breast, ovarian, tubal, or peritoneal cancers).  Mammogram.** / Every year beginning at age 44 years and continuing for as long as you are in good health. Consult with your health care provider.  Pap test.** / Every 3 years starting at age 58 years through age 22 or 39 years with 3 consecutive normal Pap tests. Testing can be stopped between 65 and 70 years with 3 consecutive normal Pap tests and no abnormal Pap or HPV tests in the past 10 years.  HPV screening.** / Every 3 years from ages 64 years through ages 70 or 61 years with a history of 3 consecutive normal Pap tests. Testing can be stopped between 65 and 70 years with 3 consecutive normal Pap tests and no abnormal Pap or HPV tests in the past 10 years.  Fecal occult blood test (FOBT) of stool. / Every year beginning at age 40 years and continuing until age 27 years. You may not need to do this test if you get a colonoscopy every 10 years.  Flexible sigmoidoscopy or colonoscopy.** / Every 5 years for a flexible sigmoidoscopy or every 10 years for a colonoscopy beginning at age 7 years and continuing until age 32 years.  Hepatitis C blood test.** / For all people born from [birth year] through 1966 and any individual with known risks for hepatitis C.  Osteoporosis screening.** / A one-time screening for women ages 30 years and over and women at risk for fractures or osteoporosis.  Skin self-exam. / Monthly.  Influenza vaccine. / Every year.  Tetanus, diphtheria, and acellular pertussis (Tdap/Td)  vaccine.** / 1 dose of Td every 10 years.  Varicella vaccine.** / Consult your health care provider.  Zoster vaccine.** / 1 dose for adults aged 35 years or older.  Pneumococcal 13-valent conjugate (PCV13) vaccine.** / Consult your health care provider.  Pneumococcal polysaccharide (PPSV23) vaccine.** / 1 dose for all adults aged 46 years and older.  Meningococcal vaccine.** / Consult your health care provider.  Hepatitis A vaccine.** / Consult your health care provider.  Hepatitis B vaccine.** / Consult your health care provider.  Haemophilus influenzae type b (Hib) vaccine.** / Consult your health care provider. ** Family history and personal history of risk and conditions may change your health care provider's recommendations.   This information is not intended to replace advice given to you by your health care provider. Make sure you discuss any questions you have with your health care provider.   Document Released: 10/18/2001 Document Revised: 09/12/2014 Document Reviewed: 01/17/2011 Elsevier Interactive Patient Education [employer].

## 2016-01-21 NOTE — Progress Notes (Signed)
Pre visit review using our clinic review tool, if applicable. No additional management support is needed unless otherwise documented below in the visit note. 

## 2016-01-22 ENCOUNTER — Other Ambulatory Visit: Payer: Self-pay | Admitting: Internal Medicine

## 2016-01-22 DIAGNOSIS — E669 Obesity, unspecified: Secondary | ICD-10-CM | POA: Insufficient documentation

## 2016-01-22 DIAGNOSIS — E66811 Obesity, class 1: Secondary | ICD-10-CM | POA: Insufficient documentation

## 2016-01-24 ENCOUNTER — Encounter: Payer: Self-pay | Admitting: Internal Medicine

## 2016-02-03 ENCOUNTER — Ambulatory Visit
Admission: RE | Admit: 2016-02-03 | Discharge: 2016-02-03 | Disposition: A | Discharge status: Home / Self Care | Payer: 59 | Source: Ambulatory Visit | Attending: Internal Medicine | Admitting: Internal Medicine

## 2016-02-03 DIAGNOSIS — Z1231 Encounter for screening mammogram for malignant neoplasm of breast: Secondary | ICD-10-CM

## 2016-02-04 LAB — HM MAMMOGRAPHY

## 2016-02-04 NOTE — Addendum Note (Signed)
Addended by: JONES, THEtta GrandchildMAS L on: 02/04/2016 04:31 PM   Modules accepted: Kipp BroodSmartSet

## 2016-02-05 ENCOUNTER — Other Ambulatory Visit: Payer: Self-pay | Admitting: Internal Medicine

## 2016-02-05 ENCOUNTER — Telehealth: Payer: Self-pay | Admitting: Internal Medicine

## 2016-02-05 DIAGNOSIS — R928 Other abnormal and inconclusive findings on diagnostic imaging of breast: Secondary | ICD-10-CM

## 2016-02-05 NOTE — Telephone Encounter (Signed)
Please call pt today regarding the US of breast. And that her appt is 6/7 at 1:40 with 1:20 check in time.

## 2016-02-05 NOTE — Telephone Encounter (Signed)
Pt informed also informed of apt time as well

## 2016-02-10 ENCOUNTER — Ambulatory Visit
Admission: RE | Admit: 2016-02-10 | Discharge: 2016-02-10 | Disposition: A | Discharge status: Home / Self Care | Payer: 59 | Source: Ambulatory Visit | Attending: Internal Medicine | Admitting: Internal Medicine

## 2016-02-10 DIAGNOSIS — R928 Other abnormal and inconclusive findings on diagnostic imaging of breast: Secondary | ICD-10-CM

## 2016-02-10 LAB — HM MAMMOGRAPHY

## 2016-02-10 NOTE — Addendum Note (Signed)
Addended by: Etta GrandchildJONES, Giordano Getman L on: 02/10/2016 02:42 PM   Modules accepted: Kipp BroodSmartSet

## 2016-03-09 ENCOUNTER — Encounter: Payer: Self-pay | Admitting: Internal Medicine

## 2016-03-09 ENCOUNTER — Ambulatory Visit (INDEPENDENT_AMBULATORY_CARE_PROVIDER_SITE_OTHER): Payer: 59 | Admitting: Internal Medicine

## 2016-03-09 VITALS — BP 128/80 | HR 90 | Temp 99.2°F | Resp 20 | Wt 160.0 lb

## 2016-03-09 DIAGNOSIS — R062 Wheezing: Secondary | ICD-10-CM | POA: Insufficient documentation

## 2016-03-09 DIAGNOSIS — R05 Cough: Secondary | ICD-10-CM | POA: Insufficient documentation

## 2016-03-09 DIAGNOSIS — J45998 Other asthma: Secondary | ICD-10-CM

## 2016-03-09 DIAGNOSIS — R059 Cough, unspecified: Secondary | ICD-10-CM

## 2016-03-09 DIAGNOSIS — D869 Sarcoidosis, unspecified: Secondary | ICD-10-CM

## 2016-03-09 MED ORDER — PREDNISONE 10 MG PO TABS: ORAL_TABLET | ORAL | Status: DC

## 2016-03-09 MED ORDER — LEVOFLOXACIN 500 MG PO TABS
500.0000 mg | ORAL_TABLET | Freq: Every day | ORAL | Status: DC
Start: 2016-03-09 — End: 2016-08-09

## 2016-03-09 MED ORDER — HYDROCODONE-HOMATROPINE 5-1.5 MG/5ML PO SYRP: 5.0000 mL | ORAL_SOLUTION | Freq: Four times a day (QID) | ORAL | Status: DC | PRN

## 2016-03-09 NOTE — Patient Instructions (Signed)
Please take all new medication as prescribed - the antibiotic, cough medicine, and prednisone if wheezing becomes worse  Please continue all other medications as before, and refills have been done if requested.  Please have the pharmacy call with any other refills you may need.  Please keep your appointments with your specialists as you may have planned

## 2016-03-09 NOTE — Progress Notes (Signed)
   Subjective:    Patient ID: Nancy Villarreal, female    DOB: 19-May-1971, 45 y.o.   MRN: [id number]  HPI  Here with acute onset mild to mod 2-3 days ST, HA, general weakness and malaise, with prod cough greenish sputum, but Pt denies chest pain, increased sob or doe, wheezing, orthopnea, PND, increased LE swelling, palpitations, dizziness or syncope, except mild mid upper chest wheeziness heard with lying down the last 2 nights.  Temp has been up to 102.  Pt denies polydipsia, polyuria, Past Medical History  Diagnosis Date  . Abnormal chest x-ray   . Hemoptysis   . Unspecified asthma(493.90)   . 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 Anemia    Past Surgical History  Procedure Laterality Date  . Tubal ligation      1995  . Cesarean section    . Bronchoscopy  09-2008    Negative    reports that she quit smoking about 7 years ago. Her smoking use included Cigarettes. She has never used smokeless tobacco. She reports that she does not drink alcohol or use illicit drugs. family history includes Alcohol abuse in her paternal grandmother; Asthma in her paternal grandfather; Cancer in her paternal grandmother; Diabetes in her father; Early death in her paternal grandmother; Hyperlipidemia in her maternal grandmother and mother; Hypertension in her mother; Lung cancer in her maternal grandfather; Stroke in her maternal grandmother. No Known Allergies Current Outpatient Prescriptions on File Prior to Visit  Medication Sig Dispense Refill  . Fe Cbn-Fe Gluc-FA-B12-C-DSS (FERRALET 90) 90-1 MG TABS Take 1 tablet by mouth daily. 30 each 11   No current facility-administered medications on file prior to visit.    Review of Systems  Constitutional: Negative for unusual diaphoresis or night sweats HENT: Negative for ear swelling or discharge Eyes: Negative for worsening visual haziness  Respiratory: Negative for choking and stridor.   Gastrointestinal: Negative for distension or worsening  eructation Genitourinary: Negative for retention or change in urine volume.  Musculoskeletal: Negative for other MSK pain or swelling Skin: Negative for color change and worsening wound Neurological: Negative for tremors and numbness other than noted  Psychiatric/Behavioral: Negative for decreased concentration or agitation other than above       Objective:   Physical Exam BP 128/80 mmHg  Pulse 90  Temp(Src) 99.2 F (37.3 C) (Oral)  Resp 20  Wt 160 lb (72.576 kg)  SpO2 96% VS noted, mild ill Constitutional: Pt appears in no apparent distress HENT: Head: NCAT.  Right Ear: External ear normal.  Left Ear: External ear normal.  Bilat tm's with mild erythema.  Max sinus areas mild tender.  Pharynx with mild erythema, no exudate Eyes: . Pupils are equal, round, and reactive to light. Conjunctivae and EOM are normal Neck: Normal range of motion. Neck supple.  Cardiovascular: Normal rate and regular rhythm.   Pulmonary/Chest: Effort normal and breath sounds decreased without rales or wheezing.  Neurological: Pt is alert. Not confused , motor grossly intact Skin: Skin is warm. No rash, no LE edema Psychiatric: Pt behavior is normal. No agitation.   Rapid strep - neg    Assessment & Plan:

## 2016-03-09 NOTE — Assessment & Plan Note (Addendum)
Mild, ? Bronchial congested type, no wheeze by exam today, ok for mucinex otc prn, for prednisone only if asthma type wheezing starts

## 2016-03-09 NOTE — Progress Notes (Signed)
Pre visit review using our clinic review tool, if applicable. No additional management support is needed unless otherwise documented below in the visit note. 

## 2016-03-10 NOTE — Assessment & Plan Note (Signed)
No specific evidence on recurrence for now, reassured,  to f/u any worsening symptoms or concerns

## 2016-03-10 NOTE — Assessment & Plan Note (Signed)
C/w bronchitis vs pna, declines cxr for now, Mild to mod, for antibx course, otc cough med prn, to f/u any worsening symptoms or concerns

## 2016-03-10 NOTE — Assessment & Plan Note (Signed)
stable overall by history and exam, recent data reviewed with pt, and pt to continue medical treatment as before,  to f/u any worsening symptoms or concerns SpO2 Readings from Last 3 Encounters:  03/09/16 96%  01/21/16 98%  12/31/15 98%

## 2016-03-29 ENCOUNTER — Encounter: Payer: Self-pay | Admitting: Dietician

## 2016-03-29 ENCOUNTER — Encounter: Payer: 59 | Attending: Internal Medicine | Admitting: Dietician

## 2016-03-29 DIAGNOSIS — E669 Obesity, unspecified: Secondary | ICD-10-CM | POA: Diagnosis present

## 2016-03-29 DIAGNOSIS — Z713 Dietary counseling and surveillance: Secondary | ICD-10-CM | POA: Diagnosis not present

## 2016-03-29 NOTE — Progress Notes (Signed)
  Medical Nutrition Therapy:  Appt start time: 0810 end time:  0930.   Assessment:  Primary concerns today: Patient is here alone.  She states that she feels that she eats pretty well but is concerned about her cholesterol.  Cholesterol 231, Triglycerides:  109, HDL 77.5, LDL 132 01/21/16.  Hx includes:  Obesity, sarcoidosis and recurrent/recent pneumonia.  She is put on prednisone for this.  Referral is for obesity.  She overall follows a healthful diet.  Weight hx:   Lowest adult weight:  110 lbs early 20's Highest weight 250 lbs after birth of children. Lost with different diets then regained then started to change her lifestyle to keep it off.  She has gained about 10 lbs recently.  She has not gone through menopause.     Patient lives with her husband.  She does the shopping and cooking.  She works for [employer].  Preferred Learning Style:   No preference indicated   Learning Readiness:   Ready  Change in progress   MEDICATIONS: see list.  Takes prednisone when she is sick.   DIETARY INTAKE:  Avoided foods include soda, french fries, high fat hamburger.    24-hr recall:  B ( AM): cereal (Quaker oat square) with 2% milk, fruit  OR oatmeal, almonds, fruit Snk ( AM): almonds  L ( PM): grilled or often fried chicken sandwich Snk ( PM): chips D ( PM): gilled chicken or fish and vegetable Snk ( PM): fruit or cookies  Beverages: water  Usual physical activity: none, husband has gym at home:  Weights, ecliptical bike, bands, ball.  Enjoys walking  Estimated energy needs: 1400 calories 158 g carbohydrates 105 g protein 39 g fat  Progress Towards Goal(s):  In progress.   Nutritional Diagnosis:  NB-1.1 Food and nutrition-related knowledge deficit As related to balance of energy intake and expenditure.  As evidenced by diet hx/weight.    Intervention:  Nutrition counseling/education related to mindful eating and review of healthy eating.  Discussed importance of  increasing physical activity and portion control of specific foods. . Start an active lifestyle.  Find something that you enjoy most days.  Before a snack ask, "Am I hungry? Or eating for another reason?" Increase your intake of non starchy vegetables. Be mindful about the portion size of nuts. Be mindful about the choices that you are making when you are eating out.  Teaching Method Utilized:  Visual Auditory Hands on  Handouts given during visit include:  My plate  Label reading  Meal plan card  Barriers to learning/adherence to lifestyle change: none  Demonstrated degree of understanding via:  Teach Back   Monitoring/Evaluation:  Dietary intake, exercise, label reading, and body weight prn.

## 2016-03-29 NOTE — Patient Instructions (Addendum)
Start an active lifestyle.  Find something that you enjoy most days.  Before a snack ask, "Am I hungry? Or eating for another reason?" Increase your intake of non starchy vegetables. Be mindful about the portion size of nuts. Be mindful about the choices that you are making when you are eating out.

## 2016-08-09 ENCOUNTER — Encounter: Payer: Self-pay | Admitting: Internal Medicine

## 2016-08-09 ENCOUNTER — Ambulatory Visit (INDEPENDENT_AMBULATORY_CARE_PROVIDER_SITE_OTHER): Payer: 59 | Admitting: Internal Medicine

## 2016-08-09 VITALS — BP 120/76 | HR 74 | Resp 20 | Wt 181.0 lb

## 2016-08-09 DIAGNOSIS — R110 Nausea: Secondary | ICD-10-CM

## 2016-08-09 DIAGNOSIS — J45998 Other asthma: Secondary | ICD-10-CM | POA: Diagnosis not present

## 2016-08-09 DIAGNOSIS — R05 Cough: Secondary | ICD-10-CM | POA: Diagnosis not present

## 2016-08-09 DIAGNOSIS — R059 Cough, unspecified: Secondary | ICD-10-CM

## 2016-08-09 MED ORDER — ONDANSETRON HCL 4 MG PO TABS: 4.0000 mg | ORAL_TABLET | Freq: Three times a day (TID) | ORAL | 0 refills | Status: DC | PRN

## 2016-08-09 MED ORDER — AZITHROMYCIN 250 MG PO TABS: ORAL_TABLET | ORAL | 1 refills | Status: DC

## 2016-08-09 MED ORDER — RANITIDINE HCL 150 MG PO TABS: 150.0000 mg | ORAL_TABLET | Freq: Two times a day (BID) | ORAL | 5 refills | Status: DC

## 2016-08-09 NOTE — Progress Notes (Signed)
Pre visit review using our clinic review tool, if applicable. No additional management support is needed unless otherwise documented below in the visit note. 

## 2016-08-09 NOTE — Patient Instructions (Signed)
Please take all new medication as prescribed - the antibiotic, nausea medication, and zantac  Please continue all other medications as before, and refills have been done if requested.  Please have the pharmacy call with any other refills you may need.  Please keep your appointments with your specialists as you may have planned

## 2016-08-09 NOTE — Progress Notes (Signed)
   Subjective:    Patient ID: Nancy Villarreal, female    DOB: [birth year], 45 y.o.   MRN: [id number]  HPI  Here with acute onset mild to mod 2-3 days ST, HA, general weakness and malaise, with prod cough greenish sputum, and nausea with epigastric aching, but Pt denies chest pain, increased sob or doe, wheezing, orthopnea, PND, increased LE swelling, palpitations, dizziness or syncope.  Has had mild worsening reflux, but no other abd pain, dysphagia, n/v, bowel change or blood.   Pt denies polydipsia, polyuria, Past Medical History:  Diagnosis Date  . Abnormal chest x-ray   . Anemia   . Hemoptysis   . 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 Unspecified asthma(493.90)    Past Surgical History:  Procedure Laterality Date  . BRONCHOSCOPY  09-2008   Negative  . CESAREAN SECTION    . TUBAL LIGATION     1995    reports that she quit smoking about 8 years ago. Her smoking use included Cigarettes. She has never used smokeless tobacco. She reports that she does not drink alcohol or use drugs. family history includes Alcohol abuse in her paternal grandmother; Asthma in her paternal grandfather; Cancer in her paternal grandmother; Diabetes in her father; Early death in her paternal grandmother; Hyperlipidemia in her maternal grandmother and mother; Hypertension in her mother; Lung cancer in her maternal grandfather; Stroke in her maternal grandmother. Allergies  Allergen Reactions  . Hydrocodone Nausea And Vomiting   Current Outpatient Prescriptions on File Prior to Visit  Medication Sig Dispense Refill  . Fe Cbn-Fe Gluc-FA-B12-C-DSS (FERRALET 90) 90-1 MG TABS Take 1 tablet by mouth daily. 30 each 11   No current facility-administered medications on file prior to visit.    Review of Systems  Constitutional: Negative for unusual diaphoresis or night sweats HENT: Negative for ear swelling or discharge Eyes: Negative for worsening visual haziness  Respiratory: Negative for choking and  stridor.   Gastrointestinal: Negative for distension or worsening eructation Genitourinary: Negative for retention or change in urine volume.  Musculoskeletal: Negative for other MSK pain or swelling Skin: Negative for color change and worsening wound Neurological: Negative for tremors and numbness other than noted  Psychiatric/Behavioral: Negative for decreased concentration or agitation other than above   All other system neg per pt    Objective:   Physical Exam BP 120/76   Pulse 74   Resp 20   Wt 181 lb (82.1 kg)   SpO2 98%   BMI 32.06 kg/m  VS noted, mild ill Constitutional: Pt appears in no apparent distress HENT: Head: NCAT.  Right Ear: External ear normal.  Left Ear: External ear normal.  Eyes: . Pupils are equal, round, and reactive to light. Conjunctivae and EOM are normal Bilat tm's with mild erythema.  Max sinus areas non tender.  Pharynx with mild erythema, no exudate Neck: Normal range of motion. Neck supple.  Cardiovascular: Normal rate and regular rhythm.   Pulmonary/Chest: Effort normal and breath sounds without rales or wheezing.  Abd:  Soft, NT, ND, + BS Neurological: Pt is alert. Not confused , motor grossly intact Skin: Skin is warm. No rash, no LE edema Psychiatric: Pt behavior is normal. No agitation.      Assessment & Plan:

## 2016-08-11 ENCOUNTER — Ambulatory Visit: Payer: 59 | Admitting: Nurse Practitioner

## 2016-08-14 NOTE — Assessment & Plan Note (Signed)
Mild to mod, c/w bronchitis vs pna, declines cxr, for antibx course, to f/u any worsening symptoms or concerns  

## 2016-08-14 NOTE — Assessment & Plan Note (Signed)
stable overall by history and exam, recent data reviewed with pt, and pt to continue medical treatment as before,  to f/u any worsening symptoms or concerns @LASTSAO2(3)@  

## 2016-08-14 NOTE — Assessment & Plan Note (Signed)
likely due to dyspepsia vs current infectious illness, fr antiemetic prn,  to f/u any worsening symptoms or concerns

## 2016-09-24 ENCOUNTER — Ambulatory Visit (INDEPENDENT_AMBULATORY_CARE_PROVIDER_SITE_OTHER): Payer: 59 | Admitting: Family Medicine

## 2016-09-24 VITALS — BP 118/78 | HR 85 | Temp 100.1°F | Resp 16 | Ht 63.0 in | Wt 183.0 lb

## 2016-09-24 DIAGNOSIS — R509 Fever, unspecified: Secondary | ICD-10-CM

## 2016-09-24 DIAGNOSIS — R0789 Other chest pain: Secondary | ICD-10-CM | POA: Diagnosis not present

## 2016-09-24 DIAGNOSIS — D500 Iron deficiency anemia secondary to blood loss (chronic): Secondary | ICD-10-CM

## 2016-09-24 DIAGNOSIS — J208 Acute bronchitis due to other specified organisms: Secondary | ICD-10-CM

## 2016-09-24 DIAGNOSIS — D869 Sarcoidosis, unspecified: Secondary | ICD-10-CM | POA: Diagnosis not present

## 2016-09-24 LAB — POCT CBC
GRANULOCYTE PERCENT: 55.6 % (ref 37–80)
HCT, POC: 30.5 % — AB (ref 37.7–47.9)
HEMOGLOBIN: 10.5 g/dL — AB (ref 12.2–16.2)
Lymph, poc: 1.5 (ref 0.6–3.4)
MCH: 26.1 pg — AB (ref 27–31.2)
MCHC: 34.4 g/dL (ref 31.8–35.4)
MCV: 75.8 fL — AB (ref 80–97)
MID (CBC): 0.3 (ref 0–0.9)
MPV: 8.1 fL (ref 0–99.8)
POC Granulocyte: 2.3 (ref 2–6.9)
POC LYMPH PERCENT: 36.2 %L (ref 10–50)
POC MID %: 8.2 % (ref 0–12)
Platelet Count, POC: 133 10*3/uL — AB (ref 142–424)
RBC: 4.03 M/uL — AB (ref 4.04–5.48)
RDW, POC: 13.5 %
WBC: 4.2 10*3/uL — AB (ref 4.6–10.2)

## 2016-09-24 LAB — POCT SEDIMENTATION RATE: POCT SED RATE: 65 mm/hr — AB (ref 0–22)

## 2016-09-24 LAB — POCT INFLUENZA A/B
INFLUENZA B, POC: NEGATIVE
Influenza A, POC: NEGATIVE

## 2016-09-24 MED ORDER — ALBUTEROL SULFATE (2.5 MG/3ML) 0.083% IN NEBU
2.5000 mg | INHALATION_SOLUTION | Freq: Once | RESPIRATORY_TRACT | Status: AC
  Administered 2016-09-24: 2.5 mg via RESPIRATORY_TRACT

## 2016-09-24 MED ORDER — FERROUS FUM-IRON POLYSACCH 162-115.2 MG PO CAPS: 1.0000 | ORAL_CAPSULE | Freq: Every day | ORAL | 11 refills | Status: DC

## 2016-09-24 MED ORDER — ALBUTEROL SULFATE 108 (90 BASE) MCG/ACT IN AEPB: 2.0000 | INHALATION_SPRAY | RESPIRATORY_TRACT | 2 refills | Status: DC | PRN

## 2016-09-24 MED ORDER — GUAIFENESIN ER 1200 MG PO TB12
1.0000 | ORAL_TABLET | Freq: Two times a day (BID) | ORAL | 1 refills | Status: DC | PRN
Start: 2016-09-24 — End: 2016-09-30

## 2016-09-24 NOTE — Progress Notes (Signed)
Subjective:  By signing my name below, I, Nancy Villarreal, attest that this documentation has been prepared under the direction and in the presence of Norberto SorensonEva Dorothymae Maciver, Nancy Villarreal. Electronically Signed: Stann Oresung-Kai Villarreal, Scribe. 09/24/2016 , 1:45 PM .  Patient was seen in Room 10 .   Patient ID: Nancy Villarreal, female    DOB: 1971-08-01, 46 y.o.   MRN: [id number] Chief Complaint  Patient presents with  . Cough    w/wheezing  . Sore Throat  . Fever   HPI Nancy Villarreal is a 46 y.o. female who presents to [facility] at [facility] complaining of cough with slight wheezing, sore throat and low grade fever (Tmax 101) that started about 3 days ago. She states her throat started feeling sore 5 days ago. She was diagnosed with sarcoidosis in 2009, and had pneumonia in July 2017. Her symptoms have caused her to have some chest tightness and back pain. She's taken theraflu but stopped taking it because it inhibited her cough. She's only been taking ibuprofen now, twice a day, last taken yesterday. She also notes nasal congestion with some sinus pressure and pain. She denies shortness of breath, or coughing up congestion.   She was seen 6 weeks prior by her PCP for 2-3 days of acute illness with cough. She was put on zantac and zofran to decrease any dyspepsia contributing to cough and treated bronchitis versus pneumonia with zpak. She declined chest xray at that time.   [facility], Dr. Maple HudsonYoung. She has a distant smoking history and has been maintained on systemic prednisone after initial diagnosis 5 years prior. 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 2 years prior, Dr. Maple HudsonYoung determined she was in long-term remission with reactivation unlikely, no need to follow ace level and return to pulmonology as needed.   Past Medical History:  Diagnosis Date  . Abnormal chest x-ray   . Anemia   . Hemoptysis   . 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  Unspecified asthma(493.90)    Prior to Admission medications   Not on File   Allergies  Allergen Reactions  . Hydrocodone Nausea And Vomiting   Review of Systems  Constitutional: Positive for fatigue and fever. Negative for chills.  HENT: Positive for congestion, rhinorrhea and sore throat. Negative for sinus pain and sinus pressure.   Respiratory: Positive for cough and wheezing. Negative for shortness of breath.   Gastrointestinal: Negative for abdominal pain, diarrhea, nausea and vomiting.       Objective:   Physical Exam  Constitutional: She is oriented to person, place, and time. She appears well-developed and well-nourished. No distress.  HENT:  Head: Normocephalic and atraumatic.  Ears: canals impacted with cerumen bilaterally Nares: pale 3+ Tonsils  Eyes: EOM are normal. Pupils are equal, round, and reactive to light.  Neck: Neck supple.  No significant submandibular or anterior cervical adenopathy  Cardiovascular: Regular rhythm, S1 normal, S2 normal and normal heart sounds.  Tachycardia present.   No murmur heard. Pulmonary/Chest: Effort normal. No respiratory distress. She has decreased breath sounds (throughout ).  Musculoskeletal: Normal range of motion.  Neurological: She is alert and oriented to person, place, and time.  Skin: Skin is warm and dry.  Psychiatric: She has a normal mood and affect. Her behavior is normal.  Nursing note and vitals reviewed.   BP 118/78   Pulse 85   Temp 100.1 F (37.8 C) (Oral)  Resp 16   Ht 5\' 3"  (1.6 m)   Wt 183 lb (83 kg)   LMP 09/04/2016   SpO2 97%   BMI 32.42 kg/m    Results for orders placed or performed in visit on 09/24/16  POCT CBC  Result Value Ref Range   WBC 4.2 (A) 4.6 - 10.2 K/uL   Lymph, poc 1.5 0.6 - 3.4   POC LYMPH PERCENT 36.2 10 - 50 %L   MID (cbc) 0.3 0 - 0.9   POC MID % 8.2 0 - 12 %M   POC Granulocyte 2.3 2 - 6.9   Granulocyte percent 55.6 37 - 80 %G   RBC 4.03 (A) 4.04 - 5.48 M/uL    Hemoglobin 10.5 (A) 12.2 - 16.2 g/dL   HCT, POC 06.2 (A) 37.6 - 47.9 %   MCV 75.8 (A) 80 - 97 fL   MCH, POC 26.1 (A) 27 - 31.2 pg   MCHC 34.4 31.8 - 35.4 g/dL   RDW, POC 28.3 %   Platelet Count, POC 133 (A) 142 - 424 K/uL   MPV 8.1 0 - 99.8 fL  POCT Influenza A/B  Result Value Ref Range   Influenza A, POC Negative Negative   Influenza B, POC Negative Negative       Assessment & Plan:   1. Fever, unspecified fever cause   2. Chest tightness   3. Sarcoidosis (HCC)   4. Acute viral bronchitis   5. Iron deficiency anemia due to chronic blood loss     Orders Placed This Encounter  Procedures  . POCT CBC  . POCT SEDIMENTATION RATE  . POCT Influenza A/B    Meds ordered this encounter  Medications  . albuterol (PROVENTIL) (2.5 MG/3ML) 0.083% nebulizer solution 2.5 mg  . ferrous fumarate-iron polysaccharide complex (TANDEM) 162-115.2 MG CAPS capsule    Sig: Take 1 capsule by mouth daily with breakfast.    Dispense:  30 capsule    Refill:  11  . Albuterol Sulfate (PROAIR RESPICLICK) 108 (90 Base) MCG/ACT AEPB    Sig: Inhale 2 puffs into the lungs every 4 (four) hours as needed.    Dispense:  1 each    Refill:  2  . Guaifenesin (MUCINEX MAXIMUM STRENGTH) 1200 MG TB12    Sig: Take 1 tablet (1,200 mg total) by mouth every 12 (twelve) hours as needed.    Dispense:  14 tablet    Refill:  1    I personally performed the services described in this documentation, which was scribed in my presence. The recorded information has been reviewed and considered, and addended by me as needed.   Norberto Sorenson, M.D.  [facility] [address] [place], [place] [address] [phone] phone [phone] fax  10/04/16 10:30 PM

## 2016-09-24 NOTE — Patient Instructions (Addendum)
   IF you received an x-ray today, you will receive an invoice from [facility]. Please contact [facility] at [phone] with questions or concerns regarding your invoice.   IF you received labwork today, you will receive an invoice from [facility]. Please contact [facility] at [phone] with questions or concerns regarding your invoice.   Our billing staff will not be able to assist you with questions regarding bills from these companies.  You will be contacted with the lab results as soon as they are available. The fastest way to get your results is to activate your My Chart account. Instructions are located on the last page of this paperwork. If you have not heard from us regarding the results in 2 weeks, please contact this office.      Acute Bronchitis, Adult Acute bronchitis is sudden (acute) swelling of the air tubes (bronchi) in the lungs. Acute bronchitis causes these tubes to fill with mucus, which can make it hard to breathe. It can also cause coughing or wheezing. In adults, acute bronchitis usually goes away within 2 weeks. A cough caused by bronchitis may last up to 3 weeks. Smoking, allergies, and asthma can make the condition worse. Repeated episodes of bronchitis may cause further lung problems, such as chronic obstructive pulmonary disease (COPD). What are the causes? This condition can be caused by germs and by substances that irritate the lungs, including:  Cold and flu viruses. This condition is most often caused by the same virus that causes a cold.  Bacteria.  Exposure to tobacco smoke, dust, fumes, and air pollution.  What increases the risk? This condition is more likely to develop in people who:  Have close contact with someone with acute bronchitis.  Are exposed to lung irritants, such as tobacco smoke, dust, fumes, and vapors.  Have a weak immune system.  Have a respiratory condition such as asthma.  What are the signs or  symptoms? Symptoms of this condition include:  A cough.  Coughing up clear, yellow, or green mucus.  Wheezing.  Chest congestion.  Shortness of breath.  A fever.  Body aches.  Chills.  A sore throat.  How is this diagnosed? This condition is usually diagnosed with a physical exam. During the exam, your health care provider may order tests, such as chest X-rays, to rule out other conditions. He or she may also:  Test a sample of your mucus for bacterial infection.  Check the level of oxygen in your blood. This is done to check for pneumonia.  Do a chest X-ray or lung function testing to rule out pneumonia and other conditions.  Perform blood tests.  Your health care provider will also ask about your symptoms and medical history. How is this treated? Most cases of acute bronchitis clear up over time without treatment. Your health care provider may recommend:  Drinking more fluids. Drinking more makes your mucus thinner, which may make it easier to breathe.  Taking a medicine for a fever or cough.  Taking an antibiotic medicine.  Using an inhaler to help improve shortness of breath and to control a cough.  Using a cool mist vaporizer or humidifier to make it easier to breathe.  Follow these instructions at home: Medicines  Take over-the-counter and prescription medicines only as told by your health care provider.  If you were prescribed an antibiotic, take it as told by your health care provider. Do not stop taking the antibiotic even if you start to feel better. General instructions    Get plenty of rest.  Drink enough fluids to keep your urine clear or pale yellow.  Avoid smoking and secondhand smoke. Exposure to cigarette smoke or irritating chemicals will make bronchitis worse. If you smoke and you need help quitting, ask your health care provider. Quitting smoking will help your lungs heal faster.  Use an inhaler, cool mist vaporizer, or humidifier as told  by your health care provider.  Keep all follow-up visits as told by your health care provider. This is important. How is this prevented? To lower your risk of getting this condition again:  Wash your hands often with soap and water. If soap and water are not available, use hand sanitizer.  Avoid contact with people who have cold symptoms.  Try not to touch your hands to your mouth, nose, or eyes.  Make sure to get the flu shot every year.  Contact a health care provider if:  Your symptoms do not improve in 2 weeks of treatment. Get help right away if:  You cough up blood.  You have chest pain.  You have severe shortness of breath.  You become dehydrated.  You faint or keep feeling like you are going to faint.  You keep vomiting.  You have a severe headache.  Your fever or chills gets worse. This information is not intended to replace advice given to you by your health care provider. Make sure you discuss any questions you have with your health care provider. Document Released: 09/29/2004 Document Revised: 03/16/2016 Document Reviewed: 02/10/2016 Elsevier Interactive Patient Education  2017 Elsevier Inc.  

## 2016-09-27 ENCOUNTER — Ambulatory Visit (INDEPENDENT_AMBULATORY_CARE_PROVIDER_SITE_OTHER)
Admission: RE | Admit: 2016-09-27 | Discharge: 2016-09-27 | Disposition: A | Discharge status: Home / Self Care | Payer: 59 | Source: Ambulatory Visit | Attending: Internal Medicine | Admitting: Internal Medicine

## 2016-09-27 ENCOUNTER — Telehealth: Payer: Self-pay | Admitting: Internal Medicine

## 2016-09-27 DIAGNOSIS — R042 Hemoptysis: Secondary | ICD-10-CM

## 2016-09-27 NOTE — Telephone Encounter (Signed)
Ok to see next available provider- perhaps NP  Meanwhile recommend outpatient CXR for dx hemoptysis

## 2016-09-27 NOTE — Telephone Encounter (Signed)
Spoke with pt, aware of recs.  cxr ordered, rov with TP scheduled.  Nothing further needed.

## 2016-09-27 NOTE — Telephone Encounter (Signed)
Pt c/o single episode of hemoptysis- approx 1 teaspoon of bright red blood approx 10 minutes ago.  Pt has had increased shortness of breath X1 week- was given a nebulizer treatment by PCP on Saturday, and given proair respiclik.  Pt requesting appt to see CY as she has not been seen since 10/2014- does not feel comfortable waiting until next available appt with him.   Pt uses [facility] [address].    CY please advise.  Thanks!   Allergies  Allergen Reactions  . Hydrocodone Nausea And Vomiting   Current Outpatient Prescriptions on File Prior to Visit  Medication Sig Dispense Refill  . Albuterol Sulfate (PROAIR RESPICLICK) 108 (90 Base) MCG/ACT AEPB Inhale 2 puffs into the lungs every 4 (four) hours as needed. 1 each 2  . ferrous fumarate-iron polysaccharide complex (TANDEM) 162-115.2 MG CAPS capsule Take 1 capsule by mouth daily with breakfast. 30 capsule 11  . Guaifenesin (MUCINEX MAXIMUM STRENGTH) 1200 MG TB12 Take 1 tablet (1,200 mg total) by mouth every 12 (twelve) hours as needed. 14 tablet 1   No current facility-administered medications on file prior to visit.

## 2016-09-30 ENCOUNTER — Encounter: Payer: Self-pay | Admitting: Adult Health

## 2016-09-30 ENCOUNTER — Ambulatory Visit (INDEPENDENT_AMBULATORY_CARE_PROVIDER_SITE_OTHER): Payer: 59 | Admitting: Adult Health

## 2016-09-30 ENCOUNTER — Other Ambulatory Visit: Payer: 59

## 2016-09-30 VITALS — BP 116/68 | HR 74 | Temp 99.4°F | Ht 63.0 in | Wt 180.0 lb

## 2016-09-30 DIAGNOSIS — D869 Sarcoidosis, unspecified: Secondary | ICD-10-CM | POA: Diagnosis not present

## 2016-09-30 MED ORDER — AMOXICILLIN-POT CLAVULANATE 875-125 MG PO TABS: 1.0000 | ORAL_TABLET | Freq: Two times a day (BID) | ORAL | 0 refills | Status: AC

## 2016-09-30 MED ORDER — PREDNISONE 10 MG PO TABS: ORAL_TABLET | ORAL | 0 refills | Status: DC

## 2016-09-30 NOTE — Patient Instructions (Addendum)
Augmentin 875mg  Twice daily  For 1 week , take w/ food.  Mucinex DM Twice daily  As needed  Cough.  Prednisone taper over next week.  Labs today .  Follow up with Dr. Maple HudsonYoung or NP Tarius Stangelo  In 3 weeks and As needed follow up .with Dr. Maple HudsonYoung  In 3 month and As needed     Please contact office for sooner follow up if symptoms do not improve or worsen or seek emergency care

## 2016-09-30 NOTE — Assessment & Plan Note (Addendum)
?   Flare w/ bronchiits  Check ACE  Steroid trial  Consider PFT in 3 months.   Plan  Patient Instructions  Augmentin 875mg  Twice daily  For 1 week , take w/ food.  Mucinex DM Twice daily  As needed  Cough.  Prednisone taper over next week.  Labs today .  Follow up with Dr. Maple HudsonYoung or NP Jetaun Colbath  In 3 weeks and As needed follow up .with Dr. Maple HudsonYoung  In 3 month and As needed     Please contact office for sooner follow up if symptoms do not improve or worsen or seek emergency care

## 2016-09-30 NOTE — Progress Notes (Signed)
 @Patient  ID: Dimple NanasFelicia D Noel, female    DOB: 04/21/1971, 46 y.o.   MRN: [id number]  Chief Complaint  Patient presents with  . Acute Visit    sarcoid /hemoptysis     Referring provider: Etta GrandchildJones, Thomas L, MD  HPI: 46 year old female former smoker followed for Sarcoid , Asthma /AR   TEST  Bronch NEG 09/24/08- dx based on mediastinal adenopathy/ interstitial disease, ACE 90, response to steroids   09/30/2016 Acute OV : Sarcoid /Hemoptysis  She presents for an acute office visit. She complains of 10 days of cough, congestion with yellow mucus, fever and wheezing . Feels run down. She did see some blood tinged mucus intiially.   He denies any aspirin use. She was seen by her primary care physician and given mucinex and proair.  Chest x-ray done on 09/27/2016 showed stable diffuse fibrotic changes consistent with underlying known sarcoidosis.  Last seen in 10/2014. , says doing okay except gets bronchitis 1-2 x yr.    Allergies  Allergen Reactions  . Hydrocodone Nausea And Vomiting    Immunization History  Administered Date(s) Administered  . Influenza Split 06/12/2012  . Influenza Whole 05/04/2011  . Influenza,inj,Quad PF,36+ Mos 07/16/2013, 06/16/2014  . Pneumococcal Polysaccharide-23 05/04/2011  . Tdap 03/07/2012    Past Medical History:  Diagnosis Date  . Abnormal chest x-ray   . Anemia   . Hemoptysis   . 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 Unspecified asthma(493.90)     Tobacco History: History  Smoking Status  . Former Smoker  . Types: Cigarettes  . Quit date: 08/05/2008  Smokeless Tobacco  . Never Used   Counseling given: Not Answered   Outpatient Encounter Prescriptions as of 09/30/2016  Medication Sig  . Albuterol Sulfate (PROAIR RESPICLICK) 108 (90 Base) MCG/ACT AEPB Inhale 2 puffs into the lungs every 4 (four) hours as needed.  . ferrous fumarate-iron polysaccharide complex (TANDEM) 162-115.2 MG CAPS capsule Take 1 capsule by mouth daily  with breakfast.  . amoxicillin-clavulanate (AUGMENTIN) 875-125 MG tablet Take 1 tablet by mouth 2 (two) times daily.  . predniSONE (DELTASONE) 10 MG tablet 4 tabs for 2 days, then 3 tabs for 2 days, 2 tabs for 2 days, then 1 tab for 2 days, then stop  . [DISCONTINUED] Guaifenesin (MUCINEX MAXIMUM STRENGTH) 1200 MG TB12 Take 1 tablet (1,200 mg total) by mouth every 12 (twelve) hours as needed. (Patient not taking: Reported on 09/30/2016)   No facility-administered encounter medications on file as of 09/30/2016.      Review of Systems  Constitutional:   No  weight loss, night sweats,  Fevers, chills, fatigue, or  lassitude.  HEENT:   No headaches,  Difficulty swallowing,  Tooth/dental problems, or  Sore throat,                No sneezing, itching, ear ache,  +nasal congestion, post nasal drip,   CV:  No chest pain,  Orthopnea, PND, swelling in lower extremities, anasarca, dizziness, palpitations, syncope.   GI  No heartburn, indigestion, abdominal pain, nausea, vomiting, diarrhea, change in bowel habits, loss of appetite, bloody stools.   Resp:  .  No chest wall deformity  Skin: no rash or lesions.  GU: no dysuria, change in color of urine, no urgency or frequency.  No flank pain, no hematuria   MS:  No joint pain or swelling.  No decreased range of motion.  No back pain.    Physical Exam  BP  116/68 (BP Location: Left Arm, Cuff Size: Normal)   Pulse 74   Temp 99.4 F (37.4 C) (Oral)   Ht 5\' 3"  (1.6 m)   Wt 180 lb (81.6 kg)   LMP 09/04/2016   SpO2 100%   BMI 31.89 kg/m   GEN: A/Ox3; pleasant , NAD, well nourished    HEENT:  [place]/AT,  EACs-clear, TMs-wnl, NOSE-clear, THROAT-clear, no lesions, no postnasal drip or exudate noted.   NECK:  Supple w/ fair ROM; no JVD; normal carotid impulses w/o bruits; no thyromegaly or nodules palpated; no lymphadenopathy.    RESP  Clear  P & A; w/o, wheezes/ rales/ or rhonchi. no accessory muscle use, no dullness to percussion  CARD:  RRR,  no m/r/g, no peripheral edema, pulses intact, no cyanosis or clubbing.  GI:   Soft & nt; nml bowel sounds; no organomegaly or masses detected.   Musco: Warm bil, no deformities or joint swelling noted.   Neuro: alert, no focal deficits noted.    Skin: Warm, no lesions or rashes  Psych:  No change in mood or affect. No depression or anxiety.  No memory loss.  Lab Results Imaging: Dg Chest 2 View  Result Date: 09/27/2016 CLINICAL DATA:  Cough and 3 episodes of hemoptysis today. History of sarcoidosis, asthma, former smoker. EXAM: CHEST  2 VIEW COMPARISON:  PA and lateral chest x-ray of Jan 15, 2014 FINDINGS: The lungs remain well-expanded. The interstitial markings remain increased diffusely. There is no alveolar infiltrate or pleural effusion. No mediastinal nor hilar lymphadenopathy is observed. The heart and pulmonary vascularity are normal. The trachea is midline. The bony thorax exhibits no acute abnormality. IMPRESSION: Stable diffuse fibrotic changes bilaterally consistent with known sarcoidosis. No superimposed pneumonia, CHF, nor other acute cardiopulmonary abnormality. Electronically Signed   By: David  Swazil[place] M.D.   On: 09/27/2016 16:21     Assessment & Plan:   Sarcoidosis (HCC) ? Flare w/ bronchiits  Check ACE  Steroid trial  Consider PFT in 3 months.   Plan  Patient Instructions  Augmentin 875mg  Twice daily  For 1 week , take w/ food.  Mucinex DM Twice daily  As needed  Cough.  Prednisone taper over next week.  Labs today .  Follow up with Dr. Maple Hudson or NP Cydne Grahn  In 3 weeks and As needed follow up .with Dr. Maple Hudson  In 3 month and As needed     Please contact office for sooner follow up if symptoms do not improve or worsen or seek emergency care         Rubye Oaks, NP 09/30/2016

## 2016-10-03 LAB — ANGIOTENSIN CONVERTING ENZYME: ANGIOTENSIN-CONVERTING ENZYME: 86 U/L — AB (ref 9–67)

## 2016-10-05 ENCOUNTER — Telehealth: Payer: Self-pay | Admitting: Adult Health

## 2016-10-05 NOTE — Telephone Encounter (Signed)
Spoke with pt. States that she is having an allergic reaction. Reports that she is having right hand swelling, her hand at times is turning blue. On 09/30/16, TP gave her Augmentin and Prednisone. These meds were started on 10/01/16 and the swelling started on 10/04/16. Pt states that she has taken prednisone in the past but not Augmentin.  RA - please advise as TP is not available this afternoon. Thanks.

## 2016-10-05 NOTE — Telephone Encounter (Signed)
Stop taking Augmentin Continue prednisone Pl direct to  Dr. Maple HudsonYoung

## 2016-10-05 NOTE — Telephone Encounter (Signed)
Pt aware of CY's recommendations. Pt states her PCP is hard to get an apt with, and her immune system to too low to go sat in a urgent care. Pt states "she will just deal with it, and sat at work with a swollen hand and pray that she makes it home and awakes from sleep tonight".  Nothing further needed.  Will send to CY as a FYI.

## 2016-10-05 NOTE — Telephone Encounter (Signed)
CY  Please Advise- please see previous messages  Pt. Understands RA message, but she still wants to know what should she do as far as her hand goes, says the swelling has not been getting better at all

## 2016-10-05 NOTE — Telephone Encounter (Signed)
This is not likely to be an allergic reaction to augmentin. She should get looked at by her PCP or an Urgent Care to see what is going on.

## 2016-10-06 ENCOUNTER — Ambulatory Visit (INDEPENDENT_AMBULATORY_CARE_PROVIDER_SITE_OTHER): Payer: 59 | Admitting: Physician Assistant

## 2016-10-06 ENCOUNTER — Ambulatory Visit (INDEPENDENT_AMBULATORY_CARE_PROVIDER_SITE_OTHER): Payer: 59

## 2016-10-06 VITALS — BP 110/84 | HR 104 | Temp 99.0°F | Resp 16 | Ht 63.0 in | Wt 179.6 lb

## 2016-10-06 DIAGNOSIS — M7989 Other specified soft tissue disorders: Secondary | ICD-10-CM

## 2016-10-06 DIAGNOSIS — L539 Erythematous condition, unspecified: Secondary | ICD-10-CM

## 2016-10-06 DIAGNOSIS — R509 Fever, unspecified: Secondary | ICD-10-CM

## 2016-10-06 LAB — POCT CBC
Granulocyte percent: 68.2 %G (ref 37–80)
HEMATOCRIT: 34.4 % — AB (ref 37.7–47.9)
HEMOGLOBIN: 11.5 g/dL — AB (ref 12.2–16.2)
Lymph, poc: 2.7 (ref 0.6–3.4)
MCH, POC: 26.2 pg — AB (ref 27–31.2)
MCHC: 33.4 g/dL (ref 31.8–35.4)
MCV: 78.5 fL — AB (ref 80–97)
MID (CBC): 0.4 (ref 0–0.9)
MPV: 8.1 fL (ref 0–99.8)
POC GRANULOCYTE: 6.8 (ref 2–6.9)
POC LYMPH PERCENT: 27.7 %L (ref 10–50)
POC MID %: 4.1 % (ref 0–12)
Platelet Count, POC: 174 10*3/uL (ref 142–424)
RBC: 4.38 M/uL (ref 4.04–5.48)
RDW, POC: 13.5 %
WBC: 9.9 10*3/uL (ref 4.6–10.2)

## 2016-10-06 MED ORDER — NAPROXEN 500 MG PO TABS: 500.0000 mg | ORAL_TABLET | Freq: Two times a day (BID) | ORAL | 0 refills | Status: DC

## 2016-10-06 MED ORDER — DOXYCYCLINE HYCLATE 100 MG PO CAPS: 100.0000 mg | ORAL_CAPSULE | Freq: Two times a day (BID) | ORAL | 0 refills | Status: AC

## 2016-10-06 NOTE — Progress Notes (Signed)
[facility] [address] [place] [address] [phone]  Date:  10/06/2016   Name:  Nancy Villarreal   DOB:  1970/10/11   MRN:  [id number]  PCP:  Nancy Linger, MD    History of Present Illness:  Nancy Villarreal is a 46 y.o. female patient who presents to [facility] for cc of swollen right hand.    Patient is here with swollen hand that started 2 days ago and has progressively worsened.  The swelling is at the 2nd and 3rd digits with redness and warm to the touch.  Pain is also present up the right elbow and arm and supraclavicular.  She feels heavy fatigue, and has been carrying a low grade fever for the past 6 weeks.   To note...she was seen by PCP for cough and fever that ranges from 99-101.9.  She was treated with augmentin and prednisone for about 8 days.  ACE elevated and sarcoidosis was determined to be active.  When the hand began to swell, they advised her to discontinue the medicine for fear for possible allergic reaction.  The hand continues to swell.  She continues to have low grade fevers, and fatigue.  She has noticed redness, and unable to use the hand.      Patient Active Problem List   Diagnosis Date Noted  . Nausea 08/09/2016  . Cough 03/09/2016  . Obesity (BMI 30-39.9) 01/22/2016  . Right foot pain 01/21/2016  . Routine general medical examination at a health care facility 12/11/2013  . Visit for screening mammogram 03/07/2012  . Anemia, iron deficiency 02/10/2012  . Sarcoidosis (HCC) 09/17/2008  . Asthma in remission 09/03/2008  . Allergic rhinitis due to pollen 09/03/2008    Past Medical History:  Diagnosis Date  . Abnormal chest x-ray   . Anemia   . Hemoptysis   . 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 Unspecified asthma(493.90)     Past Surgical History:  Procedure Laterality Date  . BRONCHOSCOPY  09-2008   Negative  . CESAREAN SECTION    . TUBAL LIGATION     1995    Social History  Substance Use Topics  . Smoking  status: Former Smoker    Types: Cigarettes    Quit date: 08/05/2008  . Smokeless tobacco: Never Used  . Alcohol use No    Family History  Problem Relation Age of Onset  . Asthma Paternal Grandfather   . Lung cancer Maternal Grandfather   . Hyperlipidemia Mother   . Hypertension Mother   . Diabetes Father   . Hyperlipidemia Maternal Grandmother   . Stroke Maternal Grandmother   . Cancer Paternal Grandmother   . Alcohol abuse Paternal Grandmother   . Early death Paternal Grandmother     Allergies  Allergen Reactions  . Hydrocodone Nausea And Vomiting    Medication list has been reviewed and updated.  Current Outpatient Prescriptions on File Prior to Visit  Medication Sig Dispense Refill  . Albuterol Sulfate (PROAIR RESPICLICK) 108 (90 Base) MCG/ACT AEPB Inhale 2 puffs into the lungs every 4 (four) hours as needed. 1 each 2  . ferrous fumarate-iron polysaccharide complex (TANDEM) 162-115.2 MG CAPS capsule Take 1 capsule by mouth daily with breakfast. 30 capsule 11  . predniSONE (DELTASONE) 10 MG tablet 4 tabs for 2 days, then 3 tabs for 2 days, 2 tabs for 2 days, then 1 tab for 2 days, then stop 20 tablet 0  .  amoxicillin-clavulanate (AUGMENTIN) 875-125 MG tablet Take 1 tablet by mouth 2 (two) times daily. (Patient not taking: Reported on 10/06/2016) 14 tablet 0   No current facility-administered medications on file prior to visit.     ROS ROS otherwise unremarkable unless listed above.   Physical Examination: BP 110/84   Pulse (!) 104   Temp 99 F (37.2 C) (Oral)   Resp 16   Ht 5\' 3"  (1.6 m)   Wt 179 lb 9.6 oz (81.5 kg)   LMP 09/04/2016   SpO2 99%   BMI 31.81 kg/m  Ideal Body Weight: Weight in (lb) to have BMI = 25: 140.8  Physical Exam  Constitutional: She is oriented to person, place, and time. She appears well-developed and well-nourished. No distress.  HENT:  Head: Normocephalic and atraumatic.  Right Ear: External ear normal.  Left Ear: External ear normal.   Eyes: Conjunctivae and EOM are normal. Pupils are equal, round, and reactive to light.  Cardiovascular: Normal rate.   Pulmonary/Chest: Effort normal. No respiratory distress. She has no decreased breath sounds. She has no wheezes. She has no rhonchi.  Musculoskeletal:  Right hand with 2nd mcp erythema and swelling.  Erythema localized at the mcp, swelling extends up the 2nd digit.  Faint swelling along 3rd mcp.  Exquisitely tender to the touch.  No passive or active rom secondary to pain.  Tiny pinpoint papule possibly mole at the dorsum of the mcp.  Tender along the forearm with light and hard touch.  No red streaking or adenopathy.  Tender supraclavicular however no adenopathy detected.   Neurological: She is alert and oriented to person, place, and time.  Skin: She is not diaphoretic.  Psychiatric: She has a normal mood and affect. Her behavior is normal.   Results for orders placed or performed in visit on 10/06/16  POCT CBC  Result Value Ref Range   WBC 9.9 4.6 - 10.2 K/uL   Lymph, poc 2.7 0.6 - 3.4   POC LYMPH PERCENT 27.7 10 - 50 %L   MID (cbc) 0.4 0 - 0.9   POC MID % 4.1 0 - 12 %M   POC Granulocyte 6.8 2 - 6.9   Granulocyte percent 68.2 37 - 80 %G   RBC 4.38 4.04 - 5.48 M/uL   Hemoglobin 11.5 (A) 12.2 - 16.2 g/dL   HCT, POC 78.234.4 (A) 95.637.7 - 47.9 %   MCV 78.5 (A) 80 - 97 fL   MCH, POC 26.2 (A) 27 - 31.2 pg   MCHC 33.4 31.8 - 35.4 g/dL   RDW, POC 21.313.5 %   Platelet Count, POC 174 142 - 424 K/uL   MPV 8.1 0 - 99.8 fL   Dg Chest 2 View  Result Date: 09/27/2016 CLINICAL DATA:  Cough and 3 episodes of hemoptysis today. History of sarcoidosis, asthma, former smoker. EXAM: CHEST  2 VIEW COMPARISON:  PA and lateral chest x-ray of Jan 15, 2014 FINDINGS: The lungs remain well-expanded. The interstitial markings remain increased diffusely. There is no alveolar infiltrate or pleural effusion. No mediastinal nor hilar lymphadenopathy is observed. The heart and pulmonary vascularity are  normal. The trachea is midline. The bony thorax exhibits no acute abnormality. IMPRESSION: Stable diffuse fibrotic changes bilaterally consistent with known sarcoidosis. No superimposed pneumonia, CHF, nor other acute cardiopulmonary abnormality. Electronically Signed   By: David  SwazilandJordan M.D.   On: 09/27/2016 16:21   Dg Hand Complete Right  Result Date: 10/06/2016 CLINICAL DATA:  Right hand pain.  No known  injury . EXAM: RIGHT HAND - COMPLETE 3+ VIEW COMPARISON:  No recent prior. FINDINGS: No acute bony or joint abnormality identified. No evidence of fracture dislocation. IMPRESSION: No acute abnormality. Electronically Signed   By: Maisie Fus  Register   On: 10/06/2016 08:54     Assessment and Plan: Nancy Villarreal is a 46 y.o. female who is here today for right hand swelling.  Possible gout, pseudogout, septic joint.  Obstruction of vessel (vena cava syndrome) unlikely given such localized swelling.  Manifestation of gout is unlikely given current prednisone use.   Advised to discontinue augmentin.  Changed to doxy and naproxen.  Hand orthopedist consult appreciated at this time and accepted. She is self-transporting to [facility].  Advised no hydration. Swelling of right hand - Plan: POCT CBC, Sedimentation Rate, DG Hand Complete Right, Basic metabolic panel, Ambulatory referral to Hand Surgery  Fever, unspecified fever cause - Plan: POCT CBC, Sedimentation Rate, Basic metabolic panel, Ambulatory referral to Hand Surgery  Erythema of hand - Plan: Ambulatory referral to Hand Surgery  Trena Platt, PA-C [facility] 2/1/201810:29 AM

## 2016-10-06 NOTE — Patient Instructions (Addendum)
  I am placing a referral to the hand doctor.  Please await this contact.  We need to just insure this is not a septic joint.   I am placing you on doxycycline.  Continue to stay off the augmentin.  Do not restart the prednisone as well.  I am giving you an anti-inflammatory.      IF you received an x-ray today, you will receive an invoice from [facility]. Please contact [facility] at [phone] with questions or concerns regarding your invoice.   IF you received labwork today, you will receive an invoice from [facility]. Please contact [facility] at [phone] with questions or concerns regarding your invoice.   Our billing staff will not be able to assist you with questions regarding bills from these companies.  You will be contacted with the lab results as soon as they are available. The fastest way to get your results is to activate your My Chart account. Instructions are located on the last page of this paperwork. If you have not heard from us regarding the results in 2 weeks, please contact this office.

## 2016-10-07 LAB — BASIC METABOLIC PANEL
BUN / CREAT RATIO: 17 (ref 9–23)
BUN: 13 mg/dL (ref 6–24)
CO2: 23 mmol/L (ref 18–29)
CREATININE: 0.75 mg/dL (ref 0.57–1.00)
Calcium: 8.9 mg/dL (ref 8.7–10.2)
Chloride: 100 mmol/L (ref 96–106)
GFR calc Af Amer: 111 mL/min/{1.73_m2} (ref >59)
GFR, EST NON AFRICAN AMERICAN: 96 mL/min/{1.73_m2} (ref >59)
GLUCOSE: 96 mg/dL (ref 65–99)
Potassium: 4 mmol/L (ref 3.5–5.2)
SODIUM: 136 mmol/L (ref 134–144)

## 2016-10-07 LAB — SEDIMENTATION RATE: Sed Rate: 12 mm/hr (ref 0–32)

## 2016-10-11 ENCOUNTER — Ambulatory Visit (INDEPENDENT_AMBULATORY_CARE_PROVIDER_SITE_OTHER): Payer: 59 | Admitting: Physician Assistant

## 2016-10-11 ENCOUNTER — Encounter: Payer: Self-pay | Admitting: Physician Assistant

## 2016-10-11 VITALS — BP 109/70 | HR 92 | Temp 98.6°F | Ht 63.0 in | Wt 178.6 lb

## 2016-10-11 DIAGNOSIS — R2231 Localized swelling, mass and lump, right upper limb: Secondary | ICD-10-CM | POA: Diagnosis not present

## 2016-10-11 MED ORDER — PREDNISONE 20 MG PO TABS: ORAL_TABLET | ORAL | 0 refills | Status: AC

## 2016-10-11 NOTE — Patient Instructions (Addendum)
Please hold off on the prednisone until I contact you with the wound culture result. Please continue the antibiotic, and anti-inflammatory.    IF you received an x-ray today, you will receive an invoice from [facility]. Please contact [facility] at [phone] with questions or concerns regarding your invoice.   IF you received labwork today, you will receive an invoice from [facility]. Please contact [facility] at [phone] with questions or concerns regarding your invoice.   Our billing staff will not be able to assist you with questions regarding bills from these companies.  You will be contacted with the lab results as soon as they are available. The fastest way to get your results is to activate your My Chart account. Instructions are located on the last page of this paperwork. If you have not heard from us regarding the results in 2 weeks, please contact this office.

## 2016-10-11 NOTE — Progress Notes (Signed)
[facility] [address], [place] [address] [phone]  Date:  10/11/2016   Name:  Nancy Villarreal   DOB:  12-09-1970   MRN:  [id number]  PCP:  Sanda Linger, MD    History of Present Illness:  Nancy Villarreal is a 46 y.o. female patient who presents to [facility] for follow up of the swelling of the right hand.  Patient was seen here 5 days ago for swelling of her right hand specifically found to have erythematous swelling of the 2nd mcp.  This was questionable joint sepsis after a course of antibiotics and prednisone from a present respiratory infection with sarcoidotic flare.  8 days into the augmentin/prednisone regimen, she had the swelling and came here 2 days following her hand symptoms.  She was sent to [facility], of hand surgery, where culture aspiration was done.  No pus drainage, but culture was obtained (result unknown).  I discontinued the augmentin/prednisone and was started on the NSAID and doxycycline that I had placed and returns today... Cbc and sed rate were unremarkable.  She reports that her swelling has improved to virtually no pain.  Better movement of this joint.  No fevers.  Mobility is returning but not baseline.  Bruising prominent.  No body aches.     Patient Active Problem List   Diagnosis Date Noted  . Nausea 08/09/2016  . Cough 03/09/2016  . Obesity (BMI 30-39.9) 01/22/2016  . Right foot pain 01/21/2016  . Routine general medical examination at a health care facility 12/11/2013  . Visit for screening mammogram 03/07/2012  . Anemia, iron deficiency 02/10/2012  . Sarcoidosis (HCC) 09/17/2008  . Asthma in remission 09/03/2008  . Allergic rhinitis due to pollen 09/03/2008    Past Medical History:  Diagnosis Date  . Abnormal chest x-ray   . Anemia   . Hemoptysis   . 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 Unspecified asthma(493.90)     Past Surgical History:  Procedure Laterality Date  . BRONCHOSCOPY  09-2008   Negative   . CESAREAN SECTION    . TUBAL LIGATION     1995    Social History  Substance Use Topics  . Smoking status: Former Smoker    Types: Cigarettes    Quit date: 08/05/2008  . Smokeless tobacco: Never Used  . Alcohol use No    Family History  Problem Relation Age of Onset  . Asthma Paternal Grandfather   . Lung cancer Maternal Grandfather   . Hyperlipidemia Mother   . Hypertension Mother   . Diabetes Father   . Hyperlipidemia Maternal Grandmother   . Stroke Maternal Grandmother   . Cancer Paternal Grandmother   . Alcohol abuse Paternal Grandmother   . Early death Paternal Grandmother     Allergies  Allergen Reactions  . Hydrocodone Nausea And Vomiting    Medication list has been reviewed and updated.  Current Outpatient Prescriptions on File Prior to Visit  Medication Sig Dispense Refill  . doxycycline (VIBRAMYCIN) 100 MG capsule Take 1 capsule (100 mg total) by mouth 2 (two) times daily. 20 capsule 0  . naproxen (NAPROSYN) 500 MG tablet Take 1 tablet (500 mg total) by mouth 2 (two) times daily with a meal. 20 tablet 0  . Albuterol Sulfate (PROAIR RESPICLICK) 108 (90 Base) MCG/ACT AEPB Inhale 2 puffs into the lungs every 4 (four) hours as needed. (Patient not taking: Reported on 10/11/2016) 1 each 2  . ferrous  fumarate-iron polysaccharide complex (TANDEM) 162-115.2 MG CAPS capsule Take 1 capsule by mouth daily with breakfast. (Patient not taking: Reported on 10/11/2016) 30 capsule 11  . predniSONE (DELTASONE) 10 MG tablet 4 tabs for 2 days, then 3 tabs for 2 days, 2 tabs for 2 days, then 1 tab for 2 days, then stop (Patient not taking: Reported on 10/11/2016) 20 tablet 0   No current facility-administered medications on file prior to visit.     ROS ROS otherwise unremarkable unless listed above.   Physical Examination: BP 109/70   Pulse 92   Temp 98.6 F (37 C) (Oral)   Ht 5\' 3"  (1.6 m)   Wt 178 lb 9.6 oz (81 kg)   LMP 10/07/2016   SpO2 100%   BMI 31.64 kg/m  Ideal  Body Weight: Weight in (lb) to have BMI = 25: 140.8  Physical Exam  Constitutional: She is oriented to person, place, and time. She appears well-developed and well-nourished. No distress.  HENT:  Head: Normocephalic and atraumatic.  Right Ear: External ear normal.  Left Ear: External ear normal.  Eyes: Conjunctivae and EOM are normal. Pupils are equal, round, and reactive to light.  Cardiovascular: Normal rate and regular rhythm.   Musculoskeletal:  Right hand with anterior 2nd mtc ecchymosis and bruising.  There continues to be mild swelling at this area that extends to neighboring 1st and 3rd.  No tenderness upon palpation.  Active movement present at all planes.    Neurological: She is alert and oriented to person, place, and time.  Skin: She is not diaphoretic.  Psychiatric: She has a normal mood and affect. Her behavior is normal.     Assessment and Plan: Dimple NanasFelicia D Noel is a 46 y.o. female who is here today for right hand swelling. --this appears to be improving.  Advised to continue dosing.  Contacted [facility] which did not have a culture, due to lack of any specimen.  This was not infected.  I will start a short prednisone taper.  She has been instructed to discontinue if swelling reoccurs.   --this likely was a sarcoidotic flare. Localized swelling on right hand - Plan: predniSONE (DELTASONE) 20 MG tablet  Trena PlattStephanie Leslie Jester, PA-C [facility] 2/6/201812:32 PM

## 2016-10-21 ENCOUNTER — Ambulatory Visit (INDEPENDENT_AMBULATORY_CARE_PROVIDER_SITE_OTHER): Payer: 59 | Admitting: Adult Health

## 2016-10-21 ENCOUNTER — Encounter: Payer: Self-pay | Admitting: Adult Health

## 2016-10-21 VITALS — BP 102/68 | HR 98 | Ht 63.0 in | Wt 176.6 lb

## 2016-10-21 DIAGNOSIS — J45998 Other asthma: Secondary | ICD-10-CM | POA: Diagnosis not present

## 2016-10-21 DIAGNOSIS — D869 Sarcoidosis, unspecified: Secondary | ICD-10-CM

## 2016-10-21 DIAGNOSIS — R059 Cough, unspecified: Secondary | ICD-10-CM

## 2016-10-21 DIAGNOSIS — R05 Cough: Secondary | ICD-10-CM

## 2016-10-21 MED ORDER — PREDNISONE 10 MG PO TABS: ORAL_TABLET | ORAL | 0 refills | Status: DC

## 2016-10-21 NOTE — Addendum Note (Signed)
Addended by: Boone MasterJONES, Bralin Garry E on: 10/21/2016 04:15 PM   Modules accepted: Orders

## 2016-10-21 NOTE — Progress Notes (Signed)
 @Patient  ID: Nancy Villarreal, female    DOB: 1970/12/02, 46 y.o.   MRN: [id number]  Chief Complaint  Patient presents with  . Follow-up    Sarcoidosis    Referring provider: Etta GrandchildJones, Thomas L, MD  HPI: 46 year old female former smoker followed for Sarcoid , Asthma /AR   TEST  Bronch NEG 09/24/08- dx based on mediastinal adenopathy/ interstitial disease, ACE 90, response to steroids  10/21/2016 Follow up : Sarcoid  Pt returns for 2 week follow up . She was seen last ov with acute bronchitis /sarcoid flare . Treated with Augmentin and Steroid taper. Says shortly after starting meds she had right hand swelling and severe joint pain. Seen by PCP , Augmentin /Prednisone was stopped due to concern for allergic reaction. She was started Doxycycline . Right hand swelling did not improve. She was sent to ortho with w/up and joint aspiration that she reports was neg for RA/septic joint . She was started on prednisone again. She was told by ortho that may be sarcoid related. Notes are not available. Says hand/joint swelling is better. Has finished prednisone 1 week ago. Cough and congestion are better. Hemoptysis resolved.  Labs last ov , ACE remained elevated at 86.  Last PFT and CT chest few years ago.     Allergies  Allergen Reactions  . Hydrocodone Nausea And Vomiting    Immunization History  Administered Date(s) Administered  . Influenza Split 06/12/2012  . Influenza Whole 05/04/2011  . Influenza,inj,Quad PF,36+ Mos 07/16/2013, 06/16/2014  . Pneumococcal Polysaccharide-23 05/04/2011  . Tdap 03/07/2012    Past Medical History:  Diagnosis Date  . Abnormal chest x-ray   . Anemia   . Hemoptysis   . 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 Unspecified asthma(493.90)     Tobacco History: History  Smoking Status  . Former Smoker  . Types: Cigarettes  . Quit date: 08/05/2008  Smokeless Tobacco  . Never Used   Counseling given: Not Answered   Outpatient Encounter  Prescriptions as of 10/21/2016  Medication Sig  . Albuterol Sulfate (PROAIR RESPICLICK) 108 (90 Base) MCG/ACT AEPB Inhale 2 puffs into the lungs every 4 (four) hours as needed.  . ferrous fumarate-iron polysaccharide complex (TANDEM) 162-115.2 MG CAPS capsule Take 1 capsule by mouth daily with breakfast.  . ranitidine (ZANTAC) 150 MG capsule Take 150 mg by mouth daily as needed for heartburn.  . [DISCONTINUED] naproxen (NAPROSYN) 500 MG tablet Take 1 tablet (500 mg total) by mouth 2 (two) times daily with a meal. (Patient not taking: Reported on 10/21/2016)   No facility-administered encounter medications on file as of 10/21/2016.      Review of Systems  Constitutional:   No  weight loss, night sweats,  Fevers, chills, fatigue, or  lassitude.  HEENT:   No headaches,  Difficulty swallowing,  Tooth/dental problems, or  Sore throat,                No sneezing, itching, ear ache, nasal congestion, post nasal drip,   CV:  No chest pain,  Orthopnea, PND, swelling in lower extremities, anasarca, dizziness, palpitations, syncope.   GI  No heartburn, indigestion, abdominal pain, nausea, vomiting, diarrhea, change in bowel habits, loss of appetite, bloody stools.   Resp: No shortness of breath with exertion or at rest.  No excess mucus, no productive cough,  No non-productive cough,  No coughing up of blood.  No change in color of mucus.  No wheezing.  No  chest wall deformity  Skin: no rash or lesions.  GU: no dysuria, change in color of urine, no urgency or frequency.  No flank pain, no hematuria   MS:  + joint pain or swelling.  No decreased range of motion.  No back pain.    Physical Exam  BP 102/68 (BP Location: Left Arm, Cuff Size: Normal)   Pulse 98   Ht 5\' 3"  (1.6 m)   Wt 176 lb 9.6 oz (80.1 kg)   LMP 10/07/2016   SpO2 100%   BMI 31.28 kg/m   GEN: A/Ox3; pleasant , NAD, well nourished    HEENT:  [place]/AT,  EACs-clear, TMs-wnl, NOSE-clear, THROAT-clear, no lesions, no postnasal  drip or exudate noted.   NECK:  Supple w/ fair ROM; no JVD; normal carotid impulses w/o bruits; no thyromegaly or nodules palpated; no lymphadenopathy.    RESP  Clear  P & A; w/o, wheezes/ rales/ or rhonchi. no accessory muscle use, no dullness to percussion  CARD:  RRR, no m/r/g, no peripheral edema, pulses intact, no cyanosis or clubbing.  GI:   Soft & nt; nml bowel sounds; no organomegaly or masses detected.   Musco: Warm bil, tender along right hand, no redness .   Neuro: alert, no focal deficits noted.    Skin: Warm, no lesions or rashes    Lab Results:  CBC    Component Value Date/Time   WBC 9.9 10/06/2016 0849   WBC 4.1 01/21/2016 1332   RBC 4.38 10/06/2016 0849   RBC 4.46 01/21/2016 1332   HGB 11.5 (A) 10/06/2016 0849   HGB 12.0 01/21/2016 1332   HCT 34.4 (A) 10/06/2016 0849   HCT 36.5 01/21/2016 1332   PLT 175.0 01/21/2016 1332   MCV 78.5 (A) 10/06/2016 0849   MCH 26.2 (A) 10/06/2016 0849   MCHC 33.4 10/06/2016 0849   MCHC 32.9 01/21/2016 1332   RDW 13.0 01/21/2016 1332   LYMPHSABS 1.6 01/21/2016 1332   MONOABS 0.4 01/21/2016 1332   EOSABS 0.1 01/21/2016 1332   BASOSABS 0.0 01/21/2016 1332    BMET    Component Value Date/Time   NA 136 10/06/2016 0840   K 4.0 10/06/2016 0840   CL 100 10/06/2016 0840   CO2 23 10/06/2016 0840   GLUCOSE 96 10/06/2016 0840   GLUCOSE 96 01/21/2016 1332   BUN 13 10/06/2016 0840   CREATININE 0.75 10/06/2016 0840   CALCIUM 8.9 10/06/2016 0840   GFRNONAA 96 10/06/2016 0840   GFRAA 111 10/06/2016 0840    BNP No results found for: BNP  ProBNP No results found for: PROBNP  Imaging: Dg Chest 2 View  Result Date: 09/27/2016 CLINICAL DATA:  Cough and 3 episodes of hemoptysis today. History of sarcoidosis, asthma, former smoker. EXAM: CHEST  2 VIEW COMPARISON:  PA and lateral chest x-ray of Jan 15, 2014 FINDINGS: The lungs remain well-expanded. The interstitial markings remain increased diffusely. There is no alveolar  infiltrate or pleural effusion. No mediastinal nor hilar lymphadenopathy is observed. The heart and pulmonary vascularity are normal. The trachea is midline. The bony thorax exhibits no acute abnormality. IMPRESSION: Stable diffuse fibrotic changes bilaterally consistent with known sarcoidosis. No superimposed pneumonia, CHF, nor other acute cardiopulmonary abnormality. Electronically Signed   By: David  [place] M.D.   On: 09/27/2016 16:21   Dg Hand Complete Right  Result Date: 10/06/2016 CLINICAL DATA:  Right hand pain.  No known injury . EXAM: RIGHT HAND - COMPLETE 3+ VIEW COMPARISON:  No recent prior. FINDINGS: No  acute bony or joint abnormality identified. No evidence of fracture dislocation. IMPRESSION: No acute abnormality. Electronically Signed   By: Maisie Fus  Register   On: 10/06/2016 08:54     Assessment & Plan:   Sarcoidosis (HCC) Sarcoid flare with bronchitis ? Joint involvement  W/ elevated ACE level.  Advised on eye exam  Will check CT chest and PFT  Low dose steroid challenge for 4-6 weeks  Repeat ACE on return  Steroid pt education   Plan  Patient Instructions  Restart Prednisone 20mg  daily for 2 weeks and 10mg  daily until seen back in office .  Set up CT chest .  Follow up Dr. Maple Hudson  In 4 weeks with PFT and As needed      Asthma in remission Recent flare with bronchitis is resolved      Rubye Oaks, NP 10/21/2016

## 2016-10-21 NOTE — Assessment & Plan Note (Signed)
Recent flare with bronchitis is resolved

## 2016-10-21 NOTE — Assessment & Plan Note (Addendum)
Sarcoid flare with bronchitis ? Joint involvement  W/ elevated ACE level.  Advised on eye exam  Will check CT chest and PFT  Low dose steroid challenge for 4-6 weeks  Repeat ACE on return  Steroid pt education   Plan  Patient Instructions  Restart Prednisone 20mg  daily for 2 weeks and 10mg  daily until seen back in office .  Set up CT chest .  Follow up Dr. Maple HudsonYoung  In 4 weeks with PFT and As needed

## 2016-10-21 NOTE — Patient Instructions (Addendum)
Restart Prednisone 20mg  daily for 2 weeks and 10mg  daily until seen back in office .  Set up CT chest .  Follow up Dr. Maple HudsonYoung  In 4 weeks with PFT and As needed

## 2016-10-27 ENCOUNTER — Ambulatory Visit (INDEPENDENT_AMBULATORY_CARE_PROVIDER_SITE_OTHER)
Admission: RE | Admit: 2016-10-27 | Discharge: 2016-10-27 | Disposition: A | Discharge status: Home / Self Care | Payer: 59 | Source: Ambulatory Visit | Attending: Adult Health | Admitting: Adult Health

## 2016-10-27 DIAGNOSIS — R059 Cough, unspecified: Secondary | ICD-10-CM

## 2016-10-27 DIAGNOSIS — R05 Cough: Secondary | ICD-10-CM | POA: Diagnosis not present

## 2016-10-27 DIAGNOSIS — D869 Sarcoidosis, unspecified: Secondary | ICD-10-CM

## 2016-10-28 NOTE — Progress Notes (Signed)
Called spoke with patient, advised of CT results / recs as stated by TP.  Pt verbalized her understanding and denied any questions. 

## 2016-10-31 ENCOUNTER — Ambulatory Visit (HOSPITAL_COMMUNITY)
Admission: RE | Admit: 2016-10-31 | Discharge: 2016-10-31 | Disposition: A | Discharge status: Home / Self Care | Payer: 59 | Source: Ambulatory Visit | Attending: Internal Medicine | Admitting: Internal Medicine

## 2016-10-31 DIAGNOSIS — J449 Chronic obstructive pulmonary disease, unspecified: Secondary | ICD-10-CM | POA: Diagnosis not present

## 2016-10-31 DIAGNOSIS — R942 Abnormal results of pulmonary function studies: Secondary | ICD-10-CM | POA: Diagnosis not present

## 2016-10-31 DIAGNOSIS — Z87891 Personal history of nicotine dependence: Secondary | ICD-10-CM | POA: Diagnosis not present

## 2016-10-31 DIAGNOSIS — D869 Sarcoidosis, unspecified: Secondary | ICD-10-CM | POA: Insufficient documentation

## 2016-10-31 LAB — PULMONARY FUNCTION TEST
DL/VA % pred: 86 %
DL/VA: 4.05 ml/min/mmHg/L
DLCO UNC % PRED: 60 %
DLCO UNC: 13.85 ml/min/mmHg
DLCO cor % pred: 60 %
DLCO cor: 13.85 ml/min/mmHg
FEF 25-75 PRE: 2.08 L/s
FEF 25-75 Post: 2.67 L/sec
FEF2575-%Change-Post: 28 %
FEF2575-%PRED-POST: 106 %
FEF2575-%PRED-PRE: 83 %
FEV1-%CHANGE-POST: 4 %
FEV1-%PRED-POST: 99 %
FEV1-%PRED-PRE: 94 %
FEV1-Post: 2.28 L
FEV1-Pre: 2.18 L
FEV1FVC-%Change-Post: 8 %
FEV1FVC-%PRED-PRE: 97 %
FEV6-%CHANGE-POST: -3 %
FEV6-%PRED-POST: 94 %
FEV6-%Pred-Pre: 97 %
FEV6-POST: 2.62 L
FEV6-Pre: 2.71 L
FEV6FVC-%PRED-POST: 103 %
FEV6FVC-%Pred-Pre: 103 %
FVC-%Change-Post: -3 %
FVC-%Pred-Post: 91 %
FVC-%Pred-Pre: 95 %
FVC-Post: 2.62 L
FVC-Pre: 2.72 L
PRE FEV6/FVC RATIO: 100 %
Post FEV1/FVC ratio: 87 %
Post FEV6/FVC ratio: 100 %
Pre FEV1/FVC ratio: 80 %
RV % PRED: 84 %
RV: 1.41 L
TLC % pred: 82 %
TLC: 4.02 L

## 2016-10-31 MED ORDER — ALBUTEROL SULFATE (2.5 MG/3ML) 0.083% IN NEBU
2.5000 mg | INHALATION_SOLUTION | Freq: Once | RESPIRATORY_TRACT | Status: AC
  Administered 2016-10-31: 2.5 mg via RESPIRATORY_TRACT

## 2016-11-01 ENCOUNTER — Ambulatory Visit (INDEPENDENT_AMBULATORY_CARE_PROVIDER_SITE_OTHER): Payer: 59 | Admitting: Adult Health

## 2016-11-01 ENCOUNTER — Encounter: Payer: Self-pay | Admitting: Adult Health

## 2016-11-01 DIAGNOSIS — J45998 Other asthma: Secondary | ICD-10-CM | POA: Diagnosis not present

## 2016-11-01 DIAGNOSIS — D869 Sarcoidosis, unspecified: Secondary | ICD-10-CM | POA: Diagnosis not present

## 2016-11-01 NOTE — Patient Instructions (Signed)
Decrease prednisone 10mg  daily for 1 day then 5mg  daily for 2 days and stop .  Make appointment with eye doctor for exam.  Follow up with ortho for hand and joint issues.  follow up Dr. Maple HudsonYoung  In April as planned and As needed   Please contact office for sooner follow up if symptoms do not improve or worsen or seek emergency care

## 2016-11-01 NOTE — Assessment & Plan Note (Signed)
Recent AB flare with sarcoid -resolved   Plan  Patient Instructions  Decrease prednisone 10mg  daily for 1 day then 5mg  daily for 2 days and stop .  Make appointment with eye doctor for exam.  Follow up with ortho for hand and joint issues.  follow up Dr. Maple HudsonYoung  In April as planned and As needed   Please contact office for sooner follow up if symptoms do not improve or worsen or seek emergency care

## 2016-11-01 NOTE — Assessment & Plan Note (Signed)
Recent flare with bronchitis  ? Joint involvement  Advised on eye exam  Will taper steroids to off since pulmonary issues are resolved and PFT/CT chest are stable  Refer back to ortho , if no improvement , refer to rheurmatology   Plan  Patient Instructions  Decrease prednisone 10mg  daily for 1 day then 5mg  daily for 2 days and stop .  Make appointment with eye doctor for exam.  Follow up with ortho for hand and joint issues.  follow up Dr. Maple HudsonYoung  In April as planned and As needed   Please contact office for sooner follow up if symptoms do not improve or worsen or seek emergency care

## 2016-11-01 NOTE — Progress Notes (Signed)
 @Patient  ID: Nancy Villarreal, female    DOB: 12-12-1970, 46 y.o.   MRN: [id number]  No chief complaint on file.   Referring provider: Etta GrandchildJones, Thomas L, MD  HPI:   Allergies  Allergen Reactions  . Hydrocodone Nausea And Vomiting    Immunization History  Administered Date(s) Administered  . Influenza Split 06/12/2012  . Influenza Whole 05/04/2011  . Influenza,inj,Quad PF,36+ Mos 07/16/2013, 06/16/2014  . Pneumococcal Polysaccharide-23 05/04/2011  . Tdap 03/07/2012    Past Medical History:  Diagnosis Date  . Abnormal chest x-ray   . Anemia   . Hemoptysis   . 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 Unspecified asthma(493.90)     Tobacco History: History  Smoking Status  . Former Smoker  . Types: Cigarettes  . Quit date: 08/05/2008  Smokeless Tobacco  . Never Used   Counseling given: Not Answered   Outpatient Encounter Prescriptions as of 11/01/2016  Medication Sig  . Albuterol Sulfate (PROAIR RESPICLICK) 108 (90 Base) MCG/ACT AEPB Inhale 2 puffs into the lungs every 4 (four) hours as needed.  . ferrous fumarate-iron polysaccharide complex (TANDEM) 162-115.2 MG CAPS capsule Take 1 capsule by mouth daily with breakfast.  . predniSONE (DELTASONE) 10 MG tablet Take 2 tablets by mouth once daily for 2 weeks, then decrease to 1 tablet daily in the morning and hold until next office visit  . ranitidine (ZANTAC) 150 MG capsule Take 150 mg by mouth daily as needed for heartburn.   No facility-administered encounter medications on file as of 11/01/2016.      Review of Systems  Constitutional:   No  weight loss, night sweats,  Fevers, chills, fatigue, or  lassitude.  HEENT:   No headaches,  Difficulty swallowing,  Tooth/dental problems, or  Sore throat,                No sneezing, itching, ear ache, nasal congestion, post nasal drip,   CV:  No chest pain,  Orthopnea, PND, swelling in lower extremities, anasarca, dizziness, palpitations, syncope.   GI  No  heartburn, indigestion, abdominal pain, nausea, vomiting, diarrhea, change in bowel habits, loss of appetite, bloody stools.   Resp: No shortness of breath with exertion or at rest.  No excess mucus, no productive cough,  No non-productive cough,  No coughing up of blood.  No change in color of mucus.  No wheezing.  No chest wall deformity  Skin: no rash or lesions.  GU: no dysuria, change in color of urine, no urgency or frequency.  No flank pain, no hematuria   MS:  No joint pain or swelling.  No decreased range of motion.  No back pain.    Physical Exam  BP 126/74 (BP Location: Left Arm, Cuff Size: Normal)   Pulse 88   Ht 5\' 3"  (1.6 m)   Wt 176 lb (79.8 kg)   LMP 10/07/2016   SpO2 100%   BMI 31.18 kg/m   GEN: A/Ox3; pleasant , NAD, well nourished    HEENT:  [place]/AT,  EACs-clear, TMs-wnl, NOSE-clear, THROAT-clear, no lesions, no postnasal drip or exudate noted.   NECK:  Supple w/ fair ROM; no JVD; normal carotid impulses w/o bruits; no thyromegaly or nodules palpated; no lymphadenopathy.    RESP  Clear  P & A; w/o, wheezes/ rales/ or rhonchi. no accessory muscle use, no dullness to percussion  CARD:  RRR, no m/r/g, no peripheral edema, pulses intact, no cyanosis or clubbing.  GI:  Soft & nt; nml bowel sounds; no organomegaly or masses detected.   Musco: Warm bil, no deformities or joint swelling noted.   Neuro: alert, no focal deficits noted.    Skin: Warm, no lesions or rashes  Psych:  No change in mood or affect. No depression or anxiety.  No memory loss.  Lab Results:  CBC    Component Value Date/Time   WBC 9.9 10/06/2016 0849   WBC 4.1 01/21/2016 1332   RBC 4.38 10/06/2016 0849   RBC 4.46 01/21/2016 1332   HGB 11.5 (A) 10/06/2016 0849   HGB 12.0 01/21/2016 1332   HCT 34.4 (A) 10/06/2016 0849   HCT 36.5 01/21/2016 1332   PLT 175.0 01/21/2016 1332   MCV 78.5 (A) 10/06/2016 0849   MCH 26.2 (A) 10/06/2016 0849   MCHC 33.4 10/06/2016 0849   MCHC 32.9  01/21/2016 1332   RDW 13.0 01/21/2016 1332   LYMPHSABS 1.6 01/21/2016 1332   MONOABS 0.4 01/21/2016 1332   EOSABS 0.1 01/21/2016 1332   BASOSABS 0.0 01/21/2016 1332    BMET    Component Value Date/Time   NA 136 10/06/2016 0840   K 4.0 10/06/2016 0840   CL 100 10/06/2016 0840   CO2 23 10/06/2016 0840   GLUCOSE 96 10/06/2016 0840   GLUCOSE 96 01/21/2016 1332   BUN 13 10/06/2016 0840   CREATININE 0.75 10/06/2016 0840   CALCIUM 8.9 10/06/2016 0840   GFRNONAA 96 10/06/2016 0840   GFRAA 111 10/06/2016 0840    BNP No results found for: BNP  ProBNP No results found for: PROBNP  Imaging: Ct Chest Wo Contrast  Result Date: 10/27/2016 CLINICAL DATA:  46 year old female with history of sarcoidosis and recent history of bronchitis. Persistent cough. EXAM: CT CHEST WITHOUT CONTRAST TECHNIQUE: Multidetector CT imaging of the chest was performed following the standard protocol without IV contrast. COMPARISON:  Chest CT 12/13/2012. FINDINGS: Cardiovascular: Heart size is normal. There is no significant pericardial fluid, thickening or pericardial calcification. No atherosclerotic calcifications are noted within the thoracic aorta or the coronary arteries. Mediastinum/Nodes: Numerous prominent borderline enlarged and mildly enlarged mediastinal and bilateral hilar lymph nodes measuring up to 1.5 cm in the subcarinal nodal station, many of which demonstrate coarse calcifications. The overall appearance of this lymphadenopathy is very similar to the prior examination and compatible with the patient's history of sarcoidosis. Esophagus is unremarkable in appearance. No axillary lymphadenopathy. Lungs/Pleura: As with prior examinations there are widespread areas of thickening of the peribronchovascular interstitium with associated peribronchovascular micronodularity, and there continues to be multifocal nodularity along the fissures, similar to the prior examination. No other larger more suspicious  appearing pulmonary nodules or masses. No acute consolidative airspace disease. No pleural effusions. Upper Abdomen: Unremarkable. Musculoskeletal: There are no aggressive appearing lytic or blastic lesions noted in the visualized portions of the skeleton. IMPRESSION: 1. The overall appearance of the thorax is very similar to the prior study, and again compatible with the patient's reported clinical history of sarcoidosis. No acute findings are noted on today's examination. Electronically Signed   By: Trudie Reed M.D.   On: 10/27/2016 12:16   Dg Hand Complete Right  Result Date: 10/06/2016 CLINICAL DATA:  Right hand pain.  No known injury . EXAM: RIGHT HAND - COMPLETE 3+ VIEW COMPARISON:  No recent prior. FINDINGS: No acute bony or joint abnormality identified. No evidence of fracture dislocation. IMPRESSION: No acute abnormality. Electronically Signed   By: Maisie Fus  Register   On: 10/06/2016 08:54  Assessment & Plan:   No problem-specific Assessment & Plan notes found for this encounter.     Rubye Oaks, NP 11/01/2016

## 2016-11-01 NOTE — Progress Notes (Signed)
@Patient  ID: Nancy Villarreal, female    DOB: 07-11-1971, 46 y.o.   MRN: [id number]  Chief Complaint  Patient presents with  . Follow-up    Sarcoid     Referring provider: Etta Grandchild, MD  HPI: 46 year old female former smoker followed for Sarcoid , Asthma /AR   TEST  Bronch NEG 09/24/08- dx based on mediastinal adenopathy/ interstitial disease, ACE 90, response to steroids  11/01/2016 Follow up : Sarcoid  Pt returns for follow up for Sarcoid . She was treated earlier this month for suspected acute bronchitis with sarcoid flare. She was started on Augmentin and steroids taper. She thought she was having an allergic reaction (right hand swelling)  to meds and they were stopped. She was started on Doxycycline . Her hand did not improve and was seen by ortho w/ joint aspiration . Told she did not have gout or RA . Felt this was sarcoid related and started back on prednisone .  Her bronchitic sx did resolve including hemoptysis . CXR and CT chest showed stable sarcoid changes w/out progression . ACE remained elevated 99>86.  PFT done yesterday showed stable lung function w/out decline . Prevous DLCO was 55% , current DLCO is 60% .  Her breathing has returned to normal but she continues to have joint pain in right hand and feet. She is tolerating prednisone, it is causing insomnia, daytime sleepiness, moodiness and irritability . Complains of itchy eyes for last week.       Allergies  Allergen Reactions  . Hydrocodone Nausea And Vomiting    Immunization History  Administered Date(s) Administered  . Influenza Split 06/12/2012  . Influenza Whole 05/04/2011  . Influenza,inj,Quad PF,36+ Mos 07/16/2013, 06/16/2014  . Pneumococcal Polysaccharide-23 05/04/2011  . Tdap 03/07/2012    Past Medical History:  Diagnosis Date  . Abnormal chest x-ray   . Anemia   . Hemoptysis   . 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 Unspecified asthma(493.90)     Tobacco  History: History  Smoking Status  . Former Smoker  . Types: Cigarettes  . Quit date: 08/05/2008  Smokeless Tobacco  . Never Used   Counseling given: Not Answered   Outpatient Encounter Prescriptions as of 11/01/2016  Medication Sig  . Albuterol Sulfate (PROAIR RESPICLICK) 108 (90 Base) MCG/ACT AEPB Inhale 2 puffs into the lungs every 4 (four) hours as needed.  . ferrous fumarate-iron polysaccharide complex (TANDEM) 162-115.2 MG CAPS capsule Take 1 capsule by mouth daily with breakfast.  . predniSONE (DELTASONE) 10 MG tablet Take 2 tablets by mouth once daily for 2 weeks, then decrease to 1 tablet daily in the morning and hold until next office visit  . ranitidine (ZANTAC) 150 MG capsule Take 150 mg by mouth daily as needed for heartburn.   No facility-administered encounter medications on file as of 11/01/2016.      Review of Systems  Constitutional:   No  weight loss, night sweats,  Fevers, chills, fatigue, or  lassitude.  HEENT:   No headaches,  Difficulty swallowing,  Tooth/dental problems, or  Sore throat,                No sneezing, itching, ear ache, + nasal congestion, post nasal drip,   CV:  No chest pain,  Orthopnea, PND, swelling in lower extremities, anasarca, dizziness, palpitations, syncope.   GI  No heartburn, indigestion, abdominal pain, nausea, vomiting, diarrhea, change in bowel habits, loss of appetite, bloody stools.  Resp: No shortness of breath with exertion or at rest.  No excess mucus, no productive cough,  No non-productive cough,  No coughing up of blood.  No change in color of mucus.  No wheezing.  No chest wall deformity  Skin: no rash or lesions.  GU: no dysuria, change in color of urine, no urgency or frequency.  No flank pain, no hematuria   MS:  No joint pain or swelling.  No decreased range of motion.  No back pain.    Physical Exam  BP 126/74 (BP Location: Left Arm, Cuff Size: Normal)   Pulse 88   Ht 5\' 3"  (1.6 m)   Wt 176 lb (79.8 kg)    LMP 10/07/2016   SpO2 100%   BMI 31.18 kg/m   GEN: A/Ox3; pleasant , NAD, well nourished    HEENT:  [place]/AT,  EACs-clear, TMs-wnl, NOSE-clear, THROAT-clear, no lesions, no postnasal drip or exudate noted.   NECK:  Supple w/ fair ROM; no JVD; normal carotid impulses w/o bruits; no thyromegaly or nodules palpated; no lymphadenopathy.    RESP  Clear  P & A; w/o, wheezes/ rales/ or rhonchi. no accessory muscle use, no dullness to percussion  CARD:  RRR, no m/r/g, no peripheral edema, pulses intact, no cyanosis or clubbing.  GI:   Soft & nt; nml bowel sounds; no organomegaly or masses detected.   Musco: Warm bil, no deformities +tenderness along right hand  Neuro: alert, no focal deficits noted.    Skin: Warm, no lesions or rashes    Lab Results:  ProBNP No results found for: PROBNP  Imaging: Ct Chest Wo Contrast  Result Date: 10/27/2016 CLINICAL DATA:  46 year old female with history of sarcoidosis and recent history of bronchitis. Persistent cough. EXAM: CT CHEST WITHOUT CONTRAST TECHNIQUE: Multidetector CT imaging of the chest was performed following the standard protocol without IV contrast. COMPARISON:  Chest CT 12/13/2012. FINDINGS: Cardiovascular: Heart size is normal. There is no significant pericardial fluid, thickening or pericardial calcification. No atherosclerotic calcifications are noted within the thoracic aorta or the coronary arteries. Mediastinum/Nodes: Numerous prominent borderline enlarged and mildly enlarged mediastinal and bilateral hilar lymph nodes measuring up to 1.5 cm in the subcarinal nodal station, many of which demonstrate coarse calcifications. The overall appearance of this lymphadenopathy is very similar to the prior examination and compatible with the patient's history of sarcoidosis. Esophagus is unremarkable in appearance. No axillary lymphadenopathy. Lungs/Pleura: As with prior examinations there are widespread areas of thickening of the  peribronchovascular interstitium with associated peribronchovascular micronodularity, and there continues to be multifocal nodularity along the fissures, similar to the prior examination. No other larger more suspicious appearing pulmonary nodules or masses. No acute consolidative airspace disease. No pleural effusions. Upper Abdomen: Unremarkable. Musculoskeletal: There are no aggressive appearing lytic or blastic lesions noted in the visualized portions of the skeleton. IMPRESSION: 1. The overall appearance of the thorax is very similar to the prior study, and again compatible with the patient's reported clinical history of sarcoidosis. No acute findings are noted on today's examination. Electronically Signed   By: Trudie Reed M.D.   On: 10/27/2016 12:16   Dg Hand Complete Right  Result Date: 10/06/2016 CLINICAL DATA:  Right hand pain.  No known injury . EXAM: RIGHT HAND - COMPLETE 3+ VIEW COMPARISON:  No recent prior. FINDINGS: No acute bony or joint abnormality identified. No evidence of fracture dislocation. IMPRESSION: No acute abnormality. Electronically Signed   By: Maisie Fus  Register   On: 10/06/2016 08:54  Assessment & Plan:   Asthma in remission Recent AB flare with sarcoid -resolved   Plan  Patient Instructions  Decrease prednisone 10mg  daily for 1 day then 5mg  daily for 2 days and stop .  Make appointment with eye doctor for exam.  Follow up with ortho for hand and joint issues.  follow up Dr. Maple HudsonYoung  In April as planned and As needed   Please contact office for sooner follow up if symptoms do not improve or worsen or seek emergency care      Sarcoidosis [facility](HCC) Recent flare with bronchitis  ? Joint involvement  Advised on eye exam  Will taper steroids to off since pulmonary issues are resolved and PFT/CT chest are stable  Refer back to ortho , if no improvement , refer to rheurmatology   Plan  Patient Instructions  Decrease prednisone 10mg  daily for 1 day then 5mg   daily for 2 days and stop .  Make appointment with eye doctor for exam.  Follow up with ortho for hand and joint issues.  follow up Dr. Maple HudsonYoung  In April as planned and As needed   Please contact office for sooner follow up if symptoms do not improve or worsen or seek emergency care         Nancy Oaksammy Calia Napp, NP 11/01/2016

## 2016-11-02 ENCOUNTER — Telehealth: Payer: Self-pay | Admitting: Adult Health

## 2016-11-02 DIAGNOSIS — M2550 Pain in unspecified joint: Secondary | ICD-10-CM

## 2016-11-02 DIAGNOSIS — D869 Sarcoidosis, unspecified: Secondary | ICD-10-CM

## 2016-11-02 NOTE — Telephone Encounter (Signed)
Yes please send referral to Rheumatology  Sarcoid , joint pain.

## 2016-11-02 NOTE — Telephone Encounter (Signed)
Spoke with pt. She is aware that we will place this referral. Referral has been placed. Nothing further was needed.

## 2016-11-02 NOTE — Telephone Encounter (Signed)
Spoke with pt. States that she is needing a referral to rheumatology. She saw Ortho and they are not really wanting to help her with her issues.  TP - please advise if we can place referral. Thanks.

## 2016-11-21 ENCOUNTER — Ambulatory Visit: Payer: 59 | Admitting: Adult Health

## 2016-12-16 NOTE — Progress Notes (Signed)
Office Visit Note  Patient: Nancy Villarreal             Date of Birth: [birth year]           MRN: [id number]             PCP: Scarlette Calico, MD Referring: Janith Lima, MD Visit Date: 12/19/2016 Occupation: _0 @    Subjective Pain in multiple joints   History of Present Illness: Nancy Villarreal is a 46 y.o. female seen in consultation per request of her PCP. According to patient in 2009 she developed productive cough with some blood and was seen in urgent care. From there she was referred to Dr. Annamaria Boots. In January 2010 she had a lung biopsy which was positive for sarcoidosis. She states she was on prednisone for approximately 3 years anywhere from 40-80 mg by mouth daily. By 2015 she went into remission. In January 2017 she developed upper respiratory tract infection and later pneumonia she states she was on prednisone on and off. In January 2018 she was diagnosed with bronchitis. She was seen at [facility] urgent care where she was given amoxicillin and prednisone for bronchitis. Soon after that she developed multiple arthralgias involving her shoulders her feet and swelling in her right hand. It was felt that she was having allergic reaction to amoxicillin and amoxicillin was discontinued. Later the prednisone was discontinued. She states she was off and on on prednisone. The swelling in her right hand lasted for about a week. But the pain persist. She was also refer to [place] where she had one of the joint in her hands aspirated. She has not taken any prednisone since February 2018. she had MRI of her lungs last month according to patient it did not show active disease. She denies any shortness of breath currently.  Activities of Daily Living:  Patient reports morning stiffness for 20 minutes.   Patient Reports nocturnal pain.  Difficulty dressing/grooming: Reports Difficulty climbing stairs: Denies Difficulty getting out of chair: Denies Difficulty using hands for taps,  buttons, cutlery, and/or writing: Denies   Review of Systems  Constitutional: Positive for fatigue. Negative for night sweats, weight gain, weight loss and weakness.  HENT: Positive for mouth sores and mouth dryness. Negative for trouble swallowing, trouble swallowing and nose dryness.   Eyes: Negative for pain, redness, visual disturbance and dryness.  Respiratory: Negative for cough, shortness of breath and difficulty breathing.   Cardiovascular: Negative for chest pain, palpitations, hypertension, irregular heartbeat and swelling in legs/feet.  Gastrointestinal: Negative for blood in stool, constipation and diarrhea.  Endocrine: Negative for increased urination.  Genitourinary: Negative for vaginal dryness.  Musculoskeletal: Positive for arthralgias, joint pain and morning stiffness. Negative for joint swelling, myalgias, muscle weakness, muscle tenderness and myalgias.  Skin: Negative for color change, rash, hair loss, skin tightness, ulcers and sensitivity to sunlight.  Allergic/Immunologic: Negative for susceptible to infections.  Neurological: Negative for dizziness, memory loss and night sweats.  Hematological: Negative for swollen glands.  Psychiatric/Behavioral: Negative for depressed mood and sleep disturbance. The patient is not nervous/anxious.     PMFS History:  Patient Active Problem List   Diagnosis Date Noted  . Nausea 08/09/2016  . Cough 03/09/2016  . Obesity (BMI 30-39.9) 01/22/2016  . Right foot pain 01/21/2016  . Routine general medical examination at a health care facility 12/11/2013  . Visit for screening mammogram 03/07/2012  . Anemia, iron deficiency 02/10/2012  . Sarcoidosis ([place]) 09/17/2008  . Asthma in remission 09/03/2008  .  Allergic rhinitis due to pollen 09/03/2008    Past Medical History:  Diagnosis Date  . Abnormal chest x-ray   . Anemia   . Hemoptysis   . Sarcoid    bronch negative 09-2008 ACE 93  . Unspecified asthma(493.90)     Family  History  Problem Relation Age of Onset  . Asthma Paternal Grandfather   . Lung cancer Maternal Grandfather   . Hyperlipidemia Mother   . Hypertension Mother   . Diabetes Father   . Hyperlipidemia Maternal Grandmother   . Stroke Maternal Grandmother   . Cancer Paternal Grandmother   . Alcohol abuse Paternal Grandmother   . Early death Paternal Grandmother    Past Surgical History:  Procedure Laterality Date  . BRONCHOSCOPY  09-2008   Negative  . CESAREAN SECTION    . TUBAL LIGATION     1995   Social History   Social History Narrative  . No narrative on file     Objective: Vital Signs: BP 118/68   Pulse 80   Resp 14   Ht _0  (1.6 m)   Wt 180 lb (81.6 kg)   LMP 12/09/2016 (Exact Date)   BMI 31.89 kg/m    Physical Exam  Constitutional: She is oriented to person, place, and time. She appears well-developed and well-nourished.  HENT:  Head: Normocephalic and atraumatic.  Hyperpigmentation noted on the hard palate. No ulceration was noted.  Eyes: Conjunctivae and EOM are normal.  Neck: Normal range of motion.  Cardiovascular: Normal rate, regular rhythm, normal heart sounds and intact distal pulses.   Pulmonary/Chest: Effort normal and breath sounds normal.  Abdominal: Soft. Bowel sounds are normal.  Lymphadenopathy:    She has no cervical adenopathy.  Neurological: She is alert and oriented to person, place, and time.  Skin: Skin is warm and dry. Capillary refill takes less than 2 seconds.  Psychiatric: She has a normal mood and affect. Her behavior is normal.  Nursing note and vitals reviewed.    Musculoskeletal Exam: C-spine and thoracic lumbar spine good range of motion. She had no SI joint tenderness. She is some discomfort with range of motion of her left shoulder. She has synovitis on palpation of her right first second and third MCP joints. Also tenderness in the similar joints. Hip joints, knee joints, ankles, MTPs PIPs DIPs with good range of motion. She  has some thickening of bilateral first MTP joint. But no synovitis was noted.  CDAI Exam: No CDAI exam completed.    Investigation: Findings:  10/06/2016 CBC hemoglobin 11.5, BMP normal, ESR 12 09/30/2016 Ace 86, ESR 65 May 2017 ferritin low at 2.9, percent saturation low at 11.9    Imaging: Xr Foot 2 Views Left  Result Date: 12/19/2016 No MTP PIP/DIP narrowing was noted. A small calcaneal spur was noted.  Xr Foot 2 Views Right  Result Date: 12/19/2016 Right first MTP mild narrowing and subluxation all PIP/DIP normal. No erosive changes were noted. A small calcaneal spur was noted.  Xr Hand 2 View Left  Result Date: 12/19/2016 Minimal narrowing of the left second MCP joint. All PIP narrowing was noted. No intercarpal joint space narrowing was noted. No erosive changes were noted. 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 Some cystic changes noted in the carpal bones. Impression: These findings are consistent with inflammatory arthritis  Xr Shoulder Left  Result Date: 12/19/2016 Type II acromion noted no glenohumeral joint space narrowing narrowing noted. No chondrocalcinosis noted.   Speciality Comments: No specialty comments available.    Procedures:  No procedures performed Allergies: Hydrocodone   Assessment / Plan:     Visit Diagnoses: Sarcoidosis - ACE 86, followed up by Dr. Annamaria Boots. Patient has long-standing history of sarcoidosis for multiple years treated with prednisone off and on. She had a recurrence in 2017. She is currently suffering from polyarthralgia. She has synovitis on examination of her right hand. I will obtain following labs. We had detailed discussion regarding long-term use of methotrexate. Indications side effects contraindications were discussed at  length. The plan is to obtain an informed consent today and once the labs are available and we'll start her on methotrexate 4 tablets by mouth every week and increase it every 2 weeks to reach 8 tablets by mouth every week along with folic acid. Labs will be monitored every 2 weeks 3 and then every 2 months to monitor for drug toxicity.  Left shoulder joint pain: She had pain with internal rotation of her left shoulder but no effusion was noted.  Pain in bilateral hands: She had synovitis in her right hand MCPs and tenderness.  Right foot pain: She complains of bilateral foot pain but no synovitis was noted.  Other iron deficiency anemia  Obesity (BMI 30-39.9)    Orders: Orders Placed This Encounter  Procedures  . XR Shoulder Left  . XR Hand 2 View Left  . XR Hand 2 View Right  . XR Foot 2 Views Left  . XR Foot 2 Views Right  . COMPLETE METABOLIC PANEL WITH GFR  . Hepatitis panel, acute  . HIV antibody  . Quantiferon tb gold assay (blood)  . Serum protein electrophoresis with reflex  . IgG, IgA, IgM  . Rheumatoid factor  . Cyclic citrul peptide antibody, IgG  . Uric acid   No orders of the defined types were placed in this encounter.   Face-to-face time spent with patient was 50 minutes. 50% of time was spent in counseling and coordination of care.  Follow-Up Instructions: Return for Sarcoidosis.   Bo Merino, MD  Note - This record has been created using Editor, commissioning.  Chart creation errors have been sought, but may not always  have been located. Such creation errors do not reflect on  the standard of medical care.

## 2016-12-19 ENCOUNTER — Ambulatory Visit (INDEPENDENT_AMBULATORY_CARE_PROVIDER_SITE_OTHER): Payer: 59

## 2016-12-19 ENCOUNTER — Encounter: Payer: Self-pay | Admitting: Rheumatology

## 2016-12-19 ENCOUNTER — Ambulatory Visit (INDEPENDENT_AMBULATORY_CARE_PROVIDER_SITE_OTHER): Payer: 59 | Admitting: Rheumatology

## 2016-12-19 VITALS — BP 118/68 | HR 80 | Resp 14 | Ht 63.0 in | Wt 180.0 lb

## 2016-12-19 DIAGNOSIS — D508 Other iron deficiency anemias: Secondary | ICD-10-CM | POA: Diagnosis not present

## 2016-12-19 DIAGNOSIS — M79641 Pain in right hand: Secondary | ICD-10-CM

## 2016-12-19 DIAGNOSIS — M25512 Pain in left shoulder: Secondary | ICD-10-CM

## 2016-12-19 DIAGNOSIS — M25572 Pain in left ankle and joints of left foot: Secondary | ICD-10-CM

## 2016-12-19 DIAGNOSIS — E669 Obesity, unspecified: Secondary | ICD-10-CM

## 2016-12-19 DIAGNOSIS — D869 Sarcoidosis, unspecified: Secondary | ICD-10-CM

## 2016-12-19 DIAGNOSIS — M79671 Pain in right foot: Secondary | ICD-10-CM | POA: Diagnosis not present

## 2016-12-19 DIAGNOSIS — Z1159 Encounter for screening for other viral diseases: Secondary | ICD-10-CM

## 2016-12-19 DIAGNOSIS — M79642 Pain in left hand: Secondary | ICD-10-CM

## 2016-12-19 DIAGNOSIS — Z114 Encounter for screening for human immunodeficiency virus [HIV]: Secondary | ICD-10-CM

## 2016-12-19 NOTE — Progress Notes (Signed)
Pharmacy Note  Subjective: Patient presents today to the [facility] to see Dr. Corliss Skains.  Patient seen by the pharmacist for counseling on methotrexate.    Objective: CBC    Component Value Date/Time   WBC 9.9 10/06/2016 0849   WBC 4.1 01/21/2016 1332   RBC 4.38 10/06/2016 0849   RBC 4.46 01/21/2016 1332   HGB 11.5 (A) 10/06/2016 0849   HGB 12.0 01/21/2016 1332   HCT 34.4 (A) 10/06/2016 0849   HCT 36.5 01/21/2016 1332   PLT 175.0 01/21/2016 1332   MCV 78.5 (A) 10/06/2016 0849   MCH 26.2 (A) 10/06/2016 0849   MCHC 33.4 10/06/2016 0849   MCHC 32.9 01/21/2016 1332   RDW 13.0 01/21/2016 1332   LYMPHSABS 1.6 01/21/2016 1332   MONOABS 0.4 01/21/2016 1332   EOSABS 0.1 01/21/2016 1332   BASOSABS 0.0 01/21/2016 1332   CMP     Component Value Date/Time   NA 136 10/06/2016 0840   K 4.0 10/06/2016 0840   CL 100 10/06/2016 0840   CO2 23 10/06/2016 0840   GLUCOSE 96 10/06/2016 0840   GLUCOSE 96 01/21/2016 1332   BUN 13 10/06/2016 0840   CREATININE 0.75 10/06/2016 0840   CALCIUM 8.9 10/06/2016 0840   PROT 8.2 01/21/2016 1332   ALBUMIN 4.0 01/21/2016 1332   AST 15 01/21/2016 1332   ALT 9 01/21/2016 1332   ALKPHOS 45 01/21/2016 1332   BILITOT 0.3 01/21/2016 1332   GFRNONAA 96 10/06/2016 0840   GFRAA 111 10/06/2016 0840   TB Gold: ordered today Hepatitis panel: ordered today HIV: ordered today  Chest-xray:  09/27/16  Contraception: Tubal ligation in 1995  Assessment/Plan:  Patient was counseled on the purpose, proper use, and adverse effects of methotrexate including nausea, infection, and signs and symptoms of pneumonitis.  Reviewed instructions with patient to take methotrexate 4 tablets weekly for two weeks, then 6 tablets weekly for two weeks, then 8 tablets weekly along with folic acid 2 mg daily.  Discussed the importance of frequent monitoring of kidney and liver function and blood counts, and provided patient with standing lab instructions.  Counseled  patient to avoid NSAIDs and alcohol while on methotrexate.  Provided patient with educational materials on methotrexate and answered all questions.  Advised patient to get annual influenza vaccine and to get a pneumococcal vaccine if patient has not already had one.  Patient voiced understanding.  Patient consented to methotrexate use.  Will upload into chart.  Will plan on initiation of methotrexate once baseline labs return.    Lilla Shook, Pharm.D., BCPS Clinical Pharmacist Pager: [phone] Phone: [phone] 12/19/2016 8:58 AM

## 2016-12-19 NOTE — Patient Instructions (Addendum)
Standing Labs We placed an order today for your standing lab work.    Please come back and get your standing labs in 2 weeks times 3, then every 2 months.  We have open lab Monday through Friday from 8:30-11:30 AM and 1:30-4 PM at the office of Dr. Arbutus Ped, PA.   The office is located at [address], [place], [place] [address] No appointment is necessary.   Labs are drawn by [facility].  You may receive a bill from [facility] for your lab work.      Methotrexate tablets What is this medicine? METHOTREXATE (METH oh TREX ate) is a chemotherapy drug used to treat cancer including breast cancer, leukemia, and lymphoma. This medicine can also be used to treat psoriasis and certain kinds of arthritis. This medicine may be used for other purposes; ask your health care provider or pharmacist if you have questions. COMMON BRAND NAME(S): Rheumatrex, Trexall What should I tell my health care provider before I take this medicine? They need to know if you have any of these conditions: -fluid in the stomach area or lungs -if you often drink alcohol -infection or immune system problems -kidney disease or on hemodialysis -liver disease -low blood counts, like low white cell, platelet, or red cell counts -lung disease -radiation therapy -stomach ulcers -ulcerative colitis -an unusual or allergic reaction to methotrexate, other medicines, foods, dyes, or preservatives -pregnant or trying to get pregnant -breast-feeding How should I use this medicine? Take this medicine by mouth with a glass of water. Follow the directions on the prescription label. Take your medicine at regular intervals. Do not take it more often than directed. Do not stop taking except on your doctor's advice. Make sure you know why you are taking this medicine and how often you should take it. If this medicine is used for a condition that is not cancer, like arthritis or psoriasis, it should be taken  weekly, NOT daily. Taking this medicine more often than directed can cause serious side effects, even death. Talk to your healthcare provider about safe handling and disposal of this medicine. You may need to take special precautions. Talk to your pediatrician regarding the use of this medicine in children. While this drug may be prescribed for selected conditions, precautions do apply. Overdosage: If you think you have taken too much of this medicine contact a poison control center or emergency room at once. NOTE: This medicine is only for you. Do not share this medicine with others. What if I miss a dose? If you miss a dose, talk with your doctor or health care professional. Do not take double or extra doses. What may interact with this medicine? This medicine may interact with the following medication: -acitretin -aspirin and aspirin-like medicines including salicylates -azathioprine -certain antibiotics like penicillins, tetracycline, and chloramphenicol -cyclosporine -gold -hydroxychloroquine -live virus vaccines -NSAIDs, medicines for pain and inflammation, like ibuprofen or naproxen -other cytotoxic agents -penicillamine -phenylbutazone -phenytoin -probenecid -retinoids such as isotretinoin and tretinoin -steroid medicines like prednisone or cortisone -sulfonamides like sulfasalazine and trimethoprim/sulfamethoxazole -theophylline This list may not describe all possible interactions. Give your health care provider a list of all the medicines, herbs, non-prescription drugs, or dietary supplements you use. Also tell them if you smoke, drink alcohol, or use illegal drugs. Some items may interact with your medicine. What should I watch for while using this medicine? Avoid alcoholic drinks. This medicine can make you more sensitive to the sun. Keep out of the sun. If  you cannot avoid being in the sun, wear protective clothing and use sunscreen. Do not use sun lamps or tanning  beds/booths. You may need blood work done while you are taking this medicine. Call your doctor or health care professional for advice if you get a fever, chills or sore throat, or other symptoms of a cold or flu. Do not treat yourself. This drug decreases your body's ability to fight infections. Try to avoid being around people who are sick. This medicine may increase your risk to bruise or bleed. Call your doctor or health care professional if you notice any unusual bleeding. Check with your doctor or health care professional if you get an attack of severe diarrhea, nausea and vomiting, or if you sweat a lot. The loss of too much body fluid can make it dangerous for you to take this medicine. Talk to your doctor about your risk of cancer. You may be more at risk for certain types of cancers if you take this medicine. Both men and women must use effective birth control with this medicine. Do not become pregnant while taking this medicine or until at least 1 normal menstrual cycle has occurred after stopping it. Women should inform their doctor if they wish to become pregnant or think they might be pregnant. Men should not father a child while taking this medicine and for 3 months after stopping it. There is a potential for serious side effects to an unborn child. Talk to your health care professional or pharmacist for more information. Do not breast-feed an infant while taking this medicine. What side effects may I notice from receiving this medicine? Side effects that you should report to your doctor or health care professional as soon as possible: -allergic reactions like skin rash, itching or hives, swelling of the face, lips, or tongue -breathing problems or shortness of breath -diarrhea -dry, nonproductive cough -low blood counts - this medicine may decrease the number of white blood cells, red blood cells and platelets. You may be at increased risk for infections and bleeding. -mouth  sores -redness, blistering, peeling or loosening of the skin, including inside the mouth -signs of infection - fever or chills, cough, sore throat, pain or trouble passing urine -signs and symptoms of bleeding such as bloody or black, tarry stools; red or dark-brown urine; spitting up blood or brown material that looks like coffee grounds; red spots on the skin; unusual bruising or bleeding from the eye, gums, or nose -signs and symptoms of kidney injury like trouble passing urine or change in the amount of urine -signs and symptoms of liver injury like dark yellow or brown urine; general ill feeling or flu-like symptoms; light-colored stools; loss of appetite; nausea; right upper belly pain; unusually weak or tired; yellowing of the eyes or skin Side effects that usually do not require medical attention (report to your doctor or health care professional if they continue or are bothersome): -dizziness -hair loss -tiredness -upset stomach -vomiting This list may not describe all possible side effects. Call your doctor for medical advice about side effects. You may report side effects to FDA at 1-800-FDA-1088. Where should I keep my medicine? Keep out of the reach of children. Store at room temperature between 20 and 25 degrees C (68 and 77 degrees F). Protect from light. Throw away any unused medicine after the expiration date. NOTE: This sheet is a summary. It may not cover all possible information. If you have questions about this medicine, talk to your doctor,  pharmacist, or health care provider.  2018 Elsevier/Gold Standard (2015-04-27 05:39:22)

## 2016-12-20 LAB — COMPLETE METABOLIC PANEL WITH GFR
ALT: 13 U/L (ref 6–29)
AST: 24 U/L (ref 10–35)
Albumin: 3.9 g/dL (ref 3.6–5.1)
Alkaline Phosphatase: 44 U/L (ref 33–115)
BILIRUBIN TOTAL: 0.3 mg/dL (ref 0.2–1.2)
BUN: 11 mg/dL (ref 7–25)
CO2: 20 mmol/L (ref 20–31)
Calcium: 9 mg/dL (ref 8.6–10.2)
Chloride: 104 mmol/L (ref 98–110)
Creat: 0.64 mg/dL (ref 0.50–1.10)
GLUCOSE: 84 mg/dL (ref 65–99)
POTASSIUM: 4.3 mmol/L (ref 3.5–5.3)
SODIUM: 138 mmol/L (ref 135–146)
TOTAL PROTEIN: 8.1 g/dL (ref 6.1–8.1)

## 2016-12-20 LAB — HEPATITIS PANEL, ACUTE
HCV Ab: NEGATIVE
HEP B S AG: NEGATIVE
Hep A IgM: NONREACTIVE
Hep B C IgM: NONREACTIVE

## 2016-12-20 LAB — IGG, IGA, IGM
IGA: 145 mg/dL (ref 81–463)
IGG (IMMUNOGLOBIN G), SERUM: 2669 mg/dL — AB (ref 694–1618)
IgM, Serum: 33 mg/dL — ABNORMAL LOW (ref 48–271)

## 2016-12-20 LAB — CYCLIC CITRUL PEPTIDE ANTIBODY, IGG: Cyclic Citrullin Peptide Ab: <16 Units

## 2016-12-20 LAB — RHEUMATOID FACTOR: Rhuematoid fact SerPl-aCnc: <14 IU/mL (ref <14)

## 2016-12-20 LAB — HIV ANTIBODY (ROUTINE TESTING W REFLEX): HIV 1&2 Ab, 4th Generation: NONREACTIVE

## 2016-12-21 LAB — PROTEIN ELECTROPHORESIS, SERUM, WITH REFLEX
ALPHA-2-GLOBULIN: 0.8 g/dL (ref 0.5–0.9)
Albumin ELP: 4 g/dL (ref 3.8–4.8)
Alpha-1-Globulin: 0.3 g/dL (ref 0.2–0.3)
Beta 2: 0.5 g/dL (ref 0.2–0.5)
Beta Globulin: 0.4 g/dL (ref 0.4–0.6)
GAMMA GLOBULIN: 2.2 g/dL — AB (ref 0.8–1.7)
TOTAL PROTEIN, SERUM ELECTROPHOR: 8.1 g/dL (ref 6.1–8.1)

## 2016-12-21 LAB — QUANTIFERON TB GOLD ASSAY (BLOOD)
Interferon Gamma Release Assay: NEGATIVE
MITOGEN-NIL SO: 9.79 [IU]/mL
QUANTIFERON NIL VALUE: 0.04 [IU]/mL

## 2016-12-22 ENCOUNTER — Telehealth: Payer: Self-pay | Admitting: *Deleted

## 2016-12-22 DIAGNOSIS — R748 Abnormal levels of other serum enzymes: Secondary | ICD-10-CM

## 2016-12-22 NOTE — Telephone Encounter (Signed)
-----   Message from Pollyann Savoy, MD sent at 12/22/2016 12:48 PM EDT ----- Refer to hematology for elevated IgG

## 2016-12-22 NOTE — Progress Notes (Signed)
Refer to hematology for elevated IgG

## 2016-12-22 NOTE — Progress Notes (Signed)
TB neg

## 2016-12-23 ENCOUNTER — Telehealth: Payer: Self-pay | Admitting: Pharmacist

## 2016-12-23 DIAGNOSIS — Z79899 Other long term (current) drug therapy: Secondary | ICD-10-CM

## 2016-12-23 MED ORDER — METHOTREXATE 2.5 MG PO TABS: ORAL_TABLET | ORAL | 0 refills | Status: DC

## 2016-12-23 MED ORDER — FOLIC ACID 1 MG PO TABS: 2.0000 mg | ORAL_TABLET | Freq: Every day | ORAL | 3 refills | Status: DC

## 2016-12-23 NOTE — Telephone Encounter (Signed)
Plan per 12/19/16 office visit, "The plan is to obtain an informed consent today and once the labs are available and we'll start her on methotrexate 4 tablets by mouth every week and increase it every 2 weeks to reach 8 tablets by mouth every week along with folic acid. Labs will be monitored every 2 weeks 3 and then every 2 months to monitor for drug toxicity."    Discussed patient's labs with Dr. Corliss Skains and will continue with initiation of methotrexate as planned.  I called patient to discuss.  There was no answer.  I left a message asking her to call me back.   Lilla Shook, Pharm.D., BCPS, CPP Clinical Pharmacist Pager: [phone] Phone: [phone] 12/23/2016 1:04 PM

## 2016-12-23 NOTE — Telephone Encounter (Signed)
Received return call from patient.  Reviewed plan to initiate methotrexate/folic acid.  Counseled patient on methotrexate/folic acid dosing schedule and standing lab schedule.  Patient voiced understanding and denies any questions or concerns at this time.    Lilla Shook, Pharm.D., BCPS, CPP Clinical Pharmacist Pager: [phone] Phone: [phone] 12/23/2016 1:28 PM

## 2016-12-29 ENCOUNTER — Ambulatory Visit (INDEPENDENT_AMBULATORY_CARE_PROVIDER_SITE_OTHER): Payer: 59 | Admitting: Internal Medicine

## 2016-12-29 ENCOUNTER — Encounter: Payer: Self-pay | Admitting: Internal Medicine

## 2016-12-29 DIAGNOSIS — D869 Sarcoidosis, unspecified: Secondary | ICD-10-CM

## 2016-12-29 NOTE — Progress Notes (Signed)
Patient ID: Nancy Villarreal, female    DOB: December 05, 1970, 46 y.o.   MRN: [id number]  HPI F former smoker followed here with asthmatic bronchitis, sarcoid Stage III,  Bronch NEG 09/24/08- dx based on mediastinal adenopathy/ interstitial disease, response to steroid.   Iron def anemia. Elevated IgG. PFT 10/31/16-  ACE 09/30/16- 86H Immunoglobulins 12/19/16- IgG 2,669 --------------------------------------------------------------------------------------------------  12/29/16- 46 yoF former smoker followed here with asthmatic bronchitis, sarcoid Stage III,  Bronch NEG 09/24/08- dx based on mediastinal adenopathy/ interstitial disease, , response to steroid. Iron def anemia FOLLOWS FOR:Pt states she has slight crackles at night when lying flat-if she lifts her head then the crackles stop. Otherwise doing and feeling well. Dr Deveshwar/ Rheum has started methotrexate. No asthma/bronchitis symptoms since February-not needing rescue inhaler. 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 Started methotrexate 1 week ago-too early to judge effectiveness. Off prednisone. Hand feels some better Denies fever, sweat, rash, adenopathy. No routine cough. ACE 09/30/16- 86H Immunoglobulins 12/19/16- IgG 2,669 CT chest 10/27/16 IMPRESSION: 1. The overall appearance of the thorax is very similar to the prior study, and again compatible with the patient's reported clinical history of sarcoidosis. No acute findings are noted on today's examination.  Review of Systems-see HPI    HEENT:   No headaches,  Difficulty swallowing,  Tooth/dental problems,  Sore throat,                No sneezing, itching, ear ache, nasal congestion, post nasal drip,  CV:  No- chest pain,  No-PND, swelling in lower extremities, anasarca, dizziness, palpitations GI  No heartburn, indigestion, abdominal pain, nausea, vomiting,  Resp:  No acute shortness of breath with exertion .  No excess mucus, no productive cough,   +minor non-productive cough,  No- recent coughing up of blood.  No change in color of mucus.  No wheezing.  Skin: no rash or lesions. GU: MS: +joint pain .  No decreased range of motion.  No back pain. Psych:  No change in mood or affect. No depression or anxiety.  No memory loss.  Objective:   Physical Exam General- Alert, Oriented, Affect-appropriate, Distress- none acute. Looks well.  Skin-  no rash, lesions- none, excoriation- none Lymphadenopathy- none Head- atraumatic            Eyes- Gross vision intact, PERRLA, conjunctivae clear secretions            Ears- Hearing, canals normal            Nose- Clear, No-Septal dev, mucus, polyps, erosion, perforation              Throat- Mallampati II , mucosa clear , drainage- none, tonsils present.                     Neck- flexible , trachea midline, no stridor , thyroid nl, carotid no bruit Chest - symmetrical excursion , unlabored           Heart/CV- RRR , no murmur , no gallop  , no rub, nl s1 s2                           - JVD- none , edema- none, stasis changes- none, varices- none           Lung-   Cough-none, chest clear, unlabored, wheeze- none,  dullness-none, rub- none  Chest wall-  Abd-  Br/ Gen/ Rectal- Not done, not indicated Extrem- cyanosis- none, clubbing, none, atrophy- none, strength- nl Neuro- grossly intact to observation

## 2016-12-29 NOTE — Patient Instructions (Addendum)
Dr Corliss Skains will manage your methotrexate for sarcoid. We will help with your respiratory status.  Your IgG can be rechecked after your sarcoid has been controlled for a couple of months, but probably was elevted due to the sarcoid.  Please call as needed

## 2016-12-30 NOTE — Assessment & Plan Note (Signed)
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 This is probably also the explanation for her elevated IgG. I think for asking hematology for bone marrow, it might be appropriate to recheck these after a month on methotrexate.

## 2017-01-09 ENCOUNTER — Other Ambulatory Visit: Payer: Self-pay

## 2017-01-09 DIAGNOSIS — M79641 Pain in right hand: Secondary | ICD-10-CM

## 2017-01-09 DIAGNOSIS — Z79899 Other long term (current) drug therapy: Secondary | ICD-10-CM

## 2017-01-09 DIAGNOSIS — M79671 Pain in right foot: Secondary | ICD-10-CM

## 2017-01-09 DIAGNOSIS — M25512 Pain in left shoulder: Secondary | ICD-10-CM

## 2017-01-09 DIAGNOSIS — M79642 Pain in left hand: Secondary | ICD-10-CM

## 2017-01-09 DIAGNOSIS — M25572 Pain in left ankle and joints of left foot: Secondary | ICD-10-CM

## 2017-01-09 DIAGNOSIS — D869 Sarcoidosis, unspecified: Secondary | ICD-10-CM

## 2017-01-09 LAB — COMPLETE METABOLIC PANEL WITH GFR
ALBUMIN: 3.6 g/dL (ref 3.6–5.1)
ALT: 12 U/L (ref 6–29)
AST: 23 U/L (ref 10–35)
Alkaline Phosphatase: 36 U/L (ref 33–115)
BUN: 11 mg/dL (ref 7–25)
CO2: 25 mmol/L (ref 20–31)
CREATININE: 0.83 mg/dL (ref 0.50–1.10)
Calcium: 9.1 mg/dL (ref 8.6–10.2)
Chloride: 105 mmol/L (ref 98–110)
GFR, Est African American: >89 mL/min (ref >=60)
GFR, Est Non African American: 84 mL/min (ref >=60)
GLUCOSE: 71 mg/dL (ref 65–99)
Potassium: 4.4 mmol/L (ref 3.5–5.3)
SODIUM: 139 mmol/L (ref 135–146)
TOTAL PROTEIN: 7.5 g/dL (ref 6.1–8.1)
Total Bilirubin: 0.3 mg/dL (ref 0.2–1.2)

## 2017-01-09 LAB — CBC WITH DIFFERENTIAL/PLATELET
BASOS PCT: 0 %
Basophils Absolute: 0 cells/uL (ref 0–200)
EOS ABS: 165 {cells}/uL (ref 15–500)
Eosinophils Relative: 5 %
HCT: 37.3 % (ref 35.0–45.0)
HEMOGLOBIN: 12.2 g/dL (ref 11.7–15.5)
LYMPHS ABS: 1353 {cells}/uL (ref 850–3900)
Lymphocytes Relative: 41 %
MCH: 27.4 pg (ref 27.0–33.0)
MCHC: 32.7 g/dL (ref 32.0–36.0)
MCV: 83.8 fL (ref 80.0–100.0)
MONO ABS: 363 {cells}/uL (ref 200–950)
MONOS PCT: 11 %
MPV: 11.1 fL (ref 7.5–12.5)
NEUTROS ABS: 1419 {cells}/uL — AB (ref 1500–7800)
Neutrophils Relative %: 43 %
PLATELETS: 188 10*3/uL (ref 140–400)
RBC: 4.45 MIL/uL (ref 3.80–5.10)
RDW: 14.7 % (ref 11.0–15.0)
WBC: 3.3 10*3/uL — ABNORMAL LOW (ref 3.8–10.8)

## 2017-01-10 ENCOUNTER — Telehealth: Payer: Self-pay | Admitting: Oncology

## 2017-01-10 ENCOUNTER — Encounter: Payer: Self-pay | Admitting: Oncology

## 2017-01-10 LAB — URIC ACID: Uric Acid, Serum: 6.4 mg/dL (ref 2.5–7.0)

## 2017-01-10 NOTE — Telephone Encounter (Signed)
Appt has been scheduled w/Sharon from Dr. Fatima Sangereveshwar's office for the pt to see Dr. Clelia CroftShadad on 6/7 at 11am. Letter mailed.

## 2017-01-10 NOTE — Progress Notes (Signed)
She should stay on methotrexate 6 tablets by mouth every week

## 2017-01-11 ENCOUNTER — Telehealth: Payer: Self-pay | Admitting: *Deleted

## 2017-01-11 NOTE — Telephone Encounter (Signed)
-----   Message from Pollyann SavoyShaili Deveshwar, MD sent at 01/10/2017 12:26 PM EDT ----- She should stay on methotrexate 6 tablets by mouth every week

## 2017-01-11 NOTE — Telephone Encounter (Signed)
Patient advised of lab results and verbalized understanding. Patient advised to stay on MTX 6 tabs per week.

## 2017-01-13 DIAGNOSIS — Z79899 Other long term (current) drug therapy: Secondary | ICD-10-CM | POA: Insufficient documentation

## 2017-01-13 DIAGNOSIS — M25512 Pain in left shoulder: Secondary | ICD-10-CM | POA: Insufficient documentation

## 2017-01-13 DIAGNOSIS — M79642 Pain in left hand: Secondary | ICD-10-CM

## 2017-01-13 DIAGNOSIS — M79641 Pain in right hand: Secondary | ICD-10-CM | POA: Insufficient documentation

## 2017-01-13 DIAGNOSIS — M25572 Pain in left ankle and joints of left foot: Secondary | ICD-10-CM | POA: Insufficient documentation

## 2017-01-13 NOTE — Progress Notes (Signed)
Office Visit Note  Patient: Nancy Villarreal             Date of Birth: [birth year]           MRN: [id number]             PCP: Etta Grandchild, MD Referring: Etta Grandchild, MD Visit Date: 01/23/2017 Occupation: @GUAROCC @    Subjective:  Lower back pain   History of Present Illness: Nancy Villarreal is a 46 y.o. female history of sarcoidosis some. She states last weekend she developed sciatica symptoms with left-sided radiculopathy. She was seen by her PCP and was given a prednisone taper with muscle relaxers. Her symptoms are better now. She denies any joint swelling. She denies any increased shortness of breath. Her left shoulder pain is improved. She's not having nocturnal pain in her shoulders anymore. Her feet are feeling better as she's been wearing flat shoes. Her hand pain has improved .  Activities of Daily Living:  Patient reports morning stiffness for 0 minute.   Patient Denies nocturnal pain.  Difficulty dressing/grooming: Denies Difficulty climbing stairs: Denies Difficulty getting out of chair: Denies Difficulty using hands for taps, buttons, cutlery, and/or writing: Denies   Review of Systems  Constitutional: Negative for fatigue, night sweats, weight gain, weight loss and weakness.  HENT: Negative for mouth sores, trouble swallowing, trouble swallowing, mouth dryness and nose dryness.   Eyes: Negative for pain, redness, visual disturbance and dryness.  Respiratory: Negative for cough, shortness of breath and difficulty breathing.   Cardiovascular: Negative for chest pain, palpitations, hypertension, irregular heartbeat and swelling in legs/feet.  Gastrointestinal: Negative for blood in stool, constipation and diarrhea.  Endocrine: Negative for increased urination.  Genitourinary: Negative for vaginal dryness.  Musculoskeletal: Negative for arthralgias, joint pain, joint swelling, myalgias, muscle weakness, morning stiffness, muscle tenderness and myalgias.  Skin:  Negative for color change, rash, hair loss, skin tightness, ulcers and sensitivity to sunlight.  Allergic/Immunologic: Negative for susceptible to infections.  Neurological: Negative for dizziness, memory loss and night sweats.  Hematological: Negative for swollen glands.  Psychiatric/Behavioral: Negative for depressed mood and sleep disturbance. The patient is not nervous/anxious.     PMFS History:  Patient Active Problem List   Diagnosis Date Noted  . Bilateral hand pain 01/13/2017  . Pain, joint, shoulder, left 01/13/2017  . Pain in left ankle and joints of left foot 01/13/2017  . High risk medication use 01/13/2017  . Nausea 08/09/2016  . Cough 03/09/2016  . Obesity (BMI 30-39.9) 01/22/2016  . Right foot pain 01/21/2016  . Routine general medical examination at a health care facility 12/11/2013  . Visit for screening mammogram 03/07/2012  . Anemia, iron deficiency 02/10/2012  . Sarcoidosis (HCC) 09/17/2008  . Asthma in remission 09/03/2008  . Allergic rhinitis due to pollen 09/03/2008    Past Medical History:  Diagnosis Date  . Abnormal chest x-ray   . Anemia   . Hemoptysis   . Sarcoid    bronch negative 09-2008 ACE 93  . Unspecified asthma(493.90)     Family History  Problem Relation Age of Onset  . Asthma Paternal Grandfather   . Lung cancer Maternal Grandfather   . Hyperlipidemia Mother   . Hypertension Mother   . Diabetes Father   . Hyperlipidemia Maternal Grandmother   . Stroke Maternal Grandmother   . Cancer Paternal Grandmother   . Alcohol abuse Paternal Grandmother   . Early death Paternal Grandmother    Past Surgical History:  Procedure Laterality Date  . BRONCHOSCOPY  09-2008   Negative  . CESAREAN SECTION    . TUBAL LIGATION     1995   Social History   Social History Narrative  . No narrative on file     Objective: Vital Signs: BP 118/74   Pulse 74   Resp 14   Wt 178 lb (80.7 kg)   LMP 01/06/2017 (Exact Date)   BMI 31.53 kg/m     Physical Exam  Constitutional: She is oriented to person, place, and time. She appears well-developed and well-nourished.  HENT:  Head: Normocephalic and atraumatic.  Eyes: Conjunctivae and EOM are normal.  Neck: Normal range of motion.  Cardiovascular: Normal rate, regular rhythm, normal heart sounds and intact distal pulses.   Pulmonary/Chest: Effort normal and breath sounds normal.  Abdominal: Soft. Bowel sounds are normal.  Lymphadenopathy:    She has no cervical adenopathy.  Neurological: She is alert and oriented to person, place, and time.  Skin: Skin is warm and dry. Capillary refill takes less than 2 seconds.  Psychiatric: She has a normal mood and affect. Her behavior is normal.  Nursing note and vitals reviewed.    Musculoskeletal Exam: C-spine and thoracic lumbar spine good range of motion. Shoulder joints elbow joints wrist joint MCPs PIPs DIPs with good range of motion. Hip joints knee joints ankles MTPs PIPs are good range of motion with no synovitis.  CDAI Exam: No CDAI exam completed.    Investigation: Findings:  12/19/2016 SPEP report The increase in the gamma globulins appears to be polyclonal, suggesting a chronic inflammatory response,  IgG Elevated  2,669, CMP, CCP, Hepatitis panel, HIV, TB gold, RF, and Uric acid all normal.       CBC Latest Ref Rng & Units 01/09/2017 10/06/2016 09/24/2016  WBC 3.8 - 10.8 K/uL 3.3(L) 9.9 4.2(A)  Hemoglobin 11.7 - 15.5 g/dL 40.9 11.5(A) 10.5(A)  Hematocrit 35.0 - 45.0 % 37.3 34.4(A) 30.5(A)  Platelets 140 - 400 K/uL 188 - -   CMP Latest Ref Rng & Units 01/09/2017 12/19/2016 10/06/2016  Glucose 65 - 99 mg/dL 71 84 96  BUN 7 - 25 mg/dL 11 11 13   Creatinine 0.50 - 1.10 mg/dL 8.11 9.14 7.82  Sodium 135 - 146 mmol/L 139 138 136  Potassium 3.5 - 5.3 mmol/L 4.4 4.3 4.0  Chloride 98 - 110 mmol/L 105 104 100  CO2 20 - 31 mmol/L 25 20 23   Calcium 8.6 - 10.2 mg/dL 9.1 9.0 8.9  Total Protein 6.1 - 8.1 g/dL 7.5 8.1 -  Total Bilirubin  0.2 - 1.2 mg/dL 0.3 0.3 -  Alkaline Phos 33 - 115 U/L 36 44 -  AST 10 - 35 U/L 23 24 -  ALT 6 - 29 U/L 12 13 -    Imaging: No results found.  Speciality Comments: No specialty comments available.    Procedures:  No procedures performed Allergies: Hydrocodone   Assessment / Plan:     Visit Diagnoses: Sarcoidosis [facility]): She is doing really well on methotrexate. She states she had recent visit with Dr. Maple Hudson which went fairly well.  High risk medication use: She is on methotrexate 6 tablets by mouth every week I'll keep her at the same dose her most recent labs showed white cell count of 3.3. I would like to repeat it is 1-2 months just to see if it's a stable. If she does well I may reduce her methotrexate in future.  Right foot pain: Doing much better with  proper fitting shoes.  Pain, joint, shoulder, left all resolved  Bilateral hand pain: She denies any stiffness and discomfort at this time.  History of anemia: Has improved  Lower back pain: Patient had recent sciatica symptoms and was treated with steroids and muscle relaxers she is doing better. Cor muscle strengthening exercises weight loss and diet was discussed at length.    Orders: Orders Placed This Encounter  Procedures  . CBC with Differential/Platelet   Meds ordered this encounter  Medications  . methotrexate (RHEUMATREX) 2.5 MG tablet    Sig: Take 6 tabs po weekly    Dispense:  72 tablet    Refill:  0    Face-to-face time spent with patient was 30 minutes. 50% of time was spent in counseling and coordination of care.  Follow-Up Instructions: Return in about 5 months (around 06/25/2017) for Sarcoidosis.   Pollyann SavoyShaili Shakeita Vandevander, MD  Note - This record has been created using [employer] software.  Chart creation errors have been sought, but may not always  have been located. Such creation errors do not reflect on  the standard of medical care.

## 2017-01-14 ENCOUNTER — Encounter: Payer: Self-pay | Admitting: Family Medicine

## 2017-01-14 ENCOUNTER — Ambulatory Visit (INDEPENDENT_AMBULATORY_CARE_PROVIDER_SITE_OTHER): Payer: 59 | Admitting: Family Medicine

## 2017-01-14 VITALS — BP 106/70 | HR 94 | Temp 99.5°F | Resp 17 | Ht 63.0 in | Wt 169.1 lb

## 2017-01-14 DIAGNOSIS — M5442 Lumbago with sciatica, left side: Secondary | ICD-10-CM | POA: Diagnosis not present

## 2017-01-14 MED ORDER — PREDNISONE 20 MG PO TABS: ORAL_TABLET | ORAL | 0 refills | Status: DC

## 2017-01-14 MED ORDER — CYCLOBENZAPRINE HCL 10 MG PO TABS: 10.0000 mg | ORAL_TABLET | Freq: Three times a day (TID) | ORAL | 1 refills | Status: DC | PRN

## 2017-01-14 MED ORDER — METHYLPREDNISOLONE SODIUM SUCC 125 MG IJ SOLR
125.0000 mg | Freq: Once | INTRAMUSCULAR | Status: AC
  Administered 2017-01-14: 125 mg via INTRAMUSCULAR

## 2017-01-14 NOTE — Patient Instructions (Addendum)
when you are at home, take a muscle relaxant cyclobenzaprine followed by 15 minutes of heat followed by gentle stretching. Try to do this regiment 2 - 3 times a day.  If you are still having pain in 4 to 6 wks, come back to clinic for further eval. RTC immed if symptoms worsen or you develop any other concerning symptoms below.  Meds ordered this encounter  Medications  . methylPREDNISolone sodium succinate (SOLU-MEDROL) 125 mg/2 mL injection 125 mg  . cyclobenzaprine (FLEXERIL) 10 MG tablet    Sig: Take 1 tablet (10 mg total) by mouth 3 (three) times daily as needed for muscle spasms.    Dispense:  30 tablet    Refill:  1  . predniSONE (DELTASONE) 20 MG tablet    Sig: 60mg  po x 2d, 40mg  po x 2d, 20mg  x 2d, then stop    Dispense:  12 tablet    Refill:  0      IF you received an x-ray today, you will receive an invoice from [facility]. Please contact [facility] at [phone] with questions or concerns regarding your invoice.   IF you received labwork today, you will receive an invoice from [facility]. Please contact [facility] at [phone] with questions or concerns regarding your invoice.   Our billing staff will not be able to assist you with questions regarding bills from these companies.  You will be contacted with the lab results as soon as they are available. The fastest way to get your results is to activate your My Chart account. Instructions are located on the last page of this paperwork. If you have not heard from us regarding the results in 2 weeks, please contact this office.     Sciatica Sciatica is pain, numbness, weakness, or tingling along the path of the sciatic nerve. The sciatic nerve starts in the lower back and runs down the back of each leg. The nerve controls the muscles in the lower leg and in the back of the knee. It also provides feeling (sensation) to the back of the thigh, the lower leg, and the sole of the foot. Sciatica is a symptom of  another medical condition that pinches or puts pressure on the sciatic nerve. Generally, sciatica only affects one side of the body. Sciatica usually goes away on its own or with treatment. In some cases, sciatica may keep coming back (recur). What are the causes? This condition is caused by pressure on the sciatic nerve, or pinching of the sciatic nerve. This may be the result of:  A disk in between the bones of the spine (vertebrae) bulging out too far (herniated disk).  Age-related changes in the spinal disks (degenerative disk disease).  A pain disorder that affects a muscle in the buttock (piriformis syndrome).  Extra bone growth (bone spur) near the sciatic nerve.  An injury or break (fracture) of the pelvis.  Pregnancy.  Tumor (rare). What increases the risk? The following factors may make you more likely to develop this condition:  Playing sports that place pressure or stress on the spine, such as football or weight lifting.  Having poor strength and flexibility.  A history of back injury.  A history of back surgery.  Sitting for long periods of time.  Doing activities that involve repetitive bending or lifting.  Obesity. What are the signs or symptoms? Symptoms can vary from mild to very severe, and they may include:  Any of these problems in the lower back, leg, hip, or buttock:  Mild tingling or dull aches.  Burning sensations.  Sharp pains.  Numbness in the back of the calf or the sole of the foot.  Leg weakness.  Severe back pain that makes movement difficult. These symptoms may get worse when you cough, sneeze, or laugh, or when you sit or stand for long periods of time. Being overweight may also make symptoms worse. In some cases, symptoms may recur over time. How is this diagnosed? This condition may be diagnosed based on:  Your symptoms.  A physical exam. Your health care provider may ask you to do certain movements to check whether those  movements trigger your symptoms.  You may have tests, including:  Blood tests.  X-rays.  MRI.  CT scan. How is this treated? In many cases, this condition improves on its own, without any treatment. However, treatment may include:  Reducing or modifying physical activity during periods of pain.  Exercising and stretching to strengthen your abdomen and improve the flexibility of your spine.  Icing and applying heat to the affected area.  Medicines that help:  To relieve pain and swelling.  To relax your muscles.  Injections of medicines that help to relieve pain, irritation, and inflammation around the sciatic nerve (steroids).  Surgery. Follow these instructions at home: Medicines   Take over-the-counter and prescription medicines only as told by your health care provider.  Do not drive or operate heavy machinery while taking prescription pain medicine. Managing pain   If directed, apply ice to the affected area.  Put ice in a plastic bag.  Place a towel between your skin and the bag.  Leave the ice on for 20 minutes, 2-3 times a day.  After icing, apply heat to the affected area before you exercise or as often as told by your health care provider. Use the heat source that your health care provider recommends, such as a moist heat pack or a heating pad.  Place a towel between your skin and the heat source.  Leave the heat on for 20-30 minutes.  Remove the heat if your skin turns bright red. This is especially important if you are unable to feel pain, heat, or cold. You may have a greater risk of getting burned. Activity   Return to your normal activities as told by your health care provider. Ask your health care provider what activities are safe for you.  Avoid activities that make your symptoms worse.  Take brief periods of rest throughout the day. Resting in a lying or standing position is usually better than sitting to rest.  When you rest for longer  periods, mix in some mild activity or stretching between periods of rest. This will help to prevent stiffness and pain.  Avoid sitting for long periods of time without moving. Get up and move around at least one time each hour.  Exercise and stretch regularly, as told by your health care provider.  Do not lift anything that is heavier than 10 lb (4.5 kg) while you have symptoms of sciatica. When you do not have symptoms, you should still avoid heavy lifting, especially repetitive heavy lifting.  When you lift objects, always use proper lifting technique, which includes:  Bending your knees.  Keeping the load close to your body.  Avoiding twisting. General instructions   Use good posture.  Avoid leaning forward while sitting.  Avoid hunching over while standing.  Maintain a healthy weight. Excess weight puts extra stress on your back and makes it difficult to  maintain good posture.  Wear supportive, comfortable shoes. Avoid wearing high heels.  Avoid sleeping on a mattress that is too soft or too hard. A mattress that is firm enough to support your back when you sleep may help to reduce your pain.  Keep all follow-up visits as told by your health care provider. This is important. Contact a health care provider if:  You have pain that wakes you up when you are sleeping.  You have pain that gets worse when you lie down.  Your pain is worse than you have experienced in the past.  Your pain lasts longer than 4 weeks.  You experience unexplained weight loss. Get help right away if:  You lose control of your bowel or bladder (incontinence).  You have:  Weakness in your lower back, pelvis, buttocks, or legs that gets worse.  Redness or swelling of your back.  A burning sensation when you urinate. This information is not intended to replace advice given to you by your health care provider. Make sure you discuss any questions you have with your health care  provider. Document Released: 08/16/2001 Document Revised: 01/26/2016 Document Reviewed: 05/01/2015 Elsevier Interactive Patient Education  2017 [employer]  Spondylolysis Rehab Ask your health care provider which exercises are safe for you. Do exercises exactly as told by your health care provider and adjust them as directed. It is normal to feel mild stretching, pulling, tightness, or discomfort as you do these exercises, but you should stop right away if you feel sudden pain or your pain gets worse. Do not begin these exercises until told by your health care provider. Stretching and range of motion exercises These exercises warm up your muscles and joints and improve the movement and flexibility of your hips and your back. These exercises may also help to relieve pain, numbness, and tingling. Exercise A: Single knee to chest   1. Lie on your back on a firm surface with both legs straight. 2. Bend one of your knees. Use your hands to move your knee up toward your chest until you feel a gentle stretch in your lower back and buttock.  Hold your leg in this position by holding onto the front of your knee.  Keep your other leg as straight as possible. 3. Hold for __________ seconds. 4. Slowly return to the starting position. 5. Repeat this exercise with your other leg. Repeat __________ times. Complete this exercise __________ times a day. Exercise B: Hamstring stretch, supine   1. Lie on your back. 2. Hold both ends of a belt or towel as you loop it over the ball of one of your feet. The ball of your foot is on the walking surface, right under your toes. 3. Straighten your knee and slowly pull on the belt to raise your leg.  Do not let your knee bend while you do this.  Keep your other leg flat on the floor.  Raise the leg until you feel a gentle stretch in the back of your knee or thigh. 4. Hold for __________ seconds. 5. If told by your health care provider, repeat this exercise  with your other leg. Repeat __________ times. Complete this exercise __________ times a day. Strengthening exercises These exercises build strength and endurance in your back. Endurance is the ability to use your muscles for a long time, even after they get tired. Exercise C: Pelvic tilt  1. Lie on your back on a firm bed or the floor. Bend your knees and keep  your feet flat. 2. Tense your abdominal muscles. Tip your pelvis up toward the ceiling and flatten your lower back into the floor.  To help with this exercise, you may place a small towel under your lower back and try to push your back into the towel. 3. Hold for __________ seconds. 4. Let your muscles relax completely before you repeat this exercise. Repeat __________ times. Complete this exercise __________ times a day. Exercise D: Abdominal crunch   1. Lie on your back on a firm surface. Bend your knees and keep your feet flat. Cross your arms over your chest. 2. Tuck your chin down toward your chest, without bending your neck. 3. Use your abdominal muscles to lift your upper body off of the ground, straight up into the air.  Try to lift yourself until your shoulder blades are off the ground. You may need to work up to this.  Keep your lower back on the ground while you crunch upward.  Do not hold your breath. 4. Slowly lower yourself down. Keep your abdominal muscles tense until you are back to the starting position. Repeat __________ times. Complete this exercise __________ times a day. Exercise E: Alternating arm and leg raises   1. Get on your hands and knees on a firm surface. If you are on a hard floor, you may want to use padding to cushion your knees, such as an exercise mat. 2. Line up your arms and legs. Your hands should be below your shoulders, and your knees should be below your hips. 3. Lift your left leg behind you. At the same time, raise your right arm and straighten it in front of you.  Do not lift your leg  higher than your hip.  Do not lift your arm higher than your shoulder.  Keep your abdominal and back muscles tight.  Keep your hips facing the ground.  Do not arch your back.  Keep your balance carefully, and do not hold your breath. 4. Hold for __________ seconds. 5. Slowly return to the starting position and repeat with your right leg and your left arm. Repeat __________ times. Complete this exercise __________ times a day. Posture and body mechanics Body mechanics refers to the movements and positions of your body while you do your daily activities. Posture is part of body mechanics. Good posture and healthy body mechanics can help to relieve stress in your body's tissues and joints. Good posture means that your spine is in its natural S-curve position (your spine is neutral), your shoulders are pulled back slightly, and your head is not tipped forward. The following are general guidelines for applying improved posture and body mechanics to your everyday activities. Standing    When standing, keep your spine neutral and your feet about hip-width apart. Keep a slight bend in your knees. Your ears, shoulders, and hips should line up with each other.  When you do a task in which you stand in one place for a long time, place one foot up on a stable object that is 2-4 inches (5-10 cm) high, such as a footstool. This helps keep your spine neutral. Sitting    When sitting, keep your spine neutral and keep your feet flat on the floor. Use a footrest, if necessary, and keep your thighs parallel to the floor. Avoid rounding your shoulders, and avoid tilting your head forward.  When working at a desk or a computer, keep your desk at a height where your hands are slightly lower than  your elbows. Slide your chair under your desk so you are close enough to maintain good posture.  When working at a computer, place your monitor at a height where you are looking straight ahead and you do not have to  tilt your head forward or downward to look at the screen. Resting   When lying down and resting, avoid positions that are most painful for you.  If you have pain with activities such as sitting, bending, stooping, or squatting (flexion-based activities), lie in a position in which your body does not bend very much. For example, avoid curling up on your side with your arms and knees near your chest (fetal position).  If you have pain with activities such as standing for a long time or reaching with your arms (extension-based activities), lie with your spine in a neutral position and bend your knees slightly. Try the following positions:  Lying on your side with a pillow between your knees.  Lying on your back with a pillow under your knees. Lifting    When lifting objects, keep your feet at least shoulder-width apart and tighten your abdominal muscles.  Bend your knees and hips and keep your spine neutral. It is important to lift using the strength of your legs, not your back. Do not lock your knees straight out.  Always ask for help to lift heavy or awkward objects. This information is not intended to replace advice given to you by your health care provider. Make sure you discuss any questions you have with your health care provider. Document Released: 08/22/2005 Document Revised: 04/28/2016 Document Reviewed: 06/02/2015 Elsevier Interactive Patient Education  2017 [employer].

## 2017-01-14 NOTE — Progress Notes (Signed)
Subjective:  By signing my name below, I, Essence Howell, attest that this documentation has been prepared under the direction and in the presence of Norberto SorensonEva Laura Caldas, MD Electronically Signed: Charline BillsEssence Howell, ED Scribe 01/14/2017 at 2:10 PM.   Patient ID: Nancy Villarreal, female    DOB: [birth year], 46 y.o.   MRN: [id number]  Chief Complaint  Patient presents with  . Sciatica    left side since yesterday   HPI Nancy Villarreal is a 46 y.o. female who presents to Primary Care at [facility] complaining of gradually worsening left-sided back pain that radiates into the left poster leg and into the mid calf onset yesterday morning. No known injury. She reports increased pain with ambulating and palpation. Pt is currently taking methotrexate for Sarcoidosis so she is only able to take Tylenol. She has taken 500 mg 4 times today without significant relief. Pt denies numbness, weakness, changes in bowels/bladder, bladder/bowel incontinence, fever, chills.  Past Medical History:  Diagnosis Date  . Abnormal chest x-ray   . Anemia   . Hemoptysis   . Sarcoid    bronch negative 09-2008 ACE 93  . Unspecified asthma(493.90)    Current Outpatient Prescriptions on File Prior to Visit  Medication Sig Dispense Refill  . Albuterol Sulfate (PROAIR RESPICLICK) 108 (90 Base) MCG/ACT AEPB Inhale 2 puffs into the lungs every 4 (four) hours as needed. 1 each 2  . ferrous fumarate-iron polysaccharide complex (TANDEM) 162-115.2 MG CAPS capsule Take 1 capsule by mouth daily with breakfast. 30 capsule 11  . folic acid (FOLVITE) 1 MG tablet Take 2 tablets (2 mg total) by mouth daily. 180 tablet 3  . methotrexate (RHEUMATREX) 2.5 MG tablet Take 4 tabs PO weekly for 2 weeks, increase to 6 tabs PO weekly for 2 weeks if labs stable, then increase to 8 tabs PO weekly if labs stable (Patient taking differently: Take 6 tabs po weekly) 36 tablet 0  . Multiple Vitamin (MULTIVITAMIN) tablet Take 1 tablet by mouth daily.    . ranitidine  (ZANTAC) 150 MG capsule Take 150 mg by mouth daily as needed for heartburn.     No current facility-administered medications on file prior to visit.    Allergies  Allergen Reactions  . Hydrocodone Nausea And Vomiting   Review of Systems  Constitutional: Negative for chills and fever.  Gastrointestinal: Negative for constipation.  Genitourinary: Negative for difficulty urinating and hematuria.  Musculoskeletal: Positive for back pain.  Neurological: Negative for weakness and numbness.      Objective:   Physical Exam  Constitutional: She is oriented to person, place, and time. She appears well-developed and well-nourished. She does not appear ill. She appears distressed (obviously in significant pain, uncomfortable sitting, difficulty with position change and ambulation.).  HENT:  Head: Normocephalic and atraumatic.  Eyes: Conjunctivae and EOM are normal.  Neck: Neck supple. No tracheal deviation present.  Cardiovascular: Normal rate.   Pulmonary/Chest: Effort normal. No respiratory distress.  Musculoskeletal: Normal range of motion.  No spinous process tenderness of the lumbar. R lumbar paraspinous muscles normal. L: tender with swelling over the upper. No CVA tenderness. No SI joint tenderness. No tenderness over trochanteric bursa.  Neurological: She is alert and oriented to person, place, and time.  Antalgic gait. Difficulty with position change. Acute back pain with standing from seated position. Unable to elicit patellar and achilles reflexes even with muscle distraction except on initial attempt with 2+ patellar on L.  Skin: Skin is warm and dry.  Psychiatric:  She has a normal mood and affect. Her behavior is normal.  Nursing note and vitals reviewed.   BP 106/70   Pulse 94   Temp 99.5 F (37.5 C) (Oral)   Resp 17   Ht 5\' 3"  (1.6 m)   Wt 169 lb 2 oz (76.7 kg)   LMP 01/06/2017 (Exact Date)   SpO2 98%   BMI 29.96 kg/m     Assessment & Plan:   1. Acute left-sided low  back pain with left-sided sciatica   On methotrexate so was told to avoid nsaids, using otc apap only.  In sig pain, having trouble moving even minimally, pain with sitting. Known h/o mild lumbar deg disease sev yrs ago - if sxs do not improve and resolve within 4-6 wks, RTC for imaging.  Meds ordered this encounter  Medications  . methylPREDNISolone sodium succinate (SOLU-MEDROL) 125 mg/2 mL injection 125 mg  . cyclobenzaprine (FLEXERIL) 10 MG tablet    Sig: Take 1 tablet (10 mg total) by mouth 3 (three) times daily as needed for muscle spasms.    Dispense:  30 tablet    Refill:  1  . predniSONE (DELTASONE) 20 MG tablet    Sig: 60mg  po x 2d, 40mg  po x 2d, 20mg  x 2d, then stop    Dispense:  12 tablet    Refill:  0    I personally performed the services described in this documentation, which was scribed in my presence. The recorded information has been reviewed and considered, and addended by me as needed.   Norberto Sorenson, M.D.  Primary Care at [facility] [address] [place], [place] [address] [phone] phone [phone] fax  01/16/17 9:17 PM

## 2017-01-18 NOTE — Telephone Encounter (Signed)
Error

## 2017-01-19 ENCOUNTER — Ambulatory Visit: Payer: 59 | Admitting: Rheumatology

## 2017-01-19 ENCOUNTER — Encounter: Payer: Self-pay | Admitting: Oncology

## 2017-01-19 ENCOUNTER — Telehealth: Payer: Self-pay | Admitting: Oncology

## 2017-01-19 ENCOUNTER — Telehealth: Payer: Self-pay | Admitting: *Deleted

## 2017-01-19 NOTE — Telephone Encounter (Signed)
Patient left a message stating her PCP gave her a prescription for Prednisone. Patient wants to make sure she can take her MTX with the Prednisone. Patient the PCP was unclear as to whether she could or not. Patient advised okay to take them together. Patient verbalized understanding.

## 2017-01-19 NOTE — Telephone Encounter (Signed)
Pt cld to reschedule appt w/ Dr. Clelia CroftShadad due to her insurance ending on 5/31. Appt has been rescheduled to 7/17. Will mail a new letter to the pt.

## 2017-01-23 ENCOUNTER — Ambulatory Visit (INDEPENDENT_AMBULATORY_CARE_PROVIDER_SITE_OTHER): Payer: 59 | Admitting: Rheumatology

## 2017-01-23 ENCOUNTER — Encounter: Payer: Self-pay | Admitting: Rheumatology

## 2017-01-23 VITALS — BP 118/74 | HR 74 | Resp 14 | Wt 178.0 lb

## 2017-01-23 DIAGNOSIS — M79642 Pain in left hand: Secondary | ICD-10-CM

## 2017-01-23 DIAGNOSIS — D869 Sarcoidosis, unspecified: Secondary | ICD-10-CM

## 2017-01-23 DIAGNOSIS — M79671 Pain in right foot: Secondary | ICD-10-CM | POA: Diagnosis not present

## 2017-01-23 DIAGNOSIS — M25572 Pain in left ankle and joints of left foot: Secondary | ICD-10-CM

## 2017-01-23 DIAGNOSIS — G8929 Other chronic pain: Secondary | ICD-10-CM

## 2017-01-23 DIAGNOSIS — Z79899 Other long term (current) drug therapy: Secondary | ICD-10-CM

## 2017-01-23 DIAGNOSIS — M5442 Lumbago with sciatica, left side: Secondary | ICD-10-CM

## 2017-01-23 DIAGNOSIS — M79641 Pain in right hand: Secondary | ICD-10-CM | POA: Diagnosis not present

## 2017-01-23 DIAGNOSIS — Z862 Personal history of diseases of the blood and blood-forming organs and certain disorders involving the immune mechanism: Secondary | ICD-10-CM

## 2017-01-23 MED ORDER — METHOTREXATE 2.5 MG PO TABS: ORAL_TABLET | ORAL | 0 refills | Status: DC

## 2017-01-23 NOTE — Patient Instructions (Addendum)
Standing Labs We placed an order today for your standing lab work.     CBC in 1-2  month  Please come back and get your standing labs in August and every 3 months  We have open lab Monday through Friday from 8:30-11:30 AM and 1:30-4 PM at the office of Dr. Arbutus PedShaili Addisen Chappelle/Naitik Panwala, PA.   The office is located at [address], [address], [place], [place] [id number] No appointment is necessary.   Labs are drawn by [facility].  You may receive a bill from [facility] for your lab work.

## 2017-02-09 ENCOUNTER — Encounter: Payer: 59 | Admitting: Oncology

## 2017-03-21 ENCOUNTER — Ambulatory Visit (HOSPITAL_BASED_OUTPATIENT_CLINIC_OR_DEPARTMENT_OTHER): Payer: Managed Care, Other (non HMO) | Admitting: Oncology

## 2017-03-21 ENCOUNTER — Telehealth: Payer: Self-pay | Admitting: Oncology

## 2017-03-21 VITALS — BP 104/73 | HR 72 | Temp 98.7°F | Resp 18 | Ht 63.0 in | Wt 180.4 lb

## 2017-03-21 DIAGNOSIS — Z801 Family history of malignant neoplasm of trachea, bronchus and lung: Secondary | ICD-10-CM | POA: Diagnosis not present

## 2017-03-21 DIAGNOSIS — D729 Disorder of white blood cells, unspecified: Secondary | ICD-10-CM

## 2017-03-21 DIAGNOSIS — Z87891 Personal history of nicotine dependence: Secondary | ICD-10-CM | POA: Diagnosis not present

## 2017-03-21 DIAGNOSIS — Z809 Family history of malignant neoplasm, unspecified: Secondary | ICD-10-CM | POA: Diagnosis not present

## 2017-03-21 NOTE — Progress Notes (Signed)
Reason for Referral: Plasma cell disorder evaluation.   HPI: 46 year old woman native of [place] but currently resides in this area for the last 12 years. She has been diagnosed with sarcoidosis and had been treated periodically with prednisone. She also has reported recurrent flareups with joint swelling and tenderness. Her flare ups are predominantly in the hands and knees and had been started on methotrexate on a weekly basis by Dr. Estanislado Pandy. She tolerates the methotrexate reasonably well without delayed complications. Her laboratory data obtained in April 2018 showed an elevated IgG level of 2600. The immunofixation showed increase in gammaglobulins that appears to be polyclonal suggestive of inflammatory process. Her IgA level was normal and IgM slightly depressed. Her chemistries including liver function tests, albumin, total protein and creatinine all within normal range. She has a normal CBC as well with a slight decrease in her total white cell count. She has had no specific symptoms currently. She denied any weight loss or appetite changes. Her performance status activity level is at baseline.  She is not report any headaches, blurry vision, syncope or seizures. She does not report any fevers, chills or sweats. She is not report any cough, wheezing or hemoptysis. She does not report any nausea, vomiting or abdominal pain. She is not report any frequency urgency or hesitancy. She does not report any skeletal complaints. Remaining review of systems unremarkable   Past Medical History:  Diagnosis Date  . Abnormal chest x-ray   . Anemia   . Hemoptysis   . Sarcoid    bronch negative 09-2008 ACE 93  . Unspecified asthma(493.90)   :  Past Surgical History:  Procedure Laterality Date  . BRONCHOSCOPY  09-2008   Negative  . CESAREAN SECTION    . TUBAL LIGATION     1995  :   Current Outpatient Prescriptions:  .  ferrous fumarate-iron polysaccharide complex (TANDEM) 162-115.2 MG  CAPS capsule, Take 1 capsule by mouth daily with breakfast., Disp: 30 capsule, Rfl: 11 .  folic acid (FOLVITE) 1 MG tablet, Take 2 tablets (2 mg total) by mouth daily., Disp: 180 tablet, Rfl: 3 .  methotrexate (RHEUMATREX) 2.5 MG tablet, Take 6 tabs po weekly, Disp: 72 tablet, Rfl: 0 .  Multiple Vitamin (MULTIVITAMIN) tablet, Take 1 tablet by mouth daily., Disp: , Rfl:  .  ranitidine (ZANTAC) 150 MG capsule, Take 150 mg by mouth daily as needed for heartburn., Disp: , Rfl:  .  Albuterol Sulfate (PROAIR RESPICLICK) 568 (90 Base) MCG/ACT AEPB, Inhale 2 puffs into the lungs every 4 (four) hours as needed. (Patient not taking: Reported on 03/21/2017), Disp: 1 each, Rfl: 2 .  cyclobenzaprine (FLEXERIL) 10 MG tablet, Take 1 tablet (10 mg total) by mouth 3 (three) times daily as needed for muscle spasms. (Patient not taking: Reported on 03/21/2017), Disp: 30 tablet, Rfl: 1:  Allergies  Allergen Reactions  . Hydrocodone Nausea And Vomiting  :  Family History  Problem Relation Age of Onset  . Asthma Paternal Grandfather   . Lung cancer Maternal Grandfather   . Hyperlipidemia Mother   . Hypertension Mother   . Diabetes Father   . Hyperlipidemia Maternal Grandmother   . Stroke Maternal Grandmother   . Cancer Paternal Grandmother   . Alcohol abuse Paternal Grandmother   . Early death Paternal Grandmother   :  Social History   Social History  . Marital status: Legally Separated    Spouse name: N/A  . Number of children: N/A  . Years of education:  N/A   Occupational History  . Geophysicist/field seismologist    Social History Main Topics  . Smoking status: Former Smoker    Types: Cigarettes    Quit date: 08/05/2008  . Smokeless tobacco: Never Used  . Alcohol use No  . Drug use: No  . Sexual activity: Not Currently    Birth control/ protection: Surgical   Other Topics Concern  . Not on file   Social History Narrative  . No narrative on file  :  Pertinent items are noted in HPI.  Exam: Blood  pressure 104/73, pulse 72, temperature 98.7 F (37.1 C), temperature source Oral, resp. rate 18, height 5' 3"  (1.6 m), weight 180 lb 6.4 oz (81.8 kg), SpO2 100 %. General appearance: alert and cooperative Throat: lips, mucosa, and tongue normal; teeth and gums normal Neck: no adenopathy Back: negative Resp: clear to auscultation bilaterally Chest wall: no tenderness Cardio: regular rate and rhythm, S1, S2 normal, no murmur, click, rub or gallop GI: soft, non-tender; bowel sounds normal; no masses,  no organomegaly Extremities: extremities normal, atraumatic, no cyanosis or edema Skin: Skin color, texture, turgor normal. No rashes or lesions Lymph nodes: Cervical, supraclavicular, and axillary nodes normal.  CBC    Component Value Date/Time   WBC 3.3 (L) 01/09/2017 0900   RBC 4.45 01/09/2017 0900   HGB 12.2 01/09/2017 0900   HCT 37.3 01/09/2017 0900   PLT 188 01/09/2017 0900   MCV 83.8 01/09/2017 0900   MCV 78.5 (A) 10/06/2016 0849   MCH 27.4 01/09/2017 0900   MCHC 32.7 01/09/2017 0900   RDW 14.7 01/09/2017 0900   LYMPHSABS 1,353 01/09/2017 0900   MONOABS 363 01/09/2017 0900   EOSABS 165 01/09/2017 0900   BASOSABS 0 01/09/2017 0900     Chemistry      Component Value Date/Time   NA 139 01/09/2017 0900   NA 136 10/06/2016 0840   K 4.4 01/09/2017 0900   CL 105 01/09/2017 0900   CO2 25 01/09/2017 0900   BUN 11 01/09/2017 0900   BUN 13 10/06/2016 0840   CREATININE 0.83 01/09/2017 0900      Component Value Date/Time   CALCIUM 9.1 01/09/2017 0900   ALKPHOS 36 01/09/2017 0900   AST 23 01/09/2017 0900   ALT 12 01/09/2017 0900   BILITOT 0.3 01/09/2017 0900     Comment: The increase in the gamma globulins appears to be polyclonal,  suggesting a chronic inflammatory response.   Assessment and Plan:   46 year old woman with the following issues:  1. Elevated IgG level of 2669 obtained on 12/19/2016. Her immunofixation showed a polyclonal finding without any evidence of an  M spike. Her hemoglobin, chemistries and kidney function all within normal range. These findings in the setting of history of sarcoidosis.  The differential diagnosis for these findings were discussed with the patient today. These findings appears to be reactive related to chronic inflammatory conditions. Autoimmune disorder could also be contributing to these findings. Primary plasma cell disorder is considered less likely. Condition such as multiple myeloma, MGUS, amyloidosis are considered less likely given the polyclonal nature of her elevated immunoglobulins. She has no evidence to suggest end organ damage due to plasma cell disorder.  From a management standpoint, I recommended observation and surveillance. I will repeat serum protein electrophoresis, quantitative immunoglobulins and serum light chains in 6 months. If these findings continue to suggest polyclonal findings, no further workup or follow-up is needed.  2. Follow-up will be in 6 months.

## 2017-03-21 NOTE — Telephone Encounter (Signed)
Gave patient avs report and appointments for January 2019.  °

## 2017-06-08 ENCOUNTER — Ambulatory Visit (INDEPENDENT_AMBULATORY_CARE_PROVIDER_SITE_OTHER)
Admission: RE | Admit: 2017-06-08 | Discharge: 2017-06-08 | Disposition: A | Discharge status: Home / Self Care | Payer: Managed Care, Other (non HMO) | Source: Ambulatory Visit | Attending: Internal Medicine | Admitting: Internal Medicine

## 2017-06-08 ENCOUNTER — Encounter: Payer: Self-pay | Admitting: Internal Medicine

## 2017-06-08 ENCOUNTER — Ambulatory Visit (INDEPENDENT_AMBULATORY_CARE_PROVIDER_SITE_OTHER): Payer: Managed Care, Other (non HMO) | Admitting: Internal Medicine

## 2017-06-08 ENCOUNTER — Other Ambulatory Visit: Payer: Managed Care, Other (non HMO)

## 2017-06-08 VITALS — BP 114/66 | HR 88 | Ht 63.0 in | Wt 180.0 lb

## 2017-06-08 DIAGNOSIS — Z79899 Other long term (current) drug therapy: Secondary | ICD-10-CM

## 2017-06-08 DIAGNOSIS — J069 Acute upper respiratory infection, unspecified: Secondary | ICD-10-CM

## 2017-06-08 DIAGNOSIS — D869 Sarcoidosis, unspecified: Secondary | ICD-10-CM

## 2017-06-08 NOTE — Progress Notes (Signed)
Patient ID: Nancy Villarreal, female    DOB: [birth year], 46 y.o.   MRN: [id number]  HPI F former smoker followed here with sarcoid Stage III,  Bronch NEG 09/24/08- dx based on mediastinal adenopathy/ interstitial disease,chronic elev ACE , response to steroid. Complicated by Iron def anemia, plasma cell disorder. Bronch NEG 09/24/08- dx based on mediastinal adenopathy/ interstitial disease, ACE 90, response to steroids. PFT 10/11/16- --------------------------------------------------------------------------------------------------------- 12/29/16- 46 yoF former smoker followed here with asthmatic bronchitis, sarcoid Stage III,  Bronch NEG 09/24/08- dx based on mediastinal adenopathy/ interstitial disease, , response to steroid. Iron def anemia FOLLOWS FOR:Pt states she has slight crackles at night when lying flat-if she lifts her head then the crackles stop. Otherwise doing and feeling well. Dr Deveshwar/ Rheum has started methotrexate. No asthma/bronchitis symptoms since February-not needing rescue inhaler. 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 Started methotrexate 1 week ago-too early to judge effectiveness. Off prednisone. Hand feels some better Denies fever, sweat, rash, adenopathy. No routine cough. ACE 09/30/16- 86H Immunoglobulins 12/19/16- IgG 2,669 CT chest 10/27/16 IMPRESSION: 1. The overall appearance of the thorax is very similar to the prior study, and again compatible with the patient's reported clinical history of sarcoidosis. No acute findings are noted on today's examination.  06/08/17- 63 yoF former smoker followed here with sarcoid Stage III,  Bronch NEG 09/24/08- dx based on mediastinal adenopathy/ interstitial disease, chronic elevation of ACE , response to steroid. Complicated by Iron def anemia, plasma cell disorder FOLLOWS FOR: Sarcoidosis.  Pt has been doing well until this morning- c/o sore throat, sinus congestion, sometimes prod cough with  yellow/red mucus.   ProAir, MTX 2.5 mg- 6 tabs weekly/ folic acid In the last few days she's had sore throat, chest congestion, no fever or purulent sputum. Treating herself with DayQuil. Had dropped off methotrexate 1 month ago. Never needs rescue inhaler. CT chest 10/27/16 IMPRESSION: 1. The overall appearance of the thorax is very similar to the prior study, and again compatible with the patient's reported clinical history of sarcoidosis. No acute findings are noted on today's examination.  Review of Systems-see HPI   + = positive HEENT:   No headaches,  Difficulty swallowing,  Tooth/dental problems, + Sore throat,                No sneezing, itching, ear ache, nasal congestion, post nasal drip,  CV:  No- chest pain,  No-PND, swelling in lower extremities, anasarca, dizziness, palpitations GI  No heartburn, indigestion, abdominal pain, nausea, vomiting,  Resp:  No acute shortness of breath with exertion .  No excess mucus, no productive cough,                 + non-productive cough,  No- recent coughing up of blood.  No change in color of mucus.  No wheezing.  Skin: no rash or lesions. GU: MS: joint pain .  No decreased range of motion.  No back pain. Psych:  No change in mood or affect. No depression or anxiety.  No memory loss.  Objective:   Physical Exam General- Alert, Oriented, Affect-appropriate, Distress- none acute. Skin-  no rash, lesions- none, excoriation- none Lymphadenopathy- none Head- atraumatic            Eyes- Gross vision intact, PERRLA, conjunctivae clear secretions            Ears- Hearing, canals normal            Nose- Clear, No-Septal  dev, mucus, polyps, erosion, perforation             Throat- Mallampati II , mucosa+ Red , drainage- none, tonsils present. + Hoarse                    Neck- flexible , trachea midline, no stridor , thyroid nl, carotid no bruit Chest - symmetrical excursion , unlabored           Heart/CV- RRR , no murmur , no gallop  , no rub,  nl s1 s2                           - JVD- none , edema- none, stasis changes- none, varices- none           Lung-   Cough-none, chest clear, unlabored, wheeze- none,  dullness-none, rub- none           Chest wall-  Abd-  Br/ Gen/ Rectal- Not done, not indicated Extrem- cyanosis- none, clubbing, none, atrophy- none, strength- nl Neuro- grossly intact to observation

## 2017-06-08 NOTE — Patient Instructions (Addendum)
Order- CXR, ACE level      Dx sarcoid  Get the flu shot as soon as you start feeling better- you don't need to wait until you feel completely well- Mother Nancy Villarreal won't wait.   Please call if we can help

## 2017-06-09 LAB — ANGIOTENSIN CONVERTING ENZYME: Angiotensin-Converting Enzyme: 89 U/L — ABNORMAL HIGH (ref 9–67)

## 2017-06-10 DIAGNOSIS — J069 Acute upper respiratory infection, unspecified: Secondary | ICD-10-CM | POA: Insufficient documentation

## 2017-06-10 NOTE — Assessment & Plan Note (Signed)
She dropped off of methotrexate one month ago

## 2017-06-10 NOTE — Assessment & Plan Note (Signed)
I don't think she's had an exacerbation of sarcoid. We would like to see where things stand now after she has been off of methotrexate by her own decision for a month. Plan-CXR, ACE level

## 2017-06-10 NOTE — Assessment & Plan Note (Signed)
Simple viral syndrome. She is getting better on her own. Antibiotics not indicated. We discussed symptom management and fluids.

## 2017-06-13 ENCOUNTER — Telehealth: Payer: Self-pay | Admitting: Internal Medicine

## 2017-06-13 NOTE — Telephone Encounter (Signed)
Notes recorded by Waymon Budge, MD on 06/09/2017 at 4:59 PM EDT CXR- stable old sarcoid scarring. No new or active process. Good report.  Left message for patient to call back for results.

## 2017-06-13 NOTE — Telephone Encounter (Signed)
Spoke with pt, aware of results/recs.  Nothing further needed.  

## 2017-06-14 NOTE — Progress Notes (Addendum)
Office Visit Note  Patient: Nancy Villarreal             Date of Birth: 1970-09-08           MRN: [id number]             PCP: Etta Grandchild, MD Referring: Etta Grandchild, MD Visit Date: 06/26/2017 Occupation: @    Subjective:  Right foot pain.   History of Present Illness: Nancy Villarreal is a 46 y.o. female with history of sarcoidosis. Patient was accompanied by her husband today. She states she had recent visit with Dr. Maple Hudson. He plans to see her every 6 months. He also did a chest x-ray on 06/08/2017 the report shows that there is stable findings of sarcoidosis. She denies any joint swelling she's been having some discomfort in her right foot. She has been off methotrexate for a month due to the cost of the medication.  Activities of Daily Living:  Patient reports morning stiffness for 0 minutes.   Patient Denies nocturnal pain.  Difficulty dressing/grooming: Denies Difficulty climbing stairs: Denies Difficulty getting out of chair: Denies Difficulty using hands for taps, buttons, cutlery, and/or writing: Denies   Review of Systems  Constitutional: Negative.  Negative for fatigue and weakness.  HENT: Negative.  Negative for mouth dryness.   Eyes: Negative.  Negative for dryness.  Respiratory: Negative.  Negative for cough and shortness of breath.   Cardiovascular: Negative.  Negative for chest pain, palpitations, hypertension and swelling in legs/feet.  Gastrointestinal: Negative.  Negative for blood in stool, constipation and diarrhea.  Musculoskeletal: Positive for arthralgias and joint pain. Negative for joint swelling, myalgias, muscle weakness, morning stiffness, muscle tenderness and myalgias.  Skin: Negative.  Negative for rash, hair loss and sensitivity to sunlight.  Neurological: Negative for dizziness, numbness and headaches.  Psychiatric/Behavioral: Negative.  Negative for depressed mood and sleep disturbance.    PMFS History:  Patient Active Problem  List   Diagnosis Date Noted  . Upper respiratory infection, acute 06/10/2017  . Bilateral hand pain 01/13/2017  . Pain, joint, shoulder, left 01/13/2017  . Pain in left ankle and joints of left foot 01/13/2017  . High risk medication use 01/13/2017  . Nausea 08/09/2016  . Cough 03/09/2016  . Obesity (BMI 30-39.9) 01/22/2016  . Right foot pain 01/21/2016  . Routine general medical examination at a health care facility 12/11/2013  . Visit for screening mammogram 03/07/2012  . Anemia, iron deficiency 02/10/2012  . Sarcoidosis (HCC) 09/17/2008  . Asthma in remission 09/03/2008  . Allergic rhinitis due to pollen 09/03/2008    Past Medical History:  Diagnosis Date  . Abnormal chest x-ray   . Anemia   . Hemoptysis   . Sarcoid    bronch negative 09-2008 ACE 93  . Unspecified asthma(493.90)     Family History  Problem Relation Age of Onset  . Asthma Paternal Grandfather   . Lung cancer Maternal Grandfather   . Hyperlipidemia Mother   . Hypertension Mother   . Diabetes Father   . Hyperlipidemia Maternal Grandmother   . Stroke Maternal Grandmother   . Cancer Paternal Grandmother   . Alcohol abuse Paternal Grandmother   . Early death Paternal Grandmother    Past Surgical History:  Procedure Laterality Date  . BRONCHOSCOPY  09-2008   Negative  . CESAREAN SECTION    . TUBAL LIGATION     1995   Social History   Social History Narrative  . No narrative on  file     Objective: Vital Signs: BP 103/68 (BP Location: Left Arm, Patient Position: Sitting, Cuff Size: Normal)   Pulse 82   Ht  (1.6 m)   Wt 181 lb (82.1 kg)   LMP 06/08/2017   BMI 32.06 kg/m    Physical Exam  Constitutional: She is oriented to person, place, and time. She appears well-developed and well-nourished.  HENT:  Head: Normocephalic and atraumatic.  Eyes: Conjunctivae and EOM are normal.  Neck: Normal range of motion.  Cardiovascular: Normal rate, regular rhythm, normal heart sounds and intact  distal pulses.   Pulmonary/Chest: Effort normal and breath sounds normal.  Abdominal: Soft. Bowel sounds are normal.  Lymphadenopathy:    She has no cervical adenopathy.  Neurological: She is alert and oriented to person, place, and time.  Skin: Skin is warm and dry. Capillary refill takes less than 2 seconds.  Psychiatric: She has a normal mood and affect. Her behavior is normal.  Nursing note and vitals reviewed.    Musculoskeletal Exam: C-spine and thoracic lumbar spine good range of motion. Shoulder joints although joints wrist joint MCPs PIPs DIPs are good range of motion. Hip joints knee joints ankles MTPs PIPs with good range of motion. 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  CDAI Exam: No CDAI exam completed.    Investigation: No additional findings.TB Gold: Negative in 12/2016 CBC Latest Ref Rng & Units 01/09/2017 10/06/2016 09/24/2016  WBC 3.8 - 10.8 K/uL 3.3(L) 9.9 4.2(A)  Hemoglobin 11.7 - 15.5 g/dL 82.9 11.5(A) 10.5(A)  Hematocrit 35.0 - 45.0 % 37.3 34.4(A) 30.5(A)  Platelets 140 - 400 K/uL 188 - -   CMP Latest Ref Rng & Units 01/09/2017 12/19/2016 10/06/2016  Glucose 65 - 99 mg/dL 71 84 96  BUN 7 - 25 mg/dL Creatinine 0.50 - 1.10 mg/dL 5.62 1.30 8.65  Sodium 135 - 146 mmol/L 139 138 136  Potassium 3.5 - 5.3 mmol/L 4.4 4.3 4.0  Chloride 98 - 110 mmol/L 105 104 100  CO2 20 - 31 mmol/L Calcium 8.6 - 10.2 mg/dL 9.1 9.0 8.9  Total Protein 6.1 - 8.1 g/dL 7.5 8.1 -  Total Bilirubin 0.2 - 1.2 mg/dL 0.3 0.3 -  Alkaline Phos 33 - 115 U/L 36 44 -  AST 10 - 35 U/L 23 24 -  ALT 6 - 29 U/L 12 13 -    Imaging: Dg Chest 2 View  Result Date: 06/08/2017 CLINICAL DATA:  Routine chest radiograph.  History of sarcoid. EXAM: CHEST  2 VIEW COMPARISON:  09/27/2016. FINDINGS: Fibrotic changes bilaterally consistent with history of sarcoidosis. Mildly prominent hilar regions reflect borderline enlarged lymph nodes with calcifications. Normal  cardiomediastinal silhouette. No effusion or pneumothorax. Bones unremarkable. IMPRESSION: Stable findings of sarcoidosis. No significant worsening since January. Electronically Signed   By: Elsie Stain M.D.   On: 06/08/2017 15:52    Speciality Comments: No specialty comments available.    Procedures:  No procedures performed Allergies: Hydrocodone   Assessment / Plan:     Visit Diagnoses: Sarcoidosis - followed up by Dr. Maple Hudson. Patient had recent visit with Dr. Maple Hudson. Her most recent chest x-ray showed that her pulmonary disease is stable. She has no evidence of inflammatory arthritis currently. Patient discontinued methotrexate about a month ago due to the cost of the medication. We had detailed discussion regarding benefits of methotrexate use with pulmonary sarcoidosis. She will be restarting the medication and wants 90 day prescription  which will be cheaper for her. I will give her 90 day prescription today. She does have folic acid to continue with that.  High risk medication use - MTX6 tablets by mouth every week, folic acid 1 mg by mouth daily.her last labs showed neutropenia. We will check her labs today and then she will get labs every 3 months.  Bunion of grade 2 of right foot: Which causes some discomfort. Proper fitting shoes were discussed.  History of anemia: Stable  Asthma in remission: Followed up by Dr. Maple Hudson area  Abnormal laboratory test - Elevated IgG. Patient has been followed by Dr. Clelia Croft.    Orders: No orders of the defined types were placed in this encounter.  Meds ordered this encounter  Medications  . methotrexate (RHEUMATREX) 2.5 MG tablet    Sig: Take 6 tabs po weekly    Dispense:  72 tablet    Refill:  0    Face-to-face time spent with patient was 30 minutes. Greater than 50% of time was spent in counseling and coordination of care.  Follow-Up Instructions: Return in about 5 months (around 11/24/2017) for Sarcoidosis.   Pollyann Savoy,  MD  Note - This record has been created using Animal nutritionist.  Chart creation errors have been sought, but may not always  have been located. Such creation errors do not reflect on  the standard of medical care.

## 2017-06-26 ENCOUNTER — Ambulatory Visit (INDEPENDENT_AMBULATORY_CARE_PROVIDER_SITE_OTHER): Payer: Managed Care, Other (non HMO) | Admitting: Rheumatology

## 2017-06-26 ENCOUNTER — Encounter: Payer: Self-pay | Admitting: Rheumatology

## 2017-06-26 VITALS — BP 103/68 | HR 82 | Ht 63.0 in | Wt 181.0 lb

## 2017-06-26 DIAGNOSIS — Z862 Personal history of diseases of the blood and blood-forming organs and certain disorders involving the immune mechanism: Secondary | ICD-10-CM | POA: Diagnosis not present

## 2017-06-26 DIAGNOSIS — R899 Unspecified abnormal finding in specimens from other organs, systems and tissues: Secondary | ICD-10-CM | POA: Diagnosis not present

## 2017-06-26 DIAGNOSIS — M21611 Bunion of right foot: Secondary | ICD-10-CM | POA: Diagnosis not present

## 2017-06-26 DIAGNOSIS — Z79899 Other long term (current) drug therapy: Secondary | ICD-10-CM | POA: Diagnosis not present

## 2017-06-26 DIAGNOSIS — J45998 Other asthma: Secondary | ICD-10-CM | POA: Diagnosis not present

## 2017-06-26 DIAGNOSIS — D869 Sarcoidosis, unspecified: Secondary | ICD-10-CM | POA: Diagnosis not present

## 2017-06-26 LAB — COMPLETE METABOLIC PANEL WITH GFR
AG RATIO: 0.9 (calc) — AB (ref 1.0–2.5)
ALT: 10 U/L (ref 6–29)
AST: 19 U/L (ref 10–35)
Albumin: 3.9 g/dL (ref 3.6–5.1)
Alkaline phosphatase (APISO): 41 U/L (ref 33–115)
BILIRUBIN TOTAL: 0.3 mg/dL (ref 0.2–1.2)
BUN: 10 mg/dL (ref 7–25)
CALCIUM: 9.4 mg/dL (ref 8.6–10.2)
CHLORIDE: 105 mmol/L (ref 98–110)
CO2: 31 mmol/L (ref 20–32)
Creat: 0.86 mg/dL (ref 0.50–1.10)
GFR, EST AFRICAN AMERICAN: 93 mL/min/{1.73_m2} (ref >=60)
GFR, EST NON AFRICAN AMERICAN: 80 mL/min/{1.73_m2} (ref >=60)
GLOBULIN: 4.3 g/dL — AB (ref 1.9–3.7)
Glucose, Bld: 108 mg/dL — ABNORMAL HIGH (ref 65–99)
POTASSIUM: 4.7 mmol/L (ref 3.5–5.3)
SODIUM: 140 mmol/L (ref 135–146)
TOTAL PROTEIN: 8.2 g/dL — AB (ref 6.1–8.1)

## 2017-06-26 LAB — CBC WITH DIFFERENTIAL/PLATELET
BASOS PCT: 0.3 %
Basophils Absolute: 12 cells/uL (ref 0–200)
Eosinophils Absolute: 121 cells/uL (ref 15–500)
Eosinophils Relative: 3.1 %
HCT: 36 % (ref 35.0–45.0)
Hemoglobin: 11.9 g/dL (ref 11.7–15.5)
Lymphs Abs: 1661 cells/uL (ref 850–3900)
MCH: 28.3 pg (ref 27.0–33.0)
MCHC: 33.1 g/dL (ref 32.0–36.0)
MCV: 85.5 fL (ref 80.0–100.0)
MONOS PCT: 11.5 %
MPV: 12.6 fL — AB (ref 7.5–12.5)
NEUTROS PCT: 42.5 %
Neutro Abs: 1658 cells/uL (ref 1500–7800)
Platelets: 167 10*3/uL (ref 140–400)
RBC: 4.21 10*6/uL (ref 3.80–5.10)
RDW: 11.9 % (ref 11.0–15.0)
TOTAL LYMPHOCYTE: 42.6 %
WBC mixed population: 449 cells/uL (ref 200–950)
WBC: 3.9 10*3/uL (ref 3.8–10.8)

## 2017-06-26 MED ORDER — METHOTREXATE 2.5 MG PO TABS: ORAL_TABLET | ORAL | 0 refills | Status: DC

## 2017-06-26 NOTE — Patient Instructions (Addendum)
Standing Labs We placed an order today for your standing lab work.    Please come back and get your standing labs in January and every 3 months  We have open lab Monday through Friday from 8:30-11:30 AM and 1:30-4 PM at the office of Dr. Edvardo Honse.   The office is located at [address], [address], [place], [place] [address] No appointment is necessary.   Labs are drawn by [facility].  You may receive a bill from [facility] for your lab work. If you have any questions regarding directions or hours of operation,  please call [phone].    

## 2017-06-27 NOTE — Progress Notes (Signed)
Labs are stable.

## 2017-06-30 ENCOUNTER — Ambulatory Visit: Payer: 59 | Admitting: Internal Medicine

## 2017-07-05 ENCOUNTER — Telehealth: Payer: Self-pay

## 2017-07-05 MED ORDER — METHOTREXATE 2.5 MG PO TABS: ORAL_TABLET | ORAL | 0 refills | Status: DC

## 2017-07-05 NOTE — Telephone Encounter (Signed)
Patient called stating that she needs a Rx for MTX faxed to [facility] at [phone] option#3.  State that she is not able to fax Rx.  CB# is [phone].  Please advise.  Thank You.

## 2017-07-05 NOTE — Addendum Note (Signed)
Addended by: Henriette CombsHATTON, Charelle Petrakis L on: 07/05/2017 11:25 AM   Modules accepted: Orders

## 2017-07-05 NOTE — Telephone Encounter (Signed)
Last Visit: 06/26/17 Next Visit due in March. Message sent to the front to schedule patient.  Labs: 06/26/17 stable   Okay to refill per Dr. Corliss Skainseveshwar  Patient advised.

## 2017-09-14 ENCOUNTER — Other Ambulatory Visit: Payer: Self-pay | Admitting: Internal Medicine

## 2017-09-19 ENCOUNTER — Other Ambulatory Visit: Payer: Managed Care, Other (non HMO)

## 2017-09-26 ENCOUNTER — Inpatient Hospital Stay: Payer: Managed Care, Other (non HMO) | Attending: Oncology | Admitting: Oncology

## 2017-09-26 VITALS — BP 118/68 | HR 89 | Temp 98.9°F | Resp 20 | Ht 63.0 in | Wt 181.4 lb

## 2017-09-26 DIAGNOSIS — D890 Polyclonal hypergammaglobulinemia: Secondary | ICD-10-CM

## 2017-09-26 DIAGNOSIS — D869 Sarcoidosis, unspecified: Secondary | ICD-10-CM | POA: Diagnosis not present

## 2017-09-26 DIAGNOSIS — Z79899 Other long term (current) drug therapy: Secondary | ICD-10-CM | POA: Diagnosis not present

## 2017-09-26 NOTE — Progress Notes (Signed)
Hematology and Oncology Follow Up Visit  IVIE SAVITT [id number] 08/08/1971 47 y.o. 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  Etiology is related to chronic inflammatory condition possibly sarcoidosis.  Current therapy: No intervention needed.  Interim History: Ms. Ena Dawley presents today for a follow-up visit.  Since the last visit, she reports no recent complaints.  She is not taking any medication for her sarcoidosis and has been on methotrexate in the past.  Her appetite remain excellent and performance status is adequate.  She denies any respiratory issues including cough or wheezing.  She denies any joint swelling or pain.  She denies any decrease in her appetite.  She denies any pathological fractures.  She does not report any headaches, blurry vision, syncope or seizures. Does not report any fevers, chills or sweats.  Does not report any cough, wheezing or hemoptysis.  Does not report any chest pain, palpitation, orthopnea or leg edema.  Does not report any nausea, vomiting or abdominal pain.  She does not report any constipation or diarrhea.  Does not report any lymphadenopathy or petechiae. Remaining review of systems is negative.    Medications: I have reviewed the patient's current medications.  Current Outpatient Medications  Medication Sig Dispense Refill  . Albuterol Sulfate (PROAIR RESPICLICK) 675 (90 Base) MCG/ACT AEPB Inhale 2 puffs into the lungs every 4 (four) hours as needed. (Patient not taking: Reported on 06/26/2017) 1 each 2  . cyclobenzaprine (FLEXERIL) 10 MG tablet Take 1 tablet (10 mg total) by mouth 3 (three) times daily as needed for muscle spasms. 30 tablet 1  . ferrous fumarate-iron polysaccharide complex (TANDEM) 162-115.2 MG CAPS capsule Take 1 capsule by mouth daily with breakfast. (Patient not taking: Reported on 06/26/2017) 30 capsule  11  . folic acid (FOLVITE) 1 MG tablet Take 2 tablets (2 mg total) by mouth daily. 180 tablet 3  . methotrexate (RHEUMATREX) 2.5 MG tablet Take 6 tabs po weekly 72 tablet 0  . Multiple Vitamin (MULTIVITAMIN) tablet Take 1 tablet by mouth daily.    . ranitidine (ZANTAC) 150 MG capsule Take 150 mg by mouth daily as needed for heartburn.     No current facility-administered medications for this visit.      Allergies:  Allergies  Allergen Reactions  . Hydrocodone Nausea And Vomiting    Past Medical History, Surgical history, Social history, and Family History were reviewed and updated.  Physical Exam: Blood pressure 118/68, pulse 89, temperature 98.9 F (37.2 C), temperature source Oral, resp. rate 20, height '5\' 3"'$  (1.6 m), weight 181 lb 6.4 oz (82.3 kg), SpO2 100 %. ECOG: 0 General appearance: alert and cooperative appeared without distress. Head: Normocephalic, without obvious abnormality Oral mucosa is moist and pink.  No scleral icterus. Lymph nodes: Cervical, supraclavicular, and axillary nodes normal. Heart:regular rate and rhythm, S1, S2 normal, no murmur, click, rub or gallop Lung:chest clear, no wheezing, rales, normal symmetric air entry. Abdomin: soft, non-tender, without masses or organomegaly Musculoskeletal: No joint pain or tenderness. Neurological: No deficits.  Lab Results: Lab Results  Component Value Date   WBC 3.9 06/26/2017   HGB 11.9 06/26/2017   HCT 36.0 06/26/2017   MCV 85.5 06/26/2017   PLT 167 06/26/2017     Chemistry      Component Value Date/Time   NA 140 06/26/2017 1011   NA 136 10/06/2016 0840   K 4.7 06/26/2017 1011  CL 105 06/26/2017 1011   CO2 31 06/26/2017 1011   BUN 10 06/26/2017 1011   BUN 13 10/06/2016 0840   CREATININE 0.86 06/26/2017 1011      Component Value Date/Time   CALCIUM 9.4 06/26/2017 1011   ALKPHOS 36 01/09/2017 0900   AST 19 06/26/2017 1011   ALT 10 06/26/2017 1011   BILITOT 0.3 06/26/2017 1011       Impression and Plan:   47 year old woman with the following issues:  1.  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 Her immunofixation showed a polyclonal finding without any evidence of an M spike. Her hemoglobin, chemistries and kidney function all within normal range. These findings in the setting of history of sarcoidosis.  Laboratory data done in October 2018 was personally reviewed and discussed with the patient today.  The differential diagnosis was discussed again and that these findings suggest a reactive polyclonal gammopathy.  I see no evidence to suggest plasma cell disorder such as myeloma, MGUS or amyloidosis.  I see no further hematology workup is needed at this time.  I see no need for bone marrow biopsy or imaging studies.  2.  Sarcoidosis: She continues to follow with pulmonary medicine.  Chest x-ray was obtained in October 2018 was reviewed and did not show any new abnormalities.  Her history of sarcoidosis remains possibly active.  I am happy to see her in the future as needed.  15  minutes was spent with the patient face-to-face today.  More than 50% of time was dedicated to patient counseling, education and coordination of her multifaceted care.   Zola Button, MD 1/22/20198:18 AM

## 2017-12-07 ENCOUNTER — Encounter: Payer: Self-pay | Admitting: Internal Medicine

## 2017-12-07 ENCOUNTER — Ambulatory Visit (INDEPENDENT_AMBULATORY_CARE_PROVIDER_SITE_OTHER): Payer: Managed Care, Other (non HMO) | Admitting: Internal Medicine

## 2017-12-07 ENCOUNTER — Ambulatory Visit (INDEPENDENT_AMBULATORY_CARE_PROVIDER_SITE_OTHER)
Admission: RE | Admit: 2017-12-07 | Discharge: 2017-12-07 | Disposition: A | Discharge status: Home / Self Care | Payer: Managed Care, Other (non HMO) | Source: Ambulatory Visit | Attending: Internal Medicine | Admitting: Internal Medicine

## 2017-12-07 ENCOUNTER — Other Ambulatory Visit (INDEPENDENT_AMBULATORY_CARE_PROVIDER_SITE_OTHER): Payer: Managed Care, Other (non HMO)

## 2017-12-07 VITALS — BP 104/70 | HR 75 | Ht 63.0 in | Wt 180.0 lb

## 2017-12-07 DIAGNOSIS — D869 Sarcoidosis, unspecified: Secondary | ICD-10-CM

## 2017-12-07 LAB — CBC WITH DIFFERENTIAL/PLATELET
BASOS ABS: 0 10*3/uL (ref 0.0–0.1)
Basophils Relative: 0.6 % (ref 0.0–3.0)
EOS ABS: 0.1 10*3/uL (ref 0.0–0.7)
Eosinophils Relative: 2.4 % (ref 0.0–5.0)
HEMATOCRIT: 37.9 % (ref 36.0–46.0)
HEMOGLOBIN: 12.3 g/dL (ref 12.0–15.0)
LYMPHS PCT: 38.5 % (ref 12.0–46.0)
Lymphs Abs: 1.6 10*3/uL (ref 0.7–4.0)
MCHC: 32.6 g/dL (ref 30.0–36.0)
MCV: 85.5 fl (ref 78.0–100.0)
Monocytes Absolute: 0.4 10*3/uL (ref 0.1–1.0)
Monocytes Relative: 10.7 % (ref 3.0–12.0)
NEUTROS ABS: 2 10*3/uL (ref 1.4–7.7)
Neutrophils Relative %: 47.8 % (ref 43.0–77.0)
PLATELETS: 167 10*3/uL (ref 150.0–400.0)
RBC: 4.43 Mil/uL (ref 3.87–5.11)
RDW: 12.7 % (ref 11.5–15.5)
WBC: 4.2 10*3/uL (ref 4.0–10.5)

## 2017-12-07 NOTE — Progress Notes (Signed)
Patient ID: Nancy Villarreal, female    DOB: 1970-11-02, 47 y.o.   MRN: [id number]  HPI F former smoker followed here with sarcoid Stage III,  Bronch NEG 09/24/08- dx based on mediastinal adenopathy/ interstitial disease,chronic elev ACE , response to steroid. Complicated by Iron def anemia, plasma cell disorder. Bronch NEG 09/24/08- dx based on mediastinal adenopathy/ interstitial disease, ACE 90, response to steroids. PFT 10/11/16- ACE 09/30/16- 86H Immunoglobulins 12/19/16- IgG 2,669 --------------------------------------------------------------------------------------------------------- 06/08/17- 47 yoF former smoker followed here with sarcoid Stage III,  Bronch NEG 09/24/08- dx based on mediastinal adenopathy/ interstitial disease, chronic elevation of ACE , response to steroid. Complicated by Iron def anemia, plasma cell disorder FOLLOWS FOR: Sarcoidosis.  Pt has been doing well until this morning- c/o sore throat, sinus congestion, sometimes prod cough with yellow/red mucus.   ProAir, MTX 2.5 mg- 6 tabs weekly/ folic acid In the last few days she's had sore throat, chest congestion, no fever or purulent sputum. Treating herself with DayQuil. Had dropped off methotrexate 1 month ago. Never needs rescue inhaler. CT chest 10/27/16 IMPRESSION: 1. The overall appearance of the thorax is very similar to the prior study, and again compatible with the patient's reported clinical history of sarcoidosis. No acute findings are noted on today's Examination.  12/07/17- 47 yoF former smoker followed here with Sarcoid Stage III,  Bronch NEG 09/24/08- dx based on mediastinal adenopathy/ interstitial disease, chronic elevation of ACE , response to steroid. Complicated by Iron def anemia, plasma cell disorder No longer taking -MTX -dropped off 4 months ago. Has not heard from Dr Janell Quiet well, denying problems with general health.  Denies fever, cough, adenopathy, shortness of breath, rash. CXR image reviewed  with her. CXR 12/19/16- Stable findings of sarcoidosis. No significant worsening since January.  Review of Systems-see HPI   + = positive HEENT:   No headaches,  Difficulty swallowing,  Tooth/dental problems, + Sore throat,                No sneezing, itching, ear ache, nasal congestion, post nasal drip,  CV:  No- chest pain,  No-PND, swelling in lower extremities, anasarca, dizziness, palpitations GI  No heartburn, indigestion, abdominal pain, nausea, vomiting,  Resp:  No acute shortness of breath with exertion .  No excess mucus, no productive cough,                 + non-productive cough,  No- recent coughing up of blood.  No change in color of mucus.  No wheezing.  Skin: no rash or lesions. GU: MS: joint pain .  No decreased range of motion.  No back pain. Psych:  No change in mood or affect. No depression or anxiety.  No memory loss.  Objective:   Physical Exam General- Alert, Oriented, Affect-appropriate, Distress- none acute. Skin-  no rash, lesions- none, excoriation- none Lymphadenopathy- none Head- atraumatic            Eyes- Gross vision intact, PERRLA, conjunctivae clear secretions            Ears- Hearing, canals normal            Nose- Clear, No-Septal dev, mucus, polyps, erosion, perforation             Throat- Mallampati II , mucosa-clear , drainage- none, tonsils present. + Hoarse                    Neck- flexible , trachea midline, no stridor , thyroid  nl, carotid no bruit Chest - symmetrical excursion , unlabored           Heart/CV- RRR , no murmur , no gallop  , no rub, nl s1 s2                           - JVD- none , edema- none, stasis changes- none, varices- none           Lung-   Cough-none, chest clear, unlabored, wheeze- none,  dullness-none, rub- none           Chest wall-  Abd-  Br/ Gen/ Rectal- Not done, not indicated Extrem- cyanosis- none, clubbing, none, atrophy- none, strength- nl Neuro- grossly intact to observation

## 2017-12-07 NOTE — Patient Instructions (Signed)
Order- CXR    Dx Sarcoid             Lab- CBC w diff, ACE level  Please call if we can help

## 2017-12-07 NOTE — Assessment & Plan Note (Signed)
Symptomatically stable consistent with burned out sarcoid but ACE level has remained elevated.  We will check again since she has been off of methotrexate for several months.  That has been managed by her rheumatologist for plasma cell disorder.

## 2017-12-08 LAB — ANGIOTENSIN CONVERTING ENZYME: ANGIOTENSIN-CONVERTING ENZYME: 97 U/L — AB (ref 9–67)

## 2018-02-20 ENCOUNTER — Encounter: Payer: Self-pay | Admitting: Family

## 2018-02-20 ENCOUNTER — Ambulatory Visit (INDEPENDENT_AMBULATORY_CARE_PROVIDER_SITE_OTHER): Payer: Managed Care, Other (non HMO) | Admitting: Family

## 2018-02-20 ENCOUNTER — Other Ambulatory Visit: Payer: Self-pay | Admitting: Family

## 2018-02-20 ENCOUNTER — Other Ambulatory Visit (HOSPITAL_COMMUNITY)
Admission: RE | Admit: 2018-02-20 | Discharge: 2018-02-20 | Disposition: A | Discharge status: Home / Self Care | Payer: Managed Care, Other (non HMO) | Source: Ambulatory Visit | Attending: Family | Admitting: Family

## 2018-02-20 ENCOUNTER — Other Ambulatory Visit (INDEPENDENT_AMBULATORY_CARE_PROVIDER_SITE_OTHER): Payer: Managed Care, Other (non HMO)

## 2018-02-20 ENCOUNTER — Telehealth: Payer: Self-pay | Admitting: Family

## 2018-02-20 ENCOUNTER — Other Ambulatory Visit: Payer: Managed Care, Other (non HMO)

## 2018-02-20 VITALS — BP 124/82 | HR 84 | Temp 98.5°F | Ht 63.0 in | Wt 179.1 lb

## 2018-02-20 DIAGNOSIS — Z124 Encounter for screening for malignant neoplasm of cervix: Secondary | ICD-10-CM

## 2018-02-20 DIAGNOSIS — D509 Iron deficiency anemia, unspecified: Secondary | ICD-10-CM | POA: Diagnosis not present

## 2018-02-20 DIAGNOSIS — Z13220 Encounter for screening for lipoid disorders: Secondary | ICD-10-CM

## 2018-02-20 DIAGNOSIS — Z0000 Encounter for general adult medical examination without abnormal findings: Secondary | ICD-10-CM

## 2018-02-20 DIAGNOSIS — Z1231 Encounter for screening mammogram for malignant neoplasm of breast: Secondary | ICD-10-CM

## 2018-02-20 LAB — CBC WITH DIFFERENTIAL/PLATELET
BASOS PCT: 0.4 % (ref 0.0–3.0)
Basophils Absolute: 0 10*3/uL (ref 0.0–0.1)
EOS PCT: 2.3 % (ref 0.0–5.0)
Eosinophils Absolute: 0.1 10*3/uL (ref 0.0–0.7)
HEMATOCRIT: 37.2 % (ref 36.0–46.0)
HEMOGLOBIN: 12.3 g/dL (ref 12.0–15.0)
LYMPHS PCT: 39.9 % (ref 12.0–46.0)
Lymphs Abs: 2.1 10*3/uL (ref 0.7–4.0)
MCHC: 33.2 g/dL (ref 30.0–36.0)
MCV: 84.6 fl (ref 78.0–100.0)
Monocytes Absolute: 0.4 10*3/uL (ref 0.1–1.0)
Monocytes Relative: 7.6 % (ref 3.0–12.0)
Neutro Abs: 2.6 10*3/uL (ref 1.4–7.7)
Neutrophils Relative %: 49.8 % (ref 43.0–77.0)
Platelets: 184 10*3/uL (ref 150.0–400.0)
RBC: 4.39 Mil/uL (ref 3.87–5.11)
RDW: 12.4 % (ref 11.5–15.5)
WBC: 5.3 10*3/uL (ref 4.0–10.5)

## 2018-02-20 LAB — LIPID PANEL
CHOL/HDL RATIO: 3
Cholesterol: 235 mg/dL — ABNORMAL HIGH (ref 0–200)
HDL: 85.6 mg/dL (ref >39.00)
LDL Cholesterol: 130 mg/dL — ABNORMAL HIGH (ref 0–99)
NonHDL: 149.49
Triglycerides: 95 mg/dL (ref 0.0–149.0)
VLDL: 19 mg/dL (ref 0.0–40.0)

## 2018-02-20 LAB — COMPREHENSIVE METABOLIC PANEL
ALK PHOS: 47 U/L (ref 39–117)
ALT: 10 U/L (ref 0–35)
AST: 19 U/L (ref 0–37)
Albumin: 4.1 g/dL (ref 3.5–5.2)
BILIRUBIN TOTAL: 0.3 mg/dL (ref 0.2–1.2)
BUN: 12 mg/dL (ref 6–23)
CO2: 24 meq/L (ref 19–32)
CREATININE: 0.81 mg/dL (ref 0.40–1.20)
Calcium: 9.4 mg/dL (ref 8.4–10.5)
Chloride: 101 mEq/L (ref 96–112)
GFR: 96.95 mL/min (ref >60.00)
GLUCOSE: 78 mg/dL (ref 70–99)
Potassium: 3.7 mEq/L (ref 3.5–5.1)
Sodium: 135 mEq/L (ref 135–145)
TOTAL PROTEIN: 8.9 g/dL — AB (ref 6.0–8.3)

## 2018-02-20 MED ORDER — MULTI-VITAMIN/MINERALS PO TABS: 1.0000 | ORAL_TABLET | Freq: Every day | ORAL | 11 refills | Status: DC

## 2018-02-20 MED ORDER — FERROUS FUM-IRON POLYSACCH 162-115.2 MG PO CAPS: 1.0000 | ORAL_CAPSULE | Freq: Every day | ORAL | 11 refills | Status: DC

## 2018-02-20 NOTE — Telephone Encounter (Signed)
Copied from CRM [id number]. Topic: Quick Communication - See Telephone Encounter >> Feb 20, 2018  4:51 PM Rudi CocoLathan, Jeromiah Ohalloran M, NT wrote: CRM for notification. See Telephone encounter for: 02/20/18.  Pt. Needing a new rx. [facility]amin [facility] and insurance will not cover that seeing if something else can be called in    [facility] [address] [place] - [address] [place] [id number] Phone: [phone] Fax: [phone]

## 2018-02-20 NOTE — Progress Notes (Signed)
Nancy Villarreal is a 47 y.o. female with the following history as recorded in EpicCare:  Patient Active Problem List   Diagnosis Date Noted  . Upper respiratory infection, acute 06/10/2017  . Bilateral hand pain 01/13/2017  . Pain, joint, shoulder, left 01/13/2017  . Pain in left ankle and joints of left foot 01/13/2017  . High risk medication use 01/13/2017  . Nausea 08/09/2016  . Cough 03/09/2016  . Obesity (BMI 30-39.9) 01/22/2016  . Right foot pain 01/21/2016  . Routine general medical examination at a health care facility 12/11/2013  . Visit for screening mammogram 03/07/2012  . Anemia, iron deficiency 02/10/2012  . Sarcoidosis ([place]) 09/17/2008  . Asthma in remission 09/03/2008  . Allergic rhinitis due to pollen 09/03/2008    Current Outpatient Medications  Medication Sig Dispense Refill  . ferrous fumarate-iron polysaccharide complex (TANDEM) 162-115.2 MG CAPS capsule Take 1 capsule by mouth daily with breakfast. 30 capsule 11  . Multiple Vitamin (MULTIVITAMIN) tablet Take 1 tablet by mouth daily.    . Multiple Vitamins-Minerals (MULTIVITAMIN WITH MINERALS) tablet Take 1 tablet by mouth daily. 30 tablet 11  . ranitidine (ZANTAC) 150 MG capsule Take 150 mg by mouth daily as needed for heartburn.     No current facility-administered medications for this visit.     Allergies: Hydrocodone  Past Medical History:  Diagnosis Date  . Abnormal chest x-ray   . Anemia   . Hemoptysis   . Sarcoid    bronch negative 09-2008 ACE 93  . Unspecified asthma(493.90)     Past Surgical History:  Procedure Laterality Date  . BRONCHOSCOPY  09-2008   Negative  . CESAREAN SECTION    . TUBAL LIGATION     1995    Family History  Problem Relation Age of Onset  . Asthma Paternal Grandfather   . Lung cancer Maternal Grandfather   . Hyperlipidemia Mother   . Hypertension Mother   . Diabetes Father   . Hyperlipidemia Maternal Grandmother   . Stroke Maternal Grandmother   . Cancer Paternal  Grandmother   . Alcohol abuse Paternal Grandmother   . Early death Paternal Grandmother     Social History   Tobacco Use  . Smoking status: Former Smoker    Types: Cigarettes    Last attempt to quit: 08/05/2008    Years since quitting: 9.5  . Smokeless tobacco: Never Used  Substance Use Topics  . Alcohol use: No    Subjective:  Patient presents for yearly CPE: has not been seen here for CPE since 2017; in baseline state of health- needs to get back on track with preventive healthcare needs;  History of sarcoidosis- sees Dr. 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   HENT: Negative.   Eyes: Negative.   Respiratory: Negative for wheezing.   Cardiovascular: Negative for chest pain and palpitations.  Gastrointestinal: Positive for constipation. Negative for blood in stool.  Genitourinary: Negative.   Musculoskeletal: Positive for joint pain.  Skin: Negative.   Neurological: Negative.   Endo/Heme/Allergies: Negative.   Psychiatric/Behavioral: Negative.       Objective:  Vitals:   02/20/18 1336  BP: 124/82  Pulse: 84  Temp: 98.5 F (36.9 C)  TempSrc: Oral  SpO2: 98%  Weight: 179 lb 1.3 oz (81.2 kg)  Height: 5' 3"  (1.6 m)    General: Well developed, well nourished, in no acute distress  Skin : Warm and dry.  Head: Normocephalic and atraumatic  Eyes: Sclera and conjunctiva clear; pupils round and reactive to light; extraocular movements intact  Ears: External normal; canals clear; tympanic membranes normal  Oropharynx: Pink, supple. No suspicious lesions  Neck: Supple without thyromegaly, adenopathy  Lungs: Respirations unlabored; clear to auscultation bilaterally  without wheeze, rales, rhonchi  CVS exam: normal rate and regular rhythm.  Abdomen: Soft; nontender; nondistended; normoactive bowel sounds; no masses or hepatosplenomegaly  Musculoskeletal: No deformities; no active joint inflammation  Extremities: No edema, cyanosis, clubbing  Vessels: Symmetric bilaterally  Neurologic: Alert and oriented; speech intact; face symmetrical; moves all extremities well; CNII-XII intact without focal deficit  Assessment:  1. PE (physical exam), annual   2. Lipid screening   3. Iron deficiency anemia, unspecified iron deficiency anemia type   4. Screening mammogram, encounter for   5. Cervical cancer screening     Plan:  Age appropriate preventive healthcare needs; will update labs today; Thin prep pap collected; screening mammogram updated; patient to continue follow-up with her pulmonology and rheumatology; may need to refer to hematology to determine source of iron deficiency anemia- periods are normal; follow-up to be determined.   No follow-ups on file.  Orders Placed This Encounter  Procedures  . MM DIGITAL SCREENING BILATERAL    Standing Status:   Future    Standing Expiration Date:   04/23/2019    Order Specific Question:   Reason for Exam (SYMPTOM  OR DIAGNOSIS REQUIRED)    Answer:   screening mammogram    Order Specific Question:   Is the patient pregnant?    Answer:   No    Order Specific Question:   Preferred imaging location?    Answer:   [facility]  . CBC w/Diff    Standing Status:   Future    Number of Occurrences:   1    Standing Expiration Date:   02/20/2019  . Comp Met (CMET)    Standing Status:   Future    Number of Occurrences:   1    Standing Expiration Date:   02/20/2019  . Lipid panel    Standing Status:   Future    Number of Occurrences:   1    Standing Expiration Date:   02/21/2019  . Iron, TIBC and Ferritin Panel    Standing Status:   Future    Number of Occurrences:   1    Standing Expiration Date:   02/21/2019     Requested Prescriptions   Signed Prescriptions Disp Refills  . ferrous fumarate-iron polysaccharide complex (TANDEM) 162-115.2 MG CAPS capsule 30 capsule 11    Sig: Take 1 capsule by mouth daily with breakfast.  . Multiple Vitamins-Minerals (MULTIVITAMIN WITH MINERALS) tablet 30 tablet 11    Sig: Take 1 tablet by mouth daily.

## 2018-02-21 LAB — IRON,TIBC AND FERRITIN PANEL
%SAT: 35 % (ref 16–45)
FERRITIN: 20 ng/mL (ref 16–232)
Iron: 127 ug/dL (ref 40–190)
TIBC: 367 mcg/dL (calc) (ref 250–450)

## 2018-02-21 NOTE — Telephone Encounter (Signed)
Copied from CRM [id number]. Topic: General - Other >> Feb 20, 2018  5:39 PM Mcneil, Ja-Kwan wrote: Reason for CRM: Pt states she was able to get the Rx filled so no additional Rx is needed. Cb# [phone]

## 2018-02-23 ENCOUNTER — Telehealth: Payer: Self-pay | Admitting: Family

## 2018-02-23 ENCOUNTER — Encounter: Payer: Self-pay | Admitting: Family

## 2018-02-23 ENCOUNTER — Telehealth: Payer: Self-pay

## 2018-02-23 LAB — CYTOLOGY - PAP: Diagnosis: NEGATIVE

## 2018-02-23 MED ORDER — FERROUS FUM-IRON POLYSACCH 162-115.2 MG PO CAPS: 1.0000 | ORAL_CAPSULE | Freq: Every day | ORAL | 11 refills | Status: DC

## 2018-02-23 NOTE — Telephone Encounter (Signed)
Copied from CRM [id number]. Topic: Quick Communication - Rx Refill/Question >> Feb 23, 2018  7:29 AM Louie BunPalacios Medina, Nancy Villarreal wrote: Medication: ferrous fumarate-iron polysaccharide complex (TANDEM) 162-115.2 MG CAPS capsule  Has the patient contacted their pharmacy? No because pt needs med to go to a different pharmacy please. (Agent: If no, request that the patient contact the pharmacy for the refill.) (Agent: If yes, when and what did the pharmacy advise?)  Preferred Pharmacy (with phone number or street name): [id number] [place]- [place], [place] - [address]  Agent: Please be advised that RX refills may take up to 3 business days. We ask that you follow-up with your pharmacy.

## 2018-02-23 NOTE — Telephone Encounter (Signed)
Patient send over email but I had already responded and sent it before I realized I hadn't sent it to you to address.  (This is what she wrote)   Please let Nancy RiegerLaura know I appreciate her help. If in her opinion hematology is not needed, I am definitely okay with her recommendation. Please let her know I am faxing a health screening form for her to complete if possible, I completely forgot while I was in the office, she can just forward to fax number on the form.  If you are fine I can shoot her a quick message and let her know if you are fine with her holding off on hematology for now.  Thanks

## 2018-02-23 NOTE — Telephone Encounter (Signed)
Okay to send message- thanks

## 2018-02-23 NOTE — Telephone Encounter (Signed)
Responded to patient via my-chart today.

## 2018-02-23 NOTE — Telephone Encounter (Signed)
Resent rx to preferred pharmacy.Marland Kitchen.Raechel Chute/lmb

## 2018-03-01 NOTE — Progress Notes (Signed)
Office Visit Note  Patient: Nancy Villarreal             Date of Birth: 1971-01-21           MRN: [id number]             PCP: Olive BassMurray, Laura Woodruff, FNP Referring: Olive BassMurray, Laura Woodruff,* Visit Date: 03/14/2018 Occupation: @GUAROCC @    Subjective:  Bunion pain   History of Present Illness: Nancy Villarreal is a 47 y.o. female with history of sarcoidosis.  Patient reports that she discontinued methotrexate in December 2018.  She states she was not tolerating side effects and was having significant nausea and fatigue.  She states that she followed up with Dr. Maple HudsonYoung in April 2019 who feels as though her sarcoidosis is stable.  Chest x-ray was performed on 12/07/2017 that remained stable.  ACE levels remain elevated but are stable.  She denies any new pulmonary symptoms.  She denies any coughing or shortness of breath.  She denies any joint pain or joint swelling at this time.  She denies any joint stiffness.  Any swollen lymph nodes.  She occasionally has discomfort in her right first MTP joint.  She reports that she has been working on a daily basis at her home gym.   Activities of Daily Living:  Patient reports morning stiffness for 0 minutes.   Patient Denies nocturnal pain.  Difficulty dressing/grooming: Denies Difficulty climbing stairs: Denies Difficulty getting out of chair: Denies Difficulty using hands for taps, buttons, cutlery, and/or writing: Denies   Review of Systems  Constitutional: Negative for fatigue.  HENT: Negative for mouth sores, mouth dryness and nose dryness.   Eyes: Negative for pain, visual disturbance and dryness.  Respiratory: Negative for cough, hemoptysis, shortness of breath and difficulty breathing.   Cardiovascular: Negative for chest pain, palpitations, hypertension and swelling in legs/feet.  Gastrointestinal: Positive for constipation. Negative for abdominal pain, blood in stool and diarrhea.  Endocrine: Negative for increased urination.    Genitourinary: Negative for painful urination and pelvic pain.  Musculoskeletal: Negative for arthralgias, joint pain, joint swelling, myalgias, muscle weakness, morning stiffness, muscle tenderness and myalgias.  Skin: Negative for color change, pallor, rash, hair loss, nodules/bumps, skin tightness, ulcers and sensitivity to sunlight.  Allergic/Immunologic: Negative for susceptible to infections.  Neurological: Negative for dizziness, light-headedness, numbness, headaches, memory loss and weakness.  Hematological: Negative for bruising/bleeding tendency and swollen glands.  Psychiatric/Behavioral: Negative for depressed mood, confusion and sleep disturbance. The patient is not nervous/anxious.     PMFS History:  Patient Active Problem List   Diagnosis Date Noted  . Upper respiratory infection, acute 06/10/2017  . Bilateral hand pain 01/13/2017  . Pain, joint, shoulder, left 01/13/2017  . Pain in left ankle and joints of left foot 01/13/2017  . High risk medication use 01/13/2017  . Nausea 08/09/2016  . Cough 03/09/2016  . Obesity (BMI 30-39.9) 01/22/2016  . Right foot pain 01/21/2016  . Routine general medical examination at a health care facility 12/11/2013  . Visit for screening mammogram 03/07/2012  . Anemia, iron deficiency 02/10/2012  . Sarcoidosis (HCC) 09/17/2008  . Asthma in remission 09/03/2008  . Allergic rhinitis due to pollen 09/03/2008    Past Medical History:  Diagnosis Date  . Abnormal chest x-ray   . Anemia   . Hemoptysis   . Sarcoid    bronch negative 09-2008 ACE 93  . Unspecified asthma(493.90)     Family History  Problem Relation Age of Onset  .  Asthma Paternal Grandfather   . Lung cancer Maternal Grandfather   . Hyperlipidemia Mother   . Hypertension Mother   . Cancer Mother        lung   . Diabetes Father   . Hyperlipidemia Maternal Grandmother   . Stroke Maternal Grandmother   . Cancer Paternal Grandmother   . Alcohol abuse Paternal  Grandmother   . Early death Paternal Grandmother   . Healthy Son   . Healthy Daughter    Past Surgical History:  Procedure Laterality Date  . BRONCHOSCOPY  09-2008   Negative  . CESAREAN SECTION    . TUBAL LIGATION     1995   Social History   Social History Narrative  . Not on file     Objective: Vital Signs: BP 112/72 (BP Location: Left Arm, Patient Position: Sitting, Cuff Size: Normal)   Pulse 69   Resp 15   Ht 5\' 3"  (1.6 m)   Wt 177 lb 8 oz (80.5 kg)   BMI 31.44 kg/m    Physical Exam  Constitutional: She is oriented to person, place, and time. She appears well-developed and well-nourished.  HENT:  Head: Normocephalic and atraumatic.  Eyes: Conjunctivae and EOM are normal.  Neck: Normal range of motion.  Cardiovascular: Normal rate, regular rhythm, normal heart sounds and intact distal pulses.  Pulmonary/Chest: Effort normal and breath sounds normal.  Abdominal: Soft. Bowel sounds are normal.  Lymphadenopathy:    She has no cervical adenopathy.  Neurological: She is alert and oriented to person, place, and time.  Skin: Skin is warm and dry. Capillary refill takes less than 2 seconds.  Psychiatric: She has a normal mood and affect. Her behavior is normal.  Nursing note and vitals reviewed.   Musculoskeletal Exam: C-spine, thoracic spine, lumbar spine good range of motion.  No midline spinal tenderness.  No SI joint tenderness.  Shoulder joints, elbow joints, wrist joints, MCPs, PIPs, DIPs good range of motion with no synovitis.  Complete fist formation bilaterally.  Hip joints, knee joints, ankle joints, MTPs, PIPs, DIPs good range of motion no synovitis.  She has synovial thickening of bilateral first MTP joints.  No warmth or effusion of bilateral knee joints.  No tenderness of trochanter bursa bilaterally.  CDAI Exam: No CDAI exam completed.    Investigation: No additional findings. CBC Latest Ref Rng & Units 02/20/2018 12/07/2017 06/26/2017  WBC 4.0 - 10.5 K/uL  5.3 4.2 3.9  Hemoglobin 12.0 - 15.0 g/dL 16.1 09.6 04.5  Hematocrit 36.0 - 46.0 % 37.2 37.9 36.0  Platelets 150.0 - 400.0 K/uL 184.0 167.0 167   CMP Latest Ref Rng & Units 02/20/2018 06/26/2017 01/09/2017  Glucose 70 - 99 mg/dL 78 409(W) 71  BUN 6 - 23 mg/dL 12 10 11   Creatinine 0.40 - 1.20 mg/dL 1.19 1.47 8.29  Sodium 135 - 145 mEq/L 135 140 139  Potassium 3.5 - 5.1 mEq/L 3.7 4.7 4.4  Chloride 96 - 112 mEq/L 101 105 105  CO2 19 - 32 mEq/L 24 31 25   Calcium 8.4 - 10.5 mg/dL 9.4 9.4 9.1  Total Protein 6.0 - 8.3 g/dL 8.9(H) 8.2(H) 7.5  Total Bilirubin 0.2 - 1.2 mg/dL 0.3 0.3 0.3  Alkaline Phos 39 - 117 U/L 47 - 36  AST 0 - 37 U/L 19 19 23   ALT 0 - 35 U/L 10 10 12     Imaging: No results found.  Speciality Comments: No specialty comments available.    Procedures:  No procedures performed Allergies:  Hydrocodone   Assessment / Plan:     Visit Diagnoses: Sarcoidosis -She had a follow up appointment with Dr. Maple Hudson on 12/07/17.  According to his note he feels she is symptomatically stable.  ACE levels continue to be elevated but are stable. She denies any new pulmonary symptoms.  She has no shortness of breath or coughing.  Her lungs are clear to ascultation today.  She has no cervical lymphadenopathy. Chest x-ray on 12/07/2017 revealed findings consistent with chronic stable sarcoidosis change.  There is no evident adenopathy. She denies any joint pain or joint swelling at this time.  She has no synovitis or signs of inflammatory arthritis at this time. She discontinued MTX in December 2018 due to not being able to tolerate the side effects of fatigue and nausea.  She does not want to resume MTX at this time.  She was advised to notify us if she develops new or worsening symptoms.   High risk medication use: She d/c MTX in Dec 2018.  Bunion of great toe of right foot: She experiences occasional discomfort.  She has no pain at this time.  No synovitis noted.  The importance of wearing proper  fitting shoes was discussed.   Abnormal laboratory test - Elevated IgG. Patient has been followed by Dr. Clelia Croft.   History of anemia    Orders: No orders of the defined types were placed in this encounter.  No orders of the defined types were placed in this encounter.     Follow-Up Instructions: Return in about 6 months (around 09/14/2018) for Sarcoidosis.   Gearldine Bienenstock, PA-C   I examined and evaluated the patient with Sherron Ales PA.  Patient had no evidence of inflammatory arthritis.  No rash was noted on my examination.  We also reviewed records from Dr. Sinclair Ship office visit according to which her pulmonary disease is stable. We will hold off methotrexate.  The plan of care was discussed as noted above.  Pollyann Savoy, MD  Note - This record has been created using Animal nutritionist.  Chart creation errors have been sought, but may not always  have been located. Such creation errors do not reflect on  the standard of medical care.

## 2018-03-14 ENCOUNTER — Ambulatory Visit (INDEPENDENT_AMBULATORY_CARE_PROVIDER_SITE_OTHER): Payer: Managed Care, Other (non HMO) | Admitting: Rheumatology

## 2018-03-14 ENCOUNTER — Encounter: Payer: Self-pay | Admitting: Rheumatology

## 2018-03-14 VITALS — BP 112/72 | HR 69 | Resp 15 | Ht 63.0 in | Wt 177.5 lb

## 2018-03-14 DIAGNOSIS — M21611 Bunion of right foot: Secondary | ICD-10-CM

## 2018-03-14 DIAGNOSIS — D869 Sarcoidosis, unspecified: Secondary | ICD-10-CM | POA: Diagnosis not present

## 2018-03-14 DIAGNOSIS — R899 Unspecified abnormal finding in specimens from other organs, systems and tissues: Secondary | ICD-10-CM | POA: Diagnosis not present

## 2018-03-14 DIAGNOSIS — Z862 Personal history of diseases of the blood and blood-forming organs and certain disorders involving the immune mechanism: Secondary | ICD-10-CM | POA: Diagnosis not present

## 2018-03-14 DIAGNOSIS — Z79899 Other long term (current) drug therapy: Secondary | ICD-10-CM | POA: Diagnosis not present

## 2018-03-22 ENCOUNTER — Ambulatory Visit
Admission: RE | Admit: 2018-03-22 | Discharge: 2018-03-22 | Disposition: A | Discharge status: Home / Self Care | Payer: Managed Care, Other (non HMO) | Source: Ambulatory Visit | Attending: Family | Admitting: Family

## 2018-03-22 DIAGNOSIS — Z1231 Encounter for screening mammogram for malignant neoplasm of breast: Secondary | ICD-10-CM

## 2018-05-08 ENCOUNTER — Ambulatory Visit (INDEPENDENT_AMBULATORY_CARE_PROVIDER_SITE_OTHER)
Admission: RE | Admit: 2018-05-08 | Discharge: 2018-05-08 | Disposition: A | Discharge status: Home / Self Care | Payer: Managed Care, Other (non HMO) | Source: Ambulatory Visit | Attending: Family | Admitting: Family

## 2018-05-08 ENCOUNTER — Other Ambulatory Visit: Payer: Self-pay | Admitting: Family

## 2018-05-08 ENCOUNTER — Encounter: Payer: Self-pay | Admitting: Family

## 2018-05-08 ENCOUNTER — Ambulatory Visit (INDEPENDENT_AMBULATORY_CARE_PROVIDER_SITE_OTHER): Payer: Managed Care, Other (non HMO) | Admitting: Family

## 2018-05-08 ENCOUNTER — Ambulatory Visit: Payer: Self-pay

## 2018-05-08 VITALS — BP 108/70 | HR 80 | Temp 99.1°F | Ht 63.0 in | Wt 170.0 lb

## 2018-05-08 DIAGNOSIS — R059 Cough, unspecified: Secondary | ICD-10-CM

## 2018-05-08 DIAGNOSIS — R05 Cough: Secondary | ICD-10-CM | POA: Diagnosis not present

## 2018-05-08 MED ORDER — DOXYCYCLINE HYCLATE 100 MG PO TABS: 100.0000 mg | ORAL_TABLET | Freq: Two times a day (BID) | ORAL | 0 refills | Status: DC

## 2018-05-08 MED ORDER — PREDNISONE 20 MG PO TABS: 20.0000 mg | ORAL_TABLET | Freq: Every day | ORAL | 0 refills | Status: DC

## 2018-05-08 NOTE — Progress Notes (Signed)
Nancy Villarreal is a 47 y.o. female with the following history as recorded in EpicCare:  Patient Active Problem List   Diagnosis Date Noted  . Upper respiratory infection, acute 06/10/2017  . Bilateral hand pain 01/13/2017  . Pain, joint, shoulder, left 01/13/2017  . Pain in left ankle and joints of left foot 01/13/2017  . High risk medication use 01/13/2017  . Nausea 08/09/2016  . Cough 03/09/2016  . Obesity (BMI 30-39.9) 01/22/2016  . Right foot pain 01/21/2016  . Routine general medical examination at a health care facility 12/11/2013  . Visit for screening mammogram 03/07/2012  . Anemia, iron deficiency 02/10/2012  . Sarcoidosis (HCC) 09/17/2008  . Asthma in remission 09/03/2008  . Allergic rhinitis due to pollen 09/03/2008    Current Outpatient Medications  Medication Sig Dispense Refill  . ferrous fumarate-iron polysaccharide complex (TANDEM) 162-115.2 MG CAPS capsule Take 1 capsule by mouth daily with breakfast. 30 capsule 11  . Multiple Vitamins-Minerals (MULTIVITAMIN WITH MINERALS) tablet Take 1 tablet by mouth daily. 30 tablet 11   No current facility-administered medications for this visit.     Allergies: Hydrocodone  Past Medical History:  Diagnosis Date  . Abnormal chest x-ray   . Anemia   . Hemoptysis   . Sarcoid    bronch negative 09-2008 ACE 93  . Unspecified asthma(493.90)     Past Surgical History:  Procedure Laterality Date  . BRONCHOSCOPY  09-2008   Negative  . CESAREAN SECTION    . TUBAL LIGATION     1995    Family History  Problem Relation Age of Onset  . Asthma Paternal Grandfather   . Lung cancer Maternal Grandfather   . Hyperlipidemia Mother   . Hypertension Mother   . Cancer Mother        lung   . Diabetes Father   . Hyperlipidemia Maternal Grandmother   . Stroke Maternal Grandmother   . Cancer Paternal Grandmother   . Alcohol abuse Paternal Grandmother   . Early death Paternal Grandmother   . Healthy Son   . Healthy Daughter      Social History   Tobacco Use  . Smoking status: Former Smoker    Types: Cigarettes    Last attempt to quit: 08/05/2008    Years since quitting: 9.7  . Smokeless tobacco: Never Used  Substance Use Topics  . Alcohol use: No    Subjective:  Patient presents with concerns for 5 day of cough/ congestion; + occasional wheezing; + low grade fever; +night sweats; using OTC Nyquil with mild benefit; feels like congestion has "settled in chest." +sarcoidosis; would like to get CXR updated- recommended by pulmonology to get this done when sick;   Objective:  Vitals:   05/08/18 1122  BP: 108/70  Pulse: 80  Temp: 99.1 F (37.3 C)  TempSrc: Oral  SpO2: 98%  Weight: 170 lb 0.6 oz (77.1 kg)  Height: 5\' 3"  (1.6 m)    General: Well developed, well nourished, in no acute distress  Skin : Warm and dry.  Head: Normocephalic and atraumatic  Eyes: Sclera and conjunctiva clear; pupils round and reactive to light; extraocular movements intact  Ears: External normal; canals clear; tympanic membranes normal  Oropharynx: Pink, supple. No suspicious lesions  Neck: Supple without thyromegaly, adenopathy  Lungs: Respirations unlabored; clear to auscultation bilaterally without wheeze, rales, rhonchi  CVS exam: normal rate and regular rhythm.  Neurologic: Alert and oriented; speech intact; face symmetrical; moves all extremities well; CNII-XII intact without focal  deficit   Assessment:  1. Cough     Plan:  Suspect acute bronchitis/ sarcoidosis flare; will update CXR today; work note given as requested; Rx for Prednisone and Doxycycline sent to pharmacy;   No follow-ups on file.  Orders Placed This Encounter  Procedures  . DG Chest 2 View    Standing Status:   Future    Number of Occurrences:   1    Standing Expiration Date:   07/09/2019    Order Specific Question:   Reason for Exam (SYMPTOM  OR DIAGNOSIS REQUIRED)    Answer:   cough/ history of sarcoidosis    Order Specific Question:   Is  patient pregnant?    Answer:   No    Order Specific Question:   Preferred imaging location?    Answer:   [facility]    Order Specific Question:   Radiology Contrast Protocol - do NOT remove file path    Answer:   [email or web address]    Requested Prescriptions    No prescriptions requested or ordered in this encounter

## 2018-05-08 NOTE — Telephone Encounter (Signed)
Pt.reports she started with a non-productive cough 5 days ago. Has some chest tightness and some wheezing at intervals. Has tried some Nyquil, which "helped some." Reports she has sarcoid and " doesn't want to let this go." Denies any fever. Appointment made for today.  Reason for Disposition . [1] Continuous (nonstop) coughing interferes with work or school AND [2] no improvement using cough treatment per protocol  Answer Assessment - Initial Assessment Questions 1. ONSET: "When did the cough begin?"      Started 5 days ago 2. SEVERITY: "How bad is the cough today?"      Severe 3. RESPIRATORY DISTRESS: "Describe your breathing."      Some shortness breath 4. FEVER: "Do you have a fever?" If so, ask: "What is your temperature, how was it measured, and when did it start?"     No 5. HEMOPTYSIS: "Are you coughing up any blood?" If so ask: "How much?" (flecks, streaks, tablespoons, etc.)     No 6. TREATMENT: "What have you done so far to treat the cough?" (e.g., meds, fluids, humidifier)     Nyquil 7. CARDIAC HISTORY: "Do you have any history of heart disease?" (e.g., heart attack, congestive heart failure)      No 8. LUNG HISTORY: "Do you have any history of lung disease?"  (e.g., pulmonary embolus, asthma, emphysema)     Sarcoid 9. PE RISK FACTORS: "Do you have a history of blood clots?" (or: recent major surgery, recent prolonged travel, bedridden)     No 10. OTHER SYMPTOMS: "Do you have any other symptoms? (e.g., runny nose, wheezing, chest pain)       Wheezing at intervals 11. PREGNANCY: "Is there any chance you are pregnant?" "When was your last menstrual period?"       This month 12. TRAVEL: "Have you traveled out of the country in the last month?" (e.g., travel history, exposures)       No  Protocols used: COUGH - ACUTE NON-PRODUCTIVE-A-AH

## 2018-07-03 ENCOUNTER — Ambulatory Visit (INDEPENDENT_AMBULATORY_CARE_PROVIDER_SITE_OTHER): Payer: Managed Care, Other (non HMO)

## 2018-07-03 DIAGNOSIS — Z23 Encounter for immunization: Secondary | ICD-10-CM

## 2018-08-25 ENCOUNTER — Encounter: Payer: Self-pay | Admitting: Nurse Practitioner

## 2018-08-25 ENCOUNTER — Ambulatory Visit (INDEPENDENT_AMBULATORY_CARE_PROVIDER_SITE_OTHER): Payer: Managed Care, Other (non HMO) | Admitting: Nurse Practitioner

## 2018-08-25 VITALS — BP 124/68 | HR 79 | Temp 98.9°F | Ht 63.0 in | Wt 165.0 lb

## 2018-08-25 DIAGNOSIS — J069 Acute upper respiratory infection, unspecified: Secondary | ICD-10-CM

## 2018-08-25 MED ORDER — AZITHROMYCIN 250 MG PO TABS: ORAL_TABLET | ORAL | 0 refills | Status: DC

## 2018-08-25 NOTE — Patient Instructions (Addendum)
Honey, vicks, chloraseptic spray. Saline nasal spray used frequently. For drainage may use Allegra, Claritin or Zyrtec D hours. This may cause drowsiness. Ibuprofen 600 mg every 6 hours as needed for pain, discomfort or fever. Drink plenty of fluids and stay well-hydrated.   Upper Respiratory Infection, Adult An upper respiratory infection (URI) affects the nose, throat, and upper air passages. URIs are caused by germs (viruses). The most common type of URI is often called "the common cold." Medicines cannot cure URIs, but you can do things at home to relieve your symptoms. URIs usually get better within 7-10 days. Follow these instructions at home: Activity  Rest as needed.  If you have a fever, stay home from work or school until your fever is gone, or until your doctor says you may return to work or school. ? You should stay home until you cannot spread the infection anymore (you are not contagious). ? Your doctor may have you wear a face mask so you have less risk of spreading the infection. Relieving symptoms  Gargle with a salt-water mixture 3-4 times a day or as needed. To make a salt-water mixture, completely dissolve -1 tsp of salt in 1 cup of warm water.  Use a cool-mist humidifier to add moisture to the air. This can help you breathe more easily. Eating and drinking   Drink enough fluid to keep your pee (urine) pale yellow.  Eat soups and other clear broths. General instructions   Take over-the-counter and prescription medicines only as told by your doctor. These include cold medicines, fever reducers, and cough suppressants.  Do not use any products that contain nicotine or tobacco. These include cigarettes and e-cigarettes. If you need help quitting, ask your doctor.  Avoid being where people are smoking (avoid secondhand smoke).  Make sure you get regular shots and get the flu shot every year.  Keep all follow-up visits as told by your doctor. This is  important. How to avoid spreading infection to others   Wash your hands often with soap and water. If you do not have soap and water, use hand sanitizer.  Avoid touching your mouth, face, eyes, or nose.  Cough or sneeze into a tissue or your sleeve or elbow. Do not cough or sneeze into your hand or into the air. Contact a doctor if:  You are getting worse, not better.  You have any of these: ? A fever. ? Chills. ? Brown or red mucus in your nose. ? Yellow or brown fluid (discharge)coming from your nose. ? Pain in your face, especially when you bend forward. ? Swollen neck glands. ? Pain with swallowing. ? White areas in the back of your throat. Get help right away if:  You have shortness of breath that gets worse.  You have very bad or constant: ? Headache. ? Ear pain. ? Pain in your forehead, behind your eyes, and over your cheekbones (sinus pain). ? Chest pain.  You have long-lasting (chronic) lung disease along with any of these: ? Wheezing. ? Long-lasting cough. ? Coughing up blood. ? A change in your usual mucus.  You have a stiff neck.  You have changes in your: ? Vision. ? Hearing. ? Thinking. ? Mood. Summary  An upper respiratory infection (URI) is caused by a germ called a virus. The most common type of URI is often called "the common cold."  URIs usually get better within 7-10 days.  Take over-the-counter and prescription medicines only as told by your doctor. This  information is not intended to replace advice given to you by your health care provider. Make sure you discuss any questions you have with your health care provider. Document Released: 02/08/2008 Document Revised: 04/14/2017 Document Reviewed: 04/14/2017 Elsevier Interactive Patient Education  2019 [employer].

## 2018-08-25 NOTE — Progress Notes (Signed)
Nancy Villarreal is a 47 y.o. female with the following history as recorded in EpicCare:  Patient Active Problem List   Diagnosis Date Noted  . Upper respiratory infection, acute 06/10/2017  . Bilateral hand pain 01/13/2017  . Pain, joint, shoulder, left 01/13/2017  . Pain in left ankle and joints of left foot 01/13/2017  . High risk medication use 01/13/2017  . Nausea 08/09/2016  . Cough 03/09/2016  . Obesity (BMI 30-39.9) 01/22/2016  . Right foot pain 01/21/2016  . Routine general medical examination at a health care facility 12/11/2013  . Visit for screening mammogram 03/07/2012  . Anemia, iron deficiency 02/10/2012  . Sarcoidosis (HCC) 09/17/2008  . Asthma in remission 09/03/2008  . Allergic rhinitis due to pollen 09/03/2008    Current Outpatient Medications  Medication Sig Dispense Refill  . ferrous fumarate-iron polysaccharide complex (TANDEM) 162-115.2 MG CAPS capsule Take 1 capsule by mouth daily with breakfast. 30 capsule 11  . Multiple Vitamins-Minerals (MULTIVITAMIN WITH MINERALS) tablet Take 1 tablet by mouth daily. 30 tablet 11  . azithromycin (ZITHROMAX) 250 MG tablet Take 2 tablets on day 1, then 1 tablet daily until complete 6 tablet 0   No current facility-administered medications for this visit.     Allergies: Hydrocodone  Past Medical History:  Diagnosis Date  . Abnormal chest x-ray   . Anemia   . Hemoptysis   . Sarcoid    bronch negative 09-2008 ACE 93  . Unspecified asthma(493.90)     Past Surgical History:  Procedure Laterality Date  . BRONCHOSCOPY  09-2008   Negative  . CESAREAN SECTION    . TUBAL LIGATION     1995    Family History  Problem Relation Age of Onset  . Asthma Paternal Grandfather   . Lung cancer Maternal Grandfather   . Hyperlipidemia Mother   . Hypertension Mother   . Cancer Mother        lung   . Diabetes Father   . Hyperlipidemia Maternal Grandmother   . Stroke Maternal Grandmother   . Cancer Paternal Grandmother   .  Alcohol abuse Paternal Grandmother   . Early death Paternal Grandmother   . Healthy Son   . Healthy Daughter     Social History   Tobacco Use  . Smoking status: Former Smoker    Types: Cigarettes    Last attempt to quit: 08/05/2008    Years since quitting: 10.0  . Smokeless tobacco: Never Used  Substance Use Topics  . Alcohol use: No     Subjective:  Nancy Villarreal is here today for evaluation of acute complaint of cough/cold symptoms which first began about 3 days ago. She reports low fevers, body aches, sinus pain and pressure, nasal congestion, rhinorrhea, cough with yellow-green sputum. Denies: weakness, syncope, cp, sob, abd pain, n/v/d. Smoker? Former, quit 10 years ago Tried at home: theraflu with no relief No recent travel, works in job with public  ROS- See HPI  Objective:  Vitals:   08/25/18 1149  BP: 124/68  Pulse: 79  Temp: 98.9 F (37.2 C)  TempSrc: Oral  SpO2: 98%  Weight: 165 lb (74.8 kg)  Height: 5\' 3"  (1.6 m)    General: Well developed, well nourished, in no acute distress  Skin : Warm and dry.  Head: Normocephalic and atraumatic  Eyes: Sclera and conjunctiva clear; pupils round and reactive to light; extraocular movements intact  Ears: External normal; canals clear; tympanic membranes normal  Oropharynx: Posterior oropharyngeal erythema, no edema or  exudate. No suspicious lesions  Neck: Supple without thyromegaly, adenopathy  Lungs: Respirations unlabored; clear to auscultation bilaterally  CVS exam: normal rate and regular rhythm, S1 and S2 normal.  Extremities: No edema, cyanosis, clubbing  Vessels: Symmetric bilaterally  Neurologic: Alert and oriented; speech intact; face symmetrical; moves all extremities well; CNII-XII intact without focal deficit  Psychiatric: Normal mood and affect.   Assessment:  1. Upper respiratory tract infection, unspecified type     Plan:   PE, VS are stable Symptoms are likely viral at this point which we  discussed Home management, red flags and return precautions including when to seek immediate care discussed and printed on AVS Printed z-pak rx with instructions to start if symptoms persist for 1 week She was instructed to f/u for new, worsening or persistent symptoms  No follow-ups on file.  No orders of the defined types were placed in this encounter.   Requested Prescriptions   Signed Prescriptions Disp Refills  . azithromycin (ZITHROMAX) 250 MG tablet 6 tablet 0    Sig: Take 2 tablets on day 1, then 1 tablet daily until complete

## 2018-08-31 NOTE — Progress Notes (Deleted)
Office Visit Note  Patient: Nancy Villarreal             Date of Birth: [birth year]           MRN: [id number]             PCP: Olive BassMurray, Laura Woodruff, FNP Referring: Olive BassMurray, Laura Woodruff,* Visit Date: 09/13/2018 Occupation: @GUAROCC @  Subjective:  No chief complaint on file.   History of Present Illness: Nancy Villarreal is a 47 y.o. female ***   Activities of Daily Living:  Patient reports morning stiffness for *** {minute/hour:19697}.   Patient {ACTIONS;DENIES/REPORTS:21021675::"Denies"} nocturnal pain.  Difficulty dressing/grooming: {ACTIONS;DENIES/REPORTS:21021675::"Denies"} Difficulty climbing stairs: {ACTIONS;DENIES/REPORTS:21021675::"Denies"} Difficulty getting out of chair: {ACTIONS;DENIES/REPORTS:21021675::"Denies"} Difficulty using hands for taps, buttons, cutlery, and/or writing: {ACTIONS;DENIES/REPORTS:21021675::"Denies"}  No Rheumatology ROS completed.   PMFS History:  Patient Active Problem List   Diagnosis Date Noted  . Upper respiratory infection, acute 06/10/2017  . Bilateral hand pain 01/13/2017  . Pain, joint, shoulder, left 01/13/2017  . Pain in left ankle and joints of left foot 01/13/2017  . High risk medication use 01/13/2017  . Nausea 08/09/2016  . Cough 03/09/2016  . Obesity (BMI 30-39.9) 01/22/2016  . Right foot pain 01/21/2016  . Routine general medical examination at a health care facility 12/11/2013  . Visit for screening mammogram 03/07/2012  . Anemia, iron deficiency 02/10/2012  . Sarcoidosis (HCC) 09/17/2008  . Asthma in remission 09/03/2008  . Allergic rhinitis due to pollen 09/03/2008    Past Medical History:  Diagnosis Date  . Abnormal chest x-ray   . Anemia   . Hemoptysis   . Sarcoid    bronch negative 09-2008 ACE 93  . Unspecified asthma(493.90)     Family History  Problem Relation Age of Onset  . Asthma Paternal Grandfather   . Lung cancer Maternal Grandfather   . Hyperlipidemia Mother   . Hypertension Mother   . Cancer  Mother        lung   . Diabetes Father   . Hyperlipidemia Maternal Grandmother   . Stroke Maternal Grandmother   . Cancer Paternal Grandmother   . Alcohol abuse Paternal Grandmother   . Early death Paternal Grandmother   . Healthy Son   . Healthy Daughter    Past Surgical History:  Procedure Laterality Date  . BRONCHOSCOPY  09-2008   Negative  . CESAREAN SECTION    . TUBAL LIGATION     1995   Social History   Social History Narrative  . Not on file    Objective: Vital Signs: There were no vitals taken for this visit.   Physical Exam   Musculoskeletal Exam: ***  CDAI Exam: CDAI Score: Not documented Patient Global Assessment: Not documented; Provider Global Assessment: Not documented Swollen: Not documented; Tender: Not documented Joint Exam   Not documented   There is currently no information documented on the homunculus. Go to the Rheumatology activity and complete the homunculus joint exam.  Investigation: No additional findings.  Imaging: No results found.  Recent Labs: Lab Results  Component Value Date   WBC 5.3 02/20/2018   HGB 12.3 02/20/2018   PLT 184.0 02/20/2018   NA 135 02/20/2018   K 3.7 02/20/2018   CL 101 02/20/2018   CO2 24 02/20/2018   GLUCOSE 78 02/20/2018   BUN 12 02/20/2018   CREATININE 0.81 02/20/2018   BILITOT 0.3 02/20/2018   ALKPHOS 47 02/20/2018   AST 19 02/20/2018   ALT 10 02/20/2018   PROT 8.9 (H)  02/20/2018   ALBUMIN 4.1 02/20/2018   CALCIUM 9.4 02/20/2018   GFRAA 93 06/26/2017    Speciality Comments: No specialty comments available.  Procedures:  No procedures performed Allergies: Hydrocodone   Assessment / Plan:     Visit Diagnoses: Sarcoidosis - Dr. Jethro BastosYoungACE chronically elevated but stableCXR 12/07/17 chronic stable sarcoidosis changes noted  High risk medication use - d/c MTX in December 2018  Bunion of great toe of right foot  Abnormal laboratory test - elevated IgGDr. Clelia CroftShadad  History of anemia    Orders: No orders of the defined types were placed in this encounter.  No orders of the defined types were placed in this encounter.   Face-to-face time spent with patient was *** minutes. Greater than 50% of time was spent in counseling and coordination of care.  Follow-Up Instructions: No follow-ups on file.   Gearldine Bienenstockaylor M Dale, PA-C  Note - This record has been created using Dragon software.  Chart creation errors have been sought, but may not always  have been located. Such creation errors do not reflect on  the standard of medical care.

## 2018-09-13 ENCOUNTER — Ambulatory Visit: Payer: Managed Care, Other (non HMO) | Admitting: Physician Assistant

## 2018-10-12 ENCOUNTER — Ambulatory Visit: Payer: Managed Care, Other (non HMO) | Admitting: Physician Assistant

## 2018-11-19 ENCOUNTER — Ambulatory Visit (INDEPENDENT_AMBULATORY_CARE_PROVIDER_SITE_OTHER): Payer: BLUE CROSS/BLUE SHIELD | Admitting: Family

## 2018-11-19 ENCOUNTER — Other Ambulatory Visit: Payer: Self-pay

## 2018-11-19 ENCOUNTER — Encounter: Payer: Self-pay | Admitting: Family

## 2018-11-19 VITALS — BP 114/78 | HR 81 | Temp 98.8°F | Ht 63.0 in | Wt 163.1 lb

## 2018-11-19 DIAGNOSIS — J069 Acute upper respiratory infection, unspecified: Secondary | ICD-10-CM

## 2018-11-19 NOTE — Patient Instructions (Signed)
Viral Respiratory Infection  A viral respiratory infection is an illness that affects parts of the body that are used for breathing. These include the lungs, nose, and throat. It is caused by a germ called a virus.  Some examples of this kind of infection are:  · A cold.  · The flu (influenza).  · A respiratory syncytial virus (RSV) infection.  A person who gets this illness may have the following symptoms:  · A stuffy or runny nose.  · Yellow or green fluid in the nose.  · A cough.  · Sneezing.  · Tiredness (fatigue).  · Achy muscles.  · A sore throat.  · Sweating or chills.  · A fever.  · A headache.  Follow these instructions at home:  Managing pain and congestion  · Take over-the-counter and prescription medicines only as told by your doctor.  · If you have a sore throat, gargle with salt water. Do this 3-4 times per day or as needed. To make a salt-water mixture, dissolve ½-1 tsp of salt in 1 cup of warm water. Make sure that all the salt dissolves.  · Use nose drops made from salt water. This helps with stuffiness (congestion). It also helps soften the skin around your nose.  · Drink enough fluid to keep your pee (urine) pale yellow.  General instructions    · Rest as much as possible.  · Do not drink alcohol.  · Do not use any products that have nicotine or tobacco, such as cigarettes and e-cigarettes. If you need help quitting, ask your doctor.  · Keep all follow-up visits as told by your doctor. This is important.  How is this prevented?    · Get a flu shot every year. Ask your doctor when you should get your flu shot.  · Do not let other people get your germs. If you are sick:  ? Stay home from work or school.  ? Wash your hands with soap and water often. Wash your hands after you cough or sneeze. If soap and water are not available, use hand sanitizer.  · Avoid contact with people who are sick during cold and flu season. This is in fall and winter.  Get help if:  · Your symptoms last for 10 days or  longer.  · Your symptoms get worse over time.  · You have a fever.  · You have very bad pain in your face or forehead.  · Parts of your jaw or neck become very swollen.  Get help right away if:  · You feel pain or pressure in your chest.  · You have shortness of breath.  · You faint or feel like you will faint.  · You keep throwing up (vomiting).  · You feel confused.  Summary  · A viral respiratory infection is an illness that affects parts of the body that are used for breathing.  · Examples of this illness include a cold, the flu, and respiratory syncytial virus (RSV) infection.  · The infection can cause a runny nose, cough, sneezing, sore throat, and fever.  · Follow what your doctor tells you about taking medicines, drinking lots of fluid, washing your hands, resting at home, and avoiding people who are sick.  This information is not intended to replace advice given to you by your health care provider. Make sure you discuss any questions you have with your health care provider.  Document Released: 08/04/2008 Document Revised: 10/02/2017 Document Reviewed: 10/02/2017  Elsevier   Interactive Patient Education © 2019 [employer] Inc.

## 2018-11-19 NOTE — Progress Notes (Signed)
Nancy Villarreal is a 48 y.o. female with the following history as recorded in EpicCare:  Patient Active Problem List   Diagnosis Date Noted  . Upper respiratory infection, acute 06/10/2017  . Bilateral hand pain 01/13/2017  . Pain, joint, shoulder, left 01/13/2017  . Pain in left ankle and joints of left foot 01/13/2017  . High risk medication use 01/13/2017  . Nausea 08/09/2016  . Cough 03/09/2016  . Obesity (BMI 30-39.9) 01/22/2016  . Right foot pain 01/21/2016  . Routine general medical examination at a health care facility 12/11/2013  . Visit for screening mammogram 03/07/2012  . Anemia, iron deficiency 02/10/2012  . Sarcoidosis (HCC) 09/17/2008  . Asthma in remission 09/03/2008  . Allergic rhinitis due to pollen 09/03/2008    Current Outpatient Medications  Medication Sig Dispense Refill  . ferrous fumarate-iron polysaccharide complex (TANDEM) 162-115.2 MG CAPS capsule Take 1 capsule by mouth daily with breakfast. 30 capsule 11  . Multiple Vitamins-Minerals (MULTIVITAMIN WITH MINERALS) tablet Take 1 tablet by mouth daily. 30 tablet 11   No current facility-administered medications for this visit.     Allergies: Hydrocodone  Past Medical History:  Diagnosis Date  . Abnormal chest x-ray   . Anemia   . Hemoptysis   . Sarcoid    bronch negative 09-2008 ACE 93  . Unspecified asthma(493.90)     Past Surgical History:  Procedure Laterality Date  . BRONCHOSCOPY  09-2008   Negative  . CESAREAN SECTION    . TUBAL LIGATION     1995    Family History  Problem Relation Age of Onset  . Asthma Paternal Grandfather   . Lung cancer Maternal Grandfather   . Hyperlipidemia Mother   . Hypertension Mother   . Cancer Mother        lung   . Diabetes Father   . Hyperlipidemia Maternal Grandmother   . Stroke Maternal Grandmother   . Cancer Paternal Grandmother   . Alcohol abuse Paternal Grandmother   . Early death Paternal Grandmother   . Healthy Son   . Healthy Daughter      Social History   Tobacco Use  . Smoking status: Former Smoker    Types: Cigarettes    Last attempt to quit: 08/05/2008    Years since quitting: 10.2  . Smokeless tobacco: Never Used  Substance Use Topics  . 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  Head: Normocephalic and atraumatic  Eyes: Sclera and conjunctiva clear; pupils round and reactive to light; extraocular movements intact  Ears: External normal; canals clear; tympanic membranes normal  Oropharynx: Pink, supple. No suspicious lesions  Neck: Supple without thyromegaly, adenopathy  Lungs: Respirations unlabored; clear to auscultation bilaterally without wheeze, rales, rhonchi  CVS exam: normal rate and regular rhythm.  Neurologic: Alert and oriented; speech intact; face symmetrical; moves all extremities well; CNII-XII intact without focal deficit  Assessment:  1. Viral URI     Plan:  Rapid flu is negative; because she has not and is not running any type of fever, do not feel needs COVID testing today; however because her symptoms have only been present for a few hours, I have asked her to stay at home for the next 2 days to see how her symptoms progress; she understands to call back if she develops a fever and to go to ER if develops SOB with her fever.   No follow-ups on file.  No orders of the defined types were placed in this encounter.   Requested Prescriptions    No prescriptions requested or ordered  in this encounter

## 2018-11-21 ENCOUNTER — Telehealth: Payer: Self-pay | Admitting: Internal Medicine

## 2018-11-21 NOTE — Telephone Encounter (Signed)
See MyChart message in regards to this matter.

## 2018-11-21 NOTE — Telephone Encounter (Signed)
[name], this message was received for you this afternoon.   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 Social distancing is not an option here. I was just seen in primary care on Monday, and I was taken out of work for 2days, and returned to work today because I never developed a fever. Any guidance, advise, or medical instruction I could get would be greatly appreciated.   [name], please advise  Allergies  Allergen Reactions  . Hydrocodone Nausea And Vomiting   Current Outpatient Medications on File Prior to Visit  Medication Sig Dispense Refill  . ferrous fumarate-iron polysaccharide complex (TANDEM) 162-115.2 MG CAPS capsule Take 1 capsule by mouth daily with breakfast. 30 capsule 11  . Multiple Vitamins-Minerals (MULTIVITAMIN WITH MINERALS) tablet Take 1 tablet by mouth daily. 30 tablet 11   No current facility-administered medications on file prior to visit.

## 2018-11-22 NOTE — Telephone Encounter (Signed)
Suggest you wear face mask when meeting clients, keep hand sanitizer at your desk for use by you and client, wipe down surfaces like desk top, and talk with your manager about office policy. What are other employees doing?

## 2018-12-10 ENCOUNTER — Ambulatory Visit: Payer: Managed Care, Other (non HMO) | Admitting: Internal Medicine

## 2019-01-02 ENCOUNTER — Other Ambulatory Visit: Payer: Self-pay | Admitting: Family

## 2019-01-02 ENCOUNTER — Encounter: Payer: Self-pay | Admitting: Family

## 2019-01-02 DIAGNOSIS — Z113 Encounter for screening for infections with a predominantly sexual mode of transmission: Secondary | ICD-10-CM

## 2019-01-08 ENCOUNTER — Other Ambulatory Visit: Payer: Self-pay

## 2019-01-08 ENCOUNTER — Other Ambulatory Visit (INDEPENDENT_AMBULATORY_CARE_PROVIDER_SITE_OTHER): Payer: BLUE CROSS/BLUE SHIELD

## 2019-01-08 ENCOUNTER — Other Ambulatory Visit: Payer: Self-pay | Admitting: Emergency Medicine

## 2019-01-08 ENCOUNTER — Ambulatory Visit (INDEPENDENT_AMBULATORY_CARE_PROVIDER_SITE_OTHER): Payer: BLUE CROSS/BLUE SHIELD

## 2019-01-08 ENCOUNTER — Ambulatory Visit (INDEPENDENT_AMBULATORY_CARE_PROVIDER_SITE_OTHER): Payer: BLUE CROSS/BLUE SHIELD | Admitting: Internal Medicine

## 2019-01-08 ENCOUNTER — Encounter: Payer: Self-pay | Admitting: Internal Medicine

## 2019-01-08 VITALS — BP 112/62 | HR 66 | Ht 63.0 in | Wt 161.2 lb

## 2019-01-08 DIAGNOSIS — Z113 Encounter for screening for infections with a predominantly sexual mode of transmission: Secondary | ICD-10-CM | POA: Diagnosis not present

## 2019-01-08 DIAGNOSIS — D869 Sarcoidosis, unspecified: Secondary | ICD-10-CM

## 2019-01-08 DIAGNOSIS — E669 Obesity, unspecified: Secondary | ICD-10-CM

## 2019-01-08 LAB — BASIC METABOLIC PANEL
BUN: 12 mg/dL (ref 6–23)
CO2: 26 mEq/L (ref 19–32)
Calcium: 8.9 mg/dL (ref 8.4–10.5)
Chloride: 100 mEq/L (ref 96–112)
Creatinine, Ser: 0.76 mg/dL (ref 0.40–1.20)
GFR: 97.81 mL/min (ref >60.00)
Glucose, Bld: 84 mg/dL (ref 70–99)
Potassium: 3.8 mEq/L (ref 3.5–5.1)
Sodium: 134 mEq/L — ABNORMAL LOW (ref 135–145)

## 2019-01-08 LAB — CBC WITH DIFFERENTIAL/PLATELET
Basophils Absolute: 0 10*3/uL (ref 0.0–0.1)
Basophils Relative: 0.7 % (ref 0.0–3.0)
Eosinophils Absolute: 0 10*3/uL (ref 0.0–0.7)
Eosinophils Relative: 1.1 % (ref 0.0–5.0)
HCT: 36.6 % (ref 36.0–46.0)
Hemoglobin: 12.1 g/dL (ref 12.0–15.0)
Lymphocytes Relative: 38.1 % (ref 12.0–46.0)
Lymphs Abs: 1.3 10*3/uL (ref 0.7–4.0)
MCHC: 33 g/dL (ref 30.0–36.0)
MCV: 85.1 fl (ref 78.0–100.0)
Monocytes Absolute: 0.3 10*3/uL (ref 0.1–1.0)
Monocytes Relative: 9.7 % (ref 3.0–12.0)
Neutro Abs: 1.7 10*3/uL (ref 1.4–7.7)
Neutrophils Relative %: 50.4 % (ref 43.0–77.0)
Platelets: 146 10*3/uL — ABNORMAL LOW (ref 150.0–400.0)
RBC: 4.3 Mil/uL (ref 3.87–5.11)
RDW: 13 % (ref 11.5–15.5)
WBC: 3.4 10*3/uL — ABNORMAL LOW (ref 4.0–10.5)

## 2019-01-08 LAB — HEPATIC FUNCTION PANEL
ALT: 11 U/L (ref 0–35)
AST: 19 U/L (ref 0–37)
Albumin: 4 g/dL (ref 3.5–5.2)
Alkaline Phosphatase: 44 U/L (ref 39–117)
Bilirubin, Direct: 0 mg/dL (ref 0.0–0.3)
Total Bilirubin: 0.3 mg/dL (ref 0.2–1.2)
Total Protein: 8.5 g/dL — ABNORMAL HIGH (ref 6.0–8.3)

## 2019-01-08 NOTE — Progress Notes (Unsigned)
gc probe

## 2019-01-08 NOTE — Progress Notes (Signed)
Patient ID: Nancy Villarreal, female    DOB: 05-03-1971, 48 y.o.   MRN: [id number]  HPI F former smoker followed here with sarcoid Stage III,  Bronch NEG 09/24/08- dx based on mediastinal adenopathy/ interstitial disease,chronic elev ACE , response to steroid. Complicated by Iron def anemia, plasma cell disorder. Bronch NEG 09/24/08- dx based on mediastinal adenopathy/ interstitial disease, ACE 90, response to steroids. PFT 10/11/16- ACE 09/30/16- 86H Immunoglobulins 12/19/16- IgG 2,669 ---------------------------------------------------------------------------------------------------------  12/07/17- 48 yoF former smoker followed here with Sarcoid Stage III,  Bronch NEG 09/24/08- dx based on mediastinal adenopathy/ interstitial disease, chronic elevation of ACE , response to steroid. Complicated by Iron def anemia, plasma cell disorder No longer taking -MTX -dropped off 4 months ago. Has not heard from Dr Janell Quiet well, denying problems with general health.  Denies fever, cough, adenopathy, shortness of breath, rash. CXR image reviewed with her. CXR 12/19/16- Stable findings of sarcoidosis. No significant worsening since January.  01/08/2019- 48 yoF former smoker followed here with Sarcoid Stage III,  Bronch NEG 09/24/08- dx based on mediastinal adenopathy/ interstitial disease, chronic elevation of ACE , response to steroid. Complicated by Iron def anemia, plasma cell disorder -----breathing at baseline, no new or worsening symptoms Weight loss over the past year by diet and exercise.  Feels well.  Denies rash, adenopathy, cough.  Night sweats-perimenopausal.  ACE 12/07/17- 97, up from 89 in 2018. CXR 05/08/18 IMPRESSION: No acute interval change as compared with 12/07/2017. Chronic interstitial lung disease presumably corresponding to history of sarcoidosis. Weight down from 180 to 161 over past year   Review of Systems-see HPI   + = positive HEENT:   No headaches,  Difficulty swallowing,   Tooth/dental problems,  Sore throat,                No sneezing, itching, ear ache, nasal congestion, post nasal drip,  CV:  No- chest pain,  No-PND, swelling in lower extremities, anasarca, dizziness, palpitations GI  No heartburn, indigestion, abdominal pain, nausea, vomiting,  Resp:  No acute shortness of breath with exertion .  No excess mucus, no productive cough,                  non-productive cough,  No- recent coughing up of blood.  No change in color of mucus.  No wheezing.  Skin: no rash or lesions. GU: MS: joint pain .  No decreased range of motion.  No back pain. Psych:  No change in mood or affect. No depression or anxiety.  No memory loss.  Objective:   Physical Exam General- Alert, Oriented, Affect-appropriate, Distress- none acute. Skin-  no rash, lesions- none, excoriation- none Lymphadenopathy- none Head- atraumatic            Eyes- Gross vision intact, PERRLA, conjunctivae clear secretions            Ears- Hearing, canals normal            Nose- Clear, No-Septal dev, mucus, polyps, erosion, perforation             Throat- Mallampati II , mucosa-clear , drainage- none, tonsils present.                     Neck- flexible , trachea midline, no stridor , thyroid nl, carotid no bruit Chest - symmetrical excursion , unlabored           Heart/CV- RRR , no murmur , no gallop  , no rub, nl  s1 s2                           - JVD- none , edema- none, stasis changes- none, varices- none           Lung-   Cough-none, chest clear, unlabored, wheeze- none,  dullness-none, rub- none           Chest wall-  Abd-  Br/ Gen/ Rectal- Not done, not indicated Extrem- cyanosis- none, clubbing, none, atrophy- none, strength- nl Neuro- grossly intact to observation

## 2019-01-08 NOTE — Patient Instructions (Signed)
Order- CXR   Dx sarcoid               Lab- CBC w diff, BMET, Hepatic profile, Angiotensin Converting Enzyme Level     Dx sarcoid  Please call if we can help

## 2019-01-08 NOTE — Assessment & Plan Note (Signed)
She looks quite well after weight loss attributed to diet and exercise over the past year.

## 2019-01-08 NOTE — Assessment & Plan Note (Signed)
The night sweats, which may be perimenopausal, she is asymptomatic and feeling very well.  She has lost weight deliberately.  ACE was climbing at last check so we will recheck labs. Plan-ACE level, chemistries, CXR

## 2019-01-09 ENCOUNTER — Telehealth: Payer: Self-pay | Admitting: Internal Medicine

## 2019-01-09 LAB — ANGIOTENSIN CONVERTING ENZYME: Angiotensin-Converting Enzyme: 67 U/L (ref 9–67)

## 2019-01-09 LAB — RPR: RPR Ser Ql: NONREACTIVE

## 2019-01-09 LAB — HIV ANTIBODY (ROUTINE TESTING W REFLEX): HIV 1&2 Ab, 4th Generation: NONREACTIVE

## 2019-01-09 NOTE — Telephone Encounter (Signed)
Called & spoke w/ pt in regards to CY's results from CXR on 01/08/2019. Pt verbalized understanding with no additional questions. Nothing further needed at this time.

## 2019-01-12 LAB — GC/CHLAMYDIA PROBE AMP
Chlamydia trachomatis, NAA: NEGATIVE
Neisseria Gonorrhoeae by PCR: NEGATIVE

## 2019-01-18 ENCOUNTER — Other Ambulatory Visit: Payer: Self-pay | Admitting: Family

## 2019-01-18 MED ORDER — MULTI-VITAMIN/MINERALS PO TABS: 1.0000 | ORAL_TABLET | Freq: Every day | ORAL | 11 refills | Status: DC

## 2019-02-06 ENCOUNTER — Other Ambulatory Visit: Payer: Self-pay | Admitting: Family

## 2019-02-06 DIAGNOSIS — Z1231 Encounter for screening mammogram for malignant neoplasm of breast: Secondary | ICD-10-CM

## 2019-02-27 ENCOUNTER — Encounter: Payer: BLUE CROSS/BLUE SHIELD | Admitting: Family

## 2019-02-27 ENCOUNTER — Encounter: Payer: Self-pay | Admitting: Family

## 2019-02-27 ENCOUNTER — Ambulatory Visit (INDEPENDENT_AMBULATORY_CARE_PROVIDER_SITE_OTHER): Payer: BC Managed Care – PPO | Admitting: Family

## 2019-02-27 ENCOUNTER — Other Ambulatory Visit (INDEPENDENT_AMBULATORY_CARE_PROVIDER_SITE_OTHER): Payer: BC Managed Care – PPO

## 2019-02-27 ENCOUNTER — Other Ambulatory Visit: Payer: Self-pay

## 2019-02-27 VITALS — BP 114/74 | HR 92 | Temp 98.6°F | Ht 63.0 in | Wt 159.1 lb

## 2019-02-27 DIAGNOSIS — Z13220 Encounter for screening for lipoid disorders: Secondary | ICD-10-CM | POA: Diagnosis not present

## 2019-02-27 DIAGNOSIS — Z0000 Encounter for general adult medical examination without abnormal findings: Secondary | ICD-10-CM | POA: Diagnosis not present

## 2019-02-27 DIAGNOSIS — E559 Vitamin D deficiency, unspecified: Secondary | ICD-10-CM | POA: Diagnosis not present

## 2019-02-27 DIAGNOSIS — D869 Sarcoidosis, unspecified: Secondary | ICD-10-CM

## 2019-02-27 LAB — LIPID PANEL
Cholesterol: 209 mg/dL — ABNORMAL HIGH (ref 0–200)
HDL: 83.7 mg/dL (ref >39.00)
LDL Cholesterol: 112 mg/dL — ABNORMAL HIGH (ref 0–99)
NonHDL: 125.5
Total CHOL/HDL Ratio: 2
Triglycerides: 66 mg/dL (ref 0.0–149.0)
VLDL: 13.2 mg/dL (ref 0.0–40.0)

## 2019-02-27 LAB — CBC WITH DIFFERENTIAL/PLATELET
Basophils Absolute: 0 10*3/uL (ref 0.0–0.1)
Basophils Relative: 0.5 % (ref 0.0–3.0)
Eosinophils Absolute: 0.1 10*3/uL (ref 0.0–0.7)
Eosinophils Relative: 1.9 % (ref 0.0–5.0)
HCT: 36.8 % (ref 36.0–46.0)
Hemoglobin: 12 g/dL (ref 12.0–15.0)
Lymphocytes Relative: 39.1 % (ref 12.0–46.0)
Lymphs Abs: 1.4 10*3/uL (ref 0.7–4.0)
MCHC: 32.6 g/dL (ref 30.0–36.0)
MCV: 86.5 fl (ref 78.0–100.0)
Monocytes Absolute: 0.5 10*3/uL (ref 0.1–1.0)
Monocytes Relative: 14.7 % — ABNORMAL HIGH (ref 3.0–12.0)
Neutro Abs: 1.5 10*3/uL (ref 1.4–7.7)
Neutrophils Relative %: 43.8 % (ref 43.0–77.0)
Platelets: 119 10*3/uL — ABNORMAL LOW (ref 150.0–400.0)
RBC: 4.25 Mil/uL (ref 3.87–5.11)
RDW: 12.4 % (ref 11.5–15.5)
WBC: 3.5 10*3/uL — ABNORMAL LOW (ref 4.0–10.5)

## 2019-02-27 LAB — COMPREHENSIVE METABOLIC PANEL
ALT: 10 U/L (ref 0–35)
AST: 19 U/L (ref 0–37)
Albumin: 3.7 g/dL (ref 3.5–5.2)
Alkaline Phosphatase: 37 U/L — ABNORMAL LOW (ref 39–117)
BUN: 10 mg/dL (ref 6–23)
CO2: 26 mEq/L (ref 19–32)
Calcium: 8.8 mg/dL (ref 8.4–10.5)
Chloride: 104 mEq/L (ref 96–112)
Creatinine, Ser: 0.71 mg/dL (ref 0.40–1.20)
GFR: 105.75 mL/min (ref >60.00)
Glucose, Bld: 88 mg/dL (ref 70–99)
Potassium: 4.2 mEq/L (ref 3.5–5.1)
Sodium: 136 mEq/L (ref 135–145)
Total Bilirubin: 0.4 mg/dL (ref 0.2–1.2)
Total Protein: 7.9 g/dL (ref 6.0–8.3)

## 2019-02-27 LAB — VITAMIN D 25 HYDROXY (VIT D DEFICIENCY, FRACTURES): VITD: 33.28 ng/mL (ref 30.00–100.00)

## 2019-02-27 LAB — TSH: TSH: 1.46 u[IU]/mL (ref 0.35–4.50)

## 2019-02-27 NOTE — Progress Notes (Signed)
Nancy Villarreal is a 48 y.o. female with the following history as recorded in EpicCare:  Patient Active Problem List   Diagnosis Date Noted  . Upper respiratory infection, acute 06/10/2017  . Bilateral hand pain 01/13/2017  . Pain, joint, shoulder, left 01/13/2017  . Pain in left ankle and joints of left foot 01/13/2017  . High risk medication use 01/13/2017  . Nausea 08/09/2016  . Cough 03/09/2016  . Obesity (BMI 30-39.9) 01/22/2016  . Right foot pain 01/21/2016  . Routine general medical examination at a health care facility 12/11/2013  . Visit for screening mammogram 03/07/2012  . Anemia, iron deficiency 02/10/2012  . Sarcoidosis ([place]) 09/17/2008  . Asthma in remission 09/03/2008  . Allergic rhinitis due to pollen 09/03/2008    Current Outpatient Medications  Medication Sig Dispense Refill  . ferrous fumarate-iron polysaccharide complex (TANDEM) 162-115.2 MG CAPS capsule Take 1 capsule by mouth daily with breakfast. 30 capsule 11  . Multiple Vitamins-Minerals (MULTIVITAMIN WITH MINERALS) tablet Take 1 tablet by mouth daily. 30 tablet 11   No current facility-administered medications for this visit.     Allergies: Hydrocodone  Past Medical History:  Diagnosis Date  . Abnormal chest x-ray   . Anemia   . Hemoptysis   . Sarcoid    bronch negative 09-2008 ACE 93  . Unspecified asthma(493.90)     Past Surgical History:  Procedure Laterality Date  . BRONCHOSCOPY  09-2008   Negative  . CESAREAN SECTION    . TUBAL LIGATION     1995    Family History  Problem Relation Age of Onset  . Asthma Paternal Grandfather   . Lung cancer Maternal Grandfather   . Hyperlipidemia Mother   . Hypertension Mother   . Cancer Mother        lung   . Diabetes Father   . Hyperlipidemia Maternal Grandmother   . Stroke Maternal Grandmother   . Cancer Paternal Grandmother   . Alcohol abuse Paternal Grandmother   . Early death Paternal Grandmother   . Healthy Son   . Healthy Daughter      Social History   Tobacco Use  . Smoking status: Former Smoker    Types: Cigarettes    Quit date: 08/05/2008    Years since quitting: 10.5  . Smokeless tobacco: Never Used  Substance Use Topics  . Alcohol use: No    Subjective:  Patient presents today for her yearly CPE; continuing to work with her pulmonologist for management of her sarcoidosis;  Pap smear done in 2019- repeat in 2022; mammogram scheduled for next month;  Has lost 20+ pounds in the past year- working out 3-4 x per week/ eating better- "anti-inflammatory"; notes she has been "feeling great" with the lifestyle changes;  Up to date with dental and eye exams;   Review of Systems  Constitutional: Positive for weight loss.       Planned  HENT: Negative.   Eyes: Negative.   Respiratory: Negative for shortness of breath.   Cardiovascular: Negative for chest pain.  Gastrointestinal: Negative for abdominal pain, constipation and diarrhea.  Genitourinary: Negative.   Musculoskeletal: Negative for myalgias.  Skin: Negative.   Neurological: Negative for dizziness.  Endo/Heme/Allergies: Negative.   Psychiatric/Behavioral: Negative for depression. The patient is not nervous/anxious.       Objective:  Vitals:   02/27/19 0940  BP: 114/74  Pulse: 92  Temp: 98.6 F (37 C)  TempSrc: Oral  SpO2: 97%  Weight: 159 lb 1.3 oz (72.2  kg)  Height: 5' 3"  (1.6 m)    General: Well developed, well nourished, in no acute distress  Skin : Warm and dry.  Head: Normocephalic and atraumatic  Eyes: Sclera and conjunctiva clear; pupils round and reactive to light; extraocular movements intact  Ears: External normal; canals clear; tympanic membranes normal  Oropharynx: Pink, supple. No suspicious lesions  Neck: Supple without thyromegaly, adenopathy  Lungs: Respirations unlabored; clear to auscultation bilaterally without wheeze, rales, rhonchi  CVS exam: normal rate and regular rhythm.  Abdomen: Soft; nontender; nondistended;  normoactive bowel sounds; no masses or hepatosplenomegaly  Musculoskeletal: No deformities; no active joint inflammation  Extremities: No edema, cyanosis, clubbing  Vessels: Symmetric bilaterally  Neurologic: Alert and oriented; speech intact; face symmetrical; moves all extremities well; CNII-XII intact without focal deficit  Assessment:  1. PE (physical exam), annual   2. Lipid screening   3. Sarcoidosis ([place])   4. Vitamin D deficiency     Plan:  Age appropriate preventive healthcare needs addressed; congratulated patient on her commitment to healthcare; encouraged regular eye doctor and dental exams; encouraged to continue regular exercise; will update labs and refills as needed today; follow-up to be determined; She will talk to her pulmonologist about the Prevnar vaccine next year; plan for flu shot later this year.   No follow-ups on file.  Orders Placed This Encounter  Procedures  . CBC w/Diff    Standing Status:   Future    Standing Expiration Date:   02/27/2020  . Comp Met (CMET)    Standing Status:   Future    Standing Expiration Date:   02/27/2020  . Lipid panel    Standing Status:   Future    Standing Expiration Date:   02/27/2020  . Iron, TIBC and Ferritin Panel    Standing Status:   Future    Standing Expiration Date:   02/27/2020  . Vitamin D (25 hydroxy)    Standing Status:   Future    Standing Expiration Date:   02/27/2020  . TSH    Standing Status:   Future    Standing Expiration Date:   02/27/2020    Requested Prescriptions    No prescriptions requested or ordered in this encounter

## 2019-02-28 LAB — IRON,TIBC AND FERRITIN PANEL
%SAT: 45 % (calc) (ref 16–45)
Ferritin: 46 ng/mL (ref 16–232)
Iron: 121 ug/dL (ref 40–190)
TIBC: 268 mcg/dL (calc) (ref 250–450)

## 2019-03-20 ENCOUNTER — Ambulatory Visit: Payer: BLUE CROSS/BLUE SHIELD | Admitting: Internal Medicine

## 2019-03-21 ENCOUNTER — Encounter: Payer: Self-pay | Admitting: Family

## 2019-03-21 ENCOUNTER — Other Ambulatory Visit: Payer: Self-pay

## 2019-03-21 ENCOUNTER — Other Ambulatory Visit: Payer: Self-pay | Admitting: Internal Medicine

## 2019-03-21 ENCOUNTER — Telehealth: Payer: Self-pay

## 2019-03-21 DIAGNOSIS — Z20822 Contact with and (suspected) exposure to covid-19: Secondary | ICD-10-CM

## 2019-03-21 DIAGNOSIS — Z20828 Contact with and (suspected) exposure to other viral communicable diseases: Secondary | ICD-10-CM

## 2019-03-21 DIAGNOSIS — R6889 Other general symptoms and signs: Secondary | ICD-10-CM | POA: Diagnosis not present

## 2019-03-21 NOTE — Telephone Encounter (Signed)
Note sent to Mickel Baas about me setting up patient for testing today.

## 2019-03-22 ENCOUNTER — Encounter: Payer: Self-pay | Admitting: Family

## 2019-03-22 ENCOUNTER — Other Ambulatory Visit: Payer: Self-pay | Admitting: Family

## 2019-03-23 LAB — NOVEL CORONAVIRUS, NAA: SARS-CoV-2, NAA: NOT DETECTED

## 2019-03-25 ENCOUNTER — Telehealth: Payer: Self-pay | Admitting: Hematology

## 2019-03-25 ENCOUNTER — Encounter: Payer: Self-pay | Admitting: Family

## 2019-03-25 NOTE — Telephone Encounter (Signed)
Pt is calling and she is aware covid 19 test is negative °

## 2019-03-27 ENCOUNTER — Other Ambulatory Visit: Payer: Self-pay

## 2019-03-27 ENCOUNTER — Ambulatory Visit
Admission: RE | Admit: 2019-03-27 | Discharge: 2019-03-27 | Disposition: A | Discharge status: Home / Self Care | Payer: BC Managed Care – PPO | Source: Ambulatory Visit | Attending: Family | Admitting: Family

## 2019-03-27 DIAGNOSIS — Z1231 Encounter for screening mammogram for malignant neoplasm of breast: Secondary | ICD-10-CM

## 2019-03-31 IMAGING — DX DG CHEST 2V
2 series · 2 of 2 positions shown · non-contrast
Comparison: 12/07/2017, 06/08/2017, CT chest 10/27/2016

CLINICAL DATA: Cough, History of sarcoid

EXAM:
CHEST - 2 VIEW

[chest pa]
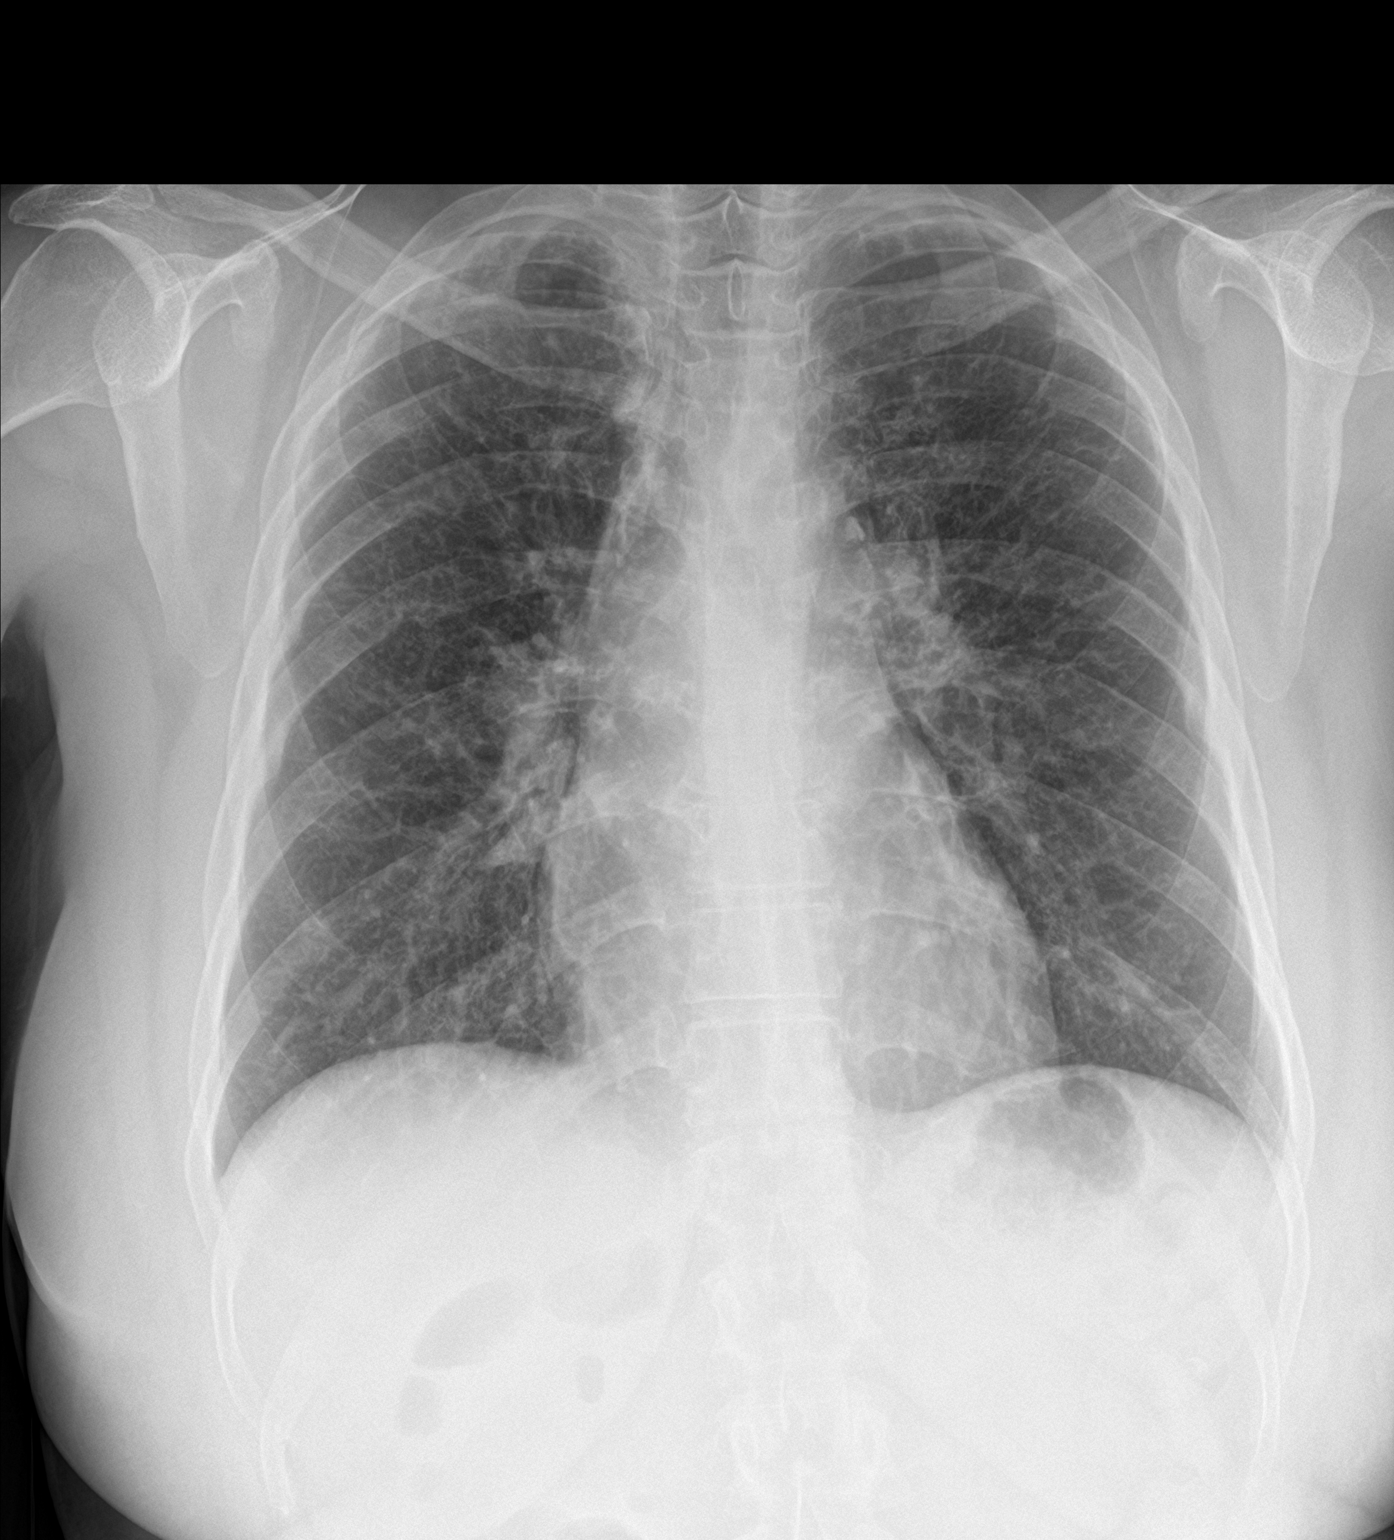

[chest lat]
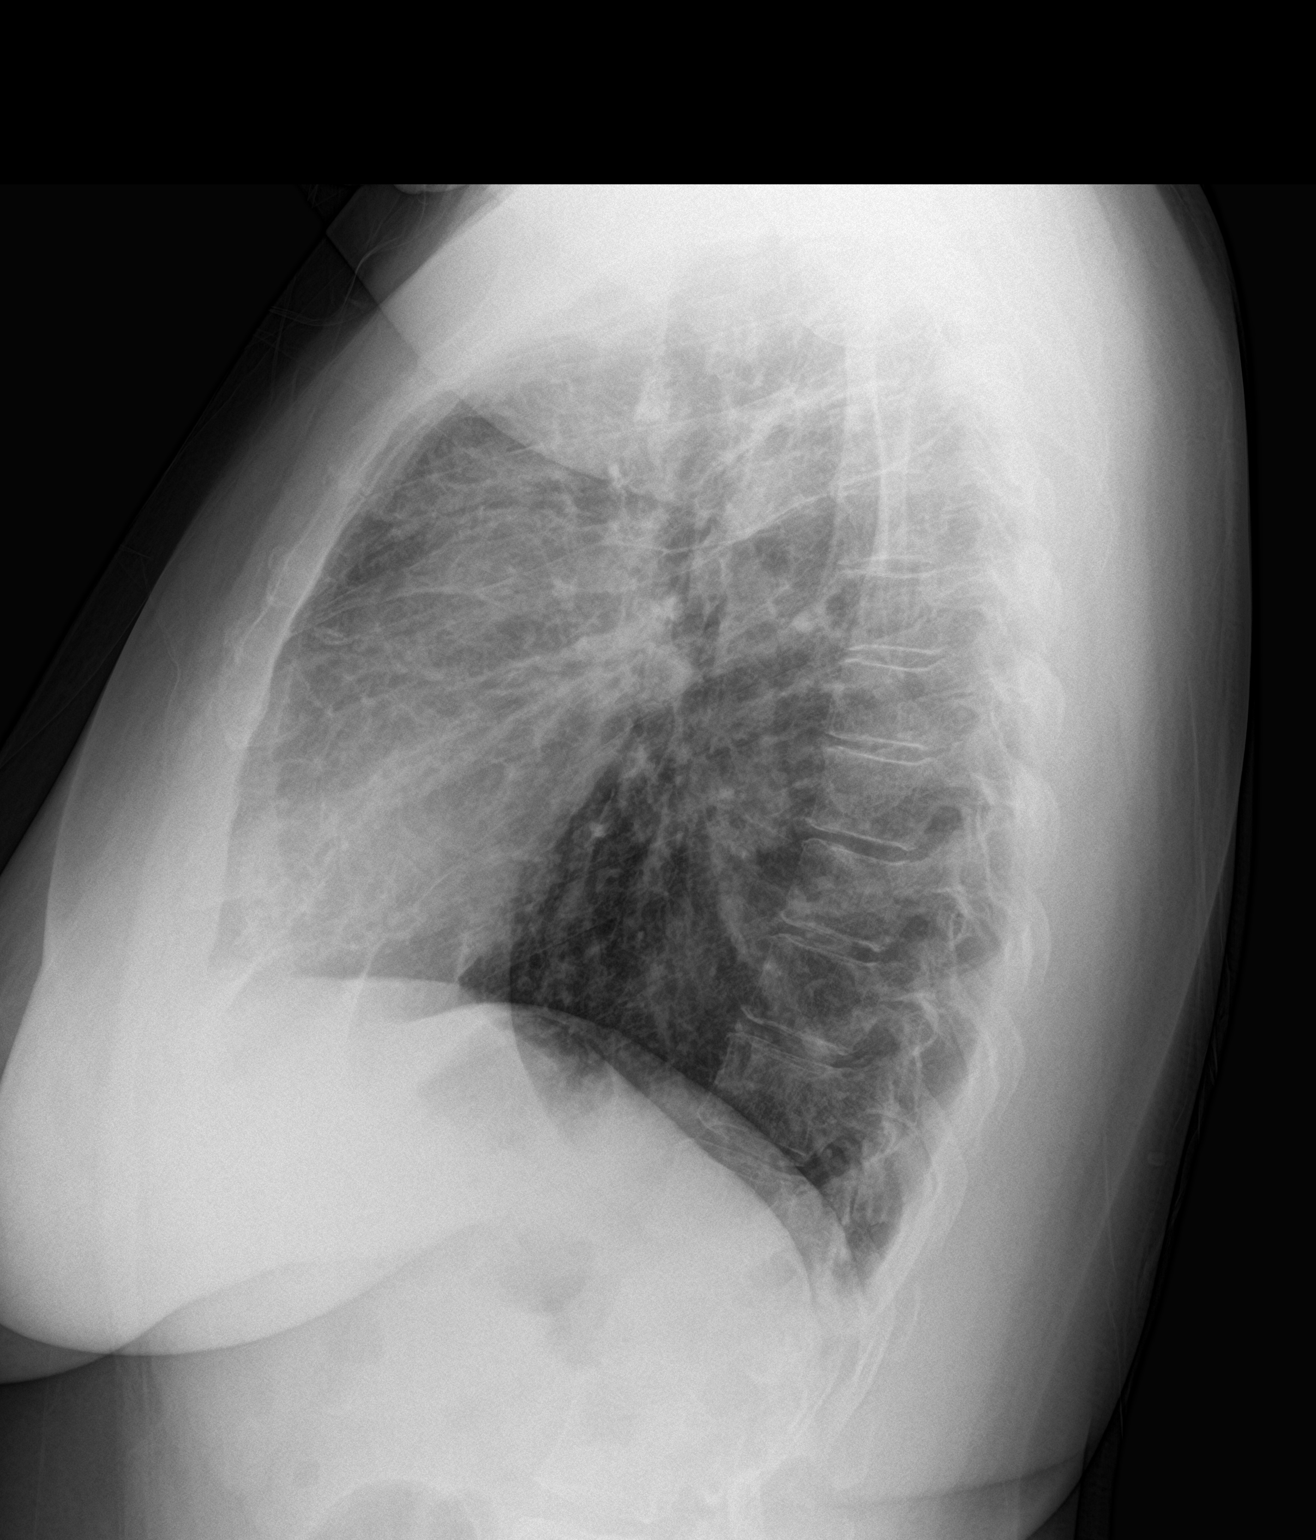

[2 of 2 positions shown; findings below may reference images not displayed]

FINDINGS: Chronic interstitial lung disease corresponding to history of
sarcoid. No acute airspace disease or pleural effusion. Stable
cardiomediastinal silhouette. No pneumothorax.
IMPRESSION: No acute interval change as compared with 12/07/2017. Chronic
interstitial lung disease presumably corresponding to history of
sarcoidosis.

## 2019-05-20 NOTE — Telephone Encounter (Signed)
CY - please advise. Thanks! 

## 2019-05-20 NOTE — Telephone Encounter (Signed)
It is no too early for flu shot, and with the Covid virus issues confusing everything, I would go ahead and get it when you can.

## 2019-05-24 ENCOUNTER — Telehealth: Payer: Self-pay | Admitting: Internal Medicine

## 2019-05-24 MED ORDER — PREDNISONE 20 MG PO TABS: 20.0000 mg | ORAL_TABLET | Freq: Every day | ORAL | 0 refills | Status: DC

## 2019-05-24 MED ORDER — ALBUTEROL SULFATE HFA 108 (90 BASE) MCG/ACT IN AERS: 2.0000 | INHALATION_SPRAY | Freq: Four times a day (QID) | RESPIRATORY_TRACT | 1 refills | Status: DC | PRN

## 2019-05-24 NOTE — Addendum Note (Signed)
Addended by: Amado Coe on: 05/24/2019 02:18 PM   Modules accepted: Orders

## 2019-05-24 NOTE — Telephone Encounter (Signed)
Ok  To send  Prednisone, 20 mg, # 5, 1 daily  Albuterol hfa  # 1, inhale 2 puffs every 6 hours if needed, ref x 1

## 2019-05-24 NOTE — Telephone Encounter (Signed)
Spoke with pt. She has been scheduled for her flu shot on 05/31/2019 at 0900. Nothing further was needed.

## 2019-05-24 NOTE — Telephone Encounter (Signed)
Patient sent e-mail today stating   "Is it possible to get a prescription for a low dose of prednisone and a rescue inhaler to have available for emergency situations? Just trying to be proactive to get through the season with no issues. Typically this time of year I either have a flare from sarcoid or bronchitis"  Allergies  Allergen Reactions  . Hydrocodone Nausea And Vomiting   Current Outpatient Medications on File Prior to Visit  Medication Sig Dispense Refill  . ferrous fumarate-iron polysaccharide complex (TANDEM) 162-115.2 MG CAPS capsule Take 1 capsule by mouth daily with breakfast. 30 capsule 11  . Multiple Vitamins-Minerals (MULTIVITAMIN WITH MINERALS) tablet Take 1 tablet by mouth daily. 30 tablet 11   No current facility-administered medications on file prior to visit.        CY please advise

## 2019-05-31 ENCOUNTER — Ambulatory Visit (INDEPENDENT_AMBULATORY_CARE_PROVIDER_SITE_OTHER): Payer: BC Managed Care – PPO

## 2019-05-31 ENCOUNTER — Other Ambulatory Visit: Payer: Self-pay

## 2019-05-31 DIAGNOSIS — Z23 Encounter for immunization: Secondary | ICD-10-CM

## 2019-06-21 ENCOUNTER — Other Ambulatory Visit: Payer: Self-pay | Admitting: Family

## 2019-06-21 MED ORDER — FERROUS FUM-IRON POLYSACCH 162-115.2 MG PO CAPS: 1.0000 | ORAL_CAPSULE | Freq: Every day | ORAL | 11 refills | Status: DC

## 2019-07-03 ENCOUNTER — Other Ambulatory Visit: Payer: Self-pay | Admitting: Internal Medicine

## 2019-07-30 DIAGNOSIS — H5213 Myopia, bilateral: Secondary | ICD-10-CM | POA: Diagnosis not present

## 2019-07-30 DIAGNOSIS — D869 Sarcoidosis, unspecified: Secondary | ICD-10-CM | POA: Diagnosis not present

## 2019-10-25 ENCOUNTER — Other Ambulatory Visit: Payer: Self-pay

## 2019-10-25 ENCOUNTER — Ambulatory Visit (INDEPENDENT_AMBULATORY_CARE_PROVIDER_SITE_OTHER): Payer: Self-pay | Admitting: Family

## 2019-10-25 DIAGNOSIS — H938X1 Other specified disorders of right ear: Secondary | ICD-10-CM

## 2019-10-25 NOTE — Progress Notes (Signed)
Nancy Villarreal is a 49 y.o. female with the following history as recorded in EpicCare:  Patient Active Problem List   Diagnosis Date Noted  . Upper respiratory infection, acute 06/10/2017  . Bilateral hand pain 01/13/2017  . Pain, joint, shoulder, left 01/13/2017  . Pain in left ankle and joints of left foot 01/13/2017  . High risk medication use 01/13/2017  . Nausea 08/09/2016  . Cough 03/09/2016  . Obesity (BMI 30-39.9) 01/22/2016  . Right foot pain 01/21/2016  . Routine general medical examination at a health care facility 12/11/2013  . Visit for screening mammogram 03/07/2012  . Anemia, iron deficiency 02/10/2012  . Sarcoidosis ([facility]) 09/17/2008  . Asthma in remission 09/03/2008  . Allergic rhinitis due to pollen 09/03/2008    Current Outpatient Medications  Medication Sig Dispense Refill  . albuterol (VENTOLIN HFA) 108 (90 Base) MCG/ACT inhaler TAKE 2 PUFFS BY MOUTH EVERY 6 HOURS AS NEEDED FOR WHEEZE OR SHORTNESS OF BREATH 6.7 g 1  . ferrous fumarate-iron polysaccharide complex (TANDEM) 162-115.2 MG CAPS capsule Take 1 capsule by mouth daily with breakfast. 30 capsule 11  . Multiple Vitamins-Minerals (MULTIVITAMIN WITH MINERALS) tablet Take 1 tablet by mouth daily. 30 tablet 11  . predniSONE (DELTASONE) 20 MG tablet Take 1 tablet (20 mg total) by mouth daily with breakfast. 5 tablet 0   No current facility-administered medications for this visit.    Allergies: Hydrocodone  Past Medical History:  Diagnosis Date  . Abnormal chest x-ray   . Anemia   . Hemoptysis   . Sarcoid    bronch negative 09-2008 ACE 93  . Unspecified asthma(493.90)     Past Surgical History:  Procedure Laterality Date  . BRONCHOSCOPY  09-2008   Negative  . CESAREAN SECTION    . TUBAL LIGATION     1995    Family History  Problem Relation Age of Onset  . Asthma Paternal Grandfather   . Lung cancer Maternal Grandfather   . Hyperlipidemia Mother   . Hypertension Mother   . Cancer Mother    lung   . Diabetes Father   . Hyperlipidemia Maternal Grandmother   . Stroke Maternal Grandmother   . Cancer Paternal Grandmother   . Alcohol abuse Paternal Grandmother   . Early death Paternal Grandmother   . Healthy Son   . Healthy Daughter     Social History   Tobacco Use  . Smoking status: Former Smoker    Types: Cigarettes    Quit date: 08/05/2008    Years since quitting: 11.2  . Smokeless tobacco: Never Used  Substance Use Topics  . Alcohol use: No    Subjective:   I connected with Nancy Villarreal on 10/25/19 at 10:00 AM EST by a video enabled telemedicine application and verified that I am speaking with the correct person using two identifiers.   I discussed the limitations of evaluation and management by telemedicine and the availability of in person appointments. The patient expressed understanding and agreed to proceed. Provider is at home/ patient is in office; provider and patient are only 2 people on video call.   Patient is concerned that she has ear wax in her right ear; feels that her ear is full especially at night when she lies down; denies any pain or sinus pain/ pressure; no known dental issues- just had a dental check up; has not tried any OTC treatment options; not prone to problems with ear wax in general.    Objective:  There were  no vitals filed for this visit.  General: Well developed, well nourished, in no acute distress  Lungs: Respirations unlabored;  Neurologic: Alert and oriented; speech intact; face symmetrical;    Assessment:  1. Sensation of fullness in right ear     Plan:  Patient is suspicious that she has ear wax in right ear; recommend that she try OTC Debrox and will need in office appointment if symptoms persist.   No follow-ups on file.  No orders of the defined types were placed in this encounter.   Requested Prescriptions    No prescriptions requested or ordered in this encounter

## 2020-01-08 ENCOUNTER — Ambulatory Visit: Payer: BLUE CROSS/BLUE SHIELD | Admitting: Internal Medicine

## 2020-02-13 ENCOUNTER — Ambulatory Visit (INDEPENDENT_AMBULATORY_CARE_PROVIDER_SITE_OTHER): Payer: BC Managed Care – PPO

## 2020-02-13 ENCOUNTER — Encounter: Payer: Self-pay | Admitting: Internal Medicine

## 2020-02-13 ENCOUNTER — Ambulatory Visit (INDEPENDENT_AMBULATORY_CARE_PROVIDER_SITE_OTHER): Payer: BC Managed Care – PPO | Admitting: Internal Medicine

## 2020-02-13 ENCOUNTER — Other Ambulatory Visit: Payer: Self-pay

## 2020-02-13 VITALS — BP 108/64 | HR 89 | Temp 98.8°F | Ht 62.0 in | Wt 164.4 lb

## 2020-02-13 DIAGNOSIS — D869 Sarcoidosis, unspecified: Secondary | ICD-10-CM

## 2020-02-13 DIAGNOSIS — J45998 Other asthma: Secondary | ICD-10-CM

## 2020-02-13 NOTE — Assessment & Plan Note (Signed)
Not needing or using her rescue inhaler, with no wheeze or cough Plan- leaving albuterol on med list, but won't refill at this time.

## 2020-02-13 NOTE — Patient Instructions (Signed)
Order - CXR   Dx Sarcoid  Please call if we can help

## 2020-02-13 NOTE — Progress Notes (Signed)
Patient ID: Nancy Villarreal, female    DOB: 04/13/1971, 49 y.o.   MRN: [id number]  HPI F former smoker followed here with sarcoid Stage III,  Bronch NEG 09/24/08- dx based on mediastinal adenopathy/ interstitial disease,chronic elev ACE , response to steroid. Complicated by Iron def anemia, plasma cell disorder. Bronch NEG 09/24/08- dx based on mediastinal adenopathy/ interstitial disease, ACE 90, response to steroids. PFT 10/11/16- ACE 09/30/16- [place] Immunoglobulins 12/19/16- IgG 2,669 ---------------------------------------------------------------------------------------------------------   01/08/2019- 48 yoF former smoker followed here with Sarcoid Stage III,  Bronch NEG 09/24/08- dx based on mediastinal adenopathy/ interstitial disease, chronic elevation of ACE , response to steroid. Complicated by Iron def anemia, plasma cell disorder -----breathing at baseline, no new or worsening symptoms Weight loss over the past year by diet and exercise.  Feels well.  Denies rash, adenopathy, cough.  Night sweats-perimenopausal.  ACE 12/07/17- 97, up from 89 in 2018. CXR 05/08/18 IMPRESSION: No acute interval change as compared with 12/07/2017. Chronic interstitial lung disease presumably corresponding to history of sarcoidosis. Weight down from 180 to 161 over past year  02/13/20- 49 yoF former smoker followed here with Sarcoid Stage III,  Bronch NEG 09/24/08- dx based on mediastinal adenopathy/ interstitial disease, chronic elevation of ACE , response to steroid. Complicated by Iron def anemia, plasma cell disorder Albuterol hfa Weight holding at 164 lbs. Denies cough, adenopathy, change in scar or rash.Feels well and denies interval significant health issues.  ACE 01/08/19- 67 WNL CXR 01/08/19- IMPRESSION: Similar-appearing chronic findings compatible with history of sarcoidosis.   Review of Systems-see HPI   + = positive HEENT:   No headaches,  Difficulty swallowing,  Tooth/dental problems,  Sore throat,                 No sneezing, itching, ear ache, nasal congestion, post nasal drip,  CV:  No- chest pain,  No-PND, swelling in lower extremities, anasarca, dizziness, palpitations GI  No heartburn, indigestion, abdominal pain, nausea, vomiting,  Resp:  No acute shortness of breath with exertion .  No excess mucus, no productive cough,                  non-productive cough,  No- recent coughing up of blood.  No change in color of mucus.  No wheezing.  Skin: no rash or lesions. GU: MS: joint pain .  No decreased range of motion.  No back pain. Psych:  No change in mood or affect. No depression or anxiety.  No memory loss.  Objective:   Physical Exam General- Alert, Oriented, Affect-appropriate, Distress- none acute. + looking very well Skin-  no rash, lesions- none, excoriation- none Lymphadenopathy- none Head- atraumatic            Eyes- Gross vision intact, PERRLA, conjunctivae clear secretions            Ears- Hearing, canals normal            Nose- Clear, No-Septal dev, mucus, polyps, erosion, perforation             Throat- Mallampati II , mucosa-clear , drainage- none, tonsils present.                     Neck- flexible , trachea midline, no stridor , thyroid nl, carotid no bruit Chest - symmetrical excursion , unlabored           Heart/CV- RRR , no murmur , no gallop  , no rub, nl s1 s2                           -  JVD- none , edema- none, stasis changes- none, varices- none           Lung-   Cough-none, chest clear, unlabored, wheeze- none,  dullness-none, rub- none           Chest wall-  Abd-  Br/ Gen/ Rectal- Not done, not indicated Extrem- cyanosis- none, clubbing, none, atrophy- none, strength- nl Neuro- grossly intact to observation

## 2020-02-13 NOTE — Assessment & Plan Note (Signed)
Clinically in remission . Unlikely to reactivate systemically. Plan- CXR

## 2020-02-14 ENCOUNTER — Telehealth: Payer: Self-pay | Admitting: Internal Medicine

## 2020-02-14 NOTE — Telephone Encounter (Signed)
Nancy Budge, MD  02/14/2020 4:17 PM EDT     CXR- there is old scarring from previous sarcoid, but it looks stable.   Called and spoke with pt letting her know the results of the cxr and she verbalized understanding. Nothing further needed.

## 2020-03-26 ENCOUNTER — Other Ambulatory Visit: Payer: Self-pay | Admitting: Family

## 2020-03-26 DIAGNOSIS — Z1231 Encounter for screening mammogram for malignant neoplasm of breast: Secondary | ICD-10-CM

## 2020-03-31 ENCOUNTER — Other Ambulatory Visit: Payer: Self-pay

## 2020-03-31 ENCOUNTER — Ambulatory Visit
Admission: RE | Admit: 2020-03-31 | Discharge: 2020-03-31 | Disposition: A | Discharge status: Home / Self Care | Payer: BC Managed Care – PPO | Source: Ambulatory Visit | Attending: Family | Admitting: Family

## 2020-03-31 DIAGNOSIS — Z1231 Encounter for screening mammogram for malignant neoplasm of breast: Secondary | ICD-10-CM

## 2020-04-03 ENCOUNTER — Ambulatory Visit (INDEPENDENT_AMBULATORY_CARE_PROVIDER_SITE_OTHER): Payer: BC Managed Care – PPO | Admitting: Family

## 2020-04-03 ENCOUNTER — Other Ambulatory Visit: Payer: Self-pay

## 2020-04-03 ENCOUNTER — Encounter: Payer: Self-pay | Admitting: Family

## 2020-04-03 VITALS — BP 102/86 | HR 77 | Temp 98.5°F | Ht 62.0 in | Wt 168.0 lb

## 2020-04-03 DIAGNOSIS — Z13220 Encounter for screening for lipoid disorders: Secondary | ICD-10-CM

## 2020-04-03 DIAGNOSIS — Z0000 Encounter for general adult medical examination without abnormal findings: Secondary | ICD-10-CM | POA: Diagnosis not present

## 2020-04-03 DIAGNOSIS — Z1211 Encounter for screening for malignant neoplasm of colon: Secondary | ICD-10-CM | POA: Diagnosis not present

## 2020-04-03 NOTE — Progress Notes (Signed)
Nancy Villarreal is a 49 y.o. female with the following history as recorded in EpicCare:  Patient Active Problem List   Diagnosis Date Noted  . Upper respiratory infection, acute 06/10/2017  . Bilateral hand pain 01/13/2017  . Pain, joint, shoulder, left 01/13/2017  . Pain in left ankle and joints of left foot 01/13/2017  . High risk medication use 01/13/2017  . Nausea 08/09/2016  . Cough 03/09/2016  . Obesity (BMI 30-39.9) 01/22/2016  . Right foot pain 01/21/2016  . Routine general medical examination at a health care facility 12/11/2013  . Visit for screening mammogram 03/07/2012  . Anemia, iron deficiency 02/10/2012  . Sarcoidosis ([place]) 09/17/2008  . Asthma in remission 09/03/2008  . Allergic rhinitis due to pollen 09/03/2008    Current Outpatient Medications  Medication Sig Dispense Refill  . albuterol (VENTOLIN HFA) 108 (90 Base) MCG/ACT inhaler TAKE 2 PUFFS BY MOUTH EVERY 6 HOURS AS NEEDED FOR WHEEZE OR SHORTNESS OF BREATH 6.7 g 1  . ferrous fumarate-iron polysaccharide complex (TANDEM) 162-115.2 MG CAPS capsule Take 1 capsule by mouth daily with breakfast. 30 capsule 11  . Multiple Vitamins-Minerals (MULTIVITAMIN WITH MINERALS) tablet Take 1 tablet by mouth daily. 30 tablet 11   No current facility-administered medications for this visit.    Allergies: Hydrocodone  Past Medical History:  Diagnosis Date  . Abnormal chest x-ray   . Anemia   . Hemoptysis   . Sarcoid    bronch negative 09-2008 ACE 93  . Unspecified asthma(493.90)     Past Surgical History:  Procedure Laterality Date  . BRONCHOSCOPY  09-2008   Negative  . CESAREAN SECTION    . TUBAL LIGATION     1995    Family History  Problem Relation Age of Onset  . Asthma Paternal Grandfather   . Lung cancer Maternal Grandfather   . Hyperlipidemia Mother   . Hypertension Mother   . Cancer Mother        lung   . Diabetes Father   . Hyperlipidemia Maternal Grandmother   . Stroke Maternal Grandmother   . Cancer  Paternal Grandmother   . Alcohol abuse Paternal Grandmother   . Early death Paternal Grandmother   . Healthy Son   . Healthy Daughter     Social History   Tobacco Use  . Smoking status: Former Smoker    Packs/day: 0.25    Years: 10.00    Pack years: 2.50    Types: Cigarettes    Quit date: 08/05/2008    Years since quitting: 11.6  . Smokeless tobacco: Never Used  Substance Use Topics  . Alcohol use: No    Subjective:  Presents for yearly CPE today; in baseline state of health;  Pap smear was done in 2019- repeat in 2022; Due for baseline colonoscopy; seeing pulmonology for management of sarcoidosis.  Up to date on dental and exams;  Recently lost her mother- coping okay/ planning to start grief counseling;  Review of Systems  Constitutional: Negative.   HENT: Negative.   Eyes: Negative.   Respiratory: Negative.   Cardiovascular: Negative.   Gastrointestinal: Negative.   Genitourinary: Negative.   Musculoskeletal: Negative.   Skin: Negative.   Neurological: Negative.   Endo/Heme/Allergies: Negative.   Psychiatric/Behavioral: Negative.       Objective:  Vitals:   04/03/20 1342  BP: (!) 102/86  Pulse: 77  Temp: 98.5 F (36.9 C)  TempSrc: Oral  SpO2: 97%  Weight: 168 lb (76.2 kg)  Height: _0  (1.575  m)    General: Well developed, well nourished, in no acute distress  Skin : Warm and dry.  Head: Normocephalic and atraumatic  Eyes: Sclera and conjunctiva clear; pupils round and reactive to light; extraocular movements intact  Ears: External normal; canals clear; tympanic membranes normal  Oropharynx: Pink, supple. No suspicious lesions  Neck: Supple without thyromegaly, adenopathy  Lungs: Respirations unlabored; clear to auscultation bilaterally without wheeze, rales, rhonchi  CVS exam: normal rate and regular rhythm.  Abdomen: Soft; nontender; nondistended; normoactive bowel sounds; no masses or hepatosplenomegaly  Musculoskeletal: No deformities; no  active joint inflammation  Extremities: No edema, cyanosis, clubbing  Vessels: Symmetric bilaterally  Neurologic: Alert and oriented; speech intact; face symmetrical; moves all extremities well; CNII-XII intact without focal deficit   Assessment:  1. PE (physical exam), annual   2. Lipid screening     Plan:  Age appropriate preventive healthcare needs addressed; encouraged regular eye doctor and dental exams; encouraged regular exercise; will update labs and refills as needed today; follow-up to be determined; Cologuard is ordered as requested;  This visit occurred during the SARS-CoV-2 public health emergency.  Safety protocols were in place, including screening questions prior to the visit, additional usage of staff PPE, and extensive cleaning of exam room while observing appropriate contact time as indicated for disinfecting solutions.      No follow-ups on file.  Orders Placed This Encounter  Procedures  . CBC with Differential/Platelet    Standing Status:   Future    Standing Expiration Date:   04/03/2021  . Comp Met (CMET)    Standing Status:   Future    Standing Expiration Date:   04/03/2021  . Lipid panel    Standing Status:   Future    Standing Expiration Date:   04/03/2021  . TSH    Standing Status:   Future    Standing Expiration Date:   04/03/2021    Requested Prescriptions    No prescriptions requested or ordered in this encounter

## 2020-04-03 NOTE — Addendum Note (Signed)
Addended by: Simmie Davies on: 04/03/2020 02:09 PM   Modules accepted: Orders

## 2020-04-04 LAB — COMPREHENSIVE METABOLIC PANEL
AG Ratio: 0.9 (calc) — ABNORMAL LOW (ref 1.0–2.5)
ALT: 10 U/L (ref 6–29)
AST: 18 U/L (ref 10–35)
Albumin: 4 g/dL (ref 3.6–5.1)
Alkaline phosphatase (APISO): 45 U/L (ref 37–153)
BUN: 14 mg/dL (ref 7–25)
CO2: 27 mmol/L (ref 20–32)
Calcium: 9.2 mg/dL (ref 8.6–10.4)
Chloride: 100 mmol/L (ref 98–110)
Creat: 0.8 mg/dL (ref 0.50–1.05)
Globulin: 4.5 g/dL (calc) — ABNORMAL HIGH (ref 1.9–3.7)
Glucose, Bld: 103 mg/dL — ABNORMAL HIGH (ref 65–99)
Potassium: 4 mmol/L (ref 3.5–5.3)
Sodium: 134 mmol/L — ABNORMAL LOW (ref 135–146)
Total Bilirubin: 0.3 mg/dL (ref 0.2–1.2)
Total Protein: 8.5 g/dL — ABNORMAL HIGH (ref 6.1–8.1)

## 2020-04-04 LAB — LIPID PANEL
Cholesterol: 212 mg/dL — ABNORMAL HIGH (ref <200)
HDL: 90 mg/dL (ref >=50)
LDL Cholesterol (Calc): 105 mg/dL (calc) — ABNORMAL HIGH
Non-HDL Cholesterol (Calc): 122 mg/dL (calc) (ref <130)
Total CHOL/HDL Ratio: 2.4 (calc) (ref <5.0)
Triglycerides: 79 mg/dL (ref <150)

## 2020-04-04 LAB — TSH: TSH: 1.14 mIU/L

## 2020-04-04 LAB — CBC WITH DIFFERENTIAL/PLATELET
Absolute Monocytes: 441 cells/uL (ref 200–950)
Basophils Absolute: 8 cells/uL (ref 0–200)
Basophils Relative: 0.2 %
Eosinophils Absolute: 109 cells/uL (ref 15–500)
Eosinophils Relative: 2.6 %
HCT: 36.3 % (ref 35.0–45.0)
Hemoglobin: 11.3 g/dL — ABNORMAL LOW (ref 11.7–15.5)
Lymphs Abs: 1457 cells/uL (ref 850–3900)
MCH: 26.6 pg — ABNORMAL LOW (ref 27.0–33.0)
MCHC: 31.1 g/dL — ABNORMAL LOW (ref 32.0–36.0)
MCV: 85.4 fL (ref 80.0–100.0)
MPV: 12 fL (ref 7.5–12.5)
Monocytes Relative: 10.5 %
Neutro Abs: 2184 cells/uL (ref 1500–7800)
Neutrophils Relative %: 52 %
Platelets: 162 10*3/uL (ref 140–400)
RBC: 4.25 10*6/uL (ref 3.80–5.10)
RDW: 13 % (ref 11.0–15.0)
Total Lymphocyte: 34.7 %
WBC: 4.2 10*3/uL (ref 3.8–10.8)

## 2020-05-25 ENCOUNTER — Encounter: Payer: Self-pay | Admitting: Family

## 2020-06-17 ENCOUNTER — Telehealth: Payer: Self-pay

## 2020-06-17 NOTE — Telephone Encounter (Signed)
Attempting to contact patient in regards to Cologuard sample not completed despite several attempts from [facility]

## 2020-07-07 NOTE — Telephone Encounter (Signed)
Will also send mychart communication since pt does not have working phone number.  Encounter closed.

## 2020-07-31 ENCOUNTER — Telehealth: Payer: Self-pay | Admitting: Internal Medicine

## 2020-07-31 NOTE — Telephone Encounter (Signed)
I spoke with the pt and notified of response per [facility]  She verbalized understanding  Nothing further needed

## 2020-07-31 NOTE — Telephone Encounter (Signed)
Spoke with the pt  She states woke up at 2 am this morning and coughed up approx 3 tablespoons of right red blood  She states since then she has had some dull chest discomfort  She has not had any SOB, wheezing, f/c/s or other co's  She states she had similar episode of hemoptysis a few years ago  She is not taking any blood thinners  Please advise, thanks!

## 2020-07-31 NOTE — Telephone Encounter (Signed)
LMTCB and will need one more call today

## 2020-07-31 NOTE — Telephone Encounter (Signed)
She needs to present to ED. Concern for pneumonia or PE.

## 2020-08-03 NOTE — Progress Notes (Signed)
Patient ID: Nancy Villarreal. Nancy Villarreal, female    DOB: 1971/02/04, 49 y.o.   MRN: [id number]  HPI F former smoker followed here with sarcoid Stage III,  Bronch NEG 09/24/08- dx based on mediastinal adenopathy/ interstitial disease,chronic elev ACE , response to steroid. Complicated by Iron def anemia, plasma cell disorder. Bronch NEG 09/24/08- dx based on mediastinal adenopathy/ interstitial disease, ACE 90, response to steroids. PFT 10/11/16- ACE 09/30/16- 86H  ACE 12/07/17- 97, up from 89 in 2018 ACE 01/08/19- 67 WNL Immunoglobulins 12/19/16- IgG 2,669 ---------------------------------------------------------------------------------------------------------   02/13/20- 49 yoF former smoker followed here with Sarcoid Stage III,  Bronch NEG 09/24/08- dx based on mediastinal adenopathy/ interstitial disease, chronic elevation of ACE , response to steroid. Complicated by Iron def anemia, plasma cell disorder Albuterol hfa Weight holding at 164 lbs. Denies cough, adenopathy, change in scar or rash.Feels well and denies interval significant health issues.  ACE 01/08/19- 67 WNL CXR 01/08/19- IMPRESSION: Similar-appearing chronic findings compatible with history of sarcoidosis.  08/04/20- 49 yoF former smoker followed here with Sarcoid Stage III,  Bronch NEG 09/24/08- dx based on mediastinal adenopathy/ interstitial disease, chronic elevation of ACE , response to steroid. Complicated by Iron def anemia, plasma cell disorder Albuterol hfa Called to report acute hemoptysis 11/25. Advised to go to ED>> Body weight today-167 lbs Covid vax- 2 Phizer Flu vax- today standard          Has gone back to maiden name. -----Patient states that she was coughing up some blood on Friday but was not the days before. Only happened Friday. Breathing is good states her chest hurts a little, dull nagging pain. exhausted, night sweats Moderate blood on 2-3 napkins, self limited 2 days ago only. Otherwise dry cough started same day. Vague  substernal soreness. Night sweats, possibly menopausal, no fever. All starting around same time. Some Advil, no ASA. Denies other bleeding or changes, nodes, rash, palpitation. Last similar bleed several years ago. Understood from another provider that IgE was high. Seen by Hematology who told her then to see [place]. We can recheck.  CXR 02/14/20-  IMPRESSION: Chronic changes consistent with the given clinical history of sarcoidosis. No acute abnormality noted.  Review of Systems-see HPI   + = positive HEENT:   No headaches,  Difficulty swallowing,  Tooth/dental problems,  Sore throat,                No sneezing, itching, ear ache, nasal congestion, post nasal drip,  CV:  No- chest pain,  No-PND, swelling in lower extremities, anasarca, dizziness, palpitations GI  No heartburn, indigestion, abdominal pain, nausea, vomiting,  Resp:  No acute shortness of breath with exertion .  No excess mucus, no productive cough,                  non-productive cough,  No- recent coughing up of blood.  No change in color of mucus.  No wheezing.  Skin: no rash or lesions. GU: MS: joint pain .  No decreased range of motion.  No back pain. Psych:  No change in mood or affect. No depression or anxiety.  No memory loss.  Objective:   Physical Exam General- Alert, Oriented, Affect-appropriate, Distress- none acute.  Skin-  no rash, lesions- none, excoriation- none Lymphadenopathy- none Head- atraumatic            Eyes- Gross vision intact, PERRLA, conjunctivae clear secretions            Ears- Hearing, canals normal  Nose- Clear, No-Septal dev, mucus, polyps, erosion, perforation             Throat- Mallampati II , mucosa-clear , drainage- none, tonsils present.                     Neck- flexible , trachea midline, no stridor , thyroid nl, carotid no bruit Chest - symmetrical excursion , unlabored           Heart/CV- RRR , no murmur , no gallop  , no rub, nl s1 s2                           - JVD- none  , edema- none, stasis changes- none, varices- none           Lung-   Cough-none, +trace squeaks, unlabored, wheeze- none,  dullness-none, rub- none           Chest wall-  Abd-  Br/ Gen/ Rectal- Not done, not indicated Extrem- cyanosis- none, clubbing, none, atrophy- none, strength- nl Neuro- grossly intact to observation

## 2020-08-04 ENCOUNTER — Other Ambulatory Visit: Payer: Self-pay

## 2020-08-04 ENCOUNTER — Ambulatory Visit (INDEPENDENT_AMBULATORY_CARE_PROVIDER_SITE_OTHER): Payer: BC Managed Care – PPO

## 2020-08-04 ENCOUNTER — Ambulatory Visit (INDEPENDENT_AMBULATORY_CARE_PROVIDER_SITE_OTHER): Payer: BC Managed Care – PPO | Admitting: Internal Medicine

## 2020-08-04 ENCOUNTER — Encounter: Payer: Self-pay | Admitting: Internal Medicine

## 2020-08-04 VITALS — BP 110/70 | HR 86 | Temp 98.4°F | Ht 63.0 in | Wt 167.0 lb

## 2020-08-04 DIAGNOSIS — R042 Hemoptysis: Secondary | ICD-10-CM | POA: Diagnosis not present

## 2020-08-04 DIAGNOSIS — D869 Sarcoidosis, unspecified: Secondary | ICD-10-CM

## 2020-08-04 DIAGNOSIS — Z23 Encounter for immunization: Secondary | ICD-10-CM | POA: Diagnosis not present

## 2020-08-04 LAB — CBC WITH DIFFERENTIAL/PLATELET
Basophils Absolute: 0 10*3/uL (ref 0.0–0.1)
Basophils Relative: 1.3 % (ref 0.0–3.0)
Eosinophils Absolute: 0.1 10*3/uL (ref 0.0–0.7)
Eosinophils Relative: 3 % (ref 0.0–5.0)
HCT: 37.1 % (ref 36.0–46.0)
Hemoglobin: 12.1 g/dL (ref 12.0–15.0)
Lymphocytes Relative: 41.1 % (ref 12.0–46.0)
Lymphs Abs: 1.4 10*3/uL (ref 0.7–4.0)
MCHC: 32.6 g/dL (ref 30.0–36.0)
MCV: 84.8 fl (ref 78.0–100.0)
Monocytes Absolute: 0.3 10*3/uL (ref 0.1–1.0)
Monocytes Relative: 8.9 % (ref 3.0–12.0)
Neutro Abs: 1.5 10*3/uL (ref 1.4–7.7)
Neutrophils Relative %: 45.7 % (ref 43.0–77.0)
Platelets: 170 10*3/uL (ref 150.0–400.0)
RBC: 4.38 Mil/uL (ref 3.87–5.11)
RDW: 13.5 % (ref 11.5–15.5)
WBC: 3.4 10*3/uL — ABNORMAL LOW (ref 4.0–10.5)

## 2020-08-04 LAB — BASIC METABOLIC PANEL
BUN: 14 mg/dL (ref 6–23)
CO2: 29 mEq/L (ref 19–32)
Calcium: 8.6 mg/dL (ref 8.4–10.5)
Chloride: 102 mEq/L (ref 96–112)
Creatinine, Ser: 0.67 mg/dL (ref 0.40–1.20)
GFR: 101.7 mL/min (ref >60.00)
Glucose, Bld: 80 mg/dL (ref 70–99)
Potassium: 3.7 mEq/L (ref 3.5–5.1)
Sodium: 135 mEq/L (ref 135–145)

## 2020-08-04 MED ORDER — AZITHROMYCIN 250 MG PO TABS: ORAL_TABLET | ORAL | 0 refills | Status: DC

## 2020-08-04 NOTE — Patient Instructions (Signed)
Order- CXR   Dx Sarcoid, acute hemoptysis  Order- lab- Angiotensin Converting Enzyme assay (ACE), CBC w diff, BMET, IgE      Dx Sarcoid  Script sent for Zpak

## 2020-08-04 NOTE — Assessment & Plan Note (Signed)
Self limited moderate hemoptysis with non-specific symptoms. Most likely related to old sarcoid scarring with tear. Possible early pneumonia. Doubt PE. Plan- empiric Zpak, avoid aspirin. CXR, ACE level, IgE, CBC w diff.

## 2020-08-04 NOTE — Assessment & Plan Note (Signed)
We will reassess status given acute symptoms. She says sweats may be menopausal.  Plan- ACE level, CXR,

## 2020-08-05 LAB — ANGIOTENSIN CONVERTING ENZYME: Angiotensin-Converting Enzyme: 80 U/L — ABNORMAL HIGH (ref 9–67)

## 2020-08-05 LAB — IGE: IgE (Immunoglobulin E), Serum: 35 kU/L (ref <=114)

## 2020-08-05 NOTE — Progress Notes (Signed)
Called and spoke with patient, provided results per Dr. Maple Hudson.  She verbalized understanding.  She did ask about her ACE level, I let her know it looked like it had been drawn, but did not see any results at this time.  Advised her that we would call her when that test results and Dr. Maple Hudson has a chance to review it.  She verbalized understanding.  Nothing further needed.

## 2020-08-06 ENCOUNTER — Telehealth: Payer: Self-pay | Admitting: Internal Medicine

## 2020-08-06 NOTE — Telephone Encounter (Signed)
ATC Patient.  LM to call back for results. 

## 2020-08-06 NOTE — Telephone Encounter (Signed)
Pt returning missed call. 

## 2020-08-06 NOTE — Telephone Encounter (Signed)
Spoke with patient regarding Lab and CXR result. They verbalized understanding. No further questions. Rx for Prednisone sent to preferred pharmacy.   Dr. Maple Hudson can we use your held slot for January 13 at 11:30 for her 6 week follow up?

## 2020-08-06 NOTE — Telephone Encounter (Signed)
Called and spoke with patient to let her know appointment information. She expressed understanding. Nothing further needed at this time.

## 2020-08-06 NOTE — Telephone Encounter (Signed)
Ok to use the jan 13 spot, thanks.

## 2020-08-07 ENCOUNTER — Ambulatory Visit (INDEPENDENT_AMBULATORY_CARE_PROVIDER_SITE_OTHER): Payer: BC Managed Care – PPO | Admitting: Family

## 2020-08-07 ENCOUNTER — Encounter: Payer: Self-pay | Admitting: Family

## 2020-08-07 ENCOUNTER — Other Ambulatory Visit: Payer: Self-pay

## 2020-08-07 VITALS — BP 126/82 | HR 76 | Temp 98.4°F | Ht 63.0 in | Wt 168.0 lb

## 2020-08-07 DIAGNOSIS — F419 Anxiety disorder, unspecified: Secondary | ICD-10-CM

## 2020-08-07 DIAGNOSIS — D869 Sarcoidosis, unspecified: Secondary | ICD-10-CM | POA: Diagnosis not present

## 2020-08-07 DIAGNOSIS — F32A Depression, unspecified: Secondary | ICD-10-CM

## 2020-08-07 DIAGNOSIS — R232 Flushing: Secondary | ICD-10-CM | POA: Diagnosis not present

## 2020-08-07 LAB — FOLLICLE STIMULATING HORMONE: FSH: 23.3 m[IU]/mL

## 2020-08-07 LAB — LUTEINIZING HORMONE: LH: 14.98 m[IU]/mL

## 2020-08-07 MED ORDER — VENLAFAXINE HCL ER 37.5 MG PO CP24
ORAL_CAPSULE | ORAL | 1 refills | Status: DC
Start: 2020-08-07 — End: 2020-10-12

## 2020-08-07 MED ORDER — PREDNISONE 10 MG PO TABS
10.0000 mg | ORAL_TABLET | Freq: Every day | ORAL | 0 refills | Status: DC
Start: 2020-08-07 — End: 2020-09-17

## 2020-08-07 NOTE — Progress Notes (Signed)
Nancy Villarreal is a 49 y.o. female with the following history as recorded in EpicCare:  Patient Active Problem List   Diagnosis Date Noted  . Upper respiratory infection, acute 06/10/2017  . Bilateral hand pain 01/13/2017  . Pain, joint, shoulder, left 01/13/2017  . Pain in left ankle and joints of left foot 01/13/2017  . High risk medication use 01/13/2017  . Nausea 08/09/2016  . Cough 03/09/2016  . Obesity (BMI 30-39.9) 01/22/2016  . Right foot pain 01/21/2016  . Routine general medical examination at a health care facility 12/11/2013  . Visit for screening mammogram 03/07/2012  . Anemia, iron deficiency 02/10/2012  . Sarcoidosis (HCC) 09/17/2008  . Asthma in remission 09/03/2008  . Allergic rhinitis due to pollen 09/03/2008    Current Outpatient Medications  Medication Sig Dispense Refill  . albuterol (VENTOLIN HFA) 108 (90 Base) MCG/ACT inhaler TAKE 2 PUFFS BY MOUTH EVERY 6 HOURS AS NEEDED FOR WHEEZE OR SHORTNESS OF BREATH 6.7 g 1  . azithromycin (ZITHROMAX) 250 MG tablet 2 tabs today then one daily 6 tablet 0  . ferrous fumarate-iron polysaccharide complex (TANDEM) 162-115.2 MG CAPS capsule Take 1 capsule by mouth daily with breakfast. 30 capsule 11  . Multiple Vitamins-Minerals (MULTIVITAMIN WITH MINERALS) tablet Take 1 tablet by mouth daily. 30 tablet 11  . predniSONE (DELTASONE) 10 MG tablet Take 1 tablet (10 mg total) by mouth daily with breakfast. 30 tablet 0  . venlafaxine XR (EFFEXOR XR) 37.5 MG 24 hr capsule Take 1 tablet daily x 1 week; then increase to 2 tablets/ day 60 capsule 1   No current facility-administered medications for this visit.    Allergies: Hydrocodone  Past Medical History:  Diagnosis Date  . Abnormal chest x-ray   . Anemia   . Hemoptysis   . Sarcoid    bronch negative 09-2008 ACE 93  . Unspecified asthma(493.90)     Past Surgical History:  Procedure Laterality Date  . BRONCHOSCOPY  09-2008   Negative  . CESAREAN SECTION    . TUBAL  LIGATION     1995    Family History  Problem Relation Age of Onset  . Asthma Paternal Grandfather   . Lung cancer Maternal Grandfather   . Hyperlipidemia Mother   . Hypertension Mother   . Cancer Mother        lung   . Diabetes Father   . Hyperlipidemia Maternal Grandmother   . Stroke Maternal Grandmother   . Cancer Paternal Grandmother   . Alcohol abuse Paternal Grandmother   . Early death Paternal Grandmother   . Healthy Son   . Healthy Daughter     Social History   Tobacco Use  . Smoking status: Former Smoker    Packs/day: 0.25    Years: 10.00    Pack years: 2.50    Types: Cigarettes    Quit date: 08/05/2008    Years since quitting: 12.0  . Smokeless tobacco: Never Used  Substance Use Topics  . Alcohol use: No    Subjective:  Patient is concerned that she is going through menopause/ also going through extreme stress both personally and professionally; LMP- November 18; notes that she did not have a period in September and experienced night sweats;  Thought the night sweats were related to menopause but has found out within past 24 hours that sarcoid is active again; will be starting prednisone 10 mg daily x 30 days;  Has recently lost her ex-husband, fiance has prostate cancer and now has  custody of 2 yo grandson; just felt like sarcoid re-flare was more than she could handle right now- considering applying for short term disability for at least a month.      Objective:  Vitals:   08/07/20 1331  BP: 126/82  Pulse: 76  Temp: 98.4 F (36.9 C)  TempSrc: Oral  SpO2: 99%  Weight: 168 lb (76.2 kg)  Height: 5\' 3"  (1.6 m)    General: Well developed, well nourished, tearful in office Skin : Warm and dry.  Head: Normocephalic and atraumatic  Eyes: Sclera and conjunctiva clear; pupils round and reactive to light; extraocular movements intact  Ears: External normal; canals clear; tympanic membranes normal  Oropharynx: Pink, supple. No suspicious lesions  Neck: Supple  without thyromegaly, adenopathy  Lungs: Respirations unlabored; Neurologic: Alert and oriented; speech intact; face symmetrical; moves all extremities well; CNII-XII intact without focal deficit   Assessment:  1. Hot flashes   2. Anxiety and depression   3. Sarcoidosis (HCC)     Plan:  1. ? Menopause vs related to re-flare of sarcoid; will check FSH, LH level today; patient does not want to hormone treatment; 2. Will give trial of Effexor XR- explained can be non hormonal option for menopause relief as well as treating anxiety/ depression; risks/ benefits discussed- plan to evaluate response in 1 week; 3. Patient's pulmonologist called in prednisone 10 mg daily x 30 days; prescription not received at her pharmacy and she is having a hard time reaching pulmonology- asking for prescription so she can get started with the upcoming weekend; agree to prescription and ordered as directed by Dr. ; keep planned follow-up with him.  This visit occurred during the SARS-CoV-2 public health emergency.  Safety protocols were in place, including screening questions prior to the visit, additional usage of staff PPE, and extensive cleaning of exam room while observing appropriate contact time as indicated for disinfecting solutions.     Return in about 1 month (around 09/07/2020).  Orders Placed This Encounter  Procedures  . FSH    Standing Status:   Future    Number of Occurrences:   1    Standing Expiration Date:   08/07/2021  . LH    Standing Status:   Future    Number of Occurrences:   1    Standing Expiration Date:   08/07/2021  . Ambulatory referral to Psychology    Referral Priority:   Routine    Referral Type:   Psychiatric    Referral Reason:   Specialty Services Required    Requested Specialty:   Psychology    Number of Visits Requested:   1    Requested Prescriptions   Signed Prescriptions Disp Refills  . venlafaxine XR (EFFEXOR XR) 37.5 MG 24 hr capsule 60 capsule 1    Sig: Take 1  tablet daily x 1 week; then increase to 2 tablets/ day  . predniSONE (DELTASONE) 10 MG tablet 30 tablet 0    Sig: Take 1 tablet (10 mg total) by mouth daily with breakfast.

## 2020-08-10 NOTE — Telephone Encounter (Signed)
Dr. Maple Hudson, please see letter sent by pt which is in the review media tab of the mychart message.

## 2020-08-11 ENCOUNTER — Encounter: Payer: Self-pay | Admitting: Internal Medicine

## 2020-08-11 NOTE — Telephone Encounter (Signed)
I have printed a letter for Ms Nancy Villarreal- in my tray on C-pod

## 2020-08-11 NOTE — Telephone Encounter (Signed)
Dr. Maple Hudson, pt stated that a letter is needed to initiate the request and then [facility] will send the documentation to our office. Please advise what needs to be stated in the letter.

## 2020-08-13 ENCOUNTER — Encounter: Payer: Self-pay | Admitting: Family

## 2020-08-18 NOTE — Telephone Encounter (Signed)
Dr. Maple Hudson, The patient needs to have FMLA paperwork completed.  She was last seen by you in the office on 08/04/2020 and she has an upcoming appointment in January.  I have advised her to drop the paperwork off so we can get it ready for you to complete.  She wants to know if she needs an office visit prior to her January visit in order to get this paperwork completed.  Please advise.  Thank you.

## 2020-08-18 NOTE — Telephone Encounter (Signed)
She can keep the January appointment. She just needs to be clear what dates she wants leave to start and stop.

## 2020-08-19 NOTE — Telephone Encounter (Signed)
Thank you, I will keep that appointment.  Please fax using the fax sheet provided. Thank you so much for all of your help.   Mandi and [place],   Patient is waiting for FMLA paperwork to be faxed.  Patient stated in message fax sheet was provided in paperwork she dropped off at office for Dr. Maple Hudson.

## 2020-08-19 NOTE — Telephone Encounter (Signed)
Dr. Maple Hudson, The patient has put the specific dates in her last message for the [facility] paperwork.  Thank you.

## 2020-08-21 NOTE — Telephone Encounter (Signed)
Forms given to Dr. Maple Hudson to completed - received completed forms back from [facility]. Sent email to Marisue Ivan to drop charge for form fee. -pr

## 2020-08-25 NOTE — Telephone Encounter (Signed)
Patient sent email inquiring about paperwork  Good morning, I have been receiving emails that the paperwork has not been received. Following up to see if paperwork has been faxed to [employer] and to the [place].   Patrice please advise if you have an update

## 2020-09-07 ENCOUNTER — Ambulatory Visit (INDEPENDENT_AMBULATORY_CARE_PROVIDER_SITE_OTHER): Payer: BC Managed Care – PPO | Admitting: Psychology

## 2020-09-07 DIAGNOSIS — F321 Major depressive disorder, single episode, moderate: Secondary | ICD-10-CM

## 2020-09-07 DIAGNOSIS — D869 Sarcoidosis, unspecified: Secondary | ICD-10-CM

## 2020-09-07 NOTE — Telephone Encounter (Signed)
Please advise on patient mychart message  [employer] has left several messages about requested information. They are scheduled to make a determination on my case on the 6th. They continue to claim you have not responded to fax or messages. Please help me with this documentation, I need it to receive payment.  Thank you

## 2020-09-09 NOTE — Telephone Encounter (Signed)
Please advise  [address] [address] [place], [place] [address] Phone: [phone] Fax: [phone] September 08, 2020 [facility] [place] [address] [place] - [address] Plan Sponsor:[employer] Claimant:Nancy Villarreal Field Insured MC:[id number] Plan Number:WD NST 1 Class 2/ASO-STD RE: Short-Term Disability  Dear Nancy Villarreal closed your Short-Term Disability (STD) claim [phone], as we did not receive the information needed to make a decision. We may be able to re-open your claim, if you submit all the missing information. What We Need From You We need medical information from your treating provider(s) to make a decision. We requested records from them but we have not received a response. Without this information, we are unable to make a decision. Here's what's missing: Medical Documentation from your Provider Office Visit Notes Attending Physician Statement How to Re-open Your Claim If you want [place] to reopen your claim, please be sure we receive the requested information within thirty (30) days from the date of this letter. We'll review any additional information and notify you of the results of our review. You can send the informa?on by fax [phone], online [email or web address], or by mail to: [employer] [address] [address] [place], [place], [address] If you have ques?ons or need help, you can call us at [phone] extension [phone], between 8:00 AM and 8:00 PM ET, Monday through Friday  Thank you, [employer]

## 2020-09-12 ENCOUNTER — Telehealth: Payer: Self-pay | Admitting: Internal Medicine

## 2020-09-16 NOTE — Progress Notes (Signed)
Patient ID: Nancy Villarreal. [name], female    DOB: 04-14-1971, 50 y.o.   MRN: [id number]  HPI F former smoker followed here with sarcoid Stage III,  Bronch NEG 09/24/08- dx based on mediastinal adenopathy/ interstitial disease,chronic elev ACE , response to steroid. Complicated by Iron def anemia, plasma cell disorder. Bronch NEG 09/24/08- dx based on mediastinal adenopathy/ interstitial disease, ACE 90, response to steroids. PFT 10/11/16- ACE 09/30/16- 86H  ACE 12/07/17- 97, up from 89 in 2018 ACE 01/08/19- 67 WNL Immunoglobulins 12/19/16- IgG 2,669 ---------------------------------------------------------------------------------------------------------   08/04/20- 49 yoF former smoker followed here with Sarcoid Stage III,  Bronch NEG 09/24/08- dx based on mediastinal adenopathy/ interstitial disease, chronic elevation of ACE , response to steroid. Complicated by Iron def anemia, plasma cell disorder Albuterol hfa Called to report acute hemoptysis 11/25. Advised to go to ED>> Body weight today-167 lbs Covid vax- 2 Phizer Flu vax- today standard          Has gone back to maiden name. -----Patient states that she was coughing up some blood on Friday but was not the days before. Only happened Friday. Breathing is good states her chest hurts a little, dull nagging pain. exhausted, night sweats Moderate blood on 2-3 napkins, self limited 2 days ago only. Otherwise dry cough started same day. Vague substernal soreness. Night sweats, possibly menopausal, no fever. All starting around same time. Some Advil, no ASA. Denies other bleeding or changes, nodes, rash, palpitation. Last similar bleed several years ago. Understood from another provider that IgE was high. Seen by Hematology who told her then to see [place]. We can recheck.  CXR 02/14/20-  IMPRESSION: Chronic changes consistent with the given clinical history of sarcoidosis. No acute abnormality noted.  09/17/20- 49 yoF former smoker followed here with Sarcoid  Stage III,  Bronch NEG 09/24/08- dx based on mediastinal adenopathy/ interstitial disease, chronic elevation of ACE , response to steroid. Hemoptysis, Complicated by Iron def anemia, plasma cell disorder Albuterol hfa Short term Disability  Covid vax- 3 Phizer Flu vax- had Lab- ACE 80 H- 08/04/20 Took prednisone 10 daily 12/3- 09/17/20 LOA ends with RTW Feb 1. She feels ok now, "nothing physical, just fatigue". No longer coughing. Brings RTW certification form for me.  Has noted new rash along collar line on chest. CXR 08/04/20- IMPRESSION: 1. 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  Review of Systems-see HPI   + = positive HEENT:   No headaches,  Difficulty swallowing,  Tooth/dental problems,  Sore throat,                No sneezing, itching, ear ache, nasal congestion, post nasal drip,  CV:  No- chest pain,  No-PND, swelling in lower extremities, anasarca, dizziness, palpitations GI  No heartburn, indigestion, abdominal pain, nausea, vomiting,  Resp:  No acute shortness of breath with exertion .  No excess mucus, no productive cough,                  non-productive cough,  No- recent coughing up of blood.  No change in color of mucus.  No wheezing.  Skin: no rash or lesions. GU: MS: joint pain .  No decreased range of motion.  No back pain. Psych:  No change in mood or affect. No depression or anxiety.  No memory loss.  Objective:   Physical Exam General- Alert, Oriented, Affect-appropriate, Distress- none acute.  Skin-   +Light band of acne type rash along collar line, anterior  chest. Lymphadenopathy- none Head- atraumatic            Eyes- Gross vision intact, PERRLA, conjunctivae clear secretions            Ears- Hearing, canals normal            Nose- Clear, No-Septal dev, mucus, polyps, erosion, perforation             Throat- Mallampati II , mucosa-clear , drainage- none, tonsils present.                     Neck-  flexible , trachea midline, no stridor , thyroid nl, carotid no bruit Chest - symmetrical excursion , unlabored           Heart/CV- RRR , no murmur , no gallop  , no rub, nl s1 s2                           - JVD- none , edema- none, stasis changes- none, varices- none           Lung-   Cough-none, +trace squeaks, unlabored, wheeze- none,  dullness-none, rub- none           Chest wall-  Abd-  Br/ Gen/ Rectal- Not done, not indicated Extrem- cyanosis- none, clubbing, none, atrophy- none, strength- nl Neuro- grossly intact to observation

## 2020-09-16 NOTE — Telephone Encounter (Signed)
Rec'd signed forms back - Faxed to the [place] -pr

## 2020-09-17 ENCOUNTER — Ambulatory Visit (INDEPENDENT_AMBULATORY_CARE_PROVIDER_SITE_OTHER): Payer: BC Managed Care – PPO | Admitting: Internal Medicine

## 2020-09-17 ENCOUNTER — Other Ambulatory Visit: Payer: Self-pay

## 2020-09-17 ENCOUNTER — Encounter: Payer: Self-pay | Admitting: Internal Medicine

## 2020-09-17 ENCOUNTER — Ambulatory Visit (INDEPENDENT_AMBULATORY_CARE_PROVIDER_SITE_OTHER): Payer: BC Managed Care – PPO

## 2020-09-17 VITALS — BP 114/66 | HR 101 | Temp 97.2°F | Ht 63.0 in | Wt 172.6 lb

## 2020-09-17 DIAGNOSIS — D869 Sarcoidosis, unspecified: Secondary | ICD-10-CM

## 2020-09-17 DIAGNOSIS — R21 Rash and other nonspecific skin eruption: Secondary | ICD-10-CM | POA: Diagnosis not present

## 2020-09-17 NOTE — Patient Instructions (Addendum)
Ok to stay off of prednisone now  Order- CXR  Dx Sarcoid  We do anticipate return to work on Feb 1. We can wait a bit off prednisone to make sure you are stable before filling your form out.  Pleas call if we can help

## 2020-09-18 DIAGNOSIS — R21 Rash and other nonspecific skin eruption: Secondary | ICD-10-CM | POA: Insufficient documentation

## 2020-09-18 NOTE — Assessment & Plan Note (Signed)
Acne and follicular plugging along collar line, almost certainly related to recent prednisone burst. Expect it to clear now that she if off steroids. Plan- observation.

## 2020-09-18 NOTE — Assessment & Plan Note (Signed)
Recent acute bronchitis, complicated by family/ social stress. I can't tell that she had sarcoid flare per se. ACE is acute phase reactant. She feels better now, but just ended prednisone. No further hemoptysis.  Plan- Update CXR, stay off prednisone and observe. Ok return to work Feb 1.

## 2020-09-21 ENCOUNTER — Ambulatory Visit (INDEPENDENT_AMBULATORY_CARE_PROVIDER_SITE_OTHER): Payer: BC Managed Care – PPO | Admitting: Psychology

## 2020-09-21 DIAGNOSIS — F321 Major depressive disorder, single episode, moderate: Secondary | ICD-10-CM

## 2020-09-23 ENCOUNTER — Encounter: Payer: Self-pay | Admitting: *Deleted

## 2020-09-28 ENCOUNTER — Ambulatory Visit: Payer: BC Managed Care – PPO | Admitting: Psychology

## 2020-09-29 NOTE — Telephone Encounter (Signed)
Dr. Maple Hudson please advise on patient mychart message  Good Afternoon Dr. Maple Hudson,  Please complete the return to work document. I will be by to receive the document on Thursday if possible.  Thank you again  for all your help.

## 2020-09-30 ENCOUNTER — Telehealth: Payer: Self-pay | Admitting: Internal Medicine

## 2020-09-30 NOTE — Telephone Encounter (Signed)
Rec'd disability form via fax from [facility], prepared forms - will give to Dr. Maple Hudson when he returns to clinic tomorrow 10/01/2020-pr

## 2020-10-03 NOTE — Telephone Encounter (Signed)
Rec'd forms back from [facility] - Faxed to [employer] at [phone]

## 2020-10-05 ENCOUNTER — Ambulatory Visit (INDEPENDENT_AMBULATORY_CARE_PROVIDER_SITE_OTHER): Payer: BC Managed Care – PPO | Admitting: Psychology

## 2020-10-05 DIAGNOSIS — F321 Major depressive disorder, single episode, moderate: Secondary | ICD-10-CM

## 2020-10-06 ENCOUNTER — Encounter: Payer: Self-pay | Admitting: Family

## 2020-10-12 ENCOUNTER — Other Ambulatory Visit: Payer: Self-pay | Admitting: Family

## 2020-10-12 ENCOUNTER — Encounter: Payer: Self-pay | Admitting: Family

## 2020-10-12 ENCOUNTER — Ambulatory Visit (INDEPENDENT_AMBULATORY_CARE_PROVIDER_SITE_OTHER): Payer: BC Managed Care – PPO | Admitting: Family

## 2020-10-12 ENCOUNTER — Other Ambulatory Visit: Payer: Self-pay

## 2020-10-12 VITALS — BP 120/80 | HR 86 | Temp 98.7°F | Ht 63.0 in | Wt 175.8 lb

## 2020-10-12 DIAGNOSIS — D509 Iron deficiency anemia, unspecified: Secondary | ICD-10-CM

## 2020-10-12 DIAGNOSIS — M791 Myalgia, unspecified site: Secondary | ICD-10-CM

## 2020-10-12 DIAGNOSIS — D869 Sarcoidosis, unspecified: Secondary | ICD-10-CM | POA: Diagnosis not present

## 2020-10-12 LAB — CBC WITH DIFFERENTIAL/PLATELET
Basophils Absolute: 0 10*3/uL (ref 0.0–0.1)
Basophils Relative: 0.9 % (ref 0.0–3.0)
Eosinophils Absolute: 0.1 10*3/uL (ref 0.0–0.7)
Eosinophils Relative: 1.4 % (ref 0.0–5.0)
HCT: 35.4 % — ABNORMAL LOW (ref 36.0–46.0)
Hemoglobin: 11.4 g/dL — ABNORMAL LOW (ref 12.0–15.0)
Lymphocytes Relative: 47.3 % — ABNORMAL HIGH (ref 12.0–46.0)
Lymphs Abs: 1.7 10*3/uL (ref 0.7–4.0)
MCHC: 32.2 g/dL (ref 30.0–36.0)
MCV: 84.1 fl (ref 78.0–100.0)
Monocytes Absolute: 0.4 10*3/uL (ref 0.1–1.0)
Monocytes Relative: 12.6 % — ABNORMAL HIGH (ref 3.0–12.0)
Neutro Abs: 1.3 10*3/uL — ABNORMAL LOW (ref 1.4–7.7)
Neutrophils Relative %: 37.8 % — ABNORMAL LOW (ref 43.0–77.0)
Platelets: 170 10*3/uL (ref 150.0–400.0)
RBC: 4.21 Mil/uL (ref 3.87–5.11)
RDW: 13.9 % (ref 11.5–15.5)
WBC: 3.5 10*3/uL — ABNORMAL LOW (ref 4.0–10.5)

## 2020-10-12 LAB — SEDIMENTATION RATE: Sed Rate: 48 mm/hr — ABNORMAL HIGH (ref 0–30)

## 2020-10-12 LAB — FERRITIN: Ferritin: 12.6 ng/mL (ref 10.0–291.0)

## 2020-10-12 MED ORDER — VENLAFAXINE HCL ER 75 MG PO CP24: ORAL_CAPSULE | ORAL | 1 refills | Status: DC

## 2020-10-12 NOTE — Progress Notes (Signed)
Nancy Villarreal is a 50 y.o. female with the following history as recorded in EpicCare:  Patient Active Problem List   Diagnosis Date Noted  . Rash and nonspecific skin eruption 09/18/2020  . Upper respiratory infection, acute 06/10/2017  . Bilateral hand pain 01/13/2017  . Pain, joint, shoulder, left 01/13/2017  . Pain in left ankle and joints of left foot 01/13/2017  . High risk medication use 01/13/2017  . Nausea 08/09/2016  . Cough 03/09/2016  . Obesity (BMI 30-39.9) 01/22/2016  . Right foot pain 01/21/2016  . Routine general medical examination at a health care facility 12/11/2013  . Visit for screening mammogram 03/07/2012  . Anemia, iron deficiency 02/10/2012  . Sarcoidosis (HCC) 09/17/2008  . Asthma in remission 09/03/2008  . Allergic rhinitis due to pollen 09/03/2008    Current Outpatient Medications  Medication Sig Dispense Refill  . albuterol (VENTOLIN HFA) 108 (90 Base) MCG/ACT inhaler TAKE 2 PUFFS BY MOUTH EVERY 6 HOURS AS NEEDED FOR WHEEZE OR SHORTNESS OF BREATH 6.7 g 1  . Multiple Vitamins-Minerals (MULTIVITAMIN WITH MINERALS) tablet Take 1 tablet by mouth daily. 30 tablet 11  . ferrous fumarate-iron polysaccharide complex (TANDEM) 162-115.2 MG CAPS capsule Take 1 capsule by mouth daily with breakfast. (Patient not taking: Reported on 10/12/2020) 30 capsule 11  . venlafaxine XR (EFFEXOR XR) 75 MG 24 hr capsule Take 1 tablet daily x 1 week; 90 capsule 1   No current facility-administered medications for this visit.    Allergies: Hydrocodone  Past Medical History:  Diagnosis Date  . Abnormal chest x-ray   . Anemia   . Hemoptysis   . Sarcoid    bronch negative 09-2008 ACE 93  . Unspecified asthma(493.90)     Past Surgical History:  Procedure Laterality Date  . BRONCHOSCOPY  09-2008   Negative  . CESAREAN SECTION    . TUBAL LIGATION     1995    Family History  Problem Relation Age of Onset  . Asthma Paternal Grandfather   . Lung cancer Maternal Grandfather    . Hyperlipidemia Mother   . Hypertension Mother   . Cancer Mother        lung   . Diabetes Father   . Hyperlipidemia Maternal Grandmother   . Stroke Maternal Grandmother   . Cancer Paternal Grandmother   . Alcohol abuse Paternal Grandmother   . Early death Paternal Grandmother   . Healthy Son   . Healthy Daughter     Social History   Tobacco Use  . Smoking status: Former Smoker    Packs/day: 0.25    Years: 10.00    Pack years: 2.50    Types: Cigarettes    Quit date: 08/05/2008    Years since quitting: 12.1  . Smokeless tobacco: Never Used  Substance Use Topics  . Alcohol use: No    Subjective:   1 month follow-up on start of Effexor XR; pleased with response.  Having increased problems with her joints/ sarcoidosis- pulmonologist has said her lungs are stable; having increased fatigue;   Objective:  Vitals:   10/12/20 1332  BP: 120/80  Pulse: 86  Temp: 98.7 F (37.1 C)  TempSrc: Oral  SpO2: 98%  Weight: 175 lb 12.8 oz (79.7 kg)  Height: 5\' 3"  (1.6 m)    General: Well developed, well nourished, in no acute distress  Skin : Warm and dry.  Head: Normocephalic and atraumatic  Lungs: Respirations unlabored; Neurologic: Alert and oriented; speech intact; face symmetrical; moves all extremities  well; CNII-XII intact without focal deficit  Assessment:  1. Myalgia   2. Sarcoidosis (HCC)   3. Iron deficiency anemia, unspecified iron deficiency anemia type     Plan:  Refer back to rheumatology- she is technically Dr. Fatima Sanger patient and will call to see if she can get an appointment sooner; will most likely do prednisone burst to help in the short term; will update labs today and follow-up to be determined;  Good response to Effexor XR- refill updated;  Time spent 30 minutes  No follow-ups on file.  Orders Placed This Encounter  Procedures  . Antinuclear Antib (ANA)    Standing Status:   Future    Number of Occurrences:   1    Standing Expiration Date:    10/12/2021  . Sedimentation rate    Standing Status:   Future    Number of Occurrences:   1    Standing Expiration Date:   10/12/2021  . Rheumatoid Factor    Standing Status:   Future    Number of Occurrences:   1    Standing Expiration Date:   10/12/2021  . CBC with Differential/Platelet    Standing Status:   Future    Number of Occurrences:   1    Standing Expiration Date:   10/12/2021  . Ferritin    Standing Status:   Future    Number of Occurrences:   1    Standing Expiration Date:   10/12/2021  . Iron and TIBC    Standing Status:   Future    Number of Occurrences:   1    Standing Expiration Date:   10/12/2021  . Angiotensin converting enzyme    Standing Status:   Future    Number of Occurrences:   1    Standing Expiration Date:   10/12/2021  . Ambulatory referral to Rheumatology    Referral Priority:   Routine    Referral Type:   Consultation    Referral Reason:   Specialty Services Required    Referred to Provider:   Pollyann Savoy, MD    Requested Specialty:   Rheumatology    Number of Visits Requested:   1    Requested Prescriptions   Signed Prescriptions Disp Refills  . venlafaxine XR (EFFEXOR XR) 75 MG 24 hr capsule 90 capsule 1    Sig: Take 1 tablet daily x 1 week;

## 2020-10-13 ENCOUNTER — Encounter: Payer: Self-pay | Admitting: Family

## 2020-10-13 LAB — IRON AND TIBC
Iron Saturation: 21 % (ref 15–55)
Iron: 73 ug/dL (ref 27–159)
Total Iron Binding Capacity: 342 ug/dL (ref 250–450)
UIBC: 269 ug/dL (ref 131–425)

## 2020-10-13 NOTE — Progress Notes (Signed)
Office Visit Note  Patient: Nancy Villarreal             Date of Birth: 01/10/1971           MRN: [id number]             PCP: Olive Bass, FNP Referring: Olive Bass,* Visit Date: 10/15/2020 Occupation: @GUAROCC @  Subjective:  Pain in multiple joints.   History of Present Illness: Nancy Villarreal is a 50 y.o. female history of pulmonary sarcoidosis and arthritis.  She returns today after her last visit of March 14, 2018.  She discontinued methotrexate in December 2018.  She states in November 2021 she started having shortness of breath.  She was evaluated by Dr. December 2021 in December.  She was diagnosed with pulmonary sarcoidosis flare and was given prednisone aper for about 30 days.  She states she had to come out of work due to pulmonary symptoms.  She has been off prednisone for almost a month now.  She states about 1 week ago she started having increased joint pain and difficulty walking.  She also has been experiencing shortness of breath.  She was seen by her PCP who took her out of work again.  She describes pain and discomfort in her bilateral shoulders, bilateral hands, bilateral knee joints and her feet.  Notices swelling in her hands.  She is also experiencing shortness of breath.  She describes muscle pain as well.  Activities of Daily Living:  Patient reports morning stiffness for 24 hours.   Patient Reports nocturnal pain.  Difficulty dressing/grooming: Reports Difficulty climbing stairs: Reports Difficulty getting out of chair: Reports Difficulty using hands for taps, buttons, cutlery, and/or writing: Reports  Review of Systems  Constitutional: Positive for fatigue. Negative for night sweats, weight gain and weight loss.  HENT: Positive for mouth dryness. Negative for mouth sores, trouble swallowing, trouble swallowing and nose dryness.   Eyes: Negative for pain, redness, itching, visual disturbance and dryness.  Respiratory: Positive for cough and  shortness of breath. Negative for hemoptysis and difficulty breathing.   Cardiovascular: Positive for palpitations. Negative for chest pain, hypertension, irregular heartbeat and swelling in legs/feet.  Gastrointestinal: Positive for constipation. Negative for abdominal pain, blood in stool and diarrhea.  Endocrine: Negative for increased urination.  Genitourinary: Negative for painful urination and vaginal dryness.  Musculoskeletal: Positive for arthralgias, joint pain, joint swelling, myalgias, morning stiffness, muscle tenderness and myalgias. Negative for muscle weakness.  Skin: Negative for color change, rash, hair loss, redness, skin tightness, ulcers and sensitivity to sunlight.  Allergic/Immunologic: Positive for susceptible to infections.  Neurological: Positive for headaches. Negative for dizziness, numbness, paresthesias, memory loss and night sweats.  Hematological: Negative for swollen glands.  Psychiatric/Behavioral: Positive for sleep disturbance. Negative for depressed mood and confusion. The patient is not nervous/anxious.     PMFS History:  Patient Active Problem List   Diagnosis Date Noted  . Rash and nonspecific skin eruption 09/18/2020  . Upper respiratory infection, acute 06/10/2017  . Bilateral hand pain 01/13/2017  . Pain, joint, shoulder, left 01/13/2017  . Pain in left ankle and joints of left foot 01/13/2017  . High risk medication use 01/13/2017  . Nausea 08/09/2016  . Cough 03/09/2016  . Obesity (BMI 30-39.9) 01/22/2016  . Right foot pain 01/21/2016  . Routine general medical examination at a health care facility 12/11/2013  . Visit for screening mammogram 03/07/2012  . Anemia, iron deficiency 02/10/2012  . Sarcoidosis (H[facility]) 09/17/2008  . Asthma  in remission 09/03/2008  . Allergic rhinitis due to pollen 09/03/2008    Past Medical History:  Diagnosis Date  . Abnormal chest x-ray   . Anemia   . Hemoptysis   . Sarcoid    bronch negative 09-2008 ACE 93   . Unspecified asthma(493.90)     Family History  Problem Relation Age of Onset  . Asthma Paternal Grandfather   . Lung cancer Maternal Grandfather   . Hyperlipidemia Mother   . Hypertension Mother   . Cancer Mother        lung   . Diabetes Father   . Hyperlipidemia Maternal Grandmother   . Stroke Maternal Grandmother   . Cancer Paternal Grandmother   . Alcohol abuse Paternal Grandmother   . Early death Paternal Grandmother   . Healthy Son   . Healthy Daughter    Past Surgical History:  Procedure Laterality Date  . BRONCHOSCOPY  09-2008   Negative  . CESAREAN SECTION    . TUBAL LIGATION     1995   Social History   Social History Narrative   ** Merged History Encounter **       Immunization History  Administered Date(s) Administered  . Influenza Split 06/12/2012  . Influenza Whole 05/04/2011  . Influenza,inj,Quad PF,6+ Mos 07/16/2013, 06/16/2014, 07/03/2018, 05/31/2019, 08/04/2020  . PFIZER(Purple Top)SARS-COV-2 Vaccination 11/27/2019, 12/18/2019, 08/20/2020  . Pneumococcal Polysaccharide-23 05/04/2011  . Tdap 03/07/2012     Objective: Vital Signs: BP 121/75 (BP Location: Left Arm, Patient Position: Sitting, Cuff Size: Normal)   Pulse (!) 111   Ht _0  (1.6 m)   Wt 177 lb 9.6 oz (80.6 kg)   BMI 31.46 kg/m    Physical Exam Vitals and nursing note reviewed.  Constitutional:      Appearance: She is well-developed and well-nourished.  HENT:     Head: Normocephalic and atraumatic.  Eyes:     Extraocular Movements: EOM normal.     Conjunctiva/sclera: Conjunctivae normal.  Cardiovascular:     Rate and Rhythm: Normal rate and regular rhythm.     Pulses: Intact distal pulses.     Heart sounds: Normal heart sounds.  Pulmonary:     Effort: Pulmonary effort is normal.     Breath sounds: Normal breath sounds.  Abdominal:     General: Bowel sounds are normal.     Palpations: Abdomen is soft.  Musculoskeletal:     Cervical back: Normal range of motion.   Lymphadenopathy:     Cervical: No cervical adenopathy.  Skin:    General: Skin is warm and dry.     Capillary Refill: Capillary refill takes less than 2 seconds.  Neurological:     Mental Status: She is alert and oriented to person, place, and time.  Psychiatric:        Mood and Affect: Mood and affect normal.        Behavior: Behavior normal.      Musculoskeletal Exam: C-spine was in good range of motion.  Shoulder joints and elbow joints in good range of motion.  She had tenderness over some of the PIPs and MCPs as described below.  No synovitis was noted.  She had discomfort range of motion of bilateral knee joints without synovitis.  She tenderness over bilateral first MTP without synovitis.  She also had discomfort range of motion of bilateral shoulders and her hip joints.  CDAI Exam: CDAI Score: 9  Patient Global: 6 mm; Provider Global: 4 mm Swollen: 0 ; Tender: 8  Joint Exam 10/15/2020      Right  Left  Glenohumeral   Tender   Tender  MCP 1      Tender  MCP 2      Tender  MCP 3   Tender   Tender  Knee   Tender   Tender     Investigation: No additional findings.  Imaging: DG Chest 2 View  Result Date: 09/17/2020 CLINICAL DATA:  Sarcoidosis EXAM: CHEST - 2 VIEW COMPARISON:  August 04, 2020 FINDINGS: There is stable diffuse interstitial thickening with reticulonodular interstitial disease bilaterally with an upper lobe predominance, stable. No frank edema or consolidation. Heart size normal. No appreciable adenopathy evident by radiography. IMPRESSION: Reticulonodular interstitial disease and interstitial thickening with upper lobe predominance, a stable finding likely due to sarcoidosis. No new opacity evident. Heart size normal. No adenopathy appreciable by radiography. Electronically Signed   By: Lowella Grip III M.D.   On: 09/17/2020 14:48    Recent Labs: Lab Results  Component Value Date   WBC 3.5 (L) 10/12/2020   HGB 11.4 (L) 10/12/2020   PLT 170.0  10/12/2020   NA 135 08/04/2020   K 3.7 08/04/2020   CL 102 08/04/2020   CO2 29 08/04/2020   GLUCOSE 80 08/04/2020   BUN 14 08/04/2020   CREATININE 0.67 08/04/2020   BILITOT 0.3 04/03/2020   ALKPHOS 37 (L) 02/27/2019   AST 18 04/03/2020   ALT 10 04/03/2020   PROT 8.5 (H) 04/03/2020   ALBUMIN 3.7 02/27/2019   CALCIUM 8.6 08/04/2020   GFRAA 93 06/26/2017   October 12, 2020 iron studies normal, ANA 1: 80 speckled, ESR 48, RF negative, ferritin normal, is 56  December 19, 2016 TB Gold negative, acute hepatitis panel negative, HIV negative, SPEP nonspecific, IgG elevated, IgM low.  September 17, 2020 chest x-ray showed reticular kilo nodular interstitial disease and interstitial thickening of the upper lobe predominance, a stable finding likely due to sarcoidosis.  No adenopathy was noted.  Per Dr. Vernie Murders Comments: No specialty comments available.  Procedures:  No procedures performed Allergies: Hydrocodone   Assessment / Plan:     Visit Diagnoses: Sarcoidosis ([place]) -patient had pulmonary sarcoidosis in the past.  She states in November 2021 she presented with hemoptysis and shortness of breath.  She was was evaluated by Dr. Annamaria Boots.  She was placed on prednisone 10 mg p.o. daily for a month.  She came off prednisone in January.  She states Dr. Hulen Luster felt her symptoms were consistent with bronchitis.  She was also told that she had pulmonary damage due to sarcoidosis.  She presented a week ago with severe pain and discomfort in multiple joints to the point she is having difficulty walking and shortness of breath.  She was treated with methotrexate 6 tablets p.o. weekly in the past which she tolerated well and had no flares.  She has been off methotrexate since December 2018.  She wants to restart on methotrexate.  She has been experiencing increased discomfort.  Indications side effects contraindications of methotrexate were discussed.  Handout was given and consent was taken.  Once we  have labs results available we will start her on methotrexate 6 tablets p.o. weekly along with folic acid 1 mg p.o. daily.  We will check labs in 2 weeks, 2 months and then every 3 months to monitor for drug toxicity.  Drug Counseling TB Gold: Pending Hepatitis panel: 2018  Chest-xray: January 2022  Contraception: Not indicated  Alcohol use: Denies consumption  Patient was counseled on the purpose, proper use, and adverse effects of methotrexate including nausea, infection, and signs and symptoms of pneumonitis.  Reviewed instructions with patient to take methotrexate weekly along with folic acid daily.  Discussed the importance of frequent monitoring of kidney and liver function and blood counts, and provided patient with standing lab instructions.  Counseled patient to avoid NSAIDs and alcohol while on methotrexate.  Provided patient with educational materials on methotrexate and answered all questions.  Advised patient to get annual influenza vaccine and to get a pneumococcal vaccine if patient has not already had one.  Patient voiced understanding.  Patient consented to methotrexate use.  Will upload into chart.    High risk medication use - She d/c MTX in Dec 2018. - Plan: COMPLETE METABOLIC PANEL WITH GFR, QuantiFERON-TB Gold Plus  Shortness of breath-patient has been having episodes of shortness of breath since November 2021.  She was on prednisone for 1 month by Dr. Annamaria Boots.  He felt the symptoms were related to bronchitis.  Pain in both hands -she complains of pain and discomfort in her hands.  She had tenderness over multiple joints.  Plan: XR Hand 2 View Right, XR Hand 2 View Left.  X-ray of bilateral hands showed mild osteoarthritic changes.  Acute pain of both knees-she has been experiencing increased pain in bilateral knee joints.  No warmth swelling or effusion was noted.  Pain in both feet-she complains of discomfort in her bilateral feet.  She had some discomfort over first MTP  joint with no synovitis.  Bunion of great toe of right foot  Abnormal laboratory test - Elevated IgG. Patient has been followed by Dr. Alen Blew.   History of anemia-she has chronic anemia.  Other fatigue -she has been experiencing increased fatigue and myalgias.  Plan: CK  Educated but COVID-19 virus infection she is fully vaccinated against COVID-19 virus and also received a booster.  Use of mask, social distancing and hand hygiene was discussed.  Orders: Orders Placed This Encounter  Procedures  . XR Hand 2 View Right  . XR Hand 2 View Left  . COMPLETE METABOLIC PANEL WITH GFR  . CK  . QuantiFERON-TB Gold Plus   Meds ordered this encounter  Medications  . predniSONE (DELTASONE) 5 MG tablet    Sig: Take 4 tabs po qd x 4 days, 3  tabs po qd x 4 days, 2  tabs po qd x 4 days, 1  tab po qd x 4 days    Dispense:  40 tablet    Refill:  0      Follow-Up Instructions: Return in about 6 weeks (around 11/26/2020) for Sarcoidosis.   Bo Merino, MD  Note - This record has been created using Editor, commissioning.  Chart creation errors have been sought, but may not always  have been located. Such creation errors do not reflect on  the standard of medical care.

## 2020-10-14 LAB — ANTI-NUCLEAR AB-TITER (ANA TITER): ANA Titer 1: 1:80 {titer} — ABNORMAL HIGH

## 2020-10-14 LAB — ANGIOTENSIN CONVERTING ENZYME: Angiotensin-Converting Enzyme: 56 U/L (ref 9–67)

## 2020-10-14 LAB — ANA: Anti Nuclear Antibody (ANA): POSITIVE — AB

## 2020-10-14 LAB — RHEUMATOID FACTOR: Rheumatoid fact SerPl-aCnc: <14 IU/mL (ref <14)

## 2020-10-15 ENCOUNTER — Ambulatory Visit: Payer: Self-pay

## 2020-10-15 ENCOUNTER — Encounter: Payer: Self-pay | Admitting: Family

## 2020-10-15 ENCOUNTER — Ambulatory Visit (INDEPENDENT_AMBULATORY_CARE_PROVIDER_SITE_OTHER): Payer: BC Managed Care – PPO | Admitting: Rheumatology

## 2020-10-15 ENCOUNTER — Telehealth: Payer: Self-pay | Admitting: Internal Medicine

## 2020-10-15 ENCOUNTER — Other Ambulatory Visit: Payer: Self-pay

## 2020-10-15 ENCOUNTER — Encounter: Payer: Self-pay | Admitting: Rheumatology

## 2020-10-15 VITALS — BP 121/75 | HR 111 | Ht 63.0 in | Wt 177.6 lb

## 2020-10-15 DIAGNOSIS — Z79899 Other long term (current) drug therapy: Secondary | ICD-10-CM

## 2020-10-15 DIAGNOSIS — M21611 Bunion of right foot: Secondary | ICD-10-CM

## 2020-10-15 DIAGNOSIS — M79642 Pain in left hand: Secondary | ICD-10-CM | POA: Diagnosis not present

## 2020-10-15 DIAGNOSIS — R0602 Shortness of breath: Secondary | ICD-10-CM

## 2020-10-15 DIAGNOSIS — M25561 Pain in right knee: Secondary | ICD-10-CM

## 2020-10-15 DIAGNOSIS — Z1211 Encounter for screening for malignant neoplasm of colon: Secondary | ICD-10-CM | POA: Diagnosis not present

## 2020-10-15 DIAGNOSIS — R5383 Other fatigue: Secondary | ICD-10-CM | POA: Diagnosis not present

## 2020-10-15 DIAGNOSIS — M25562 Pain in left knee: Secondary | ICD-10-CM

## 2020-10-15 DIAGNOSIS — D869 Sarcoidosis, unspecified: Secondary | ICD-10-CM | POA: Diagnosis not present

## 2020-10-15 DIAGNOSIS — Z7189 Other specified counseling: Secondary | ICD-10-CM

## 2020-10-15 DIAGNOSIS — R899 Unspecified abnormal finding in specimens from other organs, systems and tissues: Secondary | ICD-10-CM

## 2020-10-15 DIAGNOSIS — M79672 Pain in left foot: Secondary | ICD-10-CM

## 2020-10-15 DIAGNOSIS — M79641 Pain in right hand: Secondary | ICD-10-CM | POA: Diagnosis not present

## 2020-10-15 DIAGNOSIS — Z862 Personal history of diseases of the blood and blood-forming organs and certain disorders involving the immune mechanism: Secondary | ICD-10-CM

## 2020-10-15 DIAGNOSIS — Z1212 Encounter for screening for malignant neoplasm of rectum: Secondary | ICD-10-CM | POA: Diagnosis not present

## 2020-10-15 DIAGNOSIS — M79671 Pain in right foot: Secondary | ICD-10-CM

## 2020-10-15 LAB — COLOGUARD: Cologuard: NEGATIVE

## 2020-10-15 MED ORDER — PREDNISONE 5 MG PO TABS
ORAL_TABLET | ORAL | 0 refills | Status: DC
Start: 2020-10-15 — End: 2020-11-16

## 2020-10-15 NOTE — Patient Instructions (Addendum)
Methotrexate tablets What is this medicine? METHOTREXATE (METH oh TREX ate) is a chemotherapy drug used to treat cancer including breast cancer, leukemia, and lymphoma. This medicine can also be used to treat psoriasis and certain kinds of arthritis. This medicine may be used for other purposes; ask your health care provider or pharmacist if you have questions. COMMON BRAND NAME(S): Rheumatrex, Trexall What should I tell my health care provider before I take this medicine? They need to know if you have any of these conditions:  fluid in the stomach area or lungs  if you often drink alcohol  infection or immune system problems  kidney disease or on hemodialysis  liver disease  low blood counts, like low white cell, platelet, or red cell counts  lung disease  radiation therapy  stomach ulcers  ulcerative colitis  an unusual or allergic reaction to methotrexate, other medicines, foods, dyes, or preservatives  pregnant or trying to get pregnant  breast-feeding How should I use this medicine? Take this medicine by mouth with a glass of water. Follow the directions on the prescription label. Take your medicine at regular intervals. Do not take it more often than directed. Do not stop taking except on your doctor's advice. Make sure you know why you are taking this medicine and how often you should take it. If this medicine is used for a condition that is not cancer, like arthritis or psoriasis, it should be taken weekly, NOT daily. Taking this medicine more often than directed can cause serious side effects, even death. Talk to your healthcare provider about safe handling and disposal of this medicine. You may need to take special precautions. Talk to your pediatrician regarding the use of this medicine in children. While this drug may be prescribed for selected conditions, precautions do apply. Overdosage: If you think you have taken too much of this medicine contact a poison control  center or emergency room at once. NOTE: This medicine is only for you. Do not share this medicine with others. What if I miss a dose? If you miss a dose, talk with your doctor or health care professional. Do not take double or extra doses. What may interact with this medicine? Do not take this medicine with any of the following medications:  acitretin This medicine may also interact with the following medication:  aspirin and aspirin-like medicines including salicylates  azathioprine  certain antibiotics like penicillins, tetracycline, and chloramphenicol  certain medicines that treat or prevent blood clots like warfarin, apixaban, dabigatran, and rivaroxaban  certain medicines for stomach problems like esomeprazole, omeprazole, pantoprazole  cyclosporine  dapsone  diuretics  gold  hydroxychloroquine  live virus vaccines  medicines for infection like acyclovir, adefovir, amphotericin B, bacitracin, cidofovir, foscarnet, ganciclovir, gentamicin, pentamidine, vancomycin  mercaptopurine  NSAIDs, medicines for pain and inflammation, like ibuprofen or naproxen  other cytotoxic agents  pamidronate  pemetrexed  penicillamine  phenylbutazone  phenytoin  probenecid  pyrimethamine  retinoids such as isotretinoin and tretinoin  steroid medicines like prednisone or cortisone  sulfonamides like sulfasalazine and trimethoprim/sulfamethoxazole  theophylline  zoledronic acid This list may not describe all possible interactions. Give your health care provider a list of all the medicines, herbs, non-prescription drugs, or dietary supplements you use. Also tell them if you smoke, drink alcohol, or use illegal drugs. Some items may interact with your medicine. What should I watch for while using this medicine? Avoid alcoholic drinks. This medicine can make you more sensitive to the sun. Keep out of the sun.   If you cannot avoid being in the sun, wear protective clothing  and use sunscreen. Do not use sun lamps or tanning beds/booths. You may need blood work done while you are taking this medicine. Call your doctor or health care professional for advice if you get a fever, chills or sore throat, or other symptoms of a cold or flu. Do not treat yourself. This drug decreases your body's ability to fight infections. Try to avoid being around people who are sick. This medicine may increase your risk to bruise or bleed. Call your doctor or health care professional if you notice any unusual bleeding. Be careful brushing or flossing your teeth or using a toothpick because you may get an infection or bleed more easily. If you have any dental work done, tell your dentist you are receiving this medicine. Check with your doctor or health care professional if you get an attack of severe diarrhea, nausea and vomiting, or if you sweat a lot. The loss of too much body fluid can make it dangerous for you to take this medicine. Talk to your doctor about your risk of cancer. You may be more at risk for certain types of cancers if you take this medicine. Do not become pregnant while taking this medicine or for 6 months after stopping it. Women should inform their health care provider if they wish to become pregnant or think they might be pregnant. Men should not father a child while taking this medicine and for 3 months after stopping it. There is potential for serious harm to an unborn child. Talk to your health care provider for more information. Do not breast-feed an infant while taking this medicine or for 1 week after stopping it. This medicine may make it more difficult to get pregnant or father a child. Talk to your health care provider if you are concerned about your fertility. What side effects may I notice from receiving this medicine? Side effects that you should report to your doctor or health care professional as soon as possible:  allergic reactions like skin rash, itching or  hives, swelling of the face, lips, or tongue  breathing problems or shortness of breath  diarrhea  dry, nonproductive cough  low blood counts - this medicine may decrease the number of white blood cells, red blood cells and platelets. You may be at increased risk for infections and bleeding.  mouth sores  redness, blistering, peeling or loosening of the skin, including inside the mouth  signs of infection - fever or chills, cough, sore throat, pain or trouble passing urine  signs and symptoms of bleeding such as bloody or black, tarry stools; red or dark-brown urine; spitting up blood or brown material that looks like coffee grounds; red spots on the skin; unusual bruising or bleeding from the eye, gums, or nose  signs and symptoms of kidney injury like trouble passing urine or change in the amount of urine  signs and symptoms of liver injury like dark yellow or brown urine; general ill feeling or flu-like symptoms; light-colored stools; loss of appetite; nausea; right upper belly pain; unusually weak or tired; yellowing of the eyes or skin Side effects that usually do not require medical attention (report to your doctor or health care professional if they continue or are bothersome):  dizziness  hair loss  tiredness  upset stomach  vomiting This list may not describe all possible side effects. Call your doctor for medical advice about side effects. You may report side effects to  FDA at 1-800-FDA-1088. Where should I keep my medicine? Keep out of the reach of children and pets. Store at room temperature between 20 and 25 degrees C (68 and 77 degrees F). Protect from light. Get rid of any unused medicine after the expiration date. Talk to your health care provider about how to dispose of unused medicine. Special directions may apply. NOTE: This sheet is a summary. It may not cover all possible information. If you have questions about this medicine, talk to your doctor, pharmacist,  or health care provider.  2021 Elsevier/Gold Standard (2020-03-23 10:40:39)  Standing Labs We placed an order today for your standing lab work.   Please have your standing labs drawn in 2 weeks, 2 months and then every 3 months.  If possible, please have your labs drawn 2 weeks prior to your appointment so that the provider can discuss your results at your appointment.  We have open lab daily Monday through Thursday from 1:30-4:30 PM and Friday from 1:30-4:00 PM at the office of Dr. Pollyann Savoy, [facility].   Please be advised, all patients with office appointments requiring lab work will take precedents over walk-in lab work.  If possible, please come for your lab work on Monday and Friday afternoons, as you may experience shorter wait times. The office is located at [address], [address], [place], [place] [address] No appointment is necessary.   Labs are drawn by [facility]. Please bring your co-pay at the time of your lab draw.  You may receive a bill from [facility] for your lab work.  If you wish to have your labs drawn at another location, please call the office 24 hours in advance to send orders.  If you have any questions regarding directions or hours of operation,  please call [phone].   As a reminder, please drink plenty of water prior to coming for your lab work. Thanks!  Vaccines You are taking a medication(s) that can suppress your immune system.  The following immunizations are recommended: . Flu annually . Covid-19  . Pneumonia (Pneumovax 23 and Prevnar 13 spaced at least 1 year apart) . Shingrix (after age 105)  Please check with your PCP to make sure you are up to date.  Heart Disease Prevention   Your inflammatory disease increases your risk of heart disease which includes heart attack, stroke, atrial fibrillation (irregular heartbeats), high blood pressure, heart failure and atherosclerosis (plaque in the arteries).  It is important to reduce  your risk by:   . Keep blood pressure, cholesterol, and blood sugar at healthy levels   . Smoking Cessation   . Maintain a healthy weight  o BMI 20-25   . Eat a healthy diet  o Plenty of fresh fruit, vegetables, and whole grains  o Limit saturated fats, foods high in sodium, and added sugars  o DASH and Mediterranean diet   . Increase physical activity  o Recommend moderate physically activity for 150 minutes per week/ 30 minutes a day for five days a week These can be broken up into three separate ten-minute sessions during the day.   . Reduce Stress  . Meditation, slow breathing exercises, yoga, coloring books  . Dental visits twice a year

## 2020-10-15 NOTE — Telephone Encounter (Signed)
Dr.  Maple Hudson, Please see patient comment.  Thank you  Patrice, Do you have this paperwork?  Please advise.  Thank you.

## 2020-10-15 NOTE — Progress Notes (Signed)
Patient saw rheumatology on 10/15/20 and treatment will be managed by rheumatology.

## 2020-10-15 NOTE — Telephone Encounter (Signed)
Pending lab results, patient will be starting methotrexate and folic acid per Dr. Corliss Skains.   Consent obtained and sent to the scan center.

## 2020-10-16 NOTE — Progress Notes (Signed)
CMP and CK are normal.

## 2020-10-17 LAB — QUANTIFERON-TB GOLD PLUS
Mitogen-NIL: >10 IU/mL
NIL: 0.01 IU/mL
QuantiFERON-TB Gold Plus: NEGATIVE
TB1-NIL: 0.01 IU/mL
TB2-NIL: 0.01 IU/mL

## 2020-10-17 LAB — COMPLETE METABOLIC PANEL WITH GFR
AG Ratio: 1 (calc) (ref 1.0–2.5)
ALT: 10 U/L (ref 6–29)
AST: 19 U/L (ref 10–35)
Albumin: 4 g/dL (ref 3.6–5.1)
Alkaline phosphatase (APISO): 58 U/L (ref 37–153)
BUN: 14 mg/dL (ref 7–25)
CO2: 27 mmol/L (ref 20–32)
Calcium: 9.1 mg/dL (ref 8.6–10.4)
Chloride: 106 mmol/L (ref 98–110)
Creat: 0.96 mg/dL (ref 0.50–1.05)
GFR, Est African American: 80 mL/min/{1.73_m2} (ref >=60)
GFR, Est Non African American: 69 mL/min/{1.73_m2} (ref >=60)
Globulin: 4.1 g/dL (calc) — ABNORMAL HIGH (ref 1.9–3.7)
Glucose, Bld: 73 mg/dL (ref 65–99)
Potassium: 4.5 mmol/L (ref 3.5–5.3)
Sodium: 138 mmol/L (ref 135–146)
Total Bilirubin: 0.3 mg/dL (ref 0.2–1.2)
Total Protein: 8.1 g/dL (ref 6.1–8.1)

## 2020-10-17 LAB — CK: Total CK: 69 U/L (ref 29–143)

## 2020-10-19 MED ORDER — METHOTREXATE 2.5 MG PO TABS: 15.0000 mg | ORAL_TABLET | ORAL | 2 refills | Status: DC

## 2020-10-19 MED ORDER — FOLIC ACID 1 MG PO TABS: 1.0000 mg | ORAL_TABLET | Freq: Every day | ORAL | 3 refills | Status: DC

## 2020-10-19 NOTE — Telephone Encounter (Signed)
Labs have resulted CMP and CK are normal. TB Gold Negative.   Per office note on 10/15/2020: methotrexate 6 tablets p.o. weekly along with folic acid 1 mg p.o. daily.

## 2020-10-19 NOTE — Telephone Encounter (Signed)
Rec'd accommodation paperwork. Given to Dr. Maple Hudson to complete. -pr

## 2020-10-20 NOTE — Telephone Encounter (Signed)
Noted  

## 2020-10-22 NOTE — Telephone Encounter (Signed)
Rec'd completed paperwork - Faxed to [employer] at [phone]

## 2020-10-24 LAB — EXTERNAL GENERIC LAB PROCEDURE: COLOGUARD: NEGATIVE

## 2020-10-24 LAB — COLOGUARD: COLOGUARD: NEGATIVE

## 2020-10-29 ENCOUNTER — Encounter: Payer: Self-pay | Admitting: Family

## 2020-11-13 NOTE — Telephone Encounter (Signed)
Have you seen paperwork for this patient?

## 2020-11-13 NOTE — Telephone Encounter (Signed)
Mandi, can you please follow up on this paperwork.  Thank you.

## 2020-11-16 ENCOUNTER — Ambulatory Visit (INDEPENDENT_AMBULATORY_CARE_PROVIDER_SITE_OTHER): Payer: BC Managed Care – PPO

## 2020-11-16 ENCOUNTER — Other Ambulatory Visit: Payer: Self-pay

## 2020-11-16 ENCOUNTER — Telehealth: Payer: Self-pay | Admitting: Internal Medicine

## 2020-11-16 ENCOUNTER — Ambulatory Visit (INDEPENDENT_AMBULATORY_CARE_PROVIDER_SITE_OTHER): Payer: BC Managed Care – PPO | Admitting: Primary Care

## 2020-11-16 ENCOUNTER — Encounter: Payer: Self-pay | Admitting: Primary Care

## 2020-11-16 VITALS — BP 116/64 | HR 97 | Temp 97.3°F | Ht 63.0 in | Wt 177.0 lb

## 2020-11-16 DIAGNOSIS — J40 Bronchitis, not specified as acute or chronic: Secondary | ICD-10-CM | POA: Diagnosis not present

## 2020-11-16 DIAGNOSIS — J209 Acute bronchitis, unspecified: Secondary | ICD-10-CM

## 2020-11-16 DIAGNOSIS — D869 Sarcoidosis, unspecified: Secondary | ICD-10-CM | POA: Diagnosis not present

## 2020-11-16 MED ORDER — AZITHROMYCIN 250 MG PO TABS: ORAL_TABLET | ORAL | 0 refills | Status: DC

## 2020-11-16 MED ORDER — PREDNISONE 10 MG PO TABS: ORAL_TABLET | ORAL | 0 refills | Status: DC

## 2020-11-16 NOTE — Progress Notes (Signed)
@Patient  ID: , female    DOB: [birth year], 50 y.o.   MRN: [id number]  Chief Complaint  Patient presents with  . Acute Visit    Pt has had complaints of cough, wheezing, and increased SOB that has been happening for about a week. Pt will occ cough up yellow-green phlegm. Pt also has had some chest pains.    Referring provider: [id number],*  HPI: 50 year old female, former smoker.  Past medical history significant for allergic rhinitis, anemia, obesity, sarcoidosis.  Patient of Dr. 44, last seen in office on 09/17/2020.  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 Complicated by Iron def anemia, plasma cell disorder. Bronch NEG 09/24/08- dx based on mediastinal adenopathy/ interstitial disease, ACE 90, response to steroids. PFT 10/11/16- ACE 09/30/16- 86H  ACE 12/07/17- 97, up from 89 in 2018 ACE 01/08/19- 67 WNL Immunoglobulins 12/19/16- IgG 2,669   11/16/2020- Interim hx  Patient presents today for acute visit.  Patient called our office today with reports of body aches, sore throat, increased shortness of breath and cough x1 week.  Cough is productive with green mucus.  Associated chest tightness. She has been taking theraflu. She gets bronchitis symptoms 2-3 times a year when she gets any type of respiratory symptoms. Albuterol makes her choke. She has not been using it. No abx since November 2021. She was last on prednisone in January 2022. Denies fevers. No known sick contacts. Works in February 2022 and wears a mask.  She has been vaccinated for Covid including booster. Home covid test today was negative.   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 Complicated by Iron def anemia, plasma cell disorder. Bronch NEG 09/24/08- dx based on mediastinal adenopathy/ interstitial disease, ACE 90,  response to steroids. PFT 10/11/16- ACE 09/30/16- 86H  ACE 12/07/17- 97, up from 89 in 2018 ACE 01/08/19- 67 WNL Immunoglobulins 12/19/16- IgG 2,669  Allergies  Allergen Reactions  . Hydrocodone Nausea And Vomiting    Immunization History  Administered Date(s) Administered  . Influenza Split 06/12/2012  . Influenza Whole 05/04/2011  . Influenza,inj,Quad PF,6+ Mos 07/16/2013, 06/16/2014, 07/03/2018, 05/31/2019, 08/04/2020  . PFIZER(Purple Top)SARS-COV-2 Vaccination 11/27/2019, 12/18/2019, 08/20/2020  . Pneumococcal Polysaccharide-23 05/04/2011  . Tdap 03/07/2012    Past Medical History:  Diagnosis Date  . Abnormal chest x-ray   . Anemia   . Hemoptysis   . Sarcoid    bronch negative 09-2008 ACE 93  . Unspecified asthma(493.90)     Tobacco History: Social History   Tobacco Use  Smoking Status Former Smoker  . Packs/day: 0.25  . Years: 10.00  . Pack years: 2.50  . Types: Cigarettes  . Start date: 2001  . Quit date: 08/05/2008  . Years since quitting: 12.2  Smokeless Tobacco Never Used   Counseling given: Not Answered   Outpatient Medications Prior to Visit  Medication Sig Dispense Refill  . albuterol (VENTOLIN HFA) 108 (90 Base) MCG/ACT inhaler TAKE 2 PUFFS BY MOUTH EVERY 6 HOURS AS NEEDED FOR WHEEZE OR SHORTNESS OF BREATH 6.7 g 1  . folic acid (FOLVITE) 1 MG tablet Take 1 tablet (1 mg total) by mouth daily. 90 tablet 3  . methotrexate (RHEUMATREX) 2.5 MG tablet Take 6 tablets (15 mg total) by mouth once a week. Caution:Chemotherapy. Protect from light. 24 tablet 2  . Multiple Vitamins-Minerals (MULTIVITAMIN WITH MINERALS) tablet Take 1 tablet by  mouth daily. 30 tablet 11  . venlafaxine XR (EFFEXOR XR) 75 MG 24 hr capsule Take 1 tablet daily x 1 week; 90 capsule 1  . ferrous fumarate-iron polysaccharide complex (TANDEM) 162-115.2 MG CAPS capsule Take 1 capsule by mouth daily with breakfast. (Patient not taking: No sig reported) 30 capsule 11   No facility-administered  medications prior to visit.   Review of Systems  Review of Systems  Constitutional: Negative for fever.  HENT:       Voice hoarseness   Respiratory: Positive for cough, shortness of breath and wheezing.    Physical Exam  BP 116/64 (BP Location: Right Arm, Cuff Size: Normal)   Pulse 97   Temp (!) 97.3 F (36.3 C) (Temporal)   Ht 5\' 3"  (1.6 m)   Wt 177 lb (80.3 kg)   SpO2 99% Comment: RA  BMI 31.35 kg/m  Physical Exam Constitutional:      General: She is not in acute distress.    Appearance: Normal appearance. She is not ill-appearing.  HENT:     Mouth/Throat:     Comments: Deferred d/t masking Cardiovascular:     Rate and Rhythm: Normal rate and regular rhythm.  Pulmonary:     Effort: Pulmonary effort is normal.     Breath sounds: No rhonchi or rales.     Comments: Reactive asthmatic cough Musculoskeletal:        General: Normal range of motion.  Neurological:     General: No focal deficit present.     Mental Status: She is alert and oriented to person, place, and time. Mental status is at baseline.  Psychiatric:        Mood and Affect: Mood normal.        Behavior: Behavior normal.        Thought Content: Thought content normal.        Judgment: Judgment normal.      Lab Results:  CBC    Component Value Date/Time   WBC 3.5 (L) 10/12/2020 1407   RBC 4.21 10/12/2020 1407   HGB 11.4 (L) 10/12/2020 1407   HCT 35.4 (L) 10/12/2020 1407   PLT 170.0 10/12/2020 1407   MCV 84.1 10/12/2020 1407   MCV 78.5 (A) 10/06/2016 0849   MCH 26.6 (L) 04/03/2020 1409   MCHC 32.2 10/12/2020 1407   RDW 13.9 10/12/2020 1407   LYMPHSABS 1.7 10/12/2020 1407   MONOABS 0.4 10/12/2020 1407   EOSABS 0.1 10/12/2020 1407   BASOSABS 0.0 10/12/2020 1407    BMET    Component Value Date/Time   NA 138 10/15/2020 1333   NA 136 10/06/2016 0840   K 4.5 10/15/2020 1333   CL 106 10/15/2020 1333   CO2 27 10/15/2020 1333   GLUCOSE 73 10/15/2020 1333   BUN 14 10/15/2020 1333   BUN 13  10/06/2016 0840   CREATININE 0.96 10/15/2020 1333   CALCIUM 9.1 10/15/2020 1333   GFRNONAA 69 10/15/2020 1333   GFRAA 80 10/15/2020 1333    BNP No results found for: BNP  ProBNP No results found for: PROBNP  Imaging: DG Chest 2 View  Result Date: 11/16/2020 CLINICAL DATA:  Sarcoidosis, acute bronchitis. EXAM: CHEST - 2 VIEW COMPARISON:  September 17, 2020. FINDINGS: The heart size and mediastinal contours are within normal limits. No pneumothorax or pleural effusion is noted. Stable reticular densities are noted throughout both lungs most consistent with scarring related or sarcoidosis. The visualized skeletal structures are unremarkable. IMPRESSION: Stable bilateral reticular densities are noted most  consistent with scarring related or sarcoidosis. Electronically Signed   By: Lupita Raider M.D.   On: 11/16/2020 16:12     Assessment & Plan:   Acute bronchitis - Productive cough with purulent mucus x1. Associated body aches, sore throat, shortness of breath and chest tightness a. CXR showed no acute cardiopulmonary process. Treating conservatively with zpack and prednisone taper d/t underlying sarcoidosis. Recommend patient take Mucinex 600mg  twice daily, Delsym cough syrup at night and Tylenol prn fever/chills/aches. If no improvement or develops fever recommend PCR covid testing AND sputum cultures.    , NP 11/16/2020

## 2020-11-16 NOTE — Patient Instructions (Addendum)
Recommendations: - Take Mucinex 600mg  twice during the day  - Take Delsym at night  - Take tylenol 650mg  every 6 hours for fever, chills, aches   Rx: - Prednisone taper as prescribed  Orders: - CXR re: bronchitis   Follow-up - May with Dr. as scheduled/ call if not better    Acute Bronchitis, Adult  Acute bronchitis is when air tubes in the lungs (bronchi) suddenly get swollen. The condition can make it hard for you to breathe. In adults, acute bronchitis usually goes away within 2 weeks. A cough caused by bronchitis may last up to 3 weeks. Smoking, allergies, and asthma can make the condition worse. What are the causes? This condition is caused by:  Cold and flu viruses. The most common cause of this condition is the virus that causes the common cold.  Bacteria.  Substances that irritate the lungs, including: ? Smoke from cigarettes and other types of tobacco. ? Dust and pollen. ? Fumes from chemicals, gases, or burned fuel. ? Other materials that pollute indoor or outdoor air.  Close contact with someone who has acute bronchitis. What increases the risk? The following factors may make you more likely to develop this condition:  A weak body's defense system. This is also called the immune system.  Any condition that affects your lungs and breathing, such as asthma. What are the signs or symptoms? Symptoms of this condition include:  A cough.  Coughing up clear, yellow, or green mucus.  Wheezing.  Chest congestion.  Shortness of breath.  A fever.  Body aches.  Chills.  A sore throat. How is this treated? Acute bronchitis may go away over time without treatment. Your doctor may recommend:  Drinking more fluids.  Taking a medicine for a fever or cough.  Using a device that gets medicine into your lungs (inhaler).  Using a vaporizer or a humidifier. These are machines that add water or moisture in the air to help with coughing and poor  breathing. Follow these instructions at home: Activity  Get a lot of rest.  Avoid places where there are fumes from chemicals.  Return to your normal activities as told by your doctor. Ask your doctor what activities are safe for you. Lifestyle  Drink enough fluids to keep your pee (urine) pale yellow.  Do not drink alcohol.  Do not use any products that contain nicotine or tobacco, such as cigarettes, e-cigarettes, and chewing tobacco. If you need help quitting, ask your doctor. Be aware that: ? Your bronchitis will get worse if you smoke or breathe in other people's smoke (secondhand smoke). ? Your lungs will heal faster if you quit smoking. General instructions  Take over-the-counter and prescription medicines only as told by your doctor.  Use an inhaler, cool mist vaporizer, or humidifier as told by your doctor.  Rinse your mouth often with salt water. To make salt water, dissolve -1 tsp (3-6 g) of salt in 1 cup (237 mL) of warm water.  Keep all follow-up visits as told by your doctor. This is important.   How is this prevented? To lower your risk of getting this condition again:  Wash your hands often with soap and water. If soap and water are not available, use hand sanitizer.  Avoid contact with people who have cold symptoms.  Try not to touch your mouth, nose, or eyes with your hands.  Make sure to get the flu shot every year.   Contact a doctor if:  Your symptoms  do not get better in 2 weeks.  You vomit more than once or twice.  You have symptoms of loss of fluid from your body (dehydration). These include: ? Dark urine. ? Dry skin or eyes. ? Increased thirst. ? Headaches. ? Confusion. ? Muscle cramps. Get help right away if:  You cough up blood.  You have chest pain.  You have very bad shortness of breath.  You become dehydrated.  You faint or keep feeling like you are going to faint.  You keep vomiting.  You have a very bad headache.  Your  fever or chills get worse. These symptoms may be an emergency. Do not wait to see if the symptoms will go away. Get medical help right away. Call your local emergency services (911 in the [place] Do not drive yourself to the hospital. Summary  Acute bronchitis is when air tubes in the lungs (bronchi) suddenly get swollen. In adults, acute bronchitis usually goes away within 2 weeks.  Take over-the-counter and prescription medicines only as told by your doctor.  Drink enough fluid to keep your pee (urine) pale yellow.  Contact a doctor if your symptoms do not improve after 2 weeks of treatment.  Get help right away if you cough up blood, faint, or have chest pain or shortness of breath. This information is not intended to replace advice given to you by your health care provider. Make sure you discuss any questions you have with your health care provider. Document Revised: 03/15/2019 Document Reviewed: 03/15/2019 Elsevier Patient Education  2021 [employer].

## 2020-11-16 NOTE — Telephone Encounter (Signed)
Spoke with the pt and scheduled appt with BW for 3:30 today, advised arrive at 3:15 pm.

## 2020-11-16 NOTE — Telephone Encounter (Signed)
Yes- ok for NP visit

## 2020-11-16 NOTE — Assessment & Plan Note (Addendum)
-   Productive cough with purulent mucus x1. Associated body aches, sore throat, shortness of breath and chest tightness a. CXR showed no acute cardiopulmonary process. Treating conservatively with zpack and prednisone taper d/t underlying sarcoidosis. Recommend patient take Mucinex 600mg  twice daily, Delsym cough syrup at night and Tylenol prn fever/chills/aches. If no improvement or develops fever recommend PCR covid testing AND sputum cultures.

## 2020-11-16 NOTE — Telephone Encounter (Signed)
Spoke with the pt  She states 1 wk ago started to have body aches, then sore throat  This lasted a couple of days and got better, but now having increased SOB and cough  She is coughing up green sputum and states also having chest tightness  She has not had any fever  She has not been tested for covid  She has had all 3 covid vacccines  Is she okay for in person visit? NP has opening this afternoon

## 2020-11-18 MED ORDER — PROMETHAZINE-CODEINE 6.25-10 MG/5ML PO SYRP: 5.0000 mL | ORAL_SOLUTION | ORAL | 0 refills | Status: DC | PRN

## 2020-11-18 MED ORDER — PROMETHAZINE-CODEINE 6.25-10 MG/5ML PO SYRP: 5.0000 mL | ORAL_SOLUTION | ORAL | 0 refills | Status: AC | PRN

## 2020-11-18 MED ORDER — PROMETHAZINE-CODEINE 6.25-10 MG/5ML PO SYRP
5.0000 mL | ORAL_SOLUTION | Freq: Four times a day (QID) | ORAL | 0 refills | Status: DC | PRN
Start: 2020-11-18 — End: 2020-11-18

## 2020-11-18 NOTE — Telephone Encounter (Signed)
Called and spoke with patient. She verbalized understanding of results. She stated that she has not tried the phenergan with codeine cough syrup before. She is aware to finish the zpak and prednisone.

## 2020-11-18 NOTE — Telephone Encounter (Signed)
Primary Pulmonologist: CY Last office visit and with whom: 11/16/20 with Beth  What do we see them for (pulmonary problems): chronic bronchitis and sarcoidosis Last OV assessment/plan: Recommendations: - Take Mucinex 600mg  twice during the day  - Take Delsym at night  - Take tylenol 650mg  every 6 hours for fever, chills, aches   Rx: - Prednisone taper as prescribed  Orders: - CXR re: bronchitis   Follow-up - May with Dr. as scheduled/ call if not better   Was appointment offered to patient (explain)?  Patient was seen on 11/16/20 and wanted recommendations.    Reason for call: Patient was seen on 11/16/20 for possible bronchitis. She completed a CXR but hasn't been told the results yet. She was prescribed a zpak and prednisone taper. She noticed yesterday afternoon that her cough had changed and she was no longer coughing up phlegm, but instead, coughing up bright red blood. She had a few more coughing spells last night as well as this morning. She has noticed an increase in her SOB. She has chest pressure in the middle of her chest. She also has some increase middle back pressure as well. She denied any fevers or body aches. Also denied starting any new medications since her last visit as well as not being on any blood thinners. Slight increase in wheezing.   (examples of things to ask: : When did symptoms start? Fever? Cough? Productive? Color to sputum? More sputum than usual? Wheezing? Have you needed increased oxygen? Are you taking your respiratory medications? What over the counter measures have you tried?)  Allergies  Allergen Reactions  . Hydrocodone Nausea And Vomiting    Immunization History  Administered Date(s) Administered  . Influenza Split 06/12/2012  . Influenza Whole 05/04/2011  . Influenza,inj,Quad PF,6+ Mos 07/16/2013, 06/16/2014, 07/03/2018, 05/31/2019, 08/04/2020  . PFIZER(Purple Top)SARS-COV-2 Vaccination 11/27/2019, 12/18/2019, 08/20/2020  .  Pneumococcal Polysaccharide-23 05/04/2011  . Tdap 03/07/2012    CY, can you please advise? Thanks!

## 2020-11-18 NOTE — Telephone Encounter (Signed)
Called and spoke with patient. She stated that she is interested in the phenergan cough syrup. [facility] [address].   CY, please advise. Thanks.

## 2020-11-18 NOTE — Telephone Encounter (Signed)
Script for phenergan with codeine cough syrup sent to her [facility]. Hopefully she can tolerate this well enough.

## 2020-11-18 NOTE — Telephone Encounter (Signed)
Fortunately we have recent CXR showing no new process. Most often this bleeding will be from some small blood vessel injured by recent coughing.  We try to calm the cough.  Recommend quiet activity, bed rest Avoid all aspirin products She is intolerant of hydrocodone. Can she take a phenergan with codeine cough syrup?    Otherwise, ok to use Delsym syrup and throat lozenges Finish the current zithromax and prednisone

## 2020-11-18 NOTE — Telephone Encounter (Signed)
If she needs more help to keep cough calmed down, let us know.

## 2020-11-19 NOTE — Progress Notes (Deleted)
Office Visit Note  Patient: Nancy Villarreal             Date of Birth: 06/03/1971           MRN: [id number]             PCP: Olive Bass, FNP Referring: Olive Bass,* Visit Date: 12/02/2020 Occupation: @GUAROCC @  Subjective:  No chief complaint on file.   History of Present Illness: Nancy Villarreal is a 50 y.o. female ***   Activities of Daily Living:  Patient reports morning stiffness for *** {minute/hour:19697}.   Patient {ACTIONS;DENIES/REPORTS:21021675::"Denies"} nocturnal pain.  Difficulty dressing/grooming: {ACTIONS;DENIES/REPORTS:21021675::"Denies"} Difficulty climbing stairs: {ACTIONS;DENIES/REPORTS:21021675::"Denies"} Difficulty getting out of chair: {ACTIONS;DENIES/REPORTS:21021675::"Denies"} Difficulty using hands for taps, buttons, cutlery, and/or writing: {ACTIONS;DENIES/REPORTS:21021675::"Denies"}  No Rheumatology ROS completed.   PMFS History:  Patient Active Problem List   Diagnosis Date Noted  . Acute bronchitis 11/16/2020  . Rash and nonspecific skin eruption 09/18/2020  . Upper respiratory infection, acute 06/10/2017  . Bilateral hand pain 01/13/2017  . Pain, joint, shoulder, left 01/13/2017  . Pain in left ankle and joints of left foot 01/13/2017  . High risk medication use 01/13/2017  . Nausea 08/09/2016  . Cough 03/09/2016  . Obesity (BMI 30-39.9) 01/22/2016  . Right foot pain 01/21/2016  . Routine general medical examination at a health care facility 12/11/2013  . Visit for screening mammogram 03/07/2012  . Anemia, iron deficiency 02/10/2012  . Sarcoidosis (HCC) 09/17/2008  . Asthma in remission 09/03/2008  . Allergic rhinitis due to pollen 09/03/2008    Past Medical History:  Diagnosis Date  . Abnormal chest x-ray   . Anemia   . Hemoptysis   . Sarcoid    bronch negative 09-2008 ACE 93  . Unspecified asthma(493.90)     Family History  Problem Relation Age of Onset  . Asthma Paternal Grandfather   . Lung cancer  Maternal Grandfather   . Hyperlipidemia Mother   . Hypertension Mother   . Cancer Mother        lung   . Diabetes Father   . Hyperlipidemia Maternal Grandmother   . Stroke Maternal Grandmother   . Cancer Paternal Grandmother   . Alcohol abuse Paternal Grandmother   . Early death Paternal Grandmother   . Healthy Son   . Healthy Daughter    Past Surgical History:  Procedure Laterality Date  . BRONCHOSCOPY  09-2008   Negative  . CESAREAN SECTION    . TUBAL LIGATION     1995   Social History   Social History Narrative   ** Merged History Encounter **       Immunization History  Administered Date(s) Administered  . Influenza Split 06/12/2012  . Influenza Whole 05/04/2011  . Influenza,inj,Quad PF,6+ Mos 07/16/2013, 06/16/2014, 07/03/2018, 05/31/2019, 08/04/2020  . PFIZER(Purple Top)SARS-COV-2 Vaccination 11/27/2019, 12/18/2019, 08/20/2020  . Pneumococcal Polysaccharide-23 05/04/2011  . Tdap 03/07/2012     Objective: Vital Signs: There were no vitals taken for this visit.   Physical Exam   Musculoskeletal Exam: ***  CDAI Exam: CDAI Score: - Patient Global: -; Provider Global: - Swollen: -; Tender: - Joint Exam 12/02/2020   No joint exam has been documented for this visit   There is currently no information documented on the homunculus. Go to the Rheumatology activity and complete the homunculus joint exam.  Investigation: No additional findings.  Imaging: DG Chest 2 View  Result Date: 11/16/2020 CLINICAL DATA:  Sarcoidosis, acute bronchitis. EXAM: CHEST - 2 VIEW COMPARISON:  September 17, 2020. FINDINGS: The heart size and mediastinal contours are within normal limits. No pneumothorax or pleural effusion is noted. Stable reticular densities are noted throughout both lungs most consistent with scarring related or sarcoidosis. The visualized skeletal structures are unremarkable. IMPRESSION: Stable bilateral reticular densities are noted most consistent with scarring  related or sarcoidosis. Electronically Signed   By: Lupita Raider M.D.   On: 11/16/2020 16:12    Recent Labs: Lab Results  Component Value Date   WBC 3.5 (L) 10/12/2020   HGB 11.4 (L) 10/12/2020   PLT 170.0 10/12/2020   NA 138 10/15/2020   K 4.5 10/15/2020   CL 106 10/15/2020   CO2 27 10/15/2020   GLUCOSE 73 10/15/2020   BUN 14 10/15/2020   CREATININE 0.96 10/15/2020   BILITOT 0.3 10/15/2020   ALKPHOS 37 (L) 02/27/2019   AST 19 10/15/2020   ALT 10 10/15/2020   PROT 8.1 10/15/2020   ALBUMIN 3.7 02/27/2019   CALCIUM 9.1 10/15/2020   GFRAA 80 10/15/2020   QFTBGOLDPLUS NEGATIVE 10/15/2020    Speciality Comments: No specialty comments available.  Procedures:  No procedures performed Allergies: Hydrocodone   Assessment / Plan:     Visit Diagnoses: No diagnosis found.  Orders: No orders of the defined types were placed in this encounter.  No orders of the defined types were placed in this encounter.   Face-to-face time spent with patient was *** minutes. Greater than 50% of time was spent in counseling and coordination of care.  Follow-Up Instructions: No follow-ups on file.   Ellen Henri, CMA  Note - This record has been created using [employer]ist.  Chart creation errors have been sought, but may not always  have been located. Such creation errors do not reflect on  the standard of medical care.

## 2020-11-20 ENCOUNTER — Other Ambulatory Visit: Payer: Self-pay | Admitting: Family

## 2020-11-27 NOTE — Progress Notes (Signed)
Office Visit Note  Patient: Nancy Villarreal             Date of Birth: 12-Aug-1971           MRN: [id number]             PCP: Olive Bass, FNP Referring: Olive Bass,* Visit Date: 12/07/2020 Occupation: @GUAROCC @  Subjective:  Medication monitoring   History of Present Illness: Nancy Villarreal is a 50 y.o. female with history of sarcoidosis and osteoarthritis.  She is taking methotrexate 6 tablets by mouth once weekly and folic acid 1 mg by mouth daily.  She was restarted on methotrexate after her labs this visit on 10/15/2020.  According to patient she had to hold methotrexate for about 3 weeks due to developing bronchitis.  She states that she completed a Z-Pak as well as prednisone taper which resolved her symptoms.  She states that her shortness of breath has been stable.  Her cough and hemoptysis improved while taking pheergan with codeine cough syrup.  She denies any pleuritic chest pain.  She has an upcoming appointment with Dr. 12/13/2020 on 02/02/21.  She denies any increased joint pain or inflammation.  She has intermittent pain in both feet due to underlying osteoarthritis.  She denies any discomfort in her hands or knee joints at this time.  She states she has been sleeping well at night and her energy level has improved. She has started walking for exercise.  She denies any rashes, nodules, hair loss, or swollen lymph nodes.    Activities of Daily Living:  Patient reports morning stiffness for 0 minute.   Patient Denies nocturnal pain.  Difficulty dressing/grooming: Denies Difficulty climbing stairs: Denies Difficulty getting out of chair: Denies Difficulty using hands for taps, buttons, cutlery, and/or writing: Denies  Review of Systems  Constitutional: Positive for fatigue.  HENT: Negative for mouth sores, mouth dryness and nose dryness.   Eyes: Negative for pain, itching and dryness.  Respiratory: Positive for shortness of breath. Negative for difficulty  breathing.   Cardiovascular: Negative for chest pain and palpitations.  Gastrointestinal: Negative for blood in stool, constipation and diarrhea.  Endocrine: Negative for increased urination.  Genitourinary: Negative for difficulty urinating.  Musculoskeletal: Negative for arthralgias, joint pain, joint swelling, myalgias, morning stiffness, muscle tenderness and myalgias.  Skin: Negative for color change, rash and redness.  Allergic/Immunologic: Positive for susceptible to infections.  Neurological: Negative for dizziness, numbness, headaches, memory loss and weakness.  Hematological: Positive for bruising/bleeding tendency.  Psychiatric/Behavioral: Negative for confusion.    PMFS History:  Patient Active Problem List   Diagnosis Date Noted  . Acute bronchitis 11/16/2020  . Rash and nonspecific skin eruption 09/18/2020  . Upper respiratory infection, acute 06/10/2017  . Bilateral hand pain 01/13/2017  . Pain, joint, shoulder, left 01/13/2017  . Pain in left ankle and joints of left foot 01/13/2017  . High risk medication use 01/13/2017  . Nausea 08/09/2016  . Cough 03/09/2016  . Obesity (BMI 30-39.9) 01/22/2016  . Right foot pain 01/21/2016  . Routine general medical examination at a health care facility 12/11/2013  . Visit for screening mammogram 03/07/2012  . Anemia, iron deficiency 02/10/2012  . Sarcoidosis (HCC) 09/17/2008  . Asthma in remission 09/03/2008  . Allergic rhinitis due to pollen 09/03/2008    Past Medical History:  Diagnosis Date  . Abnormal chest x-ray   . Anemia   . Hemoptysis   . Sarcoid    bronch negative 09-2008 ACE  93  . Unspecified asthma(493.90)     Family History  Problem Relation Age of Onset  . Asthma Paternal Grandfather   . Lung cancer Maternal Grandfather   . Hyperlipidemia Mother   . Hypertension Mother   . Cancer Mother        lung   . Diabetes Father   . Hyperlipidemia Maternal Grandmother   . Stroke Maternal Grandmother   .  Cancer Paternal Grandmother   . Alcohol abuse Paternal Grandmother   . Early death Paternal Grandmother   . Healthy Son   . Healthy Daughter    Past Surgical History:  Procedure Laterality Date  . BRONCHOSCOPY  09-2008   Negative  . CESAREAN SECTION    . TUBAL LIGATION     1995   Social History   Social History Narrative   ** Merged History Encounter **       Immunization History  Administered Date(s) Administered  . Influenza Split 06/12/2012  . Influenza Whole 05/04/2011  . Influenza,inj,Quad PF,6+ Mos 07/16/2013, 06/16/2014, 07/03/2018, 05/31/2019, 08/04/2020  . PFIZER(Purple Top)SARS-COV-2 Vaccination 11/27/2019, 12/18/2019, 08/20/2020  . Pneumococcal Polysaccharide-23 05/04/2011  . Tdap 03/07/2012     Objective: Vital Signs: BP 108/73 (BP Location: Left Arm, Patient Position: Sitting, Cuff Size: Normal)   Pulse 87   Resp 13   Ht 5\' 3"  (1.6 m)   Wt 174 lb 12.8 oz (79.3 kg)   BMI 30.96 kg/m    Physical Exam Vitals and nursing note reviewed.  Constitutional:      Appearance: She is well-developed.  HENT:     Head: Normocephalic and atraumatic.  Eyes:     Conjunctiva/sclera: Conjunctivae normal.  Pulmonary:     Effort: Pulmonary effort is normal.  Abdominal:     Palpations: Abdomen is soft.  Musculoskeletal:     Cervical back: Normal range of motion.  Skin:    General: Skin is warm and dry.     Capillary Refill: Capillary refill takes less than 2 seconds.  Neurological:     Mental Status: She is alert and oriented to person, place, and time.  Psychiatric:        Behavior: Behavior normal.      Musculoskeletal Exam: C-spine, thoracic spine, and lumbar spine good ROM.  Shoulder joints, elbow joints, wrist joints, MCPs,  PIPs, and DIPs good ROM with no synovitis.  Complete fist formation bilaterally.  Hip joints, knee joints, and ankle joints good ROM with no discomfort. No tenderness over trochanteric bursa. No warmth of effusion of knee joints.  Left knee  crepitus noted.  No tenderness or swelling of ankle joints. Tenderness over the left 1st MTP joint.    CDAI Exam: CDAI Score: -- Patient Global: --; Provider Global: -- Swollen: --; Tender: -- Joint Exam 12/07/2020   No joint exam has been documented for this visit   There is currently no information documented on the homunculus. Go to the Rheumatology activity and complete the homunculus joint exam.  Investigation: No additional findings.  Imaging: DG Chest 2 View  Result Date: 11/16/2020 CLINICAL DATA:  Sarcoidosis, acute bronchitis. EXAM: CHEST - 2 VIEW COMPARISON:  September 17, 2020. FINDINGS: The heart size and mediastinal contours are within normal limits. No pneumothorax or pleural effusion is noted. Stable reticular densities are noted throughout both lungs most consistent with scarring related or sarcoidosis. The visualized skeletal structures are unremarkable. IMPRESSION: Stable bilateral reticular densities are noted most consistent with scarring related or sarcoidosis. Electronically Signed   By: September 19, 2020  Christen Butter M.D.   On: 11/16/2020 16:12    Recent Labs: Lab Results  Component Value Date   WBC 3.5 (L) 10/12/2020   HGB 11.4 (L) 10/12/2020   PLT 170.0 10/12/2020   NA 138 10/15/2020   K 4.5 10/15/2020   CL 106 10/15/2020   CO2 27 10/15/2020   GLUCOSE 73 10/15/2020   BUN 14 10/15/2020   CREATININE 0.96 10/15/2020   BILITOT 0.3 10/15/2020   ALKPHOS 37 (L) 02/27/2019   AST 19 10/15/2020   ALT 10 10/15/2020   PROT 8.1 10/15/2020   ALBUMIN 3.7 02/27/2019   CALCIUM 9.1 10/15/2020   GFRAA 80 10/15/2020   QFTBGOLDPLUS NEGATIVE 10/15/2020    Speciality Comments: No specialty comments available.  Procedures:  No procedures performed Allergies: Hydrocodone   Assessment / Plan:     Visit Diagnoses: Sarcoidosis (HCC) - Pulmonary sarcoidosis.  Followed by Dr. Maple Hudson, ACE WNL on 10/12/20: She was restarted on methotrexate 6 tablets by mouth once weekly and folic acid 1 mg  by mouth daily after her last office visit on 10/25/2020.  She has been tolerating methotrexate without any side effects.  According to the patient she had to hold methotrexate for about 3 weeks in March due to developing bronchitis.  She completed a Z-Pak as well as a prednisone taper and her symptoms have resolved.  According to the patient she took Phenergan with codeine cough syrup which helped to resolve her cough.  She had a CXR on 11/16/20 which revealed stable bilateral reticular densities.  She has not had any other infection since starting on methotrexate. She has an upcoming appointment on 02/02/21 with Dr. Maple Hudson. Overall her energy level has improved.  She has started walking on a regular basis for exercise.  She has no signs of inflammatory arthritis at this time.  No joint tenderness or synovitis was noted.  She has not had any recent rashes.  No EN noted.  She will continue on Methotrexate 6 tablets by mouth once weekly and folic acid 1 mg by mouth daily. She will follow up in 3 months.    High risk medication use - Previously on MTX but d/c in December 2018.  Restarted on MTX after her last visit on 10/15/20. She is currently taking methotrexate 6 tablets by mouth once weekly and folic acid 1 mg by mouth daily.  CBC and CMP were drawn on 10/15/2020.  She is overdue to update lab work.  Orders for CBC and CMP were released.  She will return later today or sometime this week to update lab work.- Plan: CBC with Differential/Platelet, COMPLETE METABOLIC PANEL WITH GFR She was recently treated with a zpak and prednisone for acute bronchitis.  She held MTX for 3 weeks.  Discussed the importance of holding methotrexate if she develops signs or symptoms of infection and to resume once infection has completely cleared.  Shortness of breath: Improved.  She has a follow up appointment with Dr. Maple Hudson on 02/02/21.  Primary osteoarthritis of both hands: X-rays showed mild osteoarthritis of both hands on 10/15/20.   She has no joint tenderness or inflammation on exam.  Complete fist formation bilaterally.  Joint protection and muscle strengthening were discussed.   Acute pain of both knees: She has good ROM of both knee joints with no discomfort. Left knee crepitus noted.  No warmth or effusion noted.   Pain in both feet: She has tenderness to palpation over the left 1st MTP joint but no inflammation was noted.  Discussed the importance of wearing proper fitting shoes.   Bunion of great toe of left foot: She has tenderness over the left 1st MTP joint.  Abnormal laboratory test - Elevated IgG. Patient has been followed by Dr. Clelia CroftShadad.   History of anemia: Order for CBC placed today.   Other fatigue: Her energy level has started to improve.  She has started walking on a regular basis for exercise.   Orders: Orders Placed This Encounter  Procedures  . CBC with Differential/Platelet  . COMPLETE METABOLIC PANEL WITH GFR   No orders of the defined types were placed in this encounter.   Follow-Up Instructions: Return in about 3 months (around 03/08/2021) for Sarcoidosis, Osteoarthritis.   Gearldine Bienenstockaylor M Damar Petit, PA-C  Note - This record has been created using Dragon software.  Chart creation errors have been sought, but may not always  have been located. Such creation errors do not reflect on  the standard of medical care.

## 2020-12-02 ENCOUNTER — Ambulatory Visit: Payer: BC Managed Care – PPO | Admitting: Rheumatology

## 2020-12-02 DIAGNOSIS — R5383 Other fatigue: Secondary | ICD-10-CM

## 2020-12-02 DIAGNOSIS — M21611 Bunion of right foot: Secondary | ICD-10-CM

## 2020-12-02 DIAGNOSIS — R899 Unspecified abnormal finding in specimens from other organs, systems and tissues: Secondary | ICD-10-CM

## 2020-12-02 DIAGNOSIS — R0602 Shortness of breath: Secondary | ICD-10-CM

## 2020-12-02 DIAGNOSIS — M79671 Pain in right foot: Secondary | ICD-10-CM

## 2020-12-02 DIAGNOSIS — M25561 Pain in right knee: Secondary | ICD-10-CM

## 2020-12-02 DIAGNOSIS — Z79899 Other long term (current) drug therapy: Secondary | ICD-10-CM

## 2020-12-02 DIAGNOSIS — M79641 Pain in right hand: Secondary | ICD-10-CM

## 2020-12-02 DIAGNOSIS — D869 Sarcoidosis, unspecified: Secondary | ICD-10-CM

## 2020-12-02 DIAGNOSIS — Z862 Personal history of diseases of the blood and blood-forming organs and certain disorders involving the immune mechanism: Secondary | ICD-10-CM

## 2020-12-07 ENCOUNTER — Ambulatory Visit (INDEPENDENT_AMBULATORY_CARE_PROVIDER_SITE_OTHER): Payer: BC Managed Care – PPO | Admitting: Physician Assistant

## 2020-12-07 ENCOUNTER — Encounter: Payer: Self-pay | Admitting: Physician Assistant

## 2020-12-07 ENCOUNTER — Other Ambulatory Visit: Payer: Self-pay

## 2020-12-07 VITALS — BP 108/73 | HR 87 | Resp 13 | Ht 63.0 in | Wt 174.8 lb

## 2020-12-07 DIAGNOSIS — Z79899 Other long term (current) drug therapy: Secondary | ICD-10-CM | POA: Diagnosis not present

## 2020-12-07 DIAGNOSIS — R5383 Other fatigue: Secondary | ICD-10-CM

## 2020-12-07 DIAGNOSIS — M25561 Pain in right knee: Secondary | ICD-10-CM

## 2020-12-07 DIAGNOSIS — M19041 Primary osteoarthritis, right hand: Secondary | ICD-10-CM | POA: Diagnosis not present

## 2020-12-07 DIAGNOSIS — R0602 Shortness of breath: Secondary | ICD-10-CM

## 2020-12-07 DIAGNOSIS — M21611 Bunion of right foot: Secondary | ICD-10-CM

## 2020-12-07 DIAGNOSIS — M79672 Pain in left foot: Secondary | ICD-10-CM

## 2020-12-07 DIAGNOSIS — D869 Sarcoidosis, unspecified: Secondary | ICD-10-CM | POA: Diagnosis not present

## 2020-12-07 DIAGNOSIS — M79671 Pain in right foot: Secondary | ICD-10-CM

## 2020-12-07 DIAGNOSIS — M25562 Pain in left knee: Secondary | ICD-10-CM

## 2020-12-07 DIAGNOSIS — Z862 Personal history of diseases of the blood and blood-forming organs and certain disorders involving the immune mechanism: Secondary | ICD-10-CM

## 2020-12-07 DIAGNOSIS — M21612 Bunion of left foot: Secondary | ICD-10-CM

## 2020-12-07 DIAGNOSIS — R899 Unspecified abnormal finding in specimens from other organs, systems and tissues: Secondary | ICD-10-CM

## 2020-12-07 DIAGNOSIS — M19042 Primary osteoarthritis, left hand: Secondary | ICD-10-CM

## 2021-01-08 ENCOUNTER — Encounter: Payer: Self-pay | Admitting: Family

## 2021-01-13 LAB — COMPREHENSIVE METABOLIC PANEL
Albumin: 3.9 (ref 3.5–5.0)
Calcium: 8.8 (ref 8.7–10.7)
Globulin: 3.7

## 2021-01-13 LAB — CBC: RBC: 3.78 — AB (ref 3.87–5.11)

## 2021-01-13 LAB — LIPID PANEL
Cholesterol: 216 — AB (ref 0–200)
HDL: 92 — AB (ref 35–70)
LDL Cholesterol: 113
LDl/HDL Ratio: 1.2
Triglycerides: 65 (ref 40–160)

## 2021-01-13 LAB — BASIC METABOLIC PANEL
BUN: 12 (ref 4–21)
Chloride: 104 (ref 99–108)
Creatinine: 0.7 (ref <=1.1)
Glucose: 88

## 2021-01-13 LAB — TSH: TSH: 1.78 (ref 0.41–5.90)

## 2021-01-13 LAB — CBC AND DIFFERENTIAL
HCT: 31 — AB (ref 36–46)
Hemoglobin: 9.8 — AB (ref 12.0–16.0)
WBC: 3.3

## 2021-01-14 ENCOUNTER — Telehealth: Payer: Self-pay

## 2021-01-14 MED ORDER — POLYSACCHARIDE IRON COMPLEX 150 MG PO CAPS: 150.0000 mg | ORAL_CAPSULE | Freq: Every day | ORAL | 1 refills | Status: DC

## 2021-01-14 NOTE — Telephone Encounter (Signed)
Let me try sending in an alternative for her called Niferex; has she ever seen hematologist to discuss IV iron infusions?

## 2021-01-14 NOTE — Telephone Encounter (Signed)
I have given the pt a call back to relay the provider message. There was no answer so I left a message to call back.

## 2021-01-14 NOTE — Telephone Encounter (Signed)
Is there an alternative to the Tandem medication?

## 2021-01-14 NOTE — Telephone Encounter (Signed)
Pt called to scheduled cpe- states she had blood work through work and Hgb was 9. She normally takes Tandem for her iron but her pharmacy told her they no longer have it. She is wondering what she can take in place of that.   Telephone: [phone]

## 2021-01-18 NOTE — Telephone Encounter (Signed)
I have attempted to call pt to relay provider message below. There was no answer so I left another message to call back.

## 2021-02-02 ENCOUNTER — Ambulatory Visit: Payer: BC Managed Care – PPO | Admitting: Internal Medicine

## 2021-02-11 ENCOUNTER — Other Ambulatory Visit: Payer: Self-pay | Admitting: Family

## 2021-02-11 ENCOUNTER — Encounter: Payer: Self-pay | Admitting: Family

## 2021-02-11 ENCOUNTER — Ambulatory Visit (INDEPENDENT_AMBULATORY_CARE_PROVIDER_SITE_OTHER): Payer: BC Managed Care – PPO | Admitting: Family

## 2021-02-11 ENCOUNTER — Other Ambulatory Visit: Payer: Self-pay

## 2021-02-11 VITALS — BP 122/80 | HR 78 | Temp 98.0°F | Resp 16 | Ht 63.0 in | Wt 166.0 lb

## 2021-02-11 DIAGNOSIS — D509 Iron deficiency anemia, unspecified: Secondary | ICD-10-CM

## 2021-02-11 DIAGNOSIS — D869 Sarcoidosis, unspecified: Secondary | ICD-10-CM | POA: Diagnosis not present

## 2021-02-11 DIAGNOSIS — R790 Abnormal level of blood mineral: Secondary | ICD-10-CM

## 2021-02-11 LAB — CBC WITH DIFFERENTIAL/PLATELET
Basophils Absolute: 0 10*3/uL (ref 0.0–0.1)
Basophils Relative: 0.5 % (ref 0.0–3.0)
Eosinophils Absolute: 0.1 10*3/uL (ref 0.0–0.7)
Eosinophils Relative: 2 % (ref 0.0–5.0)
HCT: 35.8 % — ABNORMAL LOW (ref 36.0–46.0)
Hemoglobin: 11.3 g/dL — ABNORMAL LOW (ref 12.0–15.0)
Lymphocytes Relative: 49.3 % — ABNORMAL HIGH (ref 12.0–46.0)
Lymphs Abs: 1.5 10*3/uL (ref 0.7–4.0)
MCHC: 31.6 g/dL (ref 30.0–36.0)
MCV: 80.5 fl (ref 78.0–100.0)
Monocytes Absolute: 0.4 10*3/uL (ref 0.1–1.0)
Monocytes Relative: 11.9 % (ref 3.0–12.0)
Neutro Abs: 1.1 10*3/uL — ABNORMAL LOW (ref 1.4–7.7)
Neutrophils Relative %: 36.3 % — ABNORMAL LOW (ref 43.0–77.0)
Platelets: 158 10*3/uL (ref 150.0–400.0)
RBC: 4.45 Mil/uL (ref 3.87–5.11)
RDW: 14.4 % (ref 11.5–15.5)
WBC: 3 10*3/uL — ABNORMAL LOW (ref 4.0–10.5)

## 2021-02-11 LAB — FERRITIN: Ferritin: 5.1 ng/mL — ABNORMAL LOW (ref 10.0–291.0)

## 2021-02-11 NOTE — Progress Notes (Signed)
Patient ID: Nancy Villarreal. Nancy Villarreal, female    DOB: 01/28/1971, 50 y.o.   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 Complicated by Iron def anemia, plasma cell disorder. Bronch NEG 09/24/08- dx based on mediastinal adenopathy/ interstitial disease, ACE 90, response to steroids. PFT 10/11/16- ACE 09/30/16- 86H  ACE 12/07/17- 97, up from 89 in 2018 ACE 01/08/19- 67 WNL ACE 08/04/20- 80H Immunoglobulins 12/19/16- IgG 2,669 ---------------------------------------------------------------------------------------------------------   09/17/20- 49 yoF former smoker followed here with Sarcoid Stage III,  Bronch NEG 09/24/08- dx based on mediastinal adenopathy/ interstitial disease, chronic elevation of ACE , response to steroid. Hemoptysis, Complicated by Iron def anemia, plasma cell disorder Albuterol hfa Short term Disability  Covid vax- 3 Phizer Flu vax- had Lab- ACE 80 H- 08/04/20 Took prednisone 10 daily 12/3- 09/17/20 LOA ends with RTW Feb 1. She feels ok now, "nothing physical, just fatigue". No longer coughing. Brings RTW certification form for me.  Has noted new rash along collar line on chest. CXR 08/04/20- IMPRESSION: 1. 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  02/12/21- 50yoF former smoker followed here with Sarcoid Stage III ( Bronch NEG 09/24/08- dx based on mediastinal adenopathy/ interstitial disease, chronic elevation of ACE, response to steroid), Hemoptysis, Bronchitis, Complicated by Iron def anemia, plasma cell disorder -Albuterol hfa, MTX per Rheumatology for dx osteoarthritis.  Short term Disability -status? Covid vax- 3 Phizer Saw NP 3/14 for acute bronchitis> Zpak, prednisone taper, Mucinex,  Getting married in August. No cough or wheeze- not needing her albuterol inhaler.  Getting iron infusion for anemia. CXR 11/16/20- IMPRESSION: Stable bilateral reticular densities are noted most consistent with scarring related or sarcoidosis.  Review of Systems-see HPI   + = positive HEENT:   No headaches,  Difficulty swallowing,  Tooth/dental problems,  Sore throat,                No sneezing, itching, ear ache, nasal congestion, post nasal drip,  CV:  No- chest pain,  No-PND, swelling in lower extremities, anasarca, dizziness, palpitations GI  No heartburn, indigestion, abdominal pain, nausea, vomiting,  Resp:  No acute shortness of breath with exertion .  No excess mucus, no productive cough,                  non-productive cough,  No- recent coughing up of blood.  No change in color of mucus.  No wheezing.  Skin: no rash or lesions. GU: MS: joint pain .  No decreased range of motion.  No back pain. Psych:  No change in mood or affect. No depression or anxiety.  No memory loss.  Objective:   Physical Exam General- Alert, Oriented, Affect-appropriate, Distress- none acute.  Skin-   no obvious rash Lymphadenopathy- none Head- atraumatic            Eyes- Gross vision intact, PERRLA, conjunctivae clear secretions            Ears- Hearing, canals normal            Nose- Clear, No-Septal dev, mucus, polyps, erosion, perforation             Throat- Mallampati II , mucosa-clear , drainage- none, tonsils present.                     Neck- flexible , trachea midline, no stridor ,  thyroid nl, carotid no bruit Chest - symmetrical excursion , unlabored           Heart/CV- RRR , no murmur , no gallop  , no rub, nl s1 s2                           - JVD- none , edema- none, stasis changes- none, varices- none           Lung-   Cough-none, +trace squeaks, unlabored, wheeze- none,  dullness-none, rub- none           Chest wall-  Abd-  Br/ Gen/ Rectal- Not done, not indicated Extrem- cyanosis- none, clubbing, none, atrophy- none, strength- nl Neuro- grossly intact to  observation

## 2021-02-11 NOTE — Progress Notes (Signed)
Nancy Villarreal is a 50 y.o. female with the following history as recorded in Epic[employer]:  Patient Active Problem List   Diagnosis Date Noted   Acute bronchitis 11/16/2020   Rash and nonspecific skin eruption 09/18/2020   Upper respiratory infection, acute 06/10/2017   Bilateral hand pain 01/13/2017   Pain, joint, shoulder, left 01/13/2017   Pain in left ankle and joints of left foot 01/13/2017   High risk medication use 01/13/2017   Nausea 08/09/2016   Cough 03/09/2016   Obesity (BMI 30-39.9) 01/22/2016   Right foot pain 01/21/2016   Routine general medical examination at a health care facility 12/11/2013   Visit for screening mammogram 03/07/2012   Anemia, iron deficiency 02/10/2012   Sarcoidosis (HCC) 09/17/2008   Asthma in remission 09/03/2008   Allergic rhinitis due to pollen 09/03/2008    Current Outpatient Medications  Medication Sig Dispense Refill   albuterol (VENTOLIN HFA) 108 (90 Base) MCG/ACT inhaler TAKE 2 PUFFS BY MOUTH EVERY 6 HOURS AS NEEDED FOR WHEEZE OR SHORTNESS OF BREATH 6.7 g 1   folic acid (FOLVITE) 1 MG tablet Take 1 tablet (1 mg total) by mouth daily. 90 tablet 3   iron polysaccharides (NIFEREX) 150 MG capsule Take 1 capsule (150 mg total) by mouth daily. 90 capsule 1   methotrexate (RHEUMATREX) 2.5 MG tablet Take 6 tablets (15 mg total) by mouth once a week. Caution:Chemotherapy. Protect from light. 24 tablet 2   venlafaxine XR (EFFEXOR XR) 75 MG 24 hr capsule Take 1 tablet daily x 1 week; 90 capsule 1   No current facility-administered medications for this visit.    Allergies: Hydrocodone  Past Medical History:  Diagnosis Date   Abnormal chest x-ray    Anemia    Hemoptysis    Sarcoid    bronch negative 09-2008 ACE 93   Unspecified asthma(493.90)     Past Surgical History:  Procedure Laterality Date   BRONCHOSCOPY  09-2008   Negative   CESAREAN SECTION     TUBAL LIGATION     1995    Family History  Problem Relation Age of Onset   Asthma  Paternal Grandfather    Lung cancer Maternal Grandfather    Hyperlipidemia Mother    Hypertension Mother    Cancer Mother        lung    Diabetes Father    Hyperlipidemia Maternal Grandmother    Stroke Maternal Grandmother    Cancer Paternal Grandmother    Alcohol abuse Paternal Grandmother    Early death Paternal Grandmother    Healthy Son    Healthy Daughter     Social History   Tobacco Use   Smoking status: Former    Packs/day: 0.25    Years: 10.00    Pack years: 2.50    Types: Cigarettes    Start date: 2001    Quit date: 08/05/2008    Years since quitting: 12.5   Smokeless tobacco: Never  Substance Use Topics   Alcohol use: Yes    Subjective:   Known history of iron deficiency anemia and chronic anemia due to sarcoidosis; had labs done in February and Hgb stable at 11.4; however, by May, she had CPE for her employer and hgb down to 9; does have regular periods and notes that bleeding can be heavy; history of uterine fibroids; denies any rectal bleeding- notes that stool can look dark due to oral iron supplement; is responding well to Niferex;   Objective:  Vitals:   02/11/21  0938  BP: 122/80  Pulse: 78  Resp: 16  Temp: 98 F (36.7 C)  SpO2: 98%  Weight: 166 lb (75.3 kg)  Height: 5\' 3"  (1.6 m)    General: Well developed, well nourished, in no acute distress  Skin : Warm and dry.  Head: Normocephalic and atraumatic  Eyes: Sclera and conjunctiva clear; pupils round and reactive to light; extraocular movements intact  Ears: External normal; canals clear; tympanic membranes normal  Oropharynx: Pink, supple. No suspicious lesions  Neck: Supple without thyromegaly, adenopathy  Lungs: Respirations unlabored; clear to auscultation bilaterally without wheeze, rales, rhonchi  CVS exam: normal rate and regular rhythm.  Neurologic: Alert and oriented; speech intact; face symmetrical; moves all extremities well; CNII-XII intact without focal deficit   Assessment:  1.  Iron deficiency anemia, unspecified iron deficiency anemia type   2. Sarcoidosis (HCC)     Plan:  Check CBC, ferritin/ tibc; may need to refer for iron infusion; continue Niferex now; refer to GYN to discuss periods- ? Candidate for uterine ablation and patient is hoping to stop her periods before her upcoming honeymoon in August; Cologuard was just done and was normal- if symptoms persist and no GYN source found, will need to refer to GI.  Continue with pulmonology as scheduled;  This visit occurred during the SARS-CoV-2 public health emergency.  Safety protocols were in place, including screening questions prior to the visit, additional usage of staff PPE, and extensive cleaning of exam room while observing appropriate contact time as indicated for disinfecting solutions.   Time spent 30 minutes discussing treatment options/ care plan  No follow-ups on file.  Orders Placed This Encounter  Procedures   CBC with Differential/Platelet   Ferritin   Iron and TIBC   Ambulatory referral to Gynecology    Referral Priority:   Urgent    Referral Type:   Consultation    Referral Reason:   Specialty Services Required    Requested Specialty:   Gynecology    Number of Visits Requested:   1    Requested Prescriptions    No prescriptions requested or ordered in this encounter

## 2021-02-12 ENCOUNTER — Encounter: Payer: Self-pay | Admitting: Family

## 2021-02-12 ENCOUNTER — Encounter: Payer: Self-pay | Admitting: Internal Medicine

## 2021-02-12 ENCOUNTER — Ambulatory Visit (INDEPENDENT_AMBULATORY_CARE_PROVIDER_SITE_OTHER): Payer: BC Managed Care – PPO | Admitting: Internal Medicine

## 2021-02-12 ENCOUNTER — Telehealth: Payer: Self-pay | Admitting: Oncology

## 2021-02-12 DIAGNOSIS — D869 Sarcoidosis, unspecified: Secondary | ICD-10-CM | POA: Diagnosis not present

## 2021-02-12 DIAGNOSIS — D508 Other iron deficiency anemias: Secondary | ICD-10-CM | POA: Diagnosis not present

## 2021-02-12 LAB — IRON AND TIBC
Iron Saturation: 39 % (ref 15–55)
Iron: 136 ug/dL (ref 27–159)
Total Iron Binding Capacity: 353 ug/dL (ref 250–450)
UIBC: 217 ug/dL (ref 131–425)

## 2021-02-12 NOTE — Patient Instructions (Signed)
We will leave your inhaler on your medication list. Ok to use it if you need it.  Anemia can make you feel weak and short of breath, so I'm glad that is being addressed.  Please call if we can help

## 2021-02-12 NOTE — Telephone Encounter (Signed)
Scheduled appt per 6/9 referral. Pt aware.  

## 2021-02-12 NOTE — Assessment & Plan Note (Signed)
Clinically stable, MTX she says is for osteoarthritis, but would likely help with any sarcoid activity Plan- observation for now.

## 2021-02-12 NOTE — Assessment & Plan Note (Signed)
Discussed impact of anemia on weakness, dyspnea and supported her effort to get this resolved. Not having hemoptysis.

## 2021-02-17 ENCOUNTER — Encounter: Payer: Self-pay | Admitting: Family

## 2021-02-17 LAB — URIC ACID: Uric Acid: 4.4

## 2021-02-17 NOTE — Progress Notes (Signed)
Abstracted some labs from labs not drawn here.

## 2021-02-19 ENCOUNTER — Other Ambulatory Visit: Payer: Self-pay | Admitting: Family

## 2021-02-19 ENCOUNTER — Encounter: Payer: Self-pay | Admitting: Family

## 2021-02-19 MED ORDER — PREDNISONE 20 MG PO TABS: 40.0000 mg | ORAL_TABLET | Freq: Every day | ORAL | 0 refills | Status: DC

## 2021-02-19 MED ORDER — AZITHROMYCIN 250 MG PO TABS: ORAL_TABLET | ORAL | 0 refills | Status: DC

## 2021-03-09 DIAGNOSIS — Z01419 Encounter for gynecological examination (general) (routine) without abnormal findings: Secondary | ICD-10-CM | POA: Diagnosis not present

## 2021-03-09 DIAGNOSIS — Z6830 Body mass index (BMI) 30.0-30.9, adult: Secondary | ICD-10-CM | POA: Diagnosis not present

## 2021-03-10 DIAGNOSIS — Z01419 Encounter for gynecological examination (general) (routine) without abnormal findings: Secondary | ICD-10-CM | POA: Diagnosis not present

## 2021-03-10 LAB — RESULTS CONSOLE HPV: CHL HPV: NEGATIVE

## 2021-03-17 ENCOUNTER — Telehealth: Payer: Self-pay | Admitting: *Deleted

## 2021-03-17 ENCOUNTER — Telehealth: Payer: Self-pay | Admitting: Oncology

## 2021-03-17 ENCOUNTER — Inpatient Hospital Stay: Payer: BC Managed Care – PPO | Admitting: Oncology

## 2021-03-17 LAB — HM PAP SMEAR: HPV, high-risk: NEGATIVE

## 2021-03-17 NOTE — Telephone Encounter (Signed)
PC to patient regarding missed appointment this morning, patient states she wants to reschedule, states she called twice but never received a call back.  Scheduling message sent.

## 2021-03-17 NOTE — Telephone Encounter (Signed)
Scheduled appointment per 07/12 sch msg. Patient is aware. 

## 2021-03-18 ENCOUNTER — Ambulatory Visit: Payer: BC Managed Care – PPO | Admitting: Internal Medicine

## 2021-04-09 DIAGNOSIS — Z20822 Contact with and (suspected) exposure to covid-19: Secondary | ICD-10-CM | POA: Diagnosis not present

## 2021-04-11 ENCOUNTER — Other Ambulatory Visit: Payer: Self-pay | Admitting: Family

## 2021-04-22 ENCOUNTER — Inpatient Hospital Stay: Payer: BC Managed Care – PPO

## 2021-04-22 ENCOUNTER — Inpatient Hospital Stay: Payer: BC Managed Care – PPO | Attending: Oncology | Admitting: Oncology

## 2021-04-22 ENCOUNTER — Other Ambulatory Visit: Payer: Self-pay

## 2021-04-22 VITALS — BP 126/86 | HR 104 | Temp 98.2°F | Resp 20 | Ht 63.0 in | Wt 174.0 lb

## 2021-04-22 DIAGNOSIS — R0609 Other forms of dyspnea: Secondary | ICD-10-CM | POA: Diagnosis not present

## 2021-04-22 DIAGNOSIS — D890 Polyclonal hypergammaglobulinemia: Secondary | ICD-10-CM | POA: Diagnosis not present

## 2021-04-22 DIAGNOSIS — D509 Iron deficiency anemia, unspecified: Secondary | ICD-10-CM | POA: Diagnosis not present

## 2021-04-22 DIAGNOSIS — D869 Sarcoidosis, unspecified: Secondary | ICD-10-CM | POA: Insufficient documentation

## 2021-04-22 DIAGNOSIS — Z7952 Long term (current) use of systemic steroids: Secondary | ICD-10-CM | POA: Diagnosis not present

## 2021-04-22 DIAGNOSIS — Z79899 Other long term (current) drug therapy: Secondary | ICD-10-CM | POA: Diagnosis not present

## 2021-04-22 DIAGNOSIS — D709 Neutropenia, unspecified: Secondary | ICD-10-CM | POA: Insufficient documentation

## 2021-04-22 DIAGNOSIS — R5383 Other fatigue: Secondary | ICD-10-CM | POA: Insufficient documentation

## 2021-04-22 DIAGNOSIS — Z885 Allergy status to narcotic agent status: Secondary | ICD-10-CM | POA: Insufficient documentation

## 2021-04-22 LAB — CBC WITH DIFFERENTIAL (CANCER CENTER ONLY)
Abs Immature Granulocytes: 0.01 10*3/uL (ref 0.00–0.07)
Basophils Absolute: 0 10*3/uL (ref 0.0–0.1)
Basophils Relative: 0 %
Eosinophils Absolute: 0.1 10*3/uL (ref 0.0–0.5)
Eosinophils Relative: 1 %
HCT: 34.1 % — ABNORMAL LOW (ref 36.0–46.0)
Hemoglobin: 10.6 g/dL — ABNORMAL LOW (ref 12.0–15.0)
Immature Granulocytes: 0 %
Lymphocytes Relative: 31 %
Lymphs Abs: 1.6 10*3/uL (ref 0.7–4.0)
MCH: 25.4 pg — ABNORMAL LOW (ref 26.0–34.0)
MCHC: 31.1 g/dL (ref 30.0–36.0)
MCV: 81.8 fL (ref 80.0–100.0)
Monocytes Absolute: 0.5 10*3/uL (ref 0.1–1.0)
Monocytes Relative: 9 %
Neutro Abs: 3.1 10*3/uL (ref 1.7–7.7)
Neutrophils Relative %: 59 %
Platelet Count: 209 10*3/uL (ref 150–400)
RBC: 4.17 MIL/uL (ref 3.87–5.11)
RDW: 14.8 % (ref 11.5–15.5)
WBC Count: 5.3 10*3/uL (ref 4.0–10.5)
nRBC: 0 % (ref 0.0–0.2)

## 2021-04-22 LAB — IRON AND TIBC
Iron: 48 ug/dL (ref 41–142)
Saturation Ratios: 11 % — ABNORMAL LOW (ref 21–57)
TIBC: 433 ug/dL (ref 236–444)
UIBC: 385 ug/dL — ABNORMAL HIGH (ref 120–384)

## 2021-04-22 LAB — FERRITIN: Ferritin: 16 ng/mL (ref 11–307)

## 2021-04-22 NOTE — Progress Notes (Signed)
Hematology and Oncology Follow Up Visit  Nancy Villarreal [id number] 1971-07-01 50 y.o. 04/22/2021 1:18 PM Nancy Villarreal, FNPMurray, Nancy Villarreal,*   Principle Diagnosis: 50 year old woman with iron deficiency anemia noted in May of 2022.  She was found to have hemoglobin of 9.8.  Secondary diagnosis: Sarcoidosis.  Currently on methotrexate and prednisone as needed.  Current therapy: Oral iron therapy.  Interim History: Ms. Nancy Villarreal returns today for a follow-up visit.  Since her last visit, she has reported more fatigue and tiredness and found to have a hemoglobin of 9.8.  She was started on iron replacement therapy and repeat testing on June 9 showed a hemoglobin of 11.3.  Her iron level was 136 with ferritin of 5.1.  Clinically, she reports some fatigue and tiredness still and occasional dyspnea on exertion.  She denies any hematochezia or melena.  She still has menstrual cycles but they are not considerably heavier than normal.   Medications: Updated on review. Current Outpatient Medications  Medication Sig Dispense Refill   albuterol (VENTOLIN HFA) 108 (90 Base) MCG/ACT inhaler TAKE 2 PUFFS BY MOUTH EVERY 6 HOURS AS NEEDED FOR WHEEZE OR SHORTNESS OF BREATH 6.7 g 1   azithromycin (ZITHROMAX) 250 MG tablet 2 tabs po qd x 1 day; 1 tablet per day x 4 days; 6 tablet 0   folic acid (FOLVITE) 1 MG tablet Take 1 tablet (1 mg total) by mouth daily. 90 tablet 3   iron polysaccharides (NIFEREX) 150 MG capsule Take 1 capsule (150 mg total) by mouth daily. 90 capsule 1   methotrexate (RHEUMATREX) 2.5 MG tablet Take 6 tablets (15 mg total) by mouth once a week. Caution:Chemotherapy. Protect from light. 24 tablet 2   predniSONE (DELTASONE) 20 MG tablet Take 2 tablets (40 mg total) by mouth daily with breakfast. 10 tablet 0   venlafaxine XR (EFFEXOR-XR) 75 MG 24 hr capsule TAKE 1 CAPSULE BY MOUTH DAILY FOR 1 WEEK THEN AS DIRECTED 90 capsule 1   No current facility-administered medications for  this visit.     Allergies:  Allergies  Allergen Reactions   Hydrocodone Nausea And Vomiting      Physical Exam: Blood pressure 126/86, pulse (!) 104, temperature 98.2 F (36.8 C), temperature source Oral, resp. rate 20, height 5\' 3"  (1.6 m), weight 174 lb (78.9 kg), SpO2 100 %.  ECOG: 0   General appearance: Comfortable appearing without any discomfort Head: Normocephalic without any trauma Oropharynx: Mucous membranes are moist and pink without any thrush or ulcers. Eyes: Pupils are equal and round reactive to light. Lymph nodes: No cervical, supraclavicular, inguinal or axillary lymphadenopathy.   Heart:regular rate and rhythm.  S1 and S2 without leg edema. Lung: Clear without any rhonchi or wheezes.  No dullness to percussion. Abdomin: Soft, nontender, nondistended with good bowel sounds.  No hepatosplenomegaly. Musculoskeletal: No joint deformity or effusion.  Full range of motion noted. Neurological: No deficits noted on motor, sensory and deep tendon reflex exam. Skin: No petechial rash or dryness.  Appeared moist.  Psychiatric: Mood and affect appeared appropriate.    Lab Results: Lab Results  Component Value Date   WBC 3.9 06/26/2017   HGB 11.9 06/26/2017   HCT 36.0 06/26/2017   MCV 85.5 06/26/2017   PLT 167 06/26/2017     Chemistry      Component Value Date/Time   NA 138 10/15/2020 1333   NA 136 10/06/2016 0840   K 4.5 10/15/2020 1333   CL 104 01/13/2021 0000   CO2  27 10/15/2020 1333   BUN 12 01/13/2021 0000   CREATININE 0.7 01/13/2021 0000   CREATININE 0.96 10/15/2020 1333   GLU 88 01/13/2021 0000      Component Value Date/Time   CALCIUM 8.8 01/13/2021 0000   ALKPHOS 37 (L) 02/27/2019 1033   AST 19 10/15/2020 1333   ALT 10 10/15/2020 1333   BILITOT 0.3 10/15/2020 1333      Impression and Plan:   50 year old woman with:    1.  Iron deficiency anemia: Noted and Jan 13, 2021 with a hemoglobin of 9.8.  She started on oral iron therapy at  that time and has been on it since that time.  Laboratory data on February 11, 2021 showed a hemoglobin 11.3, iron level 136 and ferritin of 5.1.  The differential diagnosis and management options were discussed at this time.  Chronic losses due to menstrual bleeding is likely etiology at this time.  She is up-to-date on her colon cancer screening with Cologuard testing.  Management options were reviewed at this time.  Continuing oral iron replacement versus intravenous iron infusion were discussed.  Risks and benefits of both approaches were reviewed.  She has tolerated oral iron therapy well without any major complications and reasonable improvement in her counts.  After discussion today, we decided to update her iron studies today and determine best course of action accordingly.  Her iron continues to improve on oral replacement intravenous iron is not needed.    2.  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  3.  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  Her neutropenia remains close to baseline.  4.  Follow-up: will be in 6 months for repeat testing.  This will be done sooner if needed 2.  30  minutes were dedicated to this encounter.  Time spent on reviewing laboratory data, disease status update and outlining future plan of care.   Eli Hose, MD 8/18/20221:18 PM

## 2021-04-23 ENCOUNTER — Telehealth: Payer: Self-pay

## 2021-04-23 NOTE — Telephone Encounter (Signed)
Called patient to try and inform her of her lab results. I received her voicemail and left a detailed message per DPR informing her of results and Dr. Alver Fisher recommendation. I encouraged the patient to call back with any questions or concerns.

## 2021-04-23 NOTE — Telephone Encounter (Signed)
-----   Message from Benjiman Core, MD sent at 04/23/2021  8:37 AM EDT ----- Please let her know her iron is improved including ferritin.  She does not require intravenous iron at this time.

## 2021-04-27 ENCOUNTER — Ambulatory Visit (INDEPENDENT_AMBULATORY_CARE_PROVIDER_SITE_OTHER): Payer: BC Managed Care – PPO | Admitting: Family

## 2021-04-27 ENCOUNTER — Other Ambulatory Visit: Payer: Self-pay

## 2021-04-27 ENCOUNTER — Encounter: Payer: Self-pay | Admitting: Family

## 2021-04-27 VITALS — BP 112/80 | HR 104 | Temp 98.3°F | Ht 63.0 in | Wt 172.2 lb

## 2021-04-27 DIAGNOSIS — D869 Sarcoidosis, unspecified: Secondary | ICD-10-CM | POA: Diagnosis not present

## 2021-04-27 DIAGNOSIS — Z6830 Body mass index (BMI) 30.0-30.9, adult: Secondary | ICD-10-CM

## 2021-04-27 DIAGNOSIS — Z0000 Encounter for general adult medical examination without abnormal findings: Secondary | ICD-10-CM

## 2021-04-27 DIAGNOSIS — Z1231 Encounter for screening mammogram for malignant neoplasm of breast: Secondary | ICD-10-CM

## 2021-04-27 NOTE — Progress Notes (Signed)
Nancy Villarreal is a 50 y.o. female with the following history as recorded in EpicCare:  Patient Active Problem List   Diagnosis Date Noted   Acute bronchitis 11/16/2020   Rash and nonspecific skin eruption 09/18/2020   Upper respiratory infection, acute 06/10/2017   Bilateral hand pain 01/13/2017   Pain, joint, shoulder, left 01/13/2017   Pain in left ankle and joints of left foot 01/13/2017   High risk medication use 01/13/2017   Nausea 08/09/2016   Cough 03/09/2016   Obesity (BMI 30-39.9) 01/22/2016   Right foot pain 01/21/2016   Routine general medical examination at a health care facility 12/11/2013   Visit for screening mammogram 03/07/2012   Anemia, iron deficiency 02/10/2012   Sarcoidosis (HCC) 09/17/2008   Asthma in remission 09/03/2008   Allergic rhinitis due to pollen 09/03/2008    Current Outpatient Medications  Medication Sig Dispense Refill   iron polysaccharides (NIFEREX) 150 MG capsule Take 1 capsule (150 mg total) by mouth daily. 90 capsule 1   JUNEL FE 1.5/30 1.5-30 MG-MCG tablet Take 1 tablet by mouth daily.     venlafaxine XR (EFFEXOR-XR) 75 MG 24 hr capsule TAKE 1 CAPSULE BY MOUTH DAILY FOR 1 WEEK THEN AS DIRECTED 90 capsule 1   No current facility-administered medications for this visit.    Allergies: Hydrocodone  Past Medical History:  Diagnosis Date   Abnormal chest x-ray    Anemia    Hemoptysis    Sarcoid    bronch negative 09-2008 ACE 93   Unspecified asthma(493.90)     Past Surgical History:  Procedure Laterality Date   BRONCHOSCOPY  09-2008   Negative   CESAREAN SECTION     TUBAL LIGATION     1995    Family History  Problem Relation Age of Onset   Asthma Paternal Grandfather    Lung cancer Maternal Grandfather    Hyperlipidemia Mother    Hypertension Mother    Cancer Mother        lung    Diabetes Father    Hyperlipidemia Maternal Grandmother    Stroke Maternal Grandmother    Cancer Paternal Grandmother    Alcohol abuse Paternal  Grandmother    Early death Paternal Grandmother    Healthy Son    Healthy Daughter     Social History   Tobacco Use   Smoking status: Former    Packs/day: 0.25    Years: 10.00    Pack years: 2.50    Types: Cigarettes    Start date: 2001    Quit date: 08/05/2008    Years since quitting: 12.7   Smokeless tobacco: Never  Substance Use Topics   Alcohol use: Yes    Subjective:   Patient presents for yearly CPE; no acute concerns today; Up to date on GYN needs; needs order for mammogram; Has been told by hematology that does not need iron infusions;   Review of Systems  Constitutional: Negative.   HENT: Negative.    Eyes: Negative.   Respiratory: Negative.    Cardiovascular: Negative.   Gastrointestinal: Negative.   Genitourinary: Negative.   Musculoskeletal: Negative.   Skin: Negative.   Endo/Heme/Allergies: Negative.   Psychiatric/Behavioral:  Positive for depression.        Stress from work    Objective:  Vitals:   04/27/21 0830  BP: 112/80  Pulse: (!) 104  Temp: 98.3 F (36.8 C)  TempSrc: Oral  SpO2: 98%  Weight: 172 lb 3.2 oz (78.1 kg)  Height: 5'  3" (1.6 m)    General: Well developed, well nourished, in no acute distress  Skin : Warm and dry.  Head: Normocephalic and atraumatic  Eyes: Sclera and conjunctiva clear; pupils round and reactive to light; extraocular movements intact  Ears: External normal; canals clear; tympanic membranes normal  Oropharynx: Pink, supple. No suspicious lesions  Neck: Supple without thyromegaly, adenopathy  Lungs: Respirations unlabored; clear to auscultation bilaterally without wheeze, rales, rhonchi  CVS exam: normal rate and regular rhythm.  Abdomen: Soft; nontender; nondistended; normoactive bowel sounds; no masses or hepatosplenomegaly  Musculoskeletal: No deformities; no active joint inflammation  Extremities: No edema, cyanosis, clubbing  Vessels: Symmetric bilaterally  Neurologic: Alert and oriented; speech intact;  face symmetrical; moves all extremities well; CNII-XII intact without focal deficit   Assessment:  1. PE (physical exam), annual   2. Screening mammogram for breast cancer   3. Sarcoidosis (HCC)   4. BMI 30.0-30.9,adult     Plan:  Age appropriate preventive healthcare needs addressed; encouraged regular eye doctor and dental exams; encouraged regular exercise; will update labs and refills as needed today; follow-up to be determined; Defers Shingrix today;  Continue with hematology and  pulmonology as scheduled; Refer to [facility];   This visit occurred during the SARS-CoV-2 public health emergency.  Safety protocols were in place, including screening questions prior to the visit, additional usage of staff PPE, and extensive cleaning of exam room while observing appropriate contact time as indicated for disinfecting solutions.    No follow-ups on file.  Orders Placed This Encounter  Procedures   MM Digital Screening    Standing Status:   Future    Standing Expiration Date:   04/27/2022    Order Specific Question:   Reason for Exam (SYMPTOM  OR DIAGNOSIS REQUIRED)    Answer:   screening mammogram    Order Specific Question:   Is the patient pregnant?    Answer:   No    Order Specific Question:   Preferred imaging location?    Answer:   [facility]   Amb Ref to Medical Weight Management    Referral Priority:   Routine    Referral Type:   Consultation    Number of Visits Requested:   1    Requested Prescriptions    No prescriptions requested or ordered in this encounter

## 2021-06-08 DIAGNOSIS — Z13820 Encounter for screening for osteoporosis: Secondary | ICD-10-CM | POA: Diagnosis not present

## 2021-06-22 ENCOUNTER — Encounter: Payer: Self-pay | Admitting: Family

## 2021-06-25 DIAGNOSIS — Z20822 Contact with and (suspected) exposure to covid-19: Secondary | ICD-10-CM | POA: Diagnosis not present

## 2021-06-29 ENCOUNTER — Telehealth: Payer: BC Managed Care – PPO | Admitting: Physician Assistant

## 2021-06-29 ENCOUNTER — Encounter: Payer: Self-pay | Admitting: Family

## 2021-06-29 DIAGNOSIS — R0602 Shortness of breath: Secondary | ICD-10-CM

## 2021-06-29 NOTE — Progress Notes (Signed)
Based on what you shared with me, I feel your condition warrants further evaluation and I recommend that you be seen in a face to face visit. Giving the shortness of breath along with significant URI symptoms you need to be seen in person.   NOTE: There will be NO CHARGE for this eVisit   If you are having a true medical emergency please call 911.      For an urgent face to face visit, [facility] has six urgent care centers for your convenience:     [facility] at [facility] Directions [phone] [address] [place], [place] [address]    [facility]) Get Driving Directions [phone] [address] [place], [place] [address]  [facility] Health Urgent Care Center [facility]) Get Driving Directions [phone] [address] [place],  [place]  [address]  [facility] Health Urgent Care at [facility] Get Driving Directions [phone] [address] [place], [place] [address]   [facility] Health Urgent Care at [facility] Get Driving Directions  [phone] [address] [place], [place] [address]   [facility] Health Urgent Care at [facility] Directions [phone] [address] [place], [place] [address]  Your MyChart E-visit questionnaire answers were reviewed by a board certified advanced clinical practitioner to complete your personal care plan based on your specific symptoms.  Thank you for using e-Visits.

## 2021-07-01 ENCOUNTER — Other Ambulatory Visit: Payer: Self-pay

## 2021-07-01 ENCOUNTER — Telehealth (INDEPENDENT_AMBULATORY_CARE_PROVIDER_SITE_OTHER): Payer: BC Managed Care – PPO | Admitting: Family

## 2021-07-01 ENCOUNTER — Ambulatory Visit (INDEPENDENT_AMBULATORY_CARE_PROVIDER_SITE_OTHER): Payer: BC Managed Care – PPO

## 2021-07-01 ENCOUNTER — Encounter: Payer: Self-pay | Admitting: Family

## 2021-07-01 VITALS — Ht 63.0 in | Wt 175.0 lb

## 2021-07-01 DIAGNOSIS — R042 Hemoptysis: Secondary | ICD-10-CM | POA: Diagnosis not present

## 2021-07-01 DIAGNOSIS — J209 Acute bronchitis, unspecified: Secondary | ICD-10-CM | POA: Diagnosis not present

## 2021-07-01 MED ORDER — PREDNISONE 20 MG PO TABS
40.0000 mg | ORAL_TABLET | Freq: Every day | ORAL | 0 refills | Status: DC
Start: 2021-07-01 — End: 2021-09-09

## 2021-07-01 MED ORDER — AZITHROMYCIN 250 MG PO TABS: ORAL_TABLET | ORAL | 0 refills | Status: DC

## 2021-07-01 NOTE — Telephone Encounter (Signed)
Called and spoke with patient who states that she has been having bronchitis issues now for almost 1 1/2 weeks. She said she thought it was covid, she was tested at [facility] and it was negative. Reached out to GP, could not be seen until 1:40 today virtually, requested zpack and prednisone, denied until visit was completed. Went to urgent care Tuesday, ridiculous wait times and she felt she was at risk waiting. She states this morning she started coughing up large amounts of bright red blood about 5-6 napkins full.....your thoughts on how to proceed Patient states that she did the video visit with her PCP today and they sent in a z pak and prednisone. She states they also did a CXR and she is waiting on those results. Advised patient if she needed anything to call and let us know. She expressed understanding. Will route to Dr. Maple Hudson as FYI ONLY. Nothing further needed at this time.

## 2021-07-01 NOTE — Telephone Encounter (Signed)
CXR to me looks similar to last March, pending Radiology interpretation. She has coughed up blood before. Suggest she go ahead with prednisone and antibiotic. Stay off aspirin. Let us know if bleeding continues through weekend. If it gets worse, go to ER.

## 2021-07-01 NOTE — Progress Notes (Signed)
Nancy Villarreal is a 50 y.o. female with the following history as recorded in EpicCare:  Patient Active Problem List   Diagnosis Date Noted   Acute bronchitis 11/16/2020   Rash and nonspecific skin eruption 09/18/2020   Upper respiratory infection, acute 06/10/2017   Bilateral hand pain 01/13/2017   Pain, joint, shoulder, left 01/13/2017   Pain in left ankle and joints of left foot 01/13/2017   High risk medication use 01/13/2017   Nausea 08/09/2016   Cough 03/09/2016   Obesity (BMI 30-39.9) 01/22/2016   Right foot pain 01/21/2016   Routine general medical examination at a health care facility 12/11/2013   Visit for screening mammogram 03/07/2012   Anemia, iron deficiency 02/10/2012   Sarcoidosis (HCC) 09/17/2008   Asthma in remission 09/03/2008   Allergic rhinitis due to pollen 09/03/2008    Current Outpatient Medications  Medication Sig Dispense Refill   azithromycin (ZITHROMAX) 250 MG tablet 2 tabs po qd x 1 day; 1 tablet per day x 4 days; 6 tablet 0   iron polysaccharides (NIFEREX) 150 MG capsule Take 1 capsule (150 mg total) by mouth daily. 90 capsule 1   JUNEL FE 1.5/30 1.5-30 MG-MCG tablet Take 1 tablet by mouth daily.     venlafaxine XR (EFFEXOR-XR) 75 MG 24 hr capsule TAKE 1 CAPSULE BY MOUTH DAILY FOR 1 WEEK THEN AS DIRECTED 90 capsule 1   predniSONE (DELTASONE) 20 MG tablet Take 2 tablets (40 mg total) by mouth daily with breakfast. 10 tablet 0   No current facility-administered medications for this visit.    Allergies: Hydrocodone  Past Medical History:  Diagnosis Date   Abnormal chest x-ray    Anemia    Hemoptysis    Sarcoid    bronch negative 09-2008 ACE 93   Unspecified asthma(493.90)     Past Surgical History:  Procedure Laterality Date   BRONCHOSCOPY  09-2008   Negative   CESAREAN SECTION     TUBAL LIGATION     1995    Family History  Problem Relation Age of Onset   Asthma Paternal Grandfather    Lung cancer Maternal Grandfather    Hyperlipidemia  Mother    Hypertension Mother    Cancer Mother        lung    Diabetes Father    Hyperlipidemia Maternal Grandmother    Stroke Maternal Grandmother    Cancer Paternal Grandmother    Alcohol abuse Paternal Grandmother    Early death Paternal Grandmother    Healthy Son    Healthy Daughter     Social History   Tobacco Use   Smoking status: Former    Packs/day: 0.25    Years: 10.00    Pack years: 2.50    Types: Cigarettes    Start date: 2001    Quit date: 08/05/2008    Years since quitting: 12.9   Smokeless tobacco: Never  Substance Use Topics   Alcohol use: Yes    Subjective:    I connected with Kizzie Fantasia on 07/01/21 at  1:40 PM EDT by a video enabled telemedicine application and verified that I am speaking with the correct person using two identifiers.   I discussed the limitations of evaluation and management by telemedicine and the availability of in person appointments. The patient expressed understanding and agreed to proceed. Provider in office/ patient is at home; provider and patient are only 2 people on video call.   Patient concerned for acute bronchitis; symptoms x 4-5 days;  history of sarcoidosis; did cough up blood this morning; waiting to hear back from her pulmonologist regarding follow up;   Negative home COVID test;     Objective:  Vitals:   07/01/21 1344  Weight: 175 lb (79.4 kg)  Height: 5\' 3"  (1.6 m)    General: Well developed, well nourished, in no acute distress  Skin : Warm and dry.  Head: Normocephalic and atraumatic  Lungs: Respirations unlabored;  Neurologic: Alert and oriented; speech intact; face symmetrical;   Assessment:  1. Hemoptysis   2. Acute bronchitis, unspecified organism     Plan:  Rx for Z-pak and prednisone which have been beneficial in the past; update CXR; follow up with pulmonology;  No follow-ups on file.  Orders Placed This Encounter  Procedures   DG Chest 2 View    Standing Status:   Future    Number of  Occurrences:   1    Standing Expiration Date:   07/01/2022    Order Specific Question:   Reason for Exam (SYMPTOM  OR DIAGNOSIS REQUIRED)    Answer:   hemoptysis x 1 day; cough x 5 days; negative COVID test    Order Specific Question:   Is patient pregnant?    Answer:   No    Order Specific Question:   Preferred imaging location?    Answer:   07/03/2022    Requested Prescriptions   Signed Prescriptions Disp Refills   predniSONE (DELTASONE) 20 MG tablet 10 tablet 0    Sig: Take 2 tablets (40 mg total) by mouth daily with breakfast.   azithromycin (ZITHROMAX) 250 MG tablet 6 tablet 0    Sig: 2 tabs po qd x 1 day; 1 tablet per day x 4 days;

## 2021-07-05 ENCOUNTER — Encounter: Payer: Self-pay | Admitting: Family

## 2021-07-06 ENCOUNTER — Other Ambulatory Visit: Payer: Self-pay | Admitting: Family

## 2021-07-06 DIAGNOSIS — R042 Hemoptysis: Secondary | ICD-10-CM

## 2021-07-06 NOTE — Telephone Encounter (Signed)
Pt had a CXR done on 07/01/21. Was a rpt discussed?

## 2021-07-23 ENCOUNTER — Ambulatory Visit: Payer: BC Managed Care – PPO | Admitting: Family

## 2021-08-10 ENCOUNTER — Encounter: Payer: Self-pay | Admitting: Family

## 2021-08-11 ENCOUNTER — Encounter: Payer: Self-pay | Admitting: Internal Medicine

## 2021-08-11 ENCOUNTER — Encounter: Payer: Self-pay | Admitting: Family

## 2021-08-12 ENCOUNTER — Encounter: Payer: Self-pay | Admitting: Family

## 2021-08-12 ENCOUNTER — Ambulatory Visit (INDEPENDENT_AMBULATORY_CARE_PROVIDER_SITE_OTHER): Payer: BC Managed Care – PPO

## 2021-08-12 DIAGNOSIS — R042 Hemoptysis: Secondary | ICD-10-CM

## 2021-08-12 DIAGNOSIS — R918 Other nonspecific abnormal finding of lung field: Secondary | ICD-10-CM | POA: Diagnosis not present

## 2021-08-13 NOTE — Telephone Encounter (Signed)
CY please advise. Thanks  She had the PNA 23 05/03/2011

## 2021-08-13 NOTE — Telephone Encounter (Signed)
I recommend Prevnar 20

## 2021-09-08 DIAGNOSIS — Z713 Dietary counseling and surveillance: Secondary | ICD-10-CM | POA: Diagnosis not present

## 2021-09-09 ENCOUNTER — Encounter: Payer: Self-pay | Admitting: Family

## 2021-09-09 ENCOUNTER — Ambulatory Visit (INDEPENDENT_AMBULATORY_CARE_PROVIDER_SITE_OTHER): Payer: BC Managed Care – PPO | Admitting: Family

## 2021-09-09 VITALS — BP 108/68 | HR 96 | Temp 98.8°F | Ht 63.0 in | Wt 182.6 lb

## 2021-09-09 DIAGNOSIS — E669 Obesity, unspecified: Secondary | ICD-10-CM | POA: Diagnosis not present

## 2021-09-09 DIAGNOSIS — R000 Tachycardia, unspecified: Secondary | ICD-10-CM | POA: Diagnosis not present

## 2021-09-09 LAB — CBC WITH DIFFERENTIAL/PLATELET
Basophils Absolute: 0 10*3/uL (ref 0.0–0.1)
Basophils Relative: 0.4 % (ref 0.0–3.0)
Eosinophils Absolute: 0.1 10*3/uL (ref 0.0–0.7)
Eosinophils Relative: 1.7 % (ref 0.0–5.0)
HCT: 37.3 % (ref 36.0–46.0)
Hemoglobin: 12 g/dL (ref 12.0–15.0)
Lymphocytes Relative: 40.2 % (ref 12.0–46.0)
Lymphs Abs: 1.3 10*3/uL (ref 0.7–4.0)
MCHC: 32.3 g/dL (ref 30.0–36.0)
MCV: 84.1 fl (ref 78.0–100.0)
Monocytes Absolute: 0.4 10*3/uL (ref 0.1–1.0)
Monocytes Relative: 12.9 % — ABNORMAL HIGH (ref 3.0–12.0)
Neutro Abs: 1.4 10*3/uL (ref 1.4–7.7)
Neutrophils Relative %: 44.8 % (ref 43.0–77.0)
Platelets: 155 10*3/uL (ref 150.0–400.0)
RBC: 4.43 Mil/uL (ref 3.87–5.11)
RDW: 13.8 % (ref 11.5–15.5)
WBC: 3.1 10*3/uL — ABNORMAL LOW (ref 4.0–10.5)

## 2021-09-09 LAB — COMPREHENSIVE METABOLIC PANEL
ALT: 19 U/L (ref 0–35)
AST: 24 U/L (ref 0–37)
Albumin: 3.6 g/dL (ref 3.5–5.2)
Alkaline Phosphatase: 33 U/L — ABNORMAL LOW (ref 39–117)
BUN: 12 mg/dL (ref 6–23)
CO2: 26 mEq/L (ref 19–32)
Calcium: 8.5 mg/dL (ref 8.4–10.5)
Chloride: 103 mEq/L (ref 96–112)
Creatinine, Ser: 0.81 mg/dL (ref 0.40–1.20)
GFR: 83.82 mL/min (ref >60.00)
Glucose, Bld: 73 mg/dL (ref 70–99)
Potassium: 4.3 mEq/L (ref 3.5–5.1)
Sodium: 135 mEq/L (ref 135–145)
Total Bilirubin: 0.3 mg/dL (ref 0.2–1.2)
Total Protein: 8 g/dL (ref 6.0–8.3)

## 2021-09-09 LAB — HEMOGLOBIN A1C: Hgb A1c MFr Bld: 6.2 % (ref 4.6–6.5)

## 2021-09-09 NOTE — Progress Notes (Signed)
Nancy Villarreal is a 51 y.o. female with the following history as recorded in EpicCare:  Patient Active Problem List   Diagnosis Date Noted   Acute bronchitis 11/16/2020   Rash and nonspecific skin eruption 09/18/2020   Upper respiratory infection, acute 06/10/2017   Bilateral hand pain 01/13/2017   Pain, joint, shoulder, left 01/13/2017   Pain in left ankle and joints of left foot 01/13/2017   High risk medication use 01/13/2017   Nausea 08/09/2016   Cough 03/09/2016   Obesity (BMI 30-39.9) 01/22/2016   Right foot pain 01/21/2016   Routine general medical examination at a health care facility 12/11/2013   Visit for screening mammogram 03/07/2012   Anemia, iron deficiency 02/10/2012   Sarcoidosis ([place]) 09/17/2008   Asthma in remission 09/03/2008   Allergic rhinitis due to pollen 09/03/2008    Current Outpatient Medications  Medication Sig Dispense Refill   iron polysaccharides (NIFEREX) 150 MG capsule Take 1 capsule (150 mg total) by mouth daily. 90 capsule 1   JUNEL FE 1.5/30 1.5-30 MG-MCG tablet Take 1 tablet by mouth daily.     venlafaxine XR (EFFEXOR-XR) 75 MG 24 hr capsule TAKE 1 CAPSULE BY MOUTH DAILY FOR 1 WEEK THEN AS DIRECTED 90 capsule 1   No current facility-administered medications for this visit.    Allergies: Hydrocodone  Past Medical History:  Diagnosis Date   Abnormal chest x-ray    Anemia    Hemoptysis    Sarcoid    bronch negative 09-2008 ACE 93   Unspecified asthma(493.90)     Past Surgical History:  Procedure Laterality Date   BRONCHOSCOPY  09-2008   Negative   CESAREAN SECTION     TUBAL LIGATION     1995    Family History  Problem Relation Age of Onset   Asthma Paternal Grandfather    Lung cancer Maternal Grandfather    Hyperlipidemia Mother    Hypertension Mother    Cancer Mother        lung    Diabetes Father    Hyperlipidemia Maternal Grandmother    Stroke Maternal Grandmother    Cancer Paternal Grandmother    Alcohol abuse Paternal  Grandmother    Early death Paternal Grandmother    Healthy Son    Healthy Daughter     Social History   Tobacco Use   Smoking status: Former    Packs/day: 0.25    Years: 10.00    Pack years: 2.50    Types: Cigarettes    Start date: 2001    Quit date: 08/05/2008    Years since quitting: 13.1   Smokeless tobacco: Never  Substance Use Topics   Alcohol use: Yes    Subjective:   Patient had CPE with physical recently and resting heart rate was noted to be elevated at 112; patient has noticed occasional sensation of "labored breathing" while exercising; denies any chest pain on exertion; does have underlying sarcoidosis;      Objective:  Vitals:   09/09/21 0943  BP: 108/68  Pulse: 96  Temp: 98.8 F (37.1 C)  TempSrc: Oral  SpO2: 98%  Weight: 182 lb 9.6 oz (82.8 kg)  Height: 5' 3"  (1.6 m)    General: Well developed, well nourished, in no acute distress  Skin : Warm and dry.  Head: Normocephalic and atraumatic  Lungs: Respirations unlabored; clear to auscultation bilaterally without wheeze, rales, rhonchi  CVS exam: normal rate and regular rhythm.  Neurologic: Alert and oriented; speech intact; face symmetrical;  moves all extremities well; CNII-XII intact without focal deficit   Assessment:  1. Tachycardia   2. Obesity (BMI 30-39.9)     Plan:  EKG shows sinus rhythm; exam is unremarkable; suspect history of sarcoidosis is contributing; will refer to cardiology for further evaluation; patient in agreement; Check labs today; assuming cardiology exam is unremarkable, to consider [place] and FRTMYT; she will go ahead and check with her insurance to see what coverage options are available.   This visit occurred during the SARS-CoV-2 public health emergency.  Safety protocols were in place, including screening questions prior to the visit, additional usage of staff PPE, and extensive cleaning of exam room while observing appropriate contact time as indicated for disinfecting  solutions.    No follow-ups on file.  Orders Placed This Encounter  Procedures   CBC with Differential/Platelet   Comp Met (CMET)   Hemoglobin A1c   Ambulatory referral to Cardiology    Referral Priority:   Routine    Referral Type:   Consultation    Referral Reason:   Specialty Services Required    Requested Specialty:   Cardiology    Number of Visits Requested:   1   EKG 12-Lead    Requested Prescriptions    No prescriptions requested or ordered in this encounter

## 2021-09-09 NOTE — Progress Notes (Deleted)
Nancy Villarreal is a 51 y.o. female with the following history as recorded in EpicCare:  Patient Active Problem List   Diagnosis Date Noted   Acute bronchitis 11/16/2020   Rash and nonspecific skin eruption 09/18/2020   Upper respiratory infection, acute 06/10/2017   Bilateral hand pain 01/13/2017   Pain, joint, shoulder, left 01/13/2017   Pain in left ankle and joints of left foot 01/13/2017   High risk medication use 01/13/2017   Nausea 08/09/2016   Cough 03/09/2016   Obesity (BMI 30-39.9) 01/22/2016   Right foot pain 01/21/2016   Routine general medical examination at a health care facility 12/11/2013   Visit for screening mammogram 03/07/2012   Anemia, iron deficiency 02/10/2012   Sarcoidosis ([place]) 09/17/2008   Asthma in remission 09/03/2008   Allergic rhinitis due to pollen 09/03/2008    Current Outpatient Medications  Medication Sig Dispense Refill   iron polysaccharides (NIFEREX) 150 MG capsule Take 1 capsule (150 mg total) by mouth daily. 90 capsule 1   JUNEL FE 1.5/30 1.5-30 MG-MCG tablet Take 1 tablet by mouth daily.     venlafaxine XR (EFFEXOR-XR) 75 MG 24 hr capsule TAKE 1 CAPSULE BY MOUTH DAILY FOR 1 WEEK THEN AS DIRECTED 90 capsule 1   No current facility-administered medications for this visit.    Allergies: Hydrocodone  Past Medical History:  Diagnosis Date   Abnormal chest x-ray    Anemia    Hemoptysis    Sarcoid    bronch negative 09-2008 ACE 93   Unspecified asthma(493.90)     Past Surgical History:  Procedure Laterality Date   BRONCHOSCOPY  09-2008   Negative   CESAREAN SECTION     TUBAL LIGATION     1995    Family History  Problem Relation Age of Onset   Asthma Paternal Grandfather    Lung cancer Maternal Grandfather    Hyperlipidemia Mother    Hypertension Mother    Cancer Mother        lung    Diabetes Father    Hyperlipidemia Maternal Grandmother    Stroke Maternal Grandmother    Cancer Paternal Grandmother    Alcohol abuse Paternal  Grandmother    Early death Paternal Grandmother    Healthy Son    Healthy Daughter     Social History   Tobacco Use   Smoking status: Former    Packs/day: 0.25    Years: 10.00    Pack years: 2.50    Types: Cigarettes    Start date: 2001    Quit date: 08/05/2008    Years since quitting: 13.1   Smokeless tobacco: Never  Substance Use Topics   Alcohol use: Yes    Subjective:  Patient had CPE with her employer recently and fasting heart rate was noted to be elevated at 112; patient had not noticed previously but does admit to some sensation of labored breathing while exercising; does have underlying sarcoidosis;      Objective:  Vitals:   09/09/21 0943  BP: 108/68  Pulse: 96  Temp: 98.8 F (37.1 C)  TempSrc: Oral  SpO2: 98%  Weight: 182 lb 9.6 oz (82.8 kg)  Height: 5\' 3"  (1.6 m)    General: Well developed, well nourished, in no acute distress  Skin : Warm and dry.  Head: Normocephalic and atraumatic  Eyes: Sclera and conjunctiva clear; pupils round and reactive to light; extraocular movements intact  Ears: External normal; canals clear; tympanic membranes normal  Oropharynx: Pink, supple. No  suspicious lesions  Neck: Supple without thyromegaly, adenopathy  Lungs: Respirations unlabored; clear to auscultation bilaterally without wheeze, rales, rhonchi  CVS exam: {heart exam:315510::"normal rate, regular rhythm, normal S1, S2, no murmurs, rubs, clicks or gallops"}.  Abdomen: Soft; nontender; nondistended; normoactive bowel sounds; no masses or hepatosplenomegaly  Musculoskeletal: No deformities; no active joint inflammation  Extremities: No edema, cyanosis, clubbing  Vessels: Symmetric bilaterally  Neurologic: Alert and oriented; speech intact; face symmetrical; moves all extremities well; CNII-XII intact without focal deficit  Assessment:  1. Tachycardia     Plan:  ***   No follow-ups on file.  Orders Placed This Encounter  Procedures   EKG 12-Lead     Requested Prescriptions    No prescriptions requested or ordered in this encounter

## 2021-09-10 ENCOUNTER — Ambulatory Visit: Payer: BC Managed Care – PPO

## 2021-09-13 ENCOUNTER — Other Ambulatory Visit: Payer: Self-pay | Admitting: Family

## 2021-09-15 ENCOUNTER — Encounter: Payer: Self-pay | Admitting: Family

## 2021-09-15 ENCOUNTER — Other Ambulatory Visit: Payer: Self-pay | Admitting: Family

## 2021-09-15 MED ORDER — VENLAFAXINE HCL ER 75 MG PO CP24: 150.0000 mg | ORAL_CAPSULE | Freq: Every day | ORAL | 1 refills | Status: DC

## 2021-09-17 NOTE — Telephone Encounter (Signed)
I have called pt and there was no answer so I left a message to call back.  ?

## 2021-09-17 NOTE — Telephone Encounter (Signed)
Pt has called back and she stated and today she is able to keep something down. She was having very bad withdrawal symptoms and feels better today. The last 2 days were very rough. She was only able to keep down water and jello.   Pt stated that she will be very mindful not to run out of medication again she stated that she has learned her lesson.   She also expressed " thanks Vernona Rieger for checking up on me, Your great."

## 2021-09-23 ENCOUNTER — Encounter: Payer: Self-pay | Admitting: Family

## 2021-09-29 DIAGNOSIS — R7303 Prediabetes: Secondary | ICD-10-CM | POA: Diagnosis not present

## 2021-10-05 ENCOUNTER — Ambulatory Visit: Payer: BC Managed Care – PPO

## 2021-10-07 ENCOUNTER — Ambulatory Visit
Admission: RE | Admit: 2021-10-07 | Discharge: 2021-10-07 | Disposition: A | Discharge status: Home / Self Care | Payer: BC Managed Care – PPO | Source: Ambulatory Visit | Attending: Family | Admitting: Family

## 2021-10-07 ENCOUNTER — Other Ambulatory Visit: Payer: Self-pay

## 2021-10-07 DIAGNOSIS — Z1231 Encounter for screening mammogram for malignant neoplasm of breast: Secondary | ICD-10-CM

## 2021-10-11 ENCOUNTER — Telehealth: Payer: Self-pay | Admitting: Oncology

## 2021-10-11 NOTE — Telephone Encounter (Signed)
Rescheduled 02/17 appointment to April due to provider pal. Patient has been called and notified.

## 2021-10-12 ENCOUNTER — Ambulatory Visit (INDEPENDENT_AMBULATORY_CARE_PROVIDER_SITE_OTHER): Payer: BC Managed Care – PPO | Admitting: Cardiovascular Disease

## 2021-10-12 ENCOUNTER — Ambulatory Visit: Payer: BC Managed Care – PPO

## 2021-10-12 ENCOUNTER — Encounter: Payer: Self-pay | Admitting: Cardiovascular Disease

## 2021-10-12 ENCOUNTER — Other Ambulatory Visit: Payer: Self-pay

## 2021-10-12 VITALS — BP 130/78 | HR 84 | Ht 62.0 in | Wt 183.8 lb

## 2021-10-12 DIAGNOSIS — E782 Mixed hyperlipidemia: Secondary | ICD-10-CM | POA: Diagnosis not present

## 2021-10-12 DIAGNOSIS — E785 Hyperlipidemia, unspecified: Secondary | ICD-10-CM | POA: Diagnosis not present

## 2021-10-12 DIAGNOSIS — R000 Tachycardia, unspecified: Secondary | ICD-10-CM

## 2021-10-12 NOTE — Progress Notes (Unsigned)
Enrolled for [employer] to mail a ZIO XT long term holter monitor to the patients address on file.  

## 2021-10-12 NOTE — Patient Instructions (Addendum)
Medication Instructions:  Your physician recommends that you continue on your current medications as directed. Please refer to the Current Medication list given to you today.  *If you need a refill on your cardiac medications before your next appointment, please call your pharmacy*   Testing/Procedures: Your physician has requested that you have an echocardiogram. Echocardiography is a painless test that uses sound waves to create images of your heart. It provides your doctor with information about the size and shape of your heart and how well your hearts chambers and valves are working. This procedure takes approximately one hour. There are no restrictions for this procedure. This procedure is done at [address]   Dr. Allyson Sabal has ordered a CT coronary calcium score.   Test location:  [facility] ([address]rd Floor [place], [place] [address])   This is $99 out of pocket.   Coronary CalciumScan A coronary calcium scan is an imaging test used to look for deposits of calcium and other fatty materials (plaques) in the inner lining of the blood vessels of the heart (coronary arteries). These deposits of calcium and plaques can partly clog and narrow the coronary arteries without producing any symptoms or warning signs. This puts a person at risk for a heart attack. This test can detect these deposits before symptoms develop. Tell a health care provider about: Any allergies you have. All medicines you are taking, including vitamins, herbs, eye drops, creams, and over-the-counter medicines. Any problems you or family members have had with anesthetic medicines. Any blood disorders you have. Any surgeries you have had. Any medical conditions you have. Whether you are pregnant or may be pregnant. What are the risks? Generally, this is a safe procedure. However, problems may occur, including: Harm to a pregnant woman and her unborn baby. This test involves the use of  radiation. Radiation exposure can be dangerous to a pregnant woman and her unborn baby. If you are pregnant, you generally should not have this procedure done. Slight increase in the risk of cancer. This is because of the radiation involved in the test. What happens before the procedure? No preparation is needed for this procedure. What happens during the procedure? You will undress and remove any jewelry around your neck or chest. You will put on a hospital gown. Sticky electrodes will be placed on your chest. The electrodes will be connected to an electrocardiogram (ECG) machine to record a tracing of the electrical activity of your heart. A CT scanner will take pictures of your heart. During this time, you will be asked to lie still and hold your breath for 2-3 seconds while a picture of your heart is being taken. The procedure may vary among health care providers and hospitals. What happens after the procedure? You can get dressed. You can return to your normal activities. It is up to you to get the results of your test. Ask your health care provider, or the department that is doing the test, when your results will be ready. Summary A coronary calcium scan is an imaging test used to look for deposits of calcium and other fatty materials (plaques) in the inner lining of the blood vessels of the heart (coronary arteries). Generally, this is a safe procedure. Tell your health care provider if you are pregnant or may be pregnant. No preparation is needed for this procedure. A CT scanner will take pictures of your heart. You can return to your normal activities after the scan is done.  This information is not intended to replace advice given to you by your health care provider. Make sure you discuss any questions you have with your health care provider. Document Released: 02/18/2008 Document Revised: 07/11/2016 Document Reviewed: 07/11/2016 Elsevier Interactive Patient Education  2017 Elsevier  Inc.   Nancy Villarreal- Long Term Monitor Instructions  Your physician has requested you wear a ZIO patch monitor for 14 days.  This is a single patch monitor. [employer] supplies one patch monitor per enrollment. Additional stickers are not available. Please do not apply patch if you will be having a Nuclear Stress Test,  Echocardiogram, Cardiac CT, MRI, or Chest Xray during the period you would be wearing the  monitor. The patch cannot be worn during these tests. You cannot remove and re-apply the  ZIO XT patch monitor.  Your ZIO patch monitor will be mailed 3 day USPS to your address on file. It may take 3-5 days  to receive your monitor after you have been enrolled.  Once you have received your monitor, please review the enclosed instructions. Your monitor  has already been registered assigning a specific monitor serial # to you.  Billing and Patient Assistance Program Information  We have supplied [employer] with any of your insurance information on file for billing purposes. [employer] offers a sliding scale Patient Assistance Program for patients that do not have  insurance, or whose insurance does not completely cover the cost of the ZIO monitor.  You must apply for the Patient Assistance Program to qualify for this discounted rate.  To apply, please call [employer] at [phone], select option 4, select option 2, ask to apply for  Patient Assistance Program. Nancy Villarreal will ask your household income, and how many people  are in your household. They will quote your out-of-pocket cost based on that information.  [employer] will also be able to set up a 14-month, interest-free payment plan if needed.  Applying the monitor   Shave hair from upper left chest.  Hold abrader disc by orange tab. Rub abrader in 40 strokes over the upper left chest as  indicated in your monitor instructions.  Clean area with 4 enclosed alcohol pads. Let dry.  Apply patch as indicated in monitor instructions. Patch will be  placed under collarbone on left  side of chest with arrow pointing upward.  Rub patch adhesive wings for 2 minutes. Remove white label marked "1". Remove the white  label marked "2". Rub patch adhesive wings for 2 additional minutes.  While looking in a mirror, press and release button in center of patch. A small green light will  flash 3-4 times. This will be your only indicator that the monitor has been turned on.   Do not shower for the first 24 hours. You may shower after the first 24 hours.   Press the button if you feel a symptom. You will hear a small click. Record Date, Time and  Symptom in the Patient Logbook.  When you are ready to remove the patch, follow instructions on the last 2 pages of Patient  Logbook. Stick patch monitor onto the last page of Patient Logbook.  Place Patient Logbook in the blue and white box. Use locking tab on box and tape box closed  securely. The blue and white box has prepaid postage on it. Please place it in the mailbox as  soon as possible. Your physician should have your test results approximately 7 days after the  monitor has been mailed back to [facility].  Call  [employer] Customer Care at [phone] if you have questions regarding  your ZIO XT patch monitor. Call them immediately if you see an orange light blinking on your  monitor.  If your monitor falls off in less than 4 days, contact our Monitor department at [phone].  If your monitor becomes loose or falls off after 4 days call [employer] at [phone] for  suggestions on securing your monitor    Follow-Up: At [facility], you and your health needs are our priority.  As part of our continuing mission to provide you with exceptional heart care, we have created designated Provider Care Teams.  These Care Teams include your primary Cardiologist (physician) and Advanced Practice Providers (APPs -  Physician Assistants and Nurse Practitioners) who all work together to provide  you with the care you need, when you need it.  We recommend signing up for the patient portal called "MyChart".  Sign up information is provided on this After Visit Summary.  MyChart is used to connect with patients for Virtual Visits (Telemedicine).  Patients are able to view lab/test results, encounter notes, upcoming appointments, etc.  Non-urgent messages can be sent to your provider as well.   To learn more about what you can do with MyChart, go to [email or web address].    Your next appointment:   3 month(s)  The format for your next appointment:   In Person  Provider:   Nanetta Batty, MD

## 2021-10-12 NOTE — Assessment & Plan Note (Signed)
Recent lipid profile performed 01/13/2021 revealed total cholesterol 216 and LDL of 113.  I am going to get a coronary calcium score to help determine how aggressive to be with risk factor modification.

## 2021-10-12 NOTE — Assessment & Plan Note (Signed)
Nancy Villarreal was referred to me by Jodi Mourning  NP for resting sinus tachycardia.  She does work in a stressful job.  Her thyroid function tests are normal.  She does have sarcoidosis treated by Dr. Annamaria Boots primarily involving her lungs.  She is unaware of palpitations or tachycardia.  I am going to get a 2-week Zio patch and a 2D echocardiogram.

## 2021-10-12 NOTE — Progress Notes (Signed)
[id number] [place]   [id number]  [id number]  Primary Physician Nancy Villarreal, [place] Primary Cardiologist: Nancy Harp MD FACP, [place], [place], [place]  HPI:  Nancy Villarreal is a 51 y.o. mild to moderately overweight married African-American female mother of 2, grandmother of 4 grandchildren referred by Nancy Mourning, NP for evaluation of resting tachycardia.  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 Annamaria Villarreal, pulmonology.  She basically has no cardiac risk factors except for mild untreated hyperlipidemia.  There is no family history for heart disease.  She is never had a heart attack or stroke.  She denies chest pain but does get some shortness of breath primarily related to her sarcoidosis.  She is noted to have a resting sinus tachycardia although she denies palpitations.  Her thyroid function tests are normal.   Current Meds  Medication Sig   iron polysaccharides (NIFEREX) 150 MG capsule Take 1 capsule (150 mg total) by mouth daily.   JUNEL FE 1.5/30 1.5-30 MG-MCG tablet Take 1 tablet by mouth daily.   venlafaxine XR (EFFEXOR-XR) 75 MG 24 hr capsule Take 2 capsules (150 mg total) by mouth daily with breakfast.     Allergies  Allergen Reactions   Hydrocodone Nausea And Vomiting    Social History   Socioeconomic History   Marital status: Married    Spouse name: Not on file   Number of children: Not on file   Years of education: Not on file   Highest education level: Not on file  Occupational History   Occupation: Geophysicist/field seismologist  Tobacco Use   Smoking status: Former    Packs/day: 0.25    Years: 10.00    Pack years: 2.50    Types: Cigarettes    Start date: 2001    Quit date: 08/05/2008    Years since quitting: 13.1   Smokeless tobacco: Never  Vaping Use   Vaping Use: Never used  Substance and Sexual Activity   Alcohol use: Yes   Drug use: Never   Sexual activity: Not  Currently    Birth control/protection: Surgical  Other Topics Concern   Not on file  Social History Narrative   ** Merged History Encounter **       Social Determinants of Health   Financial Resource Strain: Not on file  Food Insecurity: Not on file  Transportation Needs: Not on file  Physical Activity: Not on file  Stress: Not on file  Social Connections: Not on file  Intimate Partner Violence: Not on file     Review of Systems: General: negative for chills, fever, night sweats or weight changes.  Cardiovascular: negative for chest pain, dyspnea on exertion, edema, orthopnea, palpitations, paroxysmal nocturnal dyspnea or shortness of breath Dermatological: negative for rash Respiratory: negative for cough or wheezing Urologic: negative for hematuria Abdominal: negative for nausea, vomiting, diarrhea, bright red blood per rectum, melena, or hematemesis Neurologic: negative for visual changes, syncope, or dizziness All other systems reviewed and are otherwise negative except as noted above.    Blood pressure 130/78, pulse 84, height 5\' 2"  (1.575 m), weight 183 lb 12.8 oz (83.4 kg), SpO2 96 %.  General appearance: alert and no distress Neck: no adenopathy, no carotid bruit, no JVD, supple, symmetrical, trachea midline, and thyroid not enlarged, symmetric, no tenderness/mass/nodules Lungs: clear to auscultation bilaterally Heart: regular rate and rhythm, S1, S2 normal, no  murmur, click, rub or gallop Extremities: extremities normal, atraumatic, no cyanosis or edema Pulses: 2+ and symmetric Skin: Skin color, texture, turgor normal. No rashes or lesions Neurologic: Grossly normal  EKG sinus rhythm 84 without ST or T wave changes.  I personally reviewed this EKG.  ASSESSMENT AND PLAN:   Sinus tachycardia Nancy Villarreal was referred to me by Nancy Mourning  NP for resting sinus tachycardia.  She does work in a stressful job.  Her thyroid function tests are normal.  She does have  sarcoidosis treated by Dr. Annamaria Villarreal primarily involving her lungs.  She is unaware of palpitations or tachycardia.  I am going to get a 2-week Zio patch and a 2D echocardiogram.  Hyperlipidemia Recent lipid profile performed 01/13/2021 revealed total cholesterol 216 and LDL of 113.  I am going to get a coronary calcium score to help determine how aggressive to be with risk factor modification.     Nancy Harp MD FACP,FACC,FAHA, [facility] 10/12/2021 4:44 PM

## 2021-10-21 ENCOUNTER — Inpatient Hospital Stay: Payer: BC Managed Care – PPO

## 2021-10-21 ENCOUNTER — Inpatient Hospital Stay: Payer: BC Managed Care – PPO | Admitting: Oncology

## 2021-10-22 ENCOUNTER — Other Ambulatory Visit: Payer: BC Managed Care – PPO

## 2021-10-22 ENCOUNTER — Ambulatory Visit: Payer: BC Managed Care – PPO | Admitting: Oncology

## 2021-10-30 DIAGNOSIS — R7303 Prediabetes: Secondary | ICD-10-CM | POA: Diagnosis not present

## 2021-11-16 ENCOUNTER — Encounter: Payer: Self-pay | Admitting: Family

## 2021-11-16 ENCOUNTER — Other Ambulatory Visit: Payer: Self-pay | Admitting: Family

## 2021-11-16 MED ORDER — POLYSACCHARIDE IRON COMPLEX 150 MG PO CAPS: 150.0000 mg | ORAL_CAPSULE | Freq: Every day | ORAL | 1 refills | Status: DC

## 2021-11-18 ENCOUNTER — Ambulatory Visit (HOSPITAL_COMMUNITY): Payer: BC Managed Care – PPO | Attending: Cardiology

## 2021-11-18 ENCOUNTER — Ambulatory Visit (INDEPENDENT_AMBULATORY_CARE_PROVIDER_SITE_OTHER)
Admission: RE | Admit: 2021-11-18 | Discharge: 2021-11-18 | Disposition: A | Discharge status: Home / Self Care | Payer: Self-pay | Source: Ambulatory Visit | Attending: Cardiovascular Disease | Admitting: Cardiovascular Disease

## 2021-11-18 ENCOUNTER — Other Ambulatory Visit: Payer: Self-pay

## 2021-11-18 DIAGNOSIS — E782 Mixed hyperlipidemia: Secondary | ICD-10-CM | POA: Diagnosis not present

## 2021-11-18 DIAGNOSIS — R000 Tachycardia, unspecified: Secondary | ICD-10-CM | POA: Diagnosis not present

## 2021-11-18 LAB — ECHOCARDIOGRAM COMPLETE
Area-P 1/2: 4.1 cm2
S' Lateral: 2.7 cm

## 2021-11-27 DIAGNOSIS — R7303 Prediabetes: Secondary | ICD-10-CM | POA: Diagnosis not present

## 2021-12-09 ENCOUNTER — Inpatient Hospital Stay: Payer: BC Managed Care – PPO | Admitting: Oncology

## 2021-12-09 ENCOUNTER — Inpatient Hospital Stay: Payer: BC Managed Care – PPO

## 2021-12-17 ENCOUNTER — Other Ambulatory Visit: Payer: Self-pay | Admitting: Family

## 2021-12-23 ENCOUNTER — Ambulatory Visit (INDEPENDENT_AMBULATORY_CARE_PROVIDER_SITE_OTHER): Payer: BC Managed Care – PPO | Admitting: Family

## 2021-12-23 ENCOUNTER — Other Ambulatory Visit: Payer: Self-pay | Admitting: Family

## 2021-12-23 ENCOUNTER — Encounter: Payer: Self-pay | Admitting: Family

## 2021-12-23 VITALS — BP 110/70 | HR 90 | Temp 98.6°F | Ht 63.0 in | Wt 185.0 lb

## 2021-12-23 DIAGNOSIS — E669 Obesity, unspecified: Secondary | ICD-10-CM

## 2021-12-23 DIAGNOSIS — D869 Sarcoidosis, unspecified: Secondary | ICD-10-CM

## 2021-12-23 DIAGNOSIS — R058 Other specified cough: Secondary | ICD-10-CM | POA: Diagnosis not present

## 2021-12-23 MED ORDER — AZITHROMYCIN 250 MG PO TABS: ORAL_TABLET | ORAL | 0 refills | Status: DC

## 2021-12-23 MED ORDER — WEGOVY 0.25 MG/0.5ML ~~LOC~~ SOAJ: 0.2500 mg | SUBCUTANEOUS | 0 refills | Status: DC

## 2021-12-23 MED ORDER — PREDNISONE 20 MG PO TABS: 40.0000 mg | ORAL_TABLET | Freq: Every day | ORAL | 0 refills | Status: DC

## 2021-12-23 NOTE — Progress Notes (Signed)
?Nancy Villarreal is a 51 y.o. female with the following history as recorded in EpicCare:  ?Patient Active Problem List  ? Diagnosis Date Noted  ? Sinus tachycardia 10/12/2021  ? Hyperlipidemia 10/12/2021  ? Acute bronchitis 11/16/2020  ? Rash and nonspecific skin eruption 09/18/2020  ? Upper respiratory infection, acute 06/10/2017  ? Bilateral hand pain 01/13/2017  ? Pain, joint, shoulder, left 01/13/2017  ? Pain in left ankle and joints of left foot 01/13/2017  ? High risk medication use 01/13/2017  ? Nausea 08/09/2016  ? Cough 03/09/2016  ? Obesity (BMI 30-39.9) 01/22/2016  ? Right foot pain 01/21/2016  ? Routine general medical examination at a health care facility 12/11/2013  ? Visit for screening mammogram 03/07/2012  ? Anemia, iron deficiency 02/10/2012  ? Sarcoidosis ([facility]) 09/17/2008  ? Asthma in remission 09/03/2008  ? Allergic rhinitis due to pollen 09/03/2008  ?  ?Current Outpatient Medications  ?Medication Sig Dispense Refill  ? azithromycin (ZITHROMAX) 250 MG tablet 2 tabs po qd x 1 day; 1 tablet per day x 4 days; 6 tablet 0  ? iron polysaccharides (NIFEREX) 150 MG capsule Take 1 capsule (150 mg total) by mouth daily. 90 capsule 1  ? JUNEL FE 1.5/30 1.5-30 MG-MCG tablet Take 1 tablet by mouth daily.    ? predniSONE (DELTASONE) 20 MG tablet Take 2 tablets (40 mg total) by mouth daily with breakfast. 10 tablet 0  ? Semaglutide-Weight Management (WEGOVY) 0.25 MG/0.5ML SOAJ Inject 0.25 mg into the skin once a week. 2 mL 0  ? venlafaxine XR (EFFEXOR-XR) 75 MG 24 hr capsule TAKE 2 CAPSULES(150 MG) BY MOUTH DAILY WITH BREAKFAST 180 capsule 1  ? ?No current facility-administered medications for this visit.  ?  ?Allergies: Hydrocodone  ?Past Medical History:  ?Diagnosis Date  ? Abnormal chest x-ray   ? Anemia   ? Hemoptysis   ? Sarcoid   ? bronch negative 09-2008 ACE 93  ? Unspecified asthma(493.90)   ?  ?Past Surgical History:  ?Procedure Laterality Date  ? BRONCHOSCOPY  09-2008  ? Negative  ? CESAREAN SECTION     ? TUBAL LIGATION    ? 1995  ?  ?Family History  ?Problem Relation Age of Onset  ? Asthma Paternal Grandfather   ? Lung cancer Maternal Grandfather   ? Hyperlipidemia Mother   ? Hypertension Mother   ? Cancer Mother   ?     lung   ? Diabetes Father   ? Hyperlipidemia Maternal Grandmother   ? Stroke Maternal Grandmother   ? Cancer Paternal Grandmother   ? Alcohol abuse Paternal Grandmother   ? Early death Paternal Grandmother   ? Healthy Son   ? Healthy Daughter   ?  ?Social History  ? ?Tobacco Use  ? Smoking status: Former  ?  Packs/day: 0.25  ?  Years: 10.00  ?  Pack years: 2.50  ?  Types: Cigarettes  ?  Start date: 2001  ?  Quit date: 08/05/2008  ?  Years since quitting: 13.3  ? Smokeless tobacco: Never  ?Substance Use Topics  ? Alcohol use: Yes  ?  ?Subjective:  ?Concerned for sarcoidosis flare; symptoms presents x 1 week; has coughed up some blood; 3rd sarcoid flare in past 6 months; has not seen her pulmonologist since June 2022; recently completed cardiac evaluation- felt that heart having to work harder to compensate for lungs; ? ?Would also like to consider starting weight loss medication; had been discussed previously but was put on hold  until cardiac clearance given;  ? ? ? ? ?Objective:  ?Vitals:  ? 12/23/21 0823  ?BP: 110/70  ?Pulse: 90  ?Temp: 98.6 ?F (37 ?C)  ?TempSrc: Oral  ?SpO2: 98%  ?Weight: 185 lb (83.9 kg)  ?Height: 5\' 3"  (1.6 m)  ?  ?General: Well developed, well nourished, in no acute distress  ?Skin : Warm and dry.  ?Head: Normocephalic and atraumatic  ?Eyes: Sclera and conjunctiva clear; pupils round and reactive to light; extraocular movements intact  ?Ears: External normal; canals clear; tympanic membranes normal  ?Oropharynx: Pink, supple. No suspicious lesions  ?Neck: Supple without thyromegaly, adenopathy  ?Lungs: Respirations unlabored; clear to auscultation bilaterally without wheeze, rales, rhonchi  ?CVS exam: normal rate and regular rhythm.  ?Neurologic: Alert and oriented; speech  intact; face symmetrical; moves all extremities well; CNII-XII intact without focal deficit  ? ?Assessment:  ?1. Sarcoidosis (HCC)   ?2. Other cough   ?3. Obesity (BMI 30-39.9)   ?  ?Plan:  ?Encouraged patient to reach out to her pulmonologist to let him know about frequent sarcoid flares in past 6 months; will re-treat with combination of Zithromax and prednisone;  ?Rx for [facility]; risks and benefits updated; follow up to be determined based on insurance approval.  ? ?This visit occurred during the SARS-CoV-2 public health emergency.  Safety protocols were in place, including screening questions prior to the visit, additional usage of staff PPE, and extensive cleaning of exam room while observing appropriate contact time as indicated for disinfecting solutions.  ? ? ?No follow-ups on file.  ?No orders of the defined types were placed in this encounter. ?  ?Requested Prescriptions  ? ?Signed Prescriptions Disp Refills  ? predniSONE (DELTASONE) 20 MG tablet 10 tablet 0  ?  Sig: Take 2 tablets (40 mg total) by mouth daily with breakfast.  ? azithromycin (ZITHROMAX) 250 MG tablet 6 tablet 0  ?  Sig: 2 tabs po qd x 1 day; 1 tablet per day x 4 days;  ? Semaglutide-Weight Management (WEGOVY) 0.25 MG/0.5ML SOAJ 2 mL 0  ?  Sig: Inject 0.25 mg into the skin once a week.  ?  ? ?

## 2021-12-23 NOTE — Patient Instructions (Signed)
Please go ahead and call Dr. Maple Hudson; let him know that this is your 3rd flare in 6 months;  ?

## 2021-12-25 NOTE — Progress Notes (Signed)
? Patient ID: Nancy Villarreal, female    DOB: May 19, 1971, 51 y.o.   MRN: [id number] ? ?HPI ?F former smoker followed here with sarcoid Stage III,  Bronch NEG 09/24/08- dx based on mediastinal adenopathy/ interstitial disease,chronic elev ACE , response to steroid. Complicated by Iron def anemia, plasma cell disorder. ?Bronch NEG 09/24/08- dx based on mediastinal adenopathy/ interstitial disease, ACE 90, response to steroids. ?PFT 10/11/16- ?ACE 09/30/16- 86H ? ACE 12/07/17- 97, up from 89 in 2018 ?ACE 01/08/19- 67 WNL ?ACE 08/04/20- 80H ?Immunoglobulins 12/19/16- IgG 2,669 ?---------------------------------------------------------------------------------------------------- ? ?02/12/21- 51yoF former smoker followed here with Sarcoid Stage III ( Bronch NEG 09/24/08- dx based on mediastinal adenopathy/ interstitial disease, chronic elevation of ACE, response to steroid), Hemoptysis, Bronchitis, Complicated by Iron def anemia, plasma cell disorder ?-Albuterol hfa, MTX per Rheumatology for dx osteoarthritis.  ?Short term Disability -status? ?Covid vax- 3 [employer] ?Saw NP 3/14 for acute bronchitis> Zpak, prednisone taper, Mucinex,  ?Getting married in August. ?No cough or wheeze- not needing her albuterol inhaler. Getting iron infusion for anemia. ?CXR 11/16/20- ?IMPRESSION: ?Stable bilateral reticular densities are noted most consistent with ?scarring related or sarcoidosis. ? ?12/28/21- 51yoF former smoker followed here with Sarcoid Stage III ( Bronch NEG 09/24/08- dx based on mediastinal adenopathy/ interstitial disease, chronic elevation of ACE, response to steroid), Hemoptysis, Bronchitis, Complicated by Iron def anemia, plasma cell disorder ?-Albuterol hfa,  ?Short term Disability -status? ?Covid vax- 5 [employer] ?Flu vax- had ?ZPak and prednisone started 4/20 by PCP. ?Has had 3 episodes in past 6 months attributed to Sarcoid flares. ?ACE 1 yr ago 92, but mostly elevated over past 5 years.  ?------Patient states that she is still  coughing off and on throughout the day. States that before she got here she coughed up some blood. Patient feels like breathing is worse since last visit.  ?Has coughed up a teaspoon amounts of blood twice in the last couple of weeks.  Denies fever, adenopathy, rash.  Dyspnea on unusual exertion not interfering with ADLs.  Basic exercise tolerance stable.  Having some cardiology work-up related to tachycardia, leading to CT cardiac scoring study.  Pulmonary results of this were reviewed with her today.  Note evidence of pulmonary hypertension ?She is older than most patients with active pulmonary sarcoid but noted to have sustained elevated ACE levels over the last several years.  Coming off of methotrexate may have disinhibited sarcoid activity. ? ? ?CT Cardiac scoring 11/18/21(lung component) ?IMPRESSION: ?1. Spectrum of extensive pulmonary parenchymal findings most ?compatible with pulmonary sarcoidosis, as detailed, progressive ?since 10/27/2016 chest CT. Suggest dedicated follow-up ?high-resolution chest CT study in 3-6 months. ?2. Dilated main pulmonary artery, suggesting pulmonary arterial ?hypertension. ?3. Stable calcified mediastinal and bilateral hilar lymph nodes, ?compatible with sarcoidosis. 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 No definite evidence of superimposed acute ?cardiopulmonary disease ? ? ?Review of Systems-see HPI   + = positive ?HEENT:   No headaches,  Difficulty swallowing,  Tooth/dental problems,  Sore throat,  ?              No sneezing, itching, ear ache, nasal congestion, post nasal drip,  ?CV:  No- chest pain,  No-PND, swelling in lower extremities, anasarca, dizziness, palpitations ?GI  No heartburn, indigestion, abdominal pain, nausea, vomiting,  ?Resp:  No acute shortness of breath with exertion .  No excess mucus, +productive cough, ?  non-productive cough,  +recent coughing up of blood.  No change in color of mucus.  No wheezing.  ?Skin: no rash or lesions. ?GU: ?MS: joint pain .  No decreased range of motion.  No back pain. ?Psych:  No change in mood or affect. No depression or anxiety.  No memory loss. ? ?Objective:  ? Physical Exam ?General- Alert, Oriented, Affect-appropriate, Distress- none acute.  ?Skin-   no obvious rash ?Lymphadenopathy- none ?Head- atraumatic ?           Eyes- Gross vision intact, PERRLA, conjunctivae clear secretions ?           Ears- Hearing, canals normal ?           Nose- Clear, No-Septal dev, mucus, polyps, erosion, perforation  ?           Throat- Mallampati II , mucosa-clear , drainage- none, tonsils present.         ?            Neck- flexible , trachea midline, no stridor , thyroid nl, carotid no bruit ?Chest - symmetrical excursion , unlabored ?          Heart/CV- RRR , no murmur , no gallop  , no rub, nl s1 s2 ?                          - JVD- none , edema- none, stasis changes- none, varices- none ?          Lung-   Cough-none, +trace squeaks, unlabored, wheeze- none,  dullness-none, rub- none ?          Chest wall-  ?Abd-  ?Br/ Gen/ Rectal- Not done, not indicated ?Extrem- cyanosis- none, clubbing, none, atrophy- none, strength- nl ?Neuro- grossly intact to observation ? ? ? ? ? ? ?

## 2021-12-28 ENCOUNTER — Ambulatory Visit: Payer: BC Managed Care – PPO | Admitting: Family

## 2021-12-28 ENCOUNTER — Ambulatory Visit (INDEPENDENT_AMBULATORY_CARE_PROVIDER_SITE_OTHER): Payer: BC Managed Care – PPO | Admitting: Internal Medicine

## 2021-12-28 ENCOUNTER — Encounter: Payer: Self-pay | Admitting: Internal Medicine

## 2021-12-28 VITALS — BP 120/72 | HR 104 | Temp 98.3°F | Ht 63.0 in | Wt 187.0 lb

## 2021-12-28 DIAGNOSIS — D869 Sarcoidosis, unspecified: Secondary | ICD-10-CM

## 2021-12-28 DIAGNOSIS — G4734 Idiopathic sleep related nonobstructive alveolar hypoventilation: Secondary | ICD-10-CM | POA: Diagnosis not present

## 2021-12-28 DIAGNOSIS — R7303 Prediabetes: Secondary | ICD-10-CM | POA: Diagnosis not present

## 2021-12-28 DIAGNOSIS — I2729 Other secondary pulmonary hypertension: Secondary | ICD-10-CM

## 2021-12-28 LAB — COMPREHENSIVE METABOLIC PANEL
ALT: 33 U/L (ref 0–35)
AST: 25 U/L (ref 0–37)
Albumin: 3.7 g/dL (ref 3.5–5.2)
Alkaline Phosphatase: 30 U/L — ABNORMAL LOW (ref 39–117)
BUN: 17 mg/dL (ref 6–23)
CO2: 25 mEq/L (ref 19–32)
Calcium: 8.7 mg/dL (ref 8.4–10.5)
Chloride: 101 mEq/L (ref 96–112)
Creatinine, Ser: 0.76 mg/dL (ref 0.40–1.20)
GFR: 90.29 mL/min (ref >60.00)
Glucose, Bld: 116 mg/dL — ABNORMAL HIGH (ref 70–99)
Potassium: 3.5 mEq/L (ref 3.5–5.1)
Sodium: 132 mEq/L — ABNORMAL LOW (ref 135–145)
Total Bilirubin: 0.2 mg/dL (ref 0.2–1.2)
Total Protein: 7.8 g/dL (ref 6.0–8.3)

## 2021-12-28 MED ORDER — PREDNISONE 10 MG PO TABS: ORAL_TABLET | ORAL | 1 refills | Status: DC

## 2021-12-28 NOTE — Patient Instructions (Addendum)
Order- lab- Angiotensin Converting Enzyme level, CMET     dx sarcoid ? ?Order- schedule HRCT chest in 3 months, no contrast   dx Sarcoid, hemoptysis ? ?Order- overnight oximetry on room air     dx Nocturnal Hypoxemia ? ?Script sent for prednisone to maintain on 10 mg daily x 1 month, then 10 mg every other day ?

## 2021-12-28 NOTE — Assessment & Plan Note (Signed)
Suspected, based on enlarged pulmonary arteries on CT cardiac scoring study. ?Plan-overnight oximetry ?

## 2021-12-28 NOTE — Assessment & Plan Note (Signed)
Minor hemoptysis is common with minimal trauma to chronic fibrotic scarring from sarcoid, even if the disease is no longer active.  We need to look for evidence of ongoing activity. ?Plan-ACE level and chemistry.  High-resolution CT in 3 months for comparison.  Prednisone 10 mg daily for 1 month and then every other day until return visit. ?

## 2021-12-29 ENCOUNTER — Encounter: Payer: Self-pay | Admitting: Family

## 2021-12-29 LAB — ANGIOTENSIN CONVERTING ENZYME: Angiotensin-Converting Enzyme: 48 U/L (ref 9–67)

## 2021-12-30 ENCOUNTER — Other Ambulatory Visit: Payer: Self-pay | Admitting: Family

## 2021-12-30 DIAGNOSIS — F419 Anxiety disorder, unspecified: Secondary | ICD-10-CM

## 2022-01-03 DIAGNOSIS — R0683 Snoring: Secondary | ICD-10-CM | POA: Diagnosis not present

## 2022-01-03 DIAGNOSIS — G4730 Sleep apnea, unspecified: Secondary | ICD-10-CM | POA: Diagnosis not present

## 2022-01-04 ENCOUNTER — Encounter: Payer: Self-pay | Admitting: Family

## 2022-01-07 ENCOUNTER — Encounter: Payer: Self-pay | Admitting: Family

## 2022-01-11 ENCOUNTER — Ambulatory Visit (INDEPENDENT_AMBULATORY_CARE_PROVIDER_SITE_OTHER): Payer: BC Managed Care – PPO | Admitting: Cardiovascular Disease

## 2022-01-11 ENCOUNTER — Ambulatory Visit (INDEPENDENT_AMBULATORY_CARE_PROVIDER_SITE_OTHER): Payer: BC Managed Care – PPO

## 2022-01-11 ENCOUNTER — Encounter: Payer: Self-pay | Admitting: Cardiovascular Disease

## 2022-01-11 DIAGNOSIS — E782 Mixed hyperlipidemia: Secondary | ICD-10-CM

## 2022-01-11 DIAGNOSIS — R000 Tachycardia, unspecified: Secondary | ICD-10-CM | POA: Diagnosis not present

## 2022-01-11 NOTE — Patient Instructions (Signed)
Medication Instructions:  ?The current medical regimen is effective;  continue present plan and medications. ? ?*If you need a refill on your cardiac medications before your next appointment, please call your pharmacy* ? ? ?Testing/Procedures: ?ZIO XT- Long Term Monitor Instructions ? ?Your physician has requested you wear a ZIO patch monitor for 14 days.  ?This is a single patch monitor. [employer] supplies one patch monitor per enrollment. Additional ?stickers are not available. Please do not apply patch if you will be having a Nuclear Stress Test,  ?Echocardiogram, Cardiac CT, MRI, or Chest Xray during the period you would be wearing the  ?monitor. The patch cannot be worn during these tests. You cannot remove and re-apply the  ?ZIO XT patch monitor.  ?Your ZIO patch monitor will be mailed 3 day USPS to your address on file. It may take 3-5 days  ?to receive your monitor after you have been enrolled.  ?Once you have received your monitor, please review the enclosed instructions. Your monitor  ?has already been registered assigning a specific monitor serial # to you. ? ?Billing and Patient Assistance Program Information ? ?We have supplied [employer] with any of your insurance information on file for billing purposes. ?[employer] offers a sliding scale Patient Assistance Program for patients that do not have  ?insurance, or whose insurance does not completely cover the cost of the ZIO monitor.  ?You must apply for the Patient Assistance Program to qualify for this discounted rate.  ?To apply, please call [employer] at [phone], select option 4, select option 2, ask to apply for  ?Patient Assistance Program. Theodore Demark will ask your household income, and how many people  ?are in your household. They will quote your out-of-pocket cost based on that information.  ?[employer] will also be able to set up a 100-month interest-free payment plan if needed. ? ?Applying the monitor ?  ?Shave hair from upper left chest.  ?Hold abrader  disc by orange tab. Rub abrader in 40 strokes over the upper left chest as  ?indicated in your monitor instructions.  ?Clean area with 4 enclosed alcohol pads. Let dry.  ?Apply patch as indicated in monitor instructions. Patch will be placed under collarbone on left  ?side of chest with arrow pointing upward.  ?Rub patch adhesive wings for 2 minutes. Remove white label marked "1". Remove the white  ?label marked "2". Rub patch adhesive wings for 2 additional minutes.  ?While looking in a mirror, press and release button in center of patch. A small green light will  ?flash 3-4 times. This will be your only indicator that the monitor has been turned on.  ?Do not shower for the first 24 hours. You may shower after the first 24 hours.  ?Press the button if you feel a symptom. You will hear a small click. Record Date, Time and  ?Symptom in the Patient Logbook.  ?When you are ready to remove the patch, follow instructions on the last 2 pages of Patient  ?Logbook. Stick patch monitor onto the last page of Patient Logbook.  ?Place Patient Logbook in the blue and white box. Use locking tab on box and tape box closed  ?securely. The blue and white box has prepaid postage on it. Please place it in the mailbox as  ?soon as possible. Your physician should have your test results approximately 7 days after the  ?monitor has been [facility]  ?Call [facility] [phone] you have questions regarding  ?your ZIO XT  patch monitor. Call them immediately if you see an orange light blinking on your  ?monitor.  ?If your monitor falls off in less than 4 days, contact our Monitor department at [phone].  ?If your monitor becomes loose or falls off after 4 days call [employer] at [phone] for  ?suggestions on securing your monitor  ? ? ?Follow-Up: ?At [facility], you and your health needs are our priority.  As part of our continuing mission to provide you with exceptional heart care, we  have created designated Provider Care Teams.  These Care Teams include your primary Cardiologist (physician) and Advanced Practice Providers (APPs -  Physician Assistants and Nurse Practitioners) who all work together to provide you with the care you need, when you need it. ? ?We recommend signing up for the patient portal called "MyChart".  Sign up information is provided on this After Visit Summary.  MyChart is used to connect with patients for Virtual Visits (Telemedicine).  Patients are able to view lab/test results, encounter notes, upcoming appointments, etc.  Non-urgent messages can be sent to your provider as well.   ?To learn more about what you can do with MyChart, go to [email or web address].   ? ?Your next appointment:   ?6 month(s) ? ?The format for your next appointment:   ?In Person ? ?Provider:   ?Nanetta Batty, MD  ? ? ? ? ? ? ? ? ?

## 2022-01-11 NOTE — Progress Notes (Unsigned)
Enrolled for [employer] to mail a ZIO XT long term holter monitor to the patients address on file.  

## 2022-01-11 NOTE — Assessment & Plan Note (Signed)
History of sinus tachycardia with normal thyroid function test.  She is asymptomatic from this.  I suspect this is related to sarcoidosis involving her myocardium.  I am going to get a 2-week Zio patch to evaluate the range of her heart rates. ?

## 2022-01-11 NOTE — Progress Notes (Signed)
? ? ? ?01/11/2022 ?Nancy Villarreal   ?1971-05-06  ?[id number] ? ?Primary Physician Olive Bass, FNP ?Primary Cardiologist: Runell Gess MD Nicholes Calamity, [place] ? ?HPI:  Nancy Villarreal is a 51 y.o.   mild to moderately overweight married African-American female mother of 2, grandmother of 4 grandchildren referred by Ria Clock, NP for evaluation of resting tachycardia.  She is a Psychologist, occupational at [employer].  I last saw her in the office 10/12/2021.  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 Maple Hudson, pulmonology.  She basically has no cardiac risk factors except for mild untreated hyperlipidemia.  There is no family history for heart disease.  She is never had a heart attack or stroke.  She denies chest pain but does get some shortness of breath primarily related to her sarcoidosis.  She is noted to have a resting sinus tachycardia although she denies palpitations.  Her thyroid function tests are normal. ? ?Since I saw her 3 months ago I did get a 2D echocardiogram on her and that showed normal LV systolic function with hypokinesia and basal mid anteroseptal wall and inferoseptal wall performed 11/18/2021.  As result of this I did get a coronary calcium score which was 0.  I suspect that her regional wall motion abnormalities may be related to sarcoidosis involvement of her myocardium.  She also has a resting sinus tachycardia with which probably is related to this as well.  Her sarcoid has become symptomatic recently with hemoptysis.  She is been put back on steroids and is followed by Dr. Maple Hudson, her pulmonologist. ?  ? ? ?Current Meds  ?Medication Sig  ? iron polysaccharides (NIFEREX) 150 MG capsule Take 1 capsule (150 mg total) by mouth daily.  ? JUNEL FE 1.5/30 1.5-30 MG-MCG tablet Take 1 tablet by mouth daily.  ? predniSONE (DELTASONE) 10 MG tablet 1 daily x 30 days then one every other day  ? venlafaxine XR (EFFEXOR-XR) 75 MG 24 hr capsule TAKE 2  CAPSULES(150 MG) BY MOUTH DAILY WITH BREAKFAST  ?  ? ?Allergies  ?Allergen Reactions  ? Hydrocodone Nausea And Vomiting  ? ? ?Social History  ? ?Socioeconomic History  ? Marital status: Married  ?  Spouse name: Not on file  ? Number of children: Not on file  ? Years of education: Not on file  ? Highest education level: Not on file  ?Occupational History  ? Occupation: Firefighter  ?Tobacco Use  ? Smoking status: Former  ?  Packs/day: 0.25  ?  Years: 10.00  ?  Pack years: 2.50  ?  Types: Cigarettes  ?  Start date: 2001  ?  Quit date: 08/05/2008  ?  Years since quitting: 13.4  ? Smokeless tobacco: Never  ?Vaping Use  ? Vaping Use: Never used  ?Substance and Sexual Activity  ? Alcohol use: Yes  ? Drug use: Never  ? Sexual activity: Not Currently  ?  Birth control/protection: Surgical  ?Other Topics Concern  ? Not on file  ?Social History Narrative  ? ** Merged History Encounter **  ?    ? ?Social Determinants of Health  ? ?Financial Resource Strain: Not on file  ?Food Insecurity: Not on file  ?Transportation Needs: Not on file  ?Physical Activity: Not on file  ?Stress: Not on file  ?Social Connections: Not on file  ?Intimate Partner Violence: Not on file  ?  ? ?Review of Systems: ?General: negative  for chills, fever, night sweats or weight changes.  ?Cardiovascular: negative for chest pain, dyspnea on exertion, edema, orthopnea, palpitations, paroxysmal nocturnal dyspnea or shortness of breath ?Dermatological: negative for rash ?Respiratory: negative for cough or wheezing ?Urologic: negative for hematuria ?Abdominal: negative for nausea, vomiting, diarrhea, bright red blood per rectum, melena, or hematemesis ?Neurologic: negative for visual changes, syncope, or dizziness ?All other systems reviewed and are otherwise negative except as noted above. ? ? ? ?Blood pressure 120/82, pulse (!) 104, height 5\' 3"  (1.6 m), weight 183 lb (83 kg).  ?General appearance: alert and no distress ?Neck: no adenopathy, no carotid bruit,  no JVD, supple, symmetrical, trachea midline, and thyroid not enlarged, symmetric, no tenderness/mass/nodules ?Lungs: clear to auscultation bilaterally ?Heart: regular rate and rhythm, S1, S2 normal, no murmur, click, rub or gallop ?Extremities: extremities normal, atraumatic, no cyanosis or edema ?Pulses: 2+ and symmetric ?Skin: Skin color, texture, turgor normal. No rashes or lesions ?Neurologic: Grossly normal ? ?EKG sinus tachycardia at 104 without ST or T wave changes.  Personally reviewed this EKG. ?Sinus tachycardia at 104 without ST or T wave changes.  Personally reviewed this EKG. ?ASSESSMENT AND PLAN:  ? ?Sinus tachycardia ?History of sinus tachycardia with normal thyroid function test.  She is asymptomatic from this.  I suspect this is related to sarcoidosis involving her myocardium.  I am going to get a 2-week Zio patch to evaluate the range of her heart rates. ? ?Hyperlipidemia ?Mild hyperlipidemia with an LDL of 113 performed 01/13/2021.  Her HDL was 92.  Based on this I did add coronary calcium score which was 0 as well as a 2D echo that showed normal LV systolic function globally with some regional wall motion abnormalities.  I suspect she has sarcoid involvement in her myocardium as well although I do not feel compelled to get a cardiac MRI to confirm this since this would not change my therapy. ? ? ? ? ?03/15/2021 MD FACP,FACC,FAHA, FSCAI ?01/11/2022 ?9:29 AM ?

## 2022-01-11 NOTE — Assessment & Plan Note (Signed)
Mild hyperlipidemia with an LDL of 113 performed 01/13/2021.  Her HDL was 92.  Based on this I did add coronary calcium score which was 0 as well as a 2D echo that showed normal LV systolic function globally with some regional wall motion abnormalities.  I suspect she has sarcoid involvement in her myocardium as well although I do not feel compelled to get a cardiac MRI to confirm this since this would not change my therapy. ?

## 2022-01-14 ENCOUNTER — Encounter: Payer: Self-pay | Admitting: Internal Medicine

## 2022-01-14 ENCOUNTER — Encounter: Payer: Self-pay | Admitting: Cardiovascular Disease

## 2022-01-14 NOTE — Telephone Encounter (Signed)
Will keep look out for [place] Paperwork for short term disability for patient. Will give to [name] when received. Nothing further needed at this time  ?

## 2022-01-17 NOTE — Telephone Encounter (Signed)
Pt has come to the office to sign her paperwork. I have received the signed paperwork and faxed them to the [facility] and [employer]. A copy has been placed in the scan bin. I have gotten confirmation that both forms have gone through.  ? ?[employer]  ?Fax: [phone] ? ?[employer]  ?Fax: [phone] ?

## 2022-01-19 DIAGNOSIS — R000 Tachycardia, unspecified: Secondary | ICD-10-CM | POA: Diagnosis not present

## 2022-01-20 ENCOUNTER — Telehealth: Payer: Self-pay | Admitting: Internal Medicine

## 2022-01-20 NOTE — Telephone Encounter (Signed)
Received disability forms from [facility], effective date to be 01/03/2022. I called patient and advised her of $29 fee and she was agreeable to paying it once forms are complete.  Sent form back to Dr. Maple Hudson for completion.  Included a copy of work restriction form Dr. Maple Hudson completed for the patient on 10/20/2020.

## 2022-01-21 ENCOUNTER — Telehealth: Payer: Self-pay | Admitting: *Deleted

## 2022-01-21 NOTE — Telephone Encounter (Signed)
Prior auth started via cover my meds.  Awaiting determination.  Key: [id number]

## 2022-01-21 NOTE — Telephone Encounter (Signed)
Forms done- CPod outbox

## 2022-01-24 ENCOUNTER — Encounter: Payer: Self-pay | Admitting: Cardiovascular Disease

## 2022-01-25 ENCOUNTER — Encounter: Payer: Self-pay | Admitting: *Deleted

## 2022-01-25 NOTE — Telephone Encounter (Signed)
Message from plan: Effective from 01/21/2022 through 05/26/2022. Please note: This medication is to be titrated up in strength every 4 weeks as tolerated by the member. The quantity limit set of 4 pens per 180 days allows for this titration through all strengths using 4 pens of each strength every 28 days.

## 2022-01-25 NOTE — Telephone Encounter (Signed)
Signed disability form faxed to [facility] fax# [phone], included 12/28/21 office visit notes.  Called patient and left VM and mailed hard copy of signed form.

## 2022-01-27 DIAGNOSIS — R7303 Prediabetes: Secondary | ICD-10-CM | POA: Diagnosis not present

## 2022-01-31 DIAGNOSIS — R000 Tachycardia, unspecified: Secondary | ICD-10-CM | POA: Diagnosis not present

## 2022-02-01 ENCOUNTER — Encounter: Payer: Self-pay | Admitting: Family

## 2022-02-14 ENCOUNTER — Ambulatory Visit: Payer: BC Managed Care – PPO | Admitting: Internal Medicine

## 2022-02-19 ENCOUNTER — Other Ambulatory Visit: Payer: Self-pay | Admitting: Family

## 2022-02-21 ENCOUNTER — Telehealth: Payer: Self-pay | Admitting: Family

## 2022-02-21 NOTE — Telephone Encounter (Signed)
Please contact pt and ask her how she is tolerating current Wegovy dose. Any side effects? What is her most recent weight?

## 2022-02-25 NOTE — Telephone Encounter (Signed)
Can you please contact this patient.

## 2022-02-27 DIAGNOSIS — R7303 Prediabetes: Secondary | ICD-10-CM | POA: Diagnosis not present

## 2022-02-28 MED ORDER — WEGOVY 0.5 MG/0.5ML ~~LOC~~ SOAJ: 0.5000 mg | SUBCUTANEOUS | 0 refills | Status: DC

## 2022-03-11 ENCOUNTER — Ambulatory Visit (INDEPENDENT_AMBULATORY_CARE_PROVIDER_SITE_OTHER): Payer: BC Managed Care – PPO | Admitting: Family

## 2022-03-11 VITALS — BP 120/80 | HR 102 | Temp 98.8°F

## 2022-03-11 DIAGNOSIS — F419 Anxiety disorder, unspecified: Secondary | ICD-10-CM | POA: Diagnosis not present

## 2022-03-11 DIAGNOSIS — I2729 Other secondary pulmonary hypertension: Secondary | ICD-10-CM | POA: Diagnosis not present

## 2022-03-11 DIAGNOSIS — D869 Sarcoidosis, unspecified: Secondary | ICD-10-CM

## 2022-03-11 DIAGNOSIS — R0600 Dyspnea, unspecified: Secondary | ICD-10-CM

## 2022-03-11 MED ORDER — VENLAFAXINE HCL ER 150 MG PO CP24: 150.0000 mg | ORAL_CAPSULE | Freq: Every day | ORAL | 3 refills | Status: DC

## 2022-03-11 NOTE — Progress Notes (Signed)
Nancy Villarreal is a 51 y.o. female with the following history as recorded in EpicCare:  Patient Active Problem List   Diagnosis Date Noted   Other secondary pulmonary hypertension (HCC) 12/28/2021   Sinus tachycardia 10/12/2021   Hyperlipidemia 10/12/2021   Acute bronchitis 11/16/2020   Rash and nonspecific skin eruption 09/18/2020   Upper respiratory infection, acute 06/10/2017   Bilateral hand pain 01/13/2017   Pain, joint, shoulder, left 01/13/2017   Pain in left ankle and joints of left foot 01/13/2017   High risk medication use 01/13/2017   Nausea 08/09/2016   Cough 03/09/2016   Obesity (BMI 30-39.9) 01/22/2016   Right foot pain 01/21/2016   Routine general medical examination at a health care facility 12/11/2013   Visit for screening mammogram 03/07/2012   Anemia, iron deficiency 02/10/2012   Sarcoidosis (HCC) 09/17/2008   Asthma in remission 09/03/2008   Allergic rhinitis due to pollen 09/03/2008    Current Outpatient Medications  Medication Sig Dispense Refill   iron polysaccharides (NIFEREX) 150 MG capsule Take 1 capsule (150 mg total) by mouth daily. 90 capsule 1   JUNEL FE 1.5/30 1.5-30 MG-MCG tablet Take 1 tablet by mouth daily.     predniSONE (DELTASONE) 10 MG tablet 1 daily x 30 days then one every other day 60 tablet 1   Semaglutide-Weight Management (WEGOVY) 0.5 MG/0.5ML SOAJ Inject 0.5 mg into the skin once a week. 2 mL 0   venlafaxine XR (EFFEXOR-XR) 150 MG 24 hr capsule Take 1 capsule (150 mg total) by mouth daily with breakfast. 90 capsule 3   No current facility-administered medications for this visit.    Allergies: Hydrocodone  Past Medical History:  Diagnosis Date   Abnormal chest x-ray    Anemia    Hemoptysis    Sarcoid    bronch negative 09-2008 ACE 93   Unspecified asthma(493.90)     Past Surgical History:  Procedure Laterality Date   BRONCHOSCOPY  09-2008   Negative   CESAREAN SECTION     TUBAL LIGATION     1995    Family History   Problem Relation Age of Onset   Asthma Paternal Grandfather    Lung cancer Maternal Grandfather    Hyperlipidemia Mother    Hypertension Mother    Cancer Mother        lung    Diabetes Father    Hyperlipidemia Maternal Grandmother    Stroke Maternal Grandmother    Cancer Paternal Grandmother    Alcohol abuse Paternal Grandmother    Early death Paternal Grandmother    Healthy Son    Healthy Daughter     Social History   Tobacco Use   Smoking status: Former    Packs/day: 0.25    Years: 10.00    Total pack years: 2.50    Types: Cigarettes    Start date: 2001    Quit date: 08/05/2008    Years since quitting: 13.6   Smokeless tobacco: Never  Substance Use Topics   Alcohol use: Yes    Subjective:   Accompanied by husband and granddaughter; requesting completion of Short term disability forms; does not feel that her health is going to allow her to return to work as we had hoped and is now looking at long term disability;  she is scheduled for repeat Ct with pulmonology for later this month and then a follow up to consider treatment options for the sarcoidosis; Continuing to feel tired/ sluggish/ hot and has had some intermittent headaches;  Objective:  Vitals:   03/11/22 1418  BP: 120/80  Pulse: (!) 102  Temp: 98.8 F (37.1 C)  TempSrc: Oral  SpO2: 97%    General: Well developed, well nourished, in no acute distress  Skin : Warm and dry.  Head: Normocephalic and atraumatic  Lungs: Respirations unlabored;  Neurologic: Alert and oriented; speech intact; face symmetrical; moves all extremities well; CNII-XII intact without focal deficit   Assessment:  1. Sarcoidosis (HCC)   2. Dyspnea, unspecified type   3. Other secondary pulmonary hypertension (HCC)   4. Anxiety     Plan:  Agree with decision not to return to work; forms are completed as requested; she will discuss long term disability with her HR as well.  Refill updated on Effexor XR today;   No follow-ups  on file.  No orders of the defined types were placed in this encounter.   Requested Prescriptions   Signed Prescriptions Disp Refills   venlafaxine XR (EFFEXOR-XR) 150 MG 24 hr capsule 90 capsule 3    Sig: Take 1 capsule (150 mg total) by mouth daily with breakfast.

## 2022-03-22 ENCOUNTER — Encounter: Payer: Self-pay | Admitting: Internal Medicine

## 2022-03-22 ENCOUNTER — Telehealth: Payer: Self-pay | Admitting: Internal Medicine

## 2022-03-22 ENCOUNTER — Encounter: Payer: Self-pay | Admitting: Family

## 2022-03-22 NOTE — Telephone Encounter (Signed)
Called and spoke to pt. Pt states she woke up this morning around 0720 and coughed up bright red blood. Pt states she coughed for about 10 minutes and collected about 2 tablespoons of just brb, no mucus. Pt also states just after the hemoptysis she began to have substernal chest discomfort and a headache. Pt states she took 400mg  of Advil and the pain subsided. Pt states when she would walk around the cough and chest discomfort would return, resolve with rest. Pt states this is not the first time this has happened. Pt states the last hemoptysis episode was around the last time she was seen in the office (12/28/21). While on the phone her spo2 was 97% on RA, pt denied any distress and pt sounded calm and relaxed while on phone. Pt denied any other respiratory symptoms. There are no current openings for the clinic until Monday 7/24. Pt has HRCT scheduled for 7/21.    Dr. 8/21, please advise. Thanks.

## 2022-03-22 NOTE — Telephone Encounter (Signed)
Spoke with the pt and notified of response per [name]. She denies any continued hemoptysis. OV with CDY for 03/28/22 at 4:30 pm. She is aware to call sooner or seek emergent care sooner if needed.

## 2022-03-22 NOTE — Telephone Encounter (Signed)
If the bleeding has now stopped, we don't need to do anything before her upcoming chest CT. I would like to see her back as soon after her CT results as we can schedule- ok to use a held spot.

## 2022-03-22 NOTE — Telephone Encounter (Signed)
Lmtcb for pt.  

## 2022-03-23 ENCOUNTER — Encounter: Payer: Self-pay | Admitting: Internal Medicine

## 2022-03-23 DIAGNOSIS — Z6833 Body mass index (BMI) 33.0-33.9, adult: Secondary | ICD-10-CM | POA: Diagnosis not present

## 2022-03-23 DIAGNOSIS — Z01419 Encounter for gynecological examination (general) (routine) without abnormal findings: Secondary | ICD-10-CM | POA: Diagnosis not present

## 2022-03-23 MED ORDER — PREDNISONE 10 MG PO TABS: ORAL_TABLET | ORAL | 0 refills | Status: AC

## 2022-03-23 NOTE — Telephone Encounter (Signed)
Mychart message sent by pt:  Another incident, same as before, chest pain, shortness of breath, headache....pain both shoulders this time. Minimal movement, rested most of the day.  Took 40mg  of prednisone     Dr. , please advise what you recommend.

## 2022-03-23 NOTE — Telephone Encounter (Signed)
Her CT is scheduled for Friday- important to keep that. Doubt we can get it moved up. Agree with prednisone. Let's send her enough for taper :             Prednisone 10 mg, # 20, 4 X 2 DAYS, 3 X 2 DAYS, 2 X 2 DAYS, 1 X 2 DAYS If she gets worse before CT, go to ER

## 2022-03-25 ENCOUNTER — Ambulatory Visit (HOSPITAL_COMMUNITY)
Admission: RE | Admit: 2022-03-25 | Discharge: 2022-03-25 | Disposition: A | Discharge status: Home / Self Care | Payer: BC Managed Care – PPO | Source: Ambulatory Visit | Attending: Internal Medicine | Admitting: Internal Medicine

## 2022-03-25 DIAGNOSIS — J8410 Pulmonary fibrosis, unspecified: Secondary | ICD-10-CM | POA: Insufficient documentation

## 2022-03-25 DIAGNOSIS — J84112 Idiopathic pulmonary fibrosis: Secondary | ICD-10-CM | POA: Insufficient documentation

## 2022-03-25 DIAGNOSIS — I289 Disease of pulmonary vessels, unspecified: Secondary | ICD-10-CM | POA: Insufficient documentation

## 2022-03-25 DIAGNOSIS — R918 Other nonspecific abnormal finding of lung field: Secondary | ICD-10-CM | POA: Insufficient documentation

## 2022-03-25 DIAGNOSIS — J929 Pleural plaque without asbestos: Secondary | ICD-10-CM | POA: Diagnosis not present

## 2022-03-25 DIAGNOSIS — I700 Atherosclerosis of aorta: Secondary | ICD-10-CM | POA: Insufficient documentation

## 2022-03-25 DIAGNOSIS — D869 Sarcoidosis, unspecified: Secondary | ICD-10-CM | POA: Diagnosis not present

## 2022-03-27 NOTE — Progress Notes (Signed)
Patient ID: Nancy Villarreal. [name], female    DOB: 1970-09-28, 51 y.o.   MRN: [id number]  HPI F former smoker followed here with sarcoid Stage III,  Bronch NEG 09/24/08- dx based on mediastinal adenopathy/ interstitial disease,chronic elev ACE , response to steroid. Complicated by Iron def anemia, plasma cell disorder. Bronch NEG 09/24/08- dx based on mediastinal adenopathy/ interstitial disease, ACE 90, response to steroids. PFT 10/11/16- ACE 09/30/16- 86H  ACE 12/07/17- 97, up from 89 in 2018 ACE 01/08/19- 67 WNL ACE 08/04/20- 80H Immunoglobulins 12/19/16- IgG 2,669 ----------------------------------------------------------------------------------------------------   12/28/21- 51yoF former smoker followed here with Sarcoid Stage III ( Bronch NEG 09/24/08- dx based on mediastinal adenopathy/ interstitial disease, chronic elevation of ACE, response to steroid), Hemoptysis, Bronchitis, Complicated by Iron def anemia, plasma cell disorder -Albuterol hfa,  Short term Disability -status? Covid vax- 5 Phizer Flu vax- had ZPak and prednisone started 4/20 by PCP. Has had 3 episodes in past 6 months attributed to Sarcoid flares. ACE 1 yr ago 74, but mostly elevated over past 5 years.  ------Patient states that she is still coughing off and on throughout the day. States that before she got here she coughed up some blood. Patient feels like breathing is worse since last visit.  Has coughed up a teaspoon amounts of blood twice in the last couple of weeks.  Denies fever, adenopathy, rash.  Dyspnea on unusual exertion not interfering with ADLs.  Basic exercise tolerance stable.  Having some cardiology work-up related to tachycardia, leading to CT cardiac scoring study.  Pulmonary results of this were reviewed with her today.  Note evidence of pulmonary hypertension She is older than most patients with active pulmonary sarcoid but noted to have sustained elevated ACE levels over the last several years.  Coming off of  methotrexate may have disinhibited sarcoid activity. CT Cardiac scoring 11/18/21(lung component) IMPRESSION: 1. Spectrum of extensive pulmonary parenchymal findings most compatible with pulmonary sarcoidosis, as detailed, progressive since 10/27/2016 chest CT. Suggest dedicated follow-up high-resolution chest CT study in 3-6 months. 2. Dilated main pulmonary artery, suggesting pulmonary arterial hypertension. 3. Stable calcified mediastinal and bilateral hilar lymph nodes, compatible with sarcoidosis. CXR 08/12/21-  IMPRESSION: Similar appearance of the chest x-ray, with bilateral reticulonodular opacities compatible with chronic interstitial disease/fibrosis. No definite evidence of superimposed acute cardiopulmonary disease  03/28/22-  51yoF former smoker(2.5 pk yrs) followed here with Sarcoid Stage III ( Bronch NEG 09/24/08- dx based on mediastinal adenopathy/ interstitial disease, chronic elevation of ACE, response to steroid), Hemoptysis, Bronchitis, Complicated by Iron def anemia, plasma cell disorder -Albuterol hfa,  Short term Disability -status? Covid vax- 5 Phizer Flu vax- had Has asked PCP for [employer] for Sarcoid- Had self-limited recurrence of hemoptysis July 18> 2 tbsp, chest pain, HA,> prednisone taper Lab 4/25- ACE 48 wnl Now back on maintenance prednisone 10 mg alt days. CT reviewed. Only light cough. Denies fever, nodes, rash. HRCT chest 03/25/22-   IMPRESSION: 1. Moderate pulmonary fibrosis, with an upper lobe and perihilar predominance, characterized by fine nodular and confluent consolidative opacity and architectural distortion, with many nodules concentrated along the fissures and pleura (i.e. perilymphatic distribution). If characterized by ATS pulmonary fibrosis criteria, findings are in an alternative diagnosis (not UIP) pattern, and specifically highly suggestive of pulmonary sarcoidosis with some evidence of developing mass of  fibrosis: Diagnosis of Idiopathic Pulmonary Fibrosis: An Official ATS/ERS/JRS/ALAT Clinical Practice Guideline. Am Rosezetta Schlatter Crit Care Med Vol 198, Iss 5, [phone], May 06 2017. 2. Numerous coarsely calcified  mediastinal and bilateral hilar lymph nodes, consistent with nodal sarcoidosis. 3. Lobular air trapping on expiratory phase imaging, suggestive of small airways disease. 4. Enlargement of the main pulmonary artery, as can be seen in pulmonary hypertension. Aortic Atherosclerosis (ICD10-I70.0). ECHO 11/18/21-(Dr Berry)"Normal LV systolic function with some mild wall motion abnormalities and mildly elevated pulmonary artery pressures.  Overall fairly normal 2D echo."   Review of Systems-see HPI   + = positive HEENT:   No headaches,  Difficulty swallowing,  Tooth/dental problems,  Sore throat,                No sneezing, itching, ear ache, nasal congestion, post nasal drip,  CV:  No- chest pain,  No-PND, swelling in lower extremities, anasarca, dizziness, palpitations GI  No heartburn, indigestion, abdominal pain, nausea, vomiting,  Resp:  No acute shortness of breath with exertion .  No excess mucus, +productive cough,                  non-productive cough,  +recent coughing up of blood.  No change in color of mucus.  No wheezing.  Skin: no rash or lesions. GU: MS: joint pain .  No decreased range of motion.  No back pain. Psych:  No change in mood or affect. No depression or anxiety.  No memory loss.  Objective:   Physical Exam General- Alert, Oriented, Affect-appropriate, Distress- none acute.  Skin-   no obvious rash Lymphadenopathy- none Head- atraumatic            Eyes- Gross vision intact, PERRLA, conjunctivae clear secretions            Ears- Hearing, canals normal            Nose- Clear, No-Septal dev, mucus, polyps, erosion, perforation             Throat- Mallampati II , mucosa-clear , drainage- none, tonsils present.                     Neck- flexible , trachea  midline, no stridor , thyroid nl, carotid no bruit Chest - symmetrical excursion , unlabored           Heart/CV- RRR , no murmur , no gallop  , no rub, nl s1 s2                           - JVD- none , edema- none, stasis changes- none, varices- none           Lung-   Cough-none, +trace squeaks, unlabored, wheeze- none,  dullness-none, rub- none           Chest wall-  Abd-  Br/ Gen/ Rectal- Not done, not indicated Extrem- cyanosis- none, clubbing, none, atrophy- none, strength- nl Neuro- grossly intact to observation

## 2022-03-28 ENCOUNTER — Ambulatory Visit (INDEPENDENT_AMBULATORY_CARE_PROVIDER_SITE_OTHER): Payer: BC Managed Care – PPO | Admitting: Internal Medicine

## 2022-03-28 ENCOUNTER — Encounter: Payer: Self-pay | Admitting: Internal Medicine

## 2022-03-28 DIAGNOSIS — D869 Sarcoidosis, unspecified: Secondary | ICD-10-CM | POA: Diagnosis not present

## 2022-03-28 DIAGNOSIS — R042 Hemoptysis: Secondary | ICD-10-CM | POA: Diagnosis not present

## 2022-03-28 MED ORDER — TRAMADOL HCL 50 MG PO TABS: 50.0000 mg | ORAL_TABLET | Freq: Four times a day (QID) | ORAL | 0 refills | Status: DC | PRN

## 2022-03-28 MED ORDER — AZITHROMYCIN 250 MG PO TABS: ORAL_TABLET | ORAL | 0 refills | Status: DC

## 2022-03-28 NOTE — Patient Instructions (Signed)
Finish your prednisone taper, then take 10 mg once daily x 30 days, then 10 mg every other day.  Prednisone will raise your blood sugar- you will want to discuss this with your primary are provider.  Script sent for Zpak to start now. Hopefully this and the prednisone will stop your bleeding.  Script sent for tramadol - try this instead of Advil for a week for pain and cough.

## 2022-03-29 DIAGNOSIS — R7303 Prediabetes: Secondary | ICD-10-CM | POA: Diagnosis not present

## 2022-03-30 ENCOUNTER — Encounter: Payer: Self-pay | Admitting: Cardiovascular Disease

## 2022-03-30 ENCOUNTER — Encounter: Payer: Self-pay | Admitting: Family

## 2022-03-30 ENCOUNTER — Encounter: Payer: Self-pay | Admitting: Internal Medicine

## 2022-03-31 NOTE — Telephone Encounter (Signed)
Hey we faxed this paperwork on 03-11-2022

## 2022-04-01 ENCOUNTER — Telehealth: Payer: Self-pay | Admitting: Family

## 2022-04-01 NOTE — Telephone Encounter (Signed)
She sent [place] FMLA paperwork which I completed to best of my ability since she is not returning to work; numerous places she needs to sign. Does she want to pick up forms?

## 2022-04-01 NOTE — Telephone Encounter (Signed)
Both FMLA documents sent by patient via MyChart have been received and will be processed in 7-10 business days.  Patient was advised via MyChart of the $29 processing fee.

## 2022-04-04 DIAGNOSIS — Z0279 Encounter for issue of other medical certificate: Secondary | ICD-10-CM

## 2022-04-04 NOTE — Telephone Encounter (Signed)
Spoke with patient and she stated she would sign and send in separately.  She would like for [place] to send in.  Forms sent by email to email provided on form.  Copy sent to scan and copy place at my desk (in yellow bin) until scanned in chart.

## 2022-04-06 ENCOUNTER — Encounter: Payer: Self-pay | Admitting: Internal Medicine

## 2022-04-06 ENCOUNTER — Telehealth: Payer: Self-pay | Admitting: Internal Medicine

## 2022-04-06 ENCOUNTER — Encounter: Payer: Self-pay | Admitting: Family

## 2022-04-06 NOTE — Telephone Encounter (Signed)
I called patient and reviewed questions on FMLA form and continuation of disability form.   She is currently not working and states she is going to file for long term disability.  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  FMLA form was completed to show unknown return to work date and that she needs follow-up appointments every 6 months or as needed for flare-ups.  She requested that I fax the signed forms to [employer] (IT)) and [employer] (disability).   Forms provided to Dr. Maple Hudson for review and signature.

## 2022-04-07 NOTE — Telephone Encounter (Signed)
Signed FMLA/LOA and disability forms were faxed to [employer] fax# [phone].  Called patient and then mailed her a hard copy.

## 2022-04-08 ENCOUNTER — Encounter: Payer: Self-pay | Admitting: Family

## 2022-04-08 ENCOUNTER — Encounter: Payer: Self-pay | Admitting: Internal Medicine

## 2022-04-08 NOTE — Telephone Encounter (Signed)
Nancy Villarreal, please see mychart message from pt about FMLA

## 2022-04-11 NOTE — Telephone Encounter (Signed)
Per MyChart note from patient - FMLA form must have an end date in Question 11.  "Unknown" is not acceptable.  Printed form and sent to Dr. Maple Hudson to update with an end date.

## 2022-04-13 NOTE — Telephone Encounter (Signed)
Dr. Maple Hudson updated the Leave of Absence/FMLA paperwork from [employer] to show a 01/04/2023 end date.  I have faxed the form to [phone] and contacted the patient to let her know it was taken care of.

## 2022-04-15 NOTE — Telephone Encounter (Signed)
I have called pt back and informed her that I have received any paper work. She stated that her disability claim has been approved until 01/04/23 and we should be getting paperwork from [place] and we have not received them yet. Pt stated understanding and FYI to provider.

## 2022-04-18 ENCOUNTER — Telehealth: Payer: Self-pay | Admitting: Family

## 2022-04-18 NOTE — Telephone Encounter (Signed)
Forms faxed into front office ?Placed in murray bin up front ?

## 2022-04-19 NOTE — Telephone Encounter (Signed)
I have received the paperwork from [place]. I have called pt back to get clarification on why we need to fill out another form since we just completed one. Pt reports that she stated a new claim for the same concerns. Her pulmonologist Dr. Joni Fears has written the pt out until 01/04/2023. Dr. Joni Fears has done the LOA for the pt and since Vernona Rieger was the one who initiated the disability paperwork that is why the forms are coming back to the PCP. Please advise on the form. I have completed what I can and placed in the yellow to be completed folder for provider part.

## 2022-04-29 DIAGNOSIS — R7303 Prediabetes: Secondary | ICD-10-CM | POA: Diagnosis not present

## 2022-05-01 ENCOUNTER — Encounter: Payer: Self-pay | Admitting: Internal Medicine

## 2022-05-01 DIAGNOSIS — D869 Sarcoidosis, unspecified: Secondary | ICD-10-CM

## 2022-05-01 DIAGNOSIS — R042 Hemoptysis: Secondary | ICD-10-CM

## 2022-05-02 NOTE — Telephone Encounter (Signed)
Received request from [employer] for office notes from 03/28/22 - faxed to [phone]

## 2022-05-03 ENCOUNTER — Encounter: Payer: Self-pay | Admitting: Internal Medicine

## 2022-05-03 ENCOUNTER — Telehealth: Payer: Self-pay | Admitting: Pulmonary Disease

## 2022-05-03 DIAGNOSIS — R042 Hemoptysis: Secondary | ICD-10-CM

## 2022-05-03 NOTE — Assessment & Plan Note (Signed)
Recurrent small volume, likely due to focal bronchitis in areas of contracture/ scar. If it keeps happening, consider utility of bronch to try to localize. Ultimately may need intervention.

## 2022-05-03 NOTE — Assessment & Plan Note (Signed)
Stage III with ACE recently suppressed now with maintenance pred Plan- treat acute bronchitis/ heme with Zpak, pred. Consider broncch for localization.

## 2022-05-03 NOTE — Telephone Encounter (Signed)
Called and spoke to pt. Pt states she had another episode of hemoptysis. Pt states the hemoptysis presented on 8/27 and she had two occassions and each time she coughed up about 2 tablespoons of bright red blood. Pt states on 8/28 she had 3 coughing episodes which resulted in about a teaspoon each of bright red blood. Pt states today she has only had one episode this morning and was less than a teaspoon of bright red blood. Pt increased her prednisone from 10mg  qd to 40mg  on 8/28, then she went to 30mg  on 8/28, and today she has taken 20mg . Pt denies significant ShOB but states this and her chest discomfort are at baseline for her. Pt states she still has wheezing when she lays down. Pt denies fever.   Dr. 9/28, please advise. Thanks.

## 2022-05-03 NOTE — Telephone Encounter (Signed)
Order- CXR   dx sarcoid, hemoptysis  Order- lab- CBC w diff, PT, PTT, CMET   dx sarcoid, hemoptysis  I have asked my colleague here to review. We may contact Nancy Villarreal about doing a bronchoscopy to see if we can better localize where in the lung the bleeding is coming from.

## 2022-05-03 NOTE — Telephone Encounter (Signed)
[facility] Order  Assisting Dr. Maple Hudson in coordinating bronchoscopy for patient with reported hemoptysis to localize bleeding with airway inspection. I contacted patient regarding plan and she is ok with proceeding.  Order placed with tentative date for 05/06/22 with Dr. Durel Salts.  If unable to schedule for this date, staff should contact me so we can coordinate for the following week if available.  Mechele Collin, M.D. [facility] Pulmonary/Critical Care Medicine 05/03/2022 5:48 PM

## 2022-05-04 ENCOUNTER — Ambulatory Visit (INDEPENDENT_AMBULATORY_CARE_PROVIDER_SITE_OTHER): Payer: BC Managed Care – PPO

## 2022-05-04 ENCOUNTER — Other Ambulatory Visit (INDEPENDENT_AMBULATORY_CARE_PROVIDER_SITE_OTHER): Payer: BC Managed Care – PPO

## 2022-05-04 DIAGNOSIS — R042 Hemoptysis: Secondary | ICD-10-CM

## 2022-05-04 DIAGNOSIS — D869 Sarcoidosis, unspecified: Secondary | ICD-10-CM

## 2022-05-04 LAB — CBC WITH DIFFERENTIAL/PLATELET
Basophils Absolute: 0 10*3/uL (ref 0.0–0.1)
Basophils Relative: 0.2 % (ref 0.0–3.0)
Eosinophils Absolute: 0 10*3/uL (ref 0.0–0.7)
Eosinophils Relative: 0 % (ref 0.0–5.0)
HCT: 36 % (ref 36.0–46.0)
Hemoglobin: 11.8 g/dL — ABNORMAL LOW (ref 12.0–15.0)
Lymphocytes Relative: 19.5 % (ref 12.0–46.0)
Lymphs Abs: 1.1 10*3/uL (ref 0.7–4.0)
MCHC: 32.7 g/dL (ref 30.0–36.0)
MCV: 86.7 fl (ref 78.0–100.0)
Monocytes Absolute: 0.3 10*3/uL (ref 0.1–1.0)
Monocytes Relative: 4.4 % (ref 3.0–12.0)
Neutro Abs: 4.4 10*3/uL (ref 1.4–7.7)
Neutrophils Relative %: 75.9 % (ref 43.0–77.0)
Platelets: 170 10*3/uL (ref 150.0–400.0)
RBC: 4.15 Mil/uL (ref 3.87–5.11)
RDW: 12.5 % (ref 11.5–15.5)
WBC: 5.8 10*3/uL (ref 4.0–10.5)

## 2022-05-04 LAB — PROTIME-INR
INR: 0.9 ratio (ref 0.8–1.0)
Prothrombin Time: 9.7 s (ref 9.6–13.1)

## 2022-05-04 LAB — COMPREHENSIVE METABOLIC PANEL
ALT: 15 U/L (ref 0–35)
AST: 20 U/L (ref 0–37)
Albumin: 3.6 g/dL (ref 3.5–5.2)
Alkaline Phosphatase: 32 U/L — ABNORMAL LOW (ref 39–117)
BUN: 15 mg/dL (ref 6–23)
CO2: 24 mEq/L (ref 19–32)
Calcium: 8.3 mg/dL — ABNORMAL LOW (ref 8.4–10.5)
Chloride: 104 mEq/L (ref 96–112)
Creatinine, Ser: 0.75 mg/dL (ref 0.40–1.20)
GFR: 91.51 mL/min (ref >60.00)
Glucose, Bld: 120 mg/dL — ABNORMAL HIGH (ref 70–99)
Potassium: 4.3 mEq/L (ref 3.5–5.1)
Sodium: 134 mEq/L — ABNORMAL LOW (ref 135–145)
Total Bilirubin: 0.2 mg/dL (ref 0.2–1.2)
Total Protein: 8.2 g/dL (ref 6.0–8.3)

## 2022-05-04 LAB — APTT: aPTT: 25.6 s (ref 25.4–36.8)

## 2022-05-04 NOTE — Telephone Encounter (Signed)
I have called the patient and she is going to come in for a CXR and blood work per Dr. Maple Hudson. She did not have any other questions. Nothing further needed.

## 2022-05-04 NOTE — Telephone Encounter (Signed)
Pt has been scheduled for 9/1 at 8:45 at [facility].  She will get covid test that morning.  Spoke to pt & gave her appt info.  Nothing further needed.

## 2022-05-06 ENCOUNTER — Encounter (HOSPITAL_COMMUNITY): Payer: Self-pay | Admitting: Internal Medicine

## 2022-05-06 ENCOUNTER — Encounter (HOSPITAL_COMMUNITY)
Admission: RE | Disposition: A | Discharge status: Home / Self Care | Payer: Self-pay | Source: Home / Self Care | Attending: Internal Medicine

## 2022-05-06 ENCOUNTER — Ambulatory Visit (HOSPITAL_COMMUNITY): Payer: BC Managed Care – PPO | Admitting: Anesthesiology

## 2022-05-06 ENCOUNTER — Telehealth: Payer: Self-pay | Admitting: Internal Medicine

## 2022-05-06 ENCOUNTER — Other Ambulatory Visit: Payer: Self-pay

## 2022-05-06 ENCOUNTER — Ambulatory Visit (HOSPITAL_COMMUNITY)
Admission: RE | Admit: 2022-05-06 | Discharge: 2022-05-06 | Disposition: A | Discharge status: Home / Self Care | Payer: BC Managed Care – PPO | Attending: Internal Medicine | Admitting: Internal Medicine

## 2022-05-06 DIAGNOSIS — D759 Disease of blood and blood-forming organs, unspecified: Secondary | ICD-10-CM | POA: Insufficient documentation

## 2022-05-06 DIAGNOSIS — D869 Sarcoidosis, unspecified: Secondary | ICD-10-CM | POA: Diagnosis not present

## 2022-05-06 DIAGNOSIS — Z20822 Contact with and (suspected) exposure to covid-19: Secondary | ICD-10-CM | POA: Insufficient documentation

## 2022-05-06 DIAGNOSIS — R042 Hemoptysis: Secondary | ICD-10-CM | POA: Diagnosis not present

## 2022-05-06 DIAGNOSIS — R9389 Abnormal findings on diagnostic imaging of other specified body structures: Secondary | ICD-10-CM | POA: Diagnosis not present

## 2022-05-06 DIAGNOSIS — D860 Sarcoidosis of lung: Secondary | ICD-10-CM | POA: Insufficient documentation

## 2022-05-06 DIAGNOSIS — D649 Anemia, unspecified: Secondary | ICD-10-CM | POA: Diagnosis not present

## 2022-05-06 DIAGNOSIS — Z87891 Personal history of nicotine dependence: Secondary | ICD-10-CM | POA: Diagnosis not present

## 2022-05-06 DIAGNOSIS — J8410 Pulmonary fibrosis, unspecified: Secondary | ICD-10-CM | POA: Diagnosis not present

## 2022-05-06 HISTORY — PX: VIDEO BRONCHOSCOPY: SHX5072

## 2022-05-06 HISTORY — PX: HEMOSTASIS CONTROL: SHX6838

## 2022-05-06 HISTORY — PX: BRONCHIAL WASHINGS: SHX5105

## 2022-05-06 HISTORY — PX: BIOPSY: SHX5522

## 2022-05-06 LAB — SARS CORONAVIRUS 2 BY RT PCR: SARS Coronavirus 2 by RT PCR: NEGATIVE

## 2022-05-06 SURGERY — VIDEO BRONCHOSCOPY WITHOUT FLUORO
Anesthesia: General

## 2022-05-06 MED ORDER — DEXAMETHASONE SODIUM PHOSPHATE 10 MG/ML IJ SOLN
INTRAMUSCULAR | Status: DC | PRN
  Administered 2022-05-06: 10 mg via INTRAVENOUS

## 2022-05-06 MED ORDER — ONDANSETRON HCL 4 MG/2ML IJ SOLN
INTRAMUSCULAR | Status: DC | PRN
  Administered 2022-05-06: 4 mg via INTRAVENOUS

## 2022-05-06 MED ORDER — MIDAZOLAM HCL (PF) 5 MG/ML IJ SOLN
INTRAMUSCULAR | Status: AC
  Filled 2022-05-06: qty 1

## 2022-05-06 MED ORDER — FENTANYL CITRATE (PF) 250 MCG/5ML IJ SOLN
INTRAMUSCULAR | Status: DC | PRN
  Administered 2022-05-06: 100 ug via INTRAVENOUS

## 2022-05-06 MED ORDER — LACTATED RINGERS IV SOLN: INTRAVENOUS | Status: DC | PRN

## 2022-05-06 MED ORDER — SUGAMMADEX SODIUM 200 MG/2ML IV SOLN
INTRAVENOUS | Status: DC | PRN
  Administered 2022-05-06: 340 mg via INTRAVENOUS

## 2022-05-06 MED ORDER — PROPOFOL 10 MG/ML IV BOLUS
INTRAVENOUS | Status: DC | PRN
  Administered 2022-05-06: 180 mg via INTRAVENOUS

## 2022-05-06 MED ORDER — LIDOCAINE 2% (20 MG/ML) 5 ML SYRINGE
INTRAMUSCULAR | Status: DC | PRN
  Administered 2022-05-06: 60 mg via INTRAVENOUS

## 2022-05-06 MED ORDER — ROCURONIUM BROMIDE 10 MG/ML (PF) SYRINGE
PREFILLED_SYRINGE | INTRAVENOUS | Status: DC | PRN
  Administered 2022-05-06: 50 mg via INTRAVENOUS

## 2022-05-06 MED ORDER — FENTANYL CITRATE (PF) 100 MCG/2ML IJ SOLN
INTRAMUSCULAR | Status: AC
  Filled 2022-05-06: qty 2

## 2022-05-06 MED ORDER — MIDAZOLAM HCL 5 MG/5ML IJ SOLN
INTRAMUSCULAR | Status: DC | PRN
Start: 2022-05-06 — End: 2022-05-06
  Administered 2022-05-06: 2 mg via INTRAVENOUS

## 2022-05-06 NOTE — Anesthesia Postprocedure Evaluation (Signed)
Anesthesia Post Note  Patient: Nohealani Medinger [facility]  Procedure(s) Performed: VIDEO BRONCHOSCOPY WITHOUT FLUORO BIOPSY BRONCHIAL WASHINGS HEMOSTASIS CONTROL     Patient location during evaluation: PACU Anesthesia Type: General Level of consciousness: awake and alert, oriented and patient cooperative Pain management: pain level controlled Vital Signs Assessment: post-procedure vital signs reviewed and stable Respiratory status: spontaneous breathing, nonlabored ventilation and respiratory function stable Cardiovascular status: blood pressure returned to baseline and stable Postop Assessment: no apparent nausea or vomiting Anesthetic complications: no   No notable events documented.  Last Vitals:  Vitals:   05/06/22 0735 05/06/22 0955  BP: 132/80   Pulse: 75   Resp: 20   Temp: 36.4 C 36.6 C  SpO2: 98%     Last Pain:  Vitals:   05/06/22 0955  TempSrc:   PainSc: 0-No pain                 Lannie Fields

## 2022-05-06 NOTE — H&P (Signed)
  Nancy Villarreal  is a 51 y.o. woman with pulmonary sarcoidosis. She has been having hemoptysis. Plan is for bronchoscopy with airway examination and bronchoalveolar lavage if indicated for evaluation of hemoptysis.  No contraindications to proceeding.   [name], MD Pulmonary and [place] 05/06/2022 7:40 AM Pager: see AMION  If no response to pager, please call critical care on call (see AMION) until 7pm After 7:00 pm call Elink

## 2022-05-06 NOTE — Anesthesia Procedure Notes (Addendum)
Procedure Name: Intubation Date/Time: 05/06/2022 9:03 AM  Performed by: Lovie Chol, CRNAPre-anesthesia Checklist: Patient identified, Emergency Drugs available, Suction available and Patient being monitored Patient Re-evaluated:Patient Re-evaluated prior to induction Oxygen Delivery Method: Circle System Utilized Preoxygenation: Pre-oxygenation with 100% oxygen Induction Type: IV induction Ventilation: Mask ventilation without difficulty Laryngoscope Size: Miller and 3 Grade View: Grade II Tube type: Oral Tube size: 8.5 mm Number of attempts: 1 Airway Equipment and Method: Stylet Placement Confirmation: ETT inserted through vocal cords under direct vision, positive ETCO2 and breath sounds checked- equal and bilateral Secured at: 22 cm Tube secured with: Tape Dental Injury: Teeth and Oropharynx as per pre-operative assessment  Comments: Smooth IV induction. Easy mask. DL x 1 MIL3 CRNA Grade 2 view. Esophageal intubation immediately recognized. DL x 1 MD. Grade 2 view. Atraumatic intub. +ETCO2. BBSE.

## 2022-05-06 NOTE — Telephone Encounter (Signed)
Please call patient to schedule follow up after bronchoscopy in the next two weeks to review results and next steps. Either with Dr. Maple Hudson or with APP.   I am suspecting endobronchial sarcoidosis and she will need higher doses of immune suppression. However we are also ruling out infection.

## 2022-05-06 NOTE — Discharge Instructions (Signed)
No changes to your medications. You may have a low grade fever after the procedure, hoarse voice, and coughing. If you start coughing up blood more than 1-2 tbsp and it does not stop, you need to call us and go to the emergency department. You can resume your regular diet once you are home. Our office will call you either today or Monday to set up a sooner follow up appointment to follow up on results of biopsy and discuss next steps for your condition.

## 2022-05-06 NOTE — Transfer of Care (Signed)
Immediate Anesthesia Transfer of Care Note  Patient: [name] [facility]  Procedure(s) Performed: VIDEO BRONCHOSCOPY WITHOUT FLUORO BIOPSY BRONCHIAL WASHINGS  Patient Location: PACU  Anesthesia Type:General  Level of Consciousness: awake, oriented and patient cooperative  Airway & Oxygen Therapy: Patient Spontanous Breathing and Patient connected to nasal cannula oxygen  Post-op Assessment: Report given to RN and Post -op Vital signs reviewed and stable  Post vital signs: Reviewed  Last Vitals:  Vitals Value Taken Time  BP    Temp    Pulse 99 05/06/22 0954  Resp 24 05/06/22 0954  SpO2 98 % 05/06/22 0954  Vitals shown include unvalidated device data.  Last Pain:  Vitals:   05/06/22 0735  TempSrc: Oral  PainSc: 0-No pain         Complications: No notable events documented.

## 2022-05-06 NOTE — Op Note (Signed)
[facility] Cardiopulmonary Patient Name: Nancy Villarreal Date: 05/06/2022 MRN: [id number] Attending MD: Senaida Ores. Shearon Stalls MD, MD Date of Birth: 03-29-1971 CSN: Finalized Age: 51 Admit Type: Inpatient Gender: Female Procedure:             Bronchoscopy Indications:           Hemoptysis with abnormal CXR Providers:             Senaida Ores. Shearon Stalls MD, MD Referring MD:           Medicines:             General Anesthesia Complications:         No immediate complications. Estimated blood loss:                         Minimal Estimated Blood Loss:  Estimated blood loss was minimal. Procedure:             Pre-Anesthesia Assessment:                        - A History and Physical has been performed. The                         patient's medications, allergies and sensitivities                         have been reviewed.                        After obtaining informed consent, the bronchoscope was                         passed under direct vision. Throughout the procedure,                         the patient's blood pressure, pulse, and oxygen                         saturations were monitored continuously. the [id number]                         ([id number]) Olympus bronchoscope was introduced through                         the mouth, via the endotracheal tube (the patient was                         intubated for the procedure) and advanced to the                         tracheobronchial tree. The procedure was accomplished                         without difficulty. Scope In: Scope Out: Findings:      The endotracheal tube is in good position. The visualized portion of the       trachea is of normal caliber. The carina is sharp. Immediately on entry       into the trachea there was blood noted in the right mainsten bronchus.       The tracheobronchial tree  was examined to at least the first       subsegmental level. Bronchial mucosa and anatomy are diffusely        erythematous with some evidence of cobblestoned/textured airways. there       are no endobronchial lesions, and no secretions. I performed 4 biopsies       each of the RUL carina and LUL carina with a BAL of the RUL. The airways       were very friable and irritated, there was bleeding with minimal suction       trauma. Hemostasis was achieved prior to removing the bronchoscope. Impression:            - Hemoptysis with abnormal CXR                        - The airway examination was normal.                        - RUL and LUL endobronchial biopsies performed, RUL                         BAL performed. Moderate Sedation:      general anesthesia Recommendation:        - Await BAL, biopsy, culture and cytology results. Procedure Code(s):     --- Professional ---                        [phone], Bronchoscopy, rigid or flexible, including                         fluoroscopic guidance, when performed; diagnostic,                         with cell washing, when performed (separate procedure) Diagnosis Code(s):     --- Professional ---                        R04.2, Hemoptysis                        R91.8, Other nonspecific abnormal finding of lung field CPT copyright 2019 American Medical Association. All rights reserved. The codes documented in this report are preliminary and upon coder review may  be revised to meet current compliance requirements. Marquita Lias S. Shearon Stalls MD, MD 05/06/2022 10:00:15 AM This report has been signed electronically. Number of Addenda: 0

## 2022-05-06 NOTE — Anesthesia Preprocedure Evaluation (Addendum)
Anesthesia Evaluation  Patient identified by MRN, date of birth, ID band Patient awake    Reviewed: Allergy & Precautions, NPO status , Patient's Chart, lab work & pertinent test results  Airway Mallampati: III  TM Distance: >3 FB Neck ROM: Full    Dental no notable dental hx. (+) Dental Advisory Given, Teeth Intact   Pulmonary asthma , former smoker,  Hx sarcoid Hemoptysis   Neg sleep study in past   Pulmonary exam normal breath sounds clear to auscultation       Cardiovascular negative cardio ROS Normal cardiovascular exam Rhythm:Regular Rate:Normal     Neuro/Psych negative neurological ROS  negative psych ROS   GI/Hepatic negative GI ROS, Neg liver ROS,   Endo/Other  negative endocrine ROS  Renal/GU negative Renal ROS  negative genitourinary   Musculoskeletal negative musculoskeletal ROS (+)   Abdominal   Peds negative pediatric ROS (+)  Hematology  (+) Blood dyscrasia, anemia , Hb 11.8   Anesthesia Other Findings   Reproductive/Obstetrics negative OB ROS                            Anesthesia Physical Anesthesia Plan  ASA: 2  Anesthesia Plan: General   Post-op Pain Management:    Induction: Intravenous  PONV Risk Score and Plan:   Airway Management Planned: Oral ETT  Additional Equipment: None  Intra-op Plan:   Post-operative Plan: Extubation in OR  Informed Consent: I have reviewed the patients History and Physical, chart, labs and discussed the procedure including the risks, benefits and alternatives for the proposed anesthesia with the patient or authorized representative who has indicated his/her understanding and acceptance.     Dental advisory given  Plan Discussed with: CRNA  Anesthesia Plan Comments:         Anesthesia Quick Evaluation

## 2022-05-07 LAB — ACID FAST SMEAR (AFB, MYCOBACTERIA): Acid Fast Smear: NEGATIVE

## 2022-05-08 LAB — CULTURE, RESPIRATORY W GRAM STAIN: Culture: NORMAL

## 2022-05-09 ENCOUNTER — Encounter (HOSPITAL_COMMUNITY): Payer: Self-pay | Admitting: Internal Medicine

## 2022-05-09 LAB — FUNGUS STAIN

## 2022-05-09 LAB — FUNGAL STAIN REFLEX

## 2022-05-10 LAB — SURGICAL PATHOLOGY

## 2022-05-11 LAB — ANAEROBIC CULTURE W GRAM STAIN: Gram Stain: NONE SEEN

## 2022-05-11 LAB — CYTOLOGY - NON PAP

## 2022-05-11 NOTE — H&P (Signed)
Patient presents with history of pulmonary sarcoidosis and hemoptysis. Here for bronchoscopy for evaluation for hempotysis. No contraindications to proceed. Ok to proceed with bronchoscopy.   Durel Salts, MD Pulmonary and Critical Care Medicine [facility] 05/11/2022 1:42 PM Pager: see AMION  If no response to pager, please call critical care on call (see AMION) until 7pm After 7:00 pm call Elink

## 2022-05-16 ENCOUNTER — Telehealth: Payer: Self-pay | Admitting: Internal Medicine

## 2022-05-16 NOTE — Telephone Encounter (Signed)
Patient has follow up on 9/14 with Katie. Looks like phone call was made to remind patient of follow up. At follow up Florentina Addison will go over results of bronch. Nothing further needed

## 2022-05-16 NOTE — Telephone Encounter (Signed)
Patient states she was returning a call from a nurse and that we have been trying to get in touch with her. Informed patient she had a f/u on 9/14 at 12pm and it may have been a reminder.   Please advise if nurse contacted patient.

## 2022-05-18 NOTE — Telephone Encounter (Signed)
Received another request from [facility] for office notes from 5/11 - 05/16/22.  I faxed results of bronch, ONO (from April 2023), and OV notes from 01/03/22.  Office notes from 7/24 were previously faxed on 05/02/22 - fax# [phone]

## 2022-05-19 ENCOUNTER — Ambulatory Visit (INDEPENDENT_AMBULATORY_CARE_PROVIDER_SITE_OTHER): Payer: BC Managed Care – PPO | Admitting: Nurse Practitioner

## 2022-05-19 ENCOUNTER — Encounter: Payer: Self-pay | Admitting: Nurse Practitioner

## 2022-05-19 VITALS — BP 118/64 | HR 100 | Temp 98.6°F | Ht 63.0 in | Wt 189.0 lb

## 2022-05-19 DIAGNOSIS — R042 Hemoptysis: Secondary | ICD-10-CM | POA: Diagnosis not present

## 2022-05-19 DIAGNOSIS — Z23 Encounter for immunization: Secondary | ICD-10-CM | POA: Diagnosis not present

## 2022-05-19 DIAGNOSIS — D869 Sarcoidosis, unspecified: Secondary | ICD-10-CM | POA: Diagnosis not present

## 2022-05-19 MED ORDER — PREDNISONE 20 MG PO TABS: 20.0000 mg | ORAL_TABLET | Freq: Every day | ORAL | 2 refills | Status: DC

## 2022-05-19 MED ORDER — PREDNISONE 10 MG PO TABS: ORAL_TABLET | ORAL | 0 refills | Status: DC

## 2022-05-19 NOTE — Assessment & Plan Note (Signed)
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 She was diagnosed in January 2010. Previously on methotrexate. She has had elevated ACE levels for many years now. Recently struggled with recurrent hemoptysis. Underwent bronchoscopy on 9/1 with findings consistent with endobronchial sarcoidosis. She will required higher doses of immunosuppression. I am going to start her on prednisone taper of 40 mg for a week, 30 mg for a week then transition to 20 mg daily. Will discuss case with Dr. Maple Hudson to solidify treatment plan moving forward.   Patient Instructions  Prednisone 4 tabs (40 mg) daily for a week, then 3 tabs (30 mg) daily for a week, then start 20 mg daily. I will send 20 mg tablets for you to take so it will be one tablet daily. Take in AM with breakfast.   I will discuss your bronchoscopy results with Dr. Maple Hudson and let you know if there are any changes to our plan.  Follow up with Dr. Maple Hudson as scheduled on 10/30. If symptoms do not improve or worsen, please contact office for sooner follow up or seek emergency care.

## 2022-05-19 NOTE — Progress Notes (Signed)
@Patient  ID: , female    DOB: 1970/09/13, 51 y.o.   MRN: [id number]  Chief Complaint  Patient presents with   Follow-up    Here to go over result sof bronch.     Referring provider: [id number],*  HPI: 51 year old female, former smoker followed for sarcoidosis on chronic prednisone.  She had a negative bronchoscopy 09/24/2008; she was diagnosed with sarcoid based on mediastinal adenopathy/interstitial disease, chronic elevated ACE and responsiveness to steroids.  She is a patient of Dr. 09/26/2008 and last seen in office 03/28/2022.  Past medical history significant for asthma, allergic rhinitis, IDA, plasma cell disorder, obesity, HLD.  She was experiencing recurrent small volumes of hemoptysis.  Suspected to be due to focal bronchitis when she was seen by Dr. 03/30/2022 03/28/2022.  Discussed that she may need bronchoscopy for further evaluation if she continued to have recurrent episodes.  She then contacted the office on 05/01/2022 with reports of coughing up large amounts of red blood.  She had increased her prednisone to 40 mg.  She was advised to come in for imaging and set up for bronchoscopy for further evaluation.  She underwent bronchoscopy on 05/06/2022 performed by Dr. 07/06/2022 with findings consistent for endobronchial sarcoid. Bacterial cultures were without growth.  AFB and fungal are still pending.  TEST/EVENTS:  10/31/2016 PFT: FVC 95, FEV1 94, ratio 87, TLC 82, DLCOcor 60 03/25/2022 HRCT chest: Enlargement of the main pulmonary artery.  There are numerous coarsely calcified mediastinal and bilateral hilar lymph nodes.  There is moderate pulmonary fibrosis with an upper lobe and perihilar predominance, characterized by nodular and confluent consolidative opacity and architectural distortion.  Diffuse bilateral bronchial wall thickening.  Lobular air trapping is present.  Findings are worsened when compared to 2018 imaging.  05/19/2022: Today-follow-up Patient  presents today for follow-up after undergoing bronchoscopy.  She reports that she has been feeling better since her procedure.  Initially afterwards, she had some bloody sputum and increased cough for about a week.  Her cough has resolved since then.  She does still feel slightly more short of breath than she normally does but this has gradually improved.  Denies any chest congestion, pain or wheezing.  She does not have any vision changes or skin lesions.  She is currently on prednisone 10 mg daily.  She has been on methotrexate in the past and previously seen by rheumatology.   Allergies  Allergen Reactions   Hydrocodone Nausea And Vomiting    Immunization History  Administered Date(s) Administered   Influenza Split 06/12/2012   Influenza Whole 05/04/2011   Influenza,inj,Quad PF,6+ Mos 07/16/2013, 06/16/2014, 07/03/2018, 05/31/2019, 08/04/2020, 05/19/2022   Influenza-Unspecified 08/04/2020, 08/10/2021   PFIZER(Purple Top)SARS-COV-2 Vaccination 11/27/2019, 12/18/2019, 08/20/2020, 03/11/2021   Pfizer Covid-19 Vaccine Bivalent Booster 36yrs & up 08/10/2021   Pneumococcal Polysaccharide-23 05/04/2011   Pneumococcal-Unspecified 08/05/2018   Tdap 03/07/2012   Unspecified SARS-COV-2 Vaccination 11/27/2019, 12/18/2019, 08/20/2020   Zoster Recombinat (Shingrix) 08/10/2021    Past Medical History:  Diagnosis Date   Abnormal chest x-ray    Anemia    Hemoptysis    Sarcoid    bronch negative 09-2008 ACE 93   Unspecified asthma(493.90)     Tobacco History: Social History   Tobacco Use  Smoking Status Former   Packs/day: 0.25   Years: 10.00   Total pack years: 2.50   Types: Cigarettes   Start date: 2001   Quit date: 08/05/2008   Years since quitting: 13.7  Smokeless Tobacco Never  Counseling given: Not Answered   Outpatient Medications Prior to Visit  Medication Sig Dispense Refill   Biotin 24401 MCG TABS Take 10,000 mcg by mouth daily.     iron polysaccharides (NIFEREX) 150 MG  capsule Take 1 capsule (150 mg total) by mouth daily. 90 capsule 1   JUNEL FE 1.5/30 1.5-30 MG-MCG tablet Take 1 tablet by mouth daily.     OVER THE COUNTER MEDICATION Take 1 tablet by mouth 3 (three) times daily. Release     Prenatal Vit-Fe Fumarate-FA (PRENATAL MULTIVITAMIN) TABS tablet Take 2 tablets by mouth daily at 12 noon.     traMADol (ULTRAM) 50 MG tablet Take 1 tablet (50 mg total) by mouth every 6 (six) hours as needed. 40 tablet 0   venlafaxine XR (EFFEXOR-XR) 150 MG 24 hr capsule Take 1 capsule (150 mg total) by mouth daily with breakfast. 90 capsule 3   predniSONE (DELTASONE) 10 MG tablet 1 daily x 30 days then one every other day (Patient taking differently: Take 10 mg by mouth daily with breakfast.) 60 tablet 1   Semaglutide-Weight Management (WEGOVY) 0.5 MG/0.5ML SOAJ Inject 0.5 mg into the skin once a week. 2 mL 0   No facility-administered medications prior to visit.     Review of Systems:   Constitutional: No weight loss or gain, night sweats, fevers, chills, or lassitude. +fatigue  HEENT: No headaches, difficulty swallowing, tooth/dental problems, or sore throat. No sneezing, itching, ear ache, nasal congestion, or post nasal drip CV:  No chest pain, orthopnea, PND, swelling in lower extremities, anasarca, dizziness, palpitations, syncope Resp: +shortness of breath with exertion. No excess mucus or change in color of mucus. No productive or non-productive. No hemoptysis. No wheezing.  No chest wall deformity  Skin: No rash, lesions, ulcerations MSK:  No joint pain or swelling.  No decreased range of motion.  No back pain. Neuro: No dizziness or lightheadedness.  Psych: No depression or anxiety. Mood stable.     Physical Exam:  BP 118/64 (BP Location: Left Arm, Patient Position: Sitting, Cuff Size: Normal)   Pulse 100   Temp 98.6 F (37 C) (Oral)   Ht 5\' 3"  (1.6 m)   Wt 189 lb (85.7 kg)   SpO2 98%   BMI 33.48 kg/m   GEN: Pleasant, interactive, well-appearing;  obese; in no acute distress. HEENT:  Normocephalic and atraumatic. PERRLA. Sclera white. Nasal turbinates pink, moist and patent bilaterally. No rhinorrhea present. Oropharynx pink and moist, without exudate or edema. No lesions, ulcerations, or postnasal drip.  NECK:  Supple w/ fair ROM. No lymphadenopathy.   CV: RRR, no m/r/g, no peripheral edema. Pulses intact, +2 bilaterally. No cyanosis, pallor or clubbing. PULMONARY:  Unlabored, regular breathing. Clear bilaterally A&P w/o wheezes/rales/rhonchi. No accessory muscle use. No dullness to percussion. GI: BS present and normoactive. Soft, non-tender to palpation. No organomegaly or masses detected.  MSK: No erythema, warmth or tenderness. No deformities or joint swelling noted.  Neuro: A/Ox3. No focal deficits noted.   Skin: Warm, no lesions or rashe Psych: Normal affect and behavior. Judgement and thought content appropriate.     Lab Results:  CBC    Component Value Date/Time   WBC 5.8 05/04/2022 1410   RBC 4.15 05/04/2022 1410   HGB 11.8 (L) 05/04/2022 1410   HGB 10.6 (L) 04/22/2021 1403   HCT 36.0 05/04/2022 1410   PLT 170.0 05/04/2022 1410   PLT 209 04/22/2021 1403   MCV 86.7 05/04/2022 1410   MCV 78.5 (A) 10/06/2016  0849   MCH 25.4 (L) 04/22/2021 1403   MCHC 32.7 05/04/2022 1410   RDW 12.5 05/04/2022 1410   LYMPHSABS 1.1 05/04/2022 1410   MONOABS 0.3 05/04/2022 1410   EOSABS 0.0 05/04/2022 1410   BASOSABS 0.0 05/04/2022 1410    BMET    Component Value Date/Time   NA 134 (L) 05/04/2022 1410   NA 136 10/06/2016 0840   K 4.3 05/04/2022 1410   CL 104 05/04/2022 1410   CO2 24 05/04/2022 1410   GLUCOSE 120 (H) 05/04/2022 1410   BUN 15 05/04/2022 1410   BUN 12 01/13/2021 0000   CREATININE 0.75 05/04/2022 1410   CREATININE 0.96 10/15/2020 1333   CALCIUM 8.3 (L) 05/04/2022 1410   GFRNONAA 69 10/15/2020 1333   GFRAA 80 10/15/2020 1333    BNP No results found for: "BNP"   Imaging:  DG Chest 2 View  Result  Date: 05/04/2022 CLINICAL DATA:  Hemoptysis.  History of sarcoidosis. EXAM: CHEST - 2 VIEW COMPARISON:  August 12, 2021. FINDINGS: The heart size and mediastinal contours are within normal limits. Stable reticulo nodular opacities are noted throughout both lungs consistent with chronic interstitial lung disease, fibrosis or possible sarcoidosis. No definite acute abnormality is noted. The visualized skeletal structures are unremarkable. IMPRESSION: Stable chronic findings as described above. No significant changes noted. Electronically Signed   By: Lupita Raider M.D.   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 She was diagnosed in January 2010. Previously on methotrexate. She has had elevated ACE levels for many years now. Recently struggled with recurrent hemoptysis. Underwent bronchoscopy on 9/1 with findings consistent with endobronchial sarcoidosis. She will required higher doses of immunosuppression. I am going to start her on prednisone taper of 40 mg for a week, 30 mg for a week then transition to 20 mg daily. Will discuss case with Dr. Maple Hudson to solidify treatment plan moving forward.   Patient Instructions  Prednisone 4 tabs (40 mg) daily for a week, then 3 tabs (30 mg) daily for a week, then start 20 mg daily. I will send 20 mg tablets for you to take so it will be one tablet daily. Take in AM with  breakfast.   I will discuss your bronchoscopy results with Dr. Maple Hudson and let you know if there are any changes to our plan.  Follow up with Dr. Maple Hudson as scheduled on 10/30. If symptoms do not improve or worsen, please contact office for sooner follow up or seek emergency care.     Hemoptysis Resolved. See above plan.  I spent 35 minutes of dedicated to the care of this patient on the  date of this encounter to include pre-visit review of records, face-to-face time with the patient discussing conditions above, post visit ordering of testing, clinical documentation with the electronic health record, making appropriate referrals as documented, and communicating necessary findings to members of the patients care team.  Noemi Chapel, NP 05/19/2022  Pt aware and understands NP's role.

## 2022-05-19 NOTE — Assessment & Plan Note (Signed)
Resolved. See above plan.

## 2022-05-19 NOTE — Patient Instructions (Signed)
Prednisone 4 tabs (40 mg) daily for a week, then 3 tabs (30 mg) daily for a week, then start 20 mg daily. I will send 20 mg tablets for you to take so it will be one tablet daily. Take in AM with breakfast.   I will discuss your bronchoscopy results with Dr. Maple Hudson and let you know if there are any changes to our plan.  Follow up with Dr. Maple Hudson as scheduled on 10/30. If symptoms do not improve or worsen, please contact office for sooner follow up or seek emergency care.

## 2022-05-20 ENCOUNTER — Encounter: Payer: Self-pay | Admitting: Family

## 2022-05-20 ENCOUNTER — Ambulatory Visit (INDEPENDENT_AMBULATORY_CARE_PROVIDER_SITE_OTHER): Payer: BC Managed Care – PPO | Admitting: Family

## 2022-05-20 VITALS — BP 100/68 | HR 110 | Temp 99.0°F | Ht 63.0 in | Wt 188.6 lb

## 2022-05-20 DIAGNOSIS — G4700 Insomnia, unspecified: Secondary | ICD-10-CM | POA: Diagnosis not present

## 2022-05-20 MED ORDER — TRAZODONE HCL 50 MG PO TABS: 50.0000 mg | ORAL_TABLET | Freq: Every evening | ORAL | 1 refills | Status: DC | PRN

## 2022-05-20 NOTE — Progress Notes (Signed)
Nancy Villarreal is a 51 y.o. 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/28/2021   Sinus tachycardia 10/12/2021   Hyperlipidemia 10/12/2021   Acute bronchitis 11/16/2020   Rash and nonspecific skin eruption 09/18/2020   Upper respiratory infection, acute 06/10/2017   Bilateral hand pain 01/13/2017   Pain, joint, shoulder, left 01/13/2017   Pain in left ankle and joints of left foot 01/13/2017   High risk medication use 01/13/2017   Nausea 08/09/2016   Cough 03/09/2016   Obesity (BMI 30-39.9) 01/22/2016   Right foot pain 01/21/2016   Routine general medical examination at a health care facility 12/11/2013   Visit for screening mammogram 03/07/2012   Anemia, iron deficiency 02/10/2012   Hemoptysis 09/07/2010   Sarcoidosis (HCC) 09/17/2008   Asthma in remission 09/03/2008   Allergic rhinitis due to pollen 09/03/2008    Current Outpatient Medications  Medication Sig Dispense Refill   Biotin 56213 MCG TABS Take 10,000 mcg by mouth daily.     iron polysaccharides (NIFEREX) 150 MG capsule Take 1 capsule (150 mg total) by mouth daily. 90 capsule 1   [START ON 06/01/2022] predniSONE (DELTASONE) 20 MG tablet Take 1 tablet (20 mg total) by mouth daily with breakfast. 30 tablet 2   Prenatal Vit-Fe Fumarate-FA (PRENATAL MULTIVITAMIN) TABS tablet Take 2 tablets by mouth daily at 12 noon.     traMADol (ULTRAM) 50 MG tablet Take 1 tablet (50 mg total) by mouth every 6 (six) hours as needed. 40 tablet 0   traZODone (DESYREL) 50 MG tablet Take 1 tablet (50 mg total) by mouth at bedtime as needed for sleep. 30 tablet 1   venlafaxine XR (EFFEXOR-XR) 150 MG 24 hr capsule Take 1 capsule (150 mg total) by mouth daily with breakfast. 90 capsule 3   predniSONE (DELTASONE) 10 MG tablet 4 tabs daily for 7 days then 3 tabs daily for 7 days (Patient not taking:  Reported on 05/20/2022) 49 tablet 0   No current facility-administered medications for this visit.    Allergies: Hydrocodone  Past Medical History:  Diagnosis Date   Abnormal chest x-ray    Anemia    Hemoptysis    Sarcoid    bronch negative 09-2008 ACE 93   Unspecified asthma(493.90)     Past Surgical History:  Procedure Laterality Date   BIOPSY  05/06/2022   Procedure: BIOPSY;  Surgeon: Charlott Holler, MD;  Location: [facility] ENDOSCOPY;  Service: Pulmonary;;   BRONCHIAL WASHINGS  05/06/2022   Procedure: BRONCHIAL WASHINGS;  Surgeon: Charlott Holler, MD;  Location: [facility] ENDOSCOPY;  Service: Pulmonary;;   BRONCHOSCOPY  09-2008   Negative   CESAREAN SECTION     HEMOSTASIS CONTROL  05/06/2022   Procedure: HEMOSTASIS CONTROL;  Surgeon: Charlott Holler, MD;  Location: [facility] ENDOSCOPY;  Service: Pulmonary;;   TUBAL LIGATION     1995   VIDEO BRONCHOSCOPY N/A 05/06/2022   Procedure: VIDEO BRONCHOSCOPY WITHOUT FLUORO;  Surgeon: Charlott Holler, MD;  Location: [facility] ENDOSCOPY;  Service: Pulmonary;  Laterality: N/A;    Family History  Problem Relation Age of Onset   Asthma Paternal Grandfather    Lung cancer Maternal Grandfather    Hyperlipidemia Mother    Hypertension Mother    Cancer Mother        lung    Diabetes Father    Hyperlipidemia  Maternal Grandmother    Stroke Maternal Grandmother    Cancer Paternal Grandmother    Alcohol abuse Paternal Grandmother    Early death Paternal Grandmother    Healthy Son    Healthy Daughter     Social History   Tobacco Use   Smoking status: Former    Packs/day: 0.25    Years: 10.00    Total pack years: 2.50    Types: Cigarettes    Start date: 2001    Quit date: 08/05/2008    Years since quitting: 13.7   Smokeless tobacco: Never  Substance Use Topics   Alcohol use: Yes    Subjective:  Presents today with concerns for insomnia for the past 2 weeks; had been taking Advil PM but was asked to stop by her pulmonologist; concerned that she will be getting  ready to start high dose of prednisone early next week for management of sarcoidosis; has been having increased headaches with the chronic insomnia;      Objective:  Vitals:   05/20/22 1253  BP: 100/68  Pulse: (!) 110  Temp: 99 F (37.2 C)  TempSrc: Oral  SpO2: 97%  Weight: 188 lb 9.6 oz (85.5 kg)  Height: 5\' 3"  (1.6 m)    General: Well developed, well nourished, in no acute distress  Skin : Warm and dry.  Head: Normocephalic and atraumatic  Lungs: Respirations unlabored;  Neurologic: Alert and oriented; speech intact; face symmetrical; moves all extremities well; CNII-XII intact without focal deficit   Assessment:  1. Insomnia, unspecified type     Plan:  Trial of Trazodone 50 mg; she understands she can start with 1/2 tablet initially and increase up to 2/ night as needed; depending on her response, may need to consider alternative medication.   No follow-ups on file.  No orders of the defined types were placed in this encounter.   Requested Prescriptions   Signed Prescriptions Disp Refills   traZODone (DESYREL) 50 MG tablet 30 tablet 1    Sig: Take 1 tablet (50 mg total) by mouth at bedtime as needed for sleep.

## 2022-05-25 ENCOUNTER — Other Ambulatory Visit: Payer: Self-pay | Admitting: Family

## 2022-05-30 DIAGNOSIS — R7303 Prediabetes: Secondary | ICD-10-CM | POA: Diagnosis not present

## 2022-06-02 ENCOUNTER — Encounter: Payer: Self-pay | Admitting: Internal Medicine

## 2022-06-02 NOTE — Telephone Encounter (Signed)
Start tapering prednisone by alternating 20 mg with 10 mg (1/2 tab) every other day x 7 days, the to 1/2 tab (10 mg) daily. See how you feel after a week bacck on 10 mg daily.

## 2022-06-02 NOTE — Telephone Encounter (Signed)
Mychart message sent by pt: [facility] (supporting Deneise Lever, MD) 13 hours ago (3:20 AM)    Dr Annamaria Boots, how will we know this course of predisone will fix the "issue"(lesions)?   I can't sleep, irritated beyond belief, I can't remember anything, when trying to explain myself, my words are slow, thoughts are challenging. Noise creates total shutdown, I can't do anything but lay down with my eyes closed. All side effects of predisone I know, but is the "means" worth the "ends". And how will we know!?????   I am also feeling a dull pain/ heaviness in the middle of my chest/breastbone. Is this normal?    Dr. Annamaria Boots, please advise.

## 2022-06-05 LAB — FUNGUS CULTURE WITH STAIN

## 2022-06-05 LAB — FUNGAL ORGANISM REFLEX

## 2022-06-05 LAB — FUNGUS CULTURE RESULT

## 2022-06-17 ENCOUNTER — Other Ambulatory Visit: Payer: Self-pay

## 2022-06-17 ENCOUNTER — Encounter: Payer: Self-pay | Admitting: Internal Medicine

## 2022-06-17 MED ORDER — AZITHROMYCIN 250 MG PO TABS: ORAL_TABLET | ORAL | 0 refills | Status: AC

## 2022-06-17 MED ORDER — PREDNISONE 10 MG PO TABS: ORAL_TABLET | ORAL | 0 refills | Status: AC

## 2022-06-17 NOTE — Telephone Encounter (Signed)
Please send prednisone 10 mg, # 20, 4 X 2 DAYS, 3 X 2 DAYS, 2 X 2 DAYS, 1 X 2 DAYS                       Zpak 250 mg, # 6, 2 today then one daily

## 2022-06-18 LAB — ACID FAST CULTURE WITH REFLEXED SENSITIVITIES (MYCOBACTERIA): Acid Fast Culture: NEGATIVE

## 2022-06-29 DIAGNOSIS — R7303 Prediabetes: Secondary | ICD-10-CM | POA: Diagnosis not present

## 2022-07-02 NOTE — Progress Notes (Unsigned)
Patient ID: Nancy Villarreal. Nancy Villarreal, female    DOB: 03/08/1971, 51 y.o.   MRN: [id number]  HPI F former smoker followed here with sarcoid Stage III,  Bronch NEG 09/24/08- dx based on mediastinal adenopathy/ interstitial disease,chronic elev ACE , response to steroid. Complicated by Iron def anemia, plasma cell disorder. Bronch NEG 09/24/08- dx based on mediastinal adenopathy/ interstitial disease, ACE 90, response to steroids. PFT 10/11/16- ACE 09/30/16- [place]  ACE 12/07/17- 97, up from 89 in 2018 ACE 01/08/19- 67 WNL ACE 08/04/20- 80H Immunoglobulins 12/19/16- IgG 2,669 ----------------------------------------------------------------------------------------------------   03/28/22-  51yoF former smoker(2.5 pk yrs) followed here with Sarcoid Stage III ( Bronch NEG 09/24/08- dx based on mediastinal adenopathy/ interstitial disease, chronic elevation of ACE, response to steroid), Hemoptysis, Bronchitis, Complicated by Iron def anemia, plasma cell disorder -Albuterol hfa,  Short term Disability -status? Covid vax- 5 Phizer Flu vax- had Has asked PCP for [place] for Sarcoid- Had self-limited recurrence of hemoptysis July 18> 2 tbsp, chest pain, HA,> prednisone taper Lab 12/2521- ACE 48 wnl Now back on maintenance prednisone 10 mg alt days. CT reviewed. Only light cough. Denies fever, nodes, rash. HRCT chest 03/25/22-   IMPRESSION: 1. Moderate pulmonary fibrosis, with an upper lobe and perihilar predominance, characterized by fine nodular and confluent consolidative opacity and architectural distortion, with many nodules concentrated along the fissures and pleura (i.e. perilymphatic distribution). If characterized by ATS pulmonary fibrosis criteria, findings are in an alternative diagnosis (not UIP) pattern, and specifically highly suggestive of pulmonary sarcoidosis with some evidence of developing mass of fibrosis: Diagnosis of Idiopathic Pulmonary Fibrosis: An  Official ATS/ERS/JRS/ALAT Clinical Practice Guideline. [place], Iss 5, [phone], May 06 2017. 2. Numerous coarsely calcified mediastinal and bilateral hilar lymph nodes, consistent with nodal sarcoidosis. 3. Lobular air trapping on expiratory phase imaging, suggestive of small airways disease. 4. Enlargement of the main pulmonary artery, as can be seen in pulmonary hypertension. Aortic Atherosclerosis (ICD10-I70.0). ECHO 11/18/21-(Dr Berry)"Normal LV systolic function with some mild wall motion abnormalities and mildly elevated pulmonary artery pressures.  Overall fairly normal 2D echo."  07/04/22-  51yoF former smoker(2.5 pk yrs) followed here with Sarcoid Stage III ( Bronch NEG 09/24/08- dx based on mediastinal adenopathy/ interstitial disease, chronic elevation of ACE, response to steroid),Bronch + 05/06/22,  Hemoptysis, Bronchitis, Complicated by Iron def anemia, plasma cell disorder -Albuterol hfa, prednisone 20 mg daily,  Short term Disability -status? Covid vax- 6 Phizer Flu vax- had Has asked PCP for [place] for Sarcoid- Had self-limited recurrence of hemoptysis July 18> 2 tbsp, chest pain, HA,> prednisone taper Lab 4/25- ACE 48 wnl Bronchoscopy Shearon Stalls) 05/06/22- endobronchial sarcoid LOV 05/19/22- [name], NP-pred taper to 20 qd -----Pt is getting over a cold, is doing a little better  We reviewed her bronchoscopy from September confirming diagnosis of sarcoid.  ACE levels have never been reliably elevated.  No recent hemoptysis.  She has been stable on prednisone 20 mg daily but we discussed desire to find the lowest necessary dose.  She has about 15 of the 20 mg tablets left so we are going to alternate 20 with 10 mg every other day and replace her supply with 10 mg tablets when needed. CXR 05/04/22-  FINDINGS: The heart size and mediastinal contours are within normal limits. Stable reticulo nodular opacities are noted throughout both  lungs consistent with chronic interstitial lung disease, fibrosis or possible sarcoidosis. No definite acute abnormality is noted. The visualized skeletal  structures are unremarkable. IMPRESSION: Stable chronic findings as described above. No significant changes noted.  Review of Systems-see HPI   + = positive HEENT:   No headaches,  Difficulty swallowing,  Tooth/dental problems,  Sore throat,                No sneezing, itching, ear ache, nasal congestion, post nasal drip,  CV:  No- chest pain,  No-PND, swelling in lower extremities, anasarca, dizziness, palpitations GI  No heartburn, indigestion, abdominal pain, nausea, vomiting,  Resp:  No acute shortness of breath with exertion .  No excess mucus, +productive cough,                  non-productive cough,  +recent coughing up of blood.  No change in color of mucus.  No wheezing.  Skin: no rash or lesions. GU: MS: joint pain .  No decreased range of motion.  No back pain. Psych:  No change in mood or affect. No depression or anxiety.  No memory loss.  Objective:   Physical Exam General- Alert, Oriented, Affect-appropriate, Distress- none acute.  Skin-   no obvious rash Lymphadenopathy- none Head- atraumatic            Eyes- Gross vision intact, PERRLA, conjunctivae clear secretions            Ears- Hearing, canals normal            Nose- Clear, No-Septal dev, mucus, polyps, erosion, perforation             Throat- Mallampati II , mucosa-clear , drainage- none, tonsils present.                     Neck- flexible , trachea midline, no stridor , thyroid nl, carotid no bruit Chest - symmetrical excursion , unlabored           Heart/CV- RRR , no murmur , no gallop  , no rub, nl s1 s2                           - JVD- none , edema- none, stasis changes- none, varices- none           Lung-   Cough-none, +Clear, unlabored, wheeze- none,  dullness-none, rub- none           Chest wall-  Abd-  Br/ Gen/ Rectal- Not done, not  indicated Extrem- cyanosis- none, clubbing, none, atrophy- none, strength- nl Neuro- grossly intact to observation

## 2022-07-04 ENCOUNTER — Ambulatory Visit (INDEPENDENT_AMBULATORY_CARE_PROVIDER_SITE_OTHER): Payer: BC Managed Care – PPO | Admitting: Internal Medicine

## 2022-07-04 ENCOUNTER — Encounter: Payer: Self-pay | Admitting: Internal Medicine

## 2022-07-04 VITALS — BP 122/82 | HR 106 | Ht 63.0 in | Wt 189.8 lb

## 2022-07-04 DIAGNOSIS — D869 Sarcoidosis, unspecified: Secondary | ICD-10-CM | POA: Diagnosis not present

## 2022-07-04 NOTE — Assessment & Plan Note (Signed)
Bronchoscopic biopsy confirmation of sarcoidosis.  Discussed steroid therapy. Plan-change to prednisone 20 mg alternating with 10 mg every other day.  As supply runs low we will replace her 20 mg tablets with 10 mg tablets for very slow tapering as tolerated.

## 2022-07-04 NOTE — Patient Instructions (Addendum)
Order- CXR today    dx sarcoid   Order- schedule HRCT chest in 2 months     dx sarcoid  Recommend- alternate prednisone 20 mg with 10 mg every other day. See how you feel.Let me know how you feel as your current supply gets low- I probably will refill with 10 mg tabs.

## 2022-07-11 ENCOUNTER — Encounter: Payer: Self-pay | Admitting: Family

## 2022-07-11 ENCOUNTER — Other Ambulatory Visit: Payer: Self-pay | Admitting: Family

## 2022-07-12 ENCOUNTER — Other Ambulatory Visit: Payer: Self-pay | Admitting: Family

## 2022-07-12 DIAGNOSIS — Z6833 Body mass index (BMI) 33.0-33.9, adult: Secondary | ICD-10-CM

## 2022-07-16 ENCOUNTER — Encounter: Payer: Self-pay | Admitting: Internal Medicine

## 2022-07-18 NOTE — Telephone Encounter (Signed)
Patient returned call. She stated she did indeed cough up some bright red blood on Saturday morning around 545am. She did not have any worsening SOB, chest pain or throat pain during this episode. She continued to have a cough during the day but she did not see any more blood. She states that she overall feels fine this morning, no worsening symptoms. Declined appointment. She is aware to call us back if this happens again.   Will route to CY as a FYI.

## 2022-07-18 NOTE — Telephone Encounter (Signed)
This is likely to happen occasionally. If it keeps on or gets heavy let us know.

## 2022-07-18 NOTE — Telephone Encounter (Signed)
Called patient due to the nature of her MyChart message but she did not answer. Left message for her to call us back.

## 2022-07-19 ENCOUNTER — Encounter (INDEPENDENT_AMBULATORY_CARE_PROVIDER_SITE_OTHER): Payer: Self-pay

## 2022-07-25 ENCOUNTER — Encounter: Payer: Self-pay | Admitting: Internal Medicine

## 2022-07-25 NOTE — Telephone Encounter (Signed)
Repeat that prednisone again tomorrow, then hopefully you can go back to 10 mg daily the next day.

## 2022-08-11 ENCOUNTER — Ambulatory Visit (INDEPENDENT_AMBULATORY_CARE_PROVIDER_SITE_OTHER): Payer: BC Managed Care – PPO | Admitting: Internal Medicine

## 2022-08-11 ENCOUNTER — Encounter (INDEPENDENT_AMBULATORY_CARE_PROVIDER_SITE_OTHER): Payer: Self-pay | Admitting: Internal Medicine

## 2022-08-11 VITALS — BP 108/76 | HR 99 | Temp 98.3°F | Ht 63.0 in | Wt 195.0 lb

## 2022-08-11 DIAGNOSIS — R7303 Prediabetes: Secondary | ICD-10-CM

## 2022-08-11 DIAGNOSIS — D869 Sarcoidosis, unspecified: Secondary | ICD-10-CM | POA: Diagnosis not present

## 2022-08-11 DIAGNOSIS — Z0289 Encounter for other administrative examinations: Secondary | ICD-10-CM

## 2022-08-11 DIAGNOSIS — E669 Obesity, unspecified: Secondary | ICD-10-CM

## 2022-08-11 DIAGNOSIS — Z7952 Long term (current) use of systemic steroids: Secondary | ICD-10-CM

## 2022-08-11 DIAGNOSIS — Z6834 Body mass index (BMI) 34.0-34.9, adult: Secondary | ICD-10-CM

## 2022-08-11 NOTE — Progress Notes (Addendum)
Office: [phone]  /  Fax: [phone]   Initial Visit  Nancy Villarreal was seen in clinic today to evaluate for obesity. She is interested in losing weight to improve overall health and reduce the risk of weight related complications. She presents today to review program treatment options, initial physical assessment, and evaluation.  She has a history of sarcoid and is on chronic prednisone.  She was referred by: PCP close to [facility]  When asked what else they would like to accomplish? She states: Adopt healthier eating patterns, Improve existing medical conditions, and Improve quality of life  When asked how has your weight affected you? She states: Contributed to medical problems, Having fatigue, Having poor endurance, and Problems with eating patterns  Some associated conditions: Prediabetes  Contributing factors: Family history, Nutritional, Medications, and Reduced physical activity  Weight promoting medications identified: Steroids  Current nutrition plan: None  Current level of physical activity: None  Current or previous pharmacotherapy: Had been prescribed or is on Wegovy.     Past medical history includes:   Past Medical History:  Diagnosis Date   Abnormal chest x-ray    Anemia    Hemoptysis    Sarcoid    bronch negative 09-2008 ACE 93   Unspecified asthma(493.90)      Objective:   BP 108/76   Pulse 99   Temp 98.3 F (36.8 C)   Ht 5\' 3"  (1.6 m)   Wt 195 lb (88.5 kg)   SpO2 100%   BMI 34.54 kg/m  She was weighed on the bioimpedance scale: Body mass index is 34.54 kg/m.  Peak Weight: 250,Visceral Fat Rating: 11, Body Fat%: 43.2, Weight trend over the last 12 months: Increasing  General:  Alert, oriented and cooperative. Patient is in no acute distress.  Respiratory: Normal respiratory effort, no problems with respiration noted  Extremities: Normal range of motion.    Mental Status: Normal mood and affect. Normal behavior. Normal judgment and  thought content.   Assessment and Plan:  1. Class 1 obesity without serious comorbidity with body mass index (BMI) of 34.0 to 34.9 in adult, unspecified obesity type We reviewed weight, biometrics, associated medical conditions and contributing factors with patient. She would benefit from weight loss therapy via a modified calorie, low-carb, high-protein nutritional plan tailored to their REE (resting energy expenditure) which will be determined by indirect calorimetry.  We will also assess for cardiometabolic risk and nutritional derangements via fasting serologies at her next appointment.   2. Sarcoidosis (HCC) She reports having stage IV.  She is currently on systemic steroids which have been contributing to her weight also increase her risk of immunosuppression.  I advised patient to start vitamin D supplementation 2 prevent bone loss.  She had a bone density recently that was normal.  3. Current chronic use of systemic steroids Contributing to weight gain and adiposity.  We reviewed mechanisms.  This may affect her weight loss efforts and would benefit from pharmacotherapy in addition to nutritional weight loss.  4.  Prediabetes I reviewed her labs she has an A1c of 6.1.  This may also be in part related to the use of steroids and she is at risk for progression.  She will benefit from weight loss of about 7 to 10% of body weight and increasing physical activity 150 minutes a week of moderate intensity walking.      Obesity Treatment / Action Plan:  Patient will work on garnering support from family and friends to begin weight  loss journey. Will work on eliminating or reducing the presence of highly palatable, calorie dense foods in the home. Will complete provided nutritional and psychosocial assessment questionnaire before the next appointment. Will be scheduled for indirect calorimetry to determine resting energy expenditure in a fasting state.  This will allow [place] to create a reduced  calorie, high-protein meal plan to promote loss of fat mass while preserving muscle mass. Counseled on the health benefits of losing 5%-15% of total body weight. Was counseled on nutritional approaches to weight loss and benefits of complex carbs and high quality protein as part of nutritional weight management. Was counseled on pharmacotherapy and role as an adjunct in weight management.   Obesity Education Performed Today:  She was weighed on the bioimpedance scale and results were discussed and documented in the synopsis.  We discussed obesity as a disease and the importance of a more detailed evaluation of all the factors contributing to the disease.  We discussed the importance of long term lifestyle changes which include nutrition, exercise and behavioral modifications as well as the importance of customizing this to her specific health and social needs.  We discussed the benefits of reaching a healthier weight to alleviate the symptoms of existing conditions and reduce the risks of the biomechanical, metabolic and psychological effects of obesity.  Nancy Villarreal appears to be in the action stage of change and states they are ready to start intensive lifestyle modifications and behavioral modifications.  30 minutes was spent today on this visit including the above counseling, pre-visit chart review, and post-visit documentation.  Reviewed by clinician on day of visit: allergies, medications, problem list, medical history, surgical history, family history, social history, and previous encounter notes.    I have reviewed the above documentation for accuracy and completeness, and I agree with the above.  Worthy Rancher, MD

## 2022-08-12 NOTE — Progress Notes (Unsigned)
Patient ID: Nancy Villarreal. Nancy Villarreal, female    DOB: May 23, 1971, 52 y.o.   MRN: [id number]  HPI F former smoker followed here with sarcoid Stage III,  Bronch NEG 09/24/08- dx based on mediastinal adenopathy/ interstitial disease,chronic elev ACE , response to steroid. Complicated by Iron def anemia, plasma cell disorder. Bronch NEG 09/24/08- dx based on mediastinal adenopathy/ interstitial disease, ACE 90, response to steroids. PFT 10/11/16- ACE 09/30/16- 86H  ACE 12/07/17- 97, up from 89 in 2018 ACE 01/08/19- 67 WNL ACE 08/04/20- 80H Immunoglobulins 12/19/16- IgG 2,669 ----------------------------------------------------------------------------------------------------  07/04/22-  51yoF former smoker(2.5 pk yrs) followed here with Sarcoid Stage III ( Bronch NEG 09/24/08- dx based on mediastinal adenopathy/ interstitial disease, chronic elevation of ACE, response to steroid),Bronch + 05/06/22,  Hemoptysis, Bronchitis, Complicated by Iron def anemia, plasma cell disorder -Albuterol hfa, prednisone 20 mg daily,  Short term Disability -status? Covid vax- 6 Phizer Flu vax- had Has asked PCP for [place] [facility] for Sarcoid- Had self-limited recurrence of hemoptysis July 18> 2 tbsp, chest pain, HA,> prednisone taper Lab 4/25- ACE 48 wnl Bronchoscopy Celine Mans) 05/06/22- endobronchial sarcoid LOV 05/19/22- [name], NP-pred taper to 20 qd -----Pt is getting over a cold, is doing a little better  We reviewed her bronchoscopy from September confirming diagnosis of sarcoid.  ACE levels have never been reliably elevated.  No recent hemoptysis.  She has been stable on prednisone 20 mg daily but we discussed desire to find the lowest necessary dose.  She has about 15 of the 20 mg tablets left so we are going to alternate 20 with 10 mg every other day and replace her supply with 10 mg tablets when needed. CXR 05/04/22-  FINDINGS: The heart size and mediastinal contours are within normal limits. Stable reticulo nodular  opacities are noted throughout both lungs consistent with chronic interstitial lung disease, fibrosis or possible sarcoidosis. No definite acute abnormality is noted. The visualized skeletal structures are unremarkable. IMPRESSION: Stable chronic findings as described above. No significant changes noted.  08/15/22-  51yoF former smoker(2.5 pk yrs) followed here with Sarcoid Stage III ( Bronch NEG 09/24/08- dx based on mediastinal adenopathy/ interstitial disease, chronic elevation of ACE, response to steroid),Bronch + 05/06/22,  Hemoptysis, Bronchitis, Complicated by Iron def anemia, plasma cell disorder -Albuterol hfa, prednisone 20 mg daily started 9/27,  Short term Disability -status? Covid vax- 6 Phizer Flu vax-had Has asked PCP for [place] [facility] for Sarcoid- Lab 4/25- ACE 48 wnl Body weight today 198 lbs Pending HRCT 12/29 -----Pt is doing better no longer coughing up blood She has been on prednisone 20 mg daily since the end of September and is fighting weight gain and blood sugar.  Not working with [facility].  We discussed adrenal function and effort to reduce her steroid therapy.  We discussed availability of methotrexate as an alternative.  Review of Systems-see HPI   + = positive HEENT:   No headaches,  Difficulty swallowing,  Tooth/dental problems,  Sore throat,                No sneezing, itching, ear ache, nasal congestion, post nasal drip,  CV:  No- chest pain,  No-PND, swelling in lower extremities, anasarca, dizziness, palpitations GI  No heartburn, indigestion, abdominal pain, nausea, vomiting,  Resp:  No acute shortness of breath with exertion .  No excess mucus, +productive cough,                  non-productive cough,  +  recent coughing up of blood.  No change in color of mucus.  No wheezing.  Skin: no rash or lesions. GU: MS: joint pain .  No decreased range of motion.  No back pain. Psych:  No change in mood or affect. No depression or  anxiety.  No memory loss.  Objective:   Physical Exam General- Alert, Oriented, Affect-appropriate, Distress- none acute.  Skin-   + mild acne Lymphadenopathy- none Head- atraumatic            Eyes- Gross vision intact, PERRLA, conjunctivae clear secretions            Ears- Hearing, canals normal            Nose- Clear, No-Septal dev, mucus, polyps, erosion, perforation             Throat- Mallampati II , mucosa-clear , drainage- none, tonsils present.                     Neck- flexible , trachea midline, no stridor , thyroid nl, carotid no bruit Chest - symmetrical excursion , unlabored           Heart/CV- RRR , no murmur , no gallop  , no rub, nl s1 s2                           - JVD- none , edema- none, stasis changes- none, varices- none           Lung-   Cough-none, + somewhat coarse in bases, unlabored, wheeze- none,  dullness-none, rub- none           Chest wall-  Abd-  Br/ Gen/ Rectal- Not done, not indicated Extrem- cyanosis- none, clubbing, none, atrophy- none, strength- nl Neuro- grossly intact to observation

## 2022-08-15 ENCOUNTER — Encounter: Payer: Self-pay | Admitting: Internal Medicine

## 2022-08-15 ENCOUNTER — Ambulatory Visit (INDEPENDENT_AMBULATORY_CARE_PROVIDER_SITE_OTHER): Payer: BC Managed Care – PPO | Admitting: Internal Medicine

## 2022-08-15 ENCOUNTER — Encounter (INDEPENDENT_AMBULATORY_CARE_PROVIDER_SITE_OTHER): Payer: Managed Care, Other (non HMO) | Admitting: Internal Medicine

## 2022-08-15 VITALS — BP 120/78 | HR 101 | Ht 63.0 in | Wt 198.8 lb

## 2022-08-15 DIAGNOSIS — D869 Sarcoidosis, unspecified: Secondary | ICD-10-CM

## 2022-08-15 DIAGNOSIS — E669 Obesity, unspecified: Secondary | ICD-10-CM

## 2022-08-15 MED ORDER — PREDNISONE 10 MG PO TABS: ORAL_TABLET | ORAL | 0 refills | Status: DC

## 2022-08-15 NOTE — Assessment & Plan Note (Signed)
She indicates she is not working with [facility]

## 2022-08-15 NOTE — Patient Instructions (Signed)
For 2 weeks, alternate 20 mg with 15 mg every other day.  Then try 15 mg every day

## 2022-08-15 NOTE — Assessment & Plan Note (Signed)
We are going to try reducing prednisone starting with 2 weeks alternating 20 mg / 15 mg every other day, then 15 mg daily.  Adrenal insufficiency discussed.

## 2022-08-17 ENCOUNTER — Encounter: Payer: Self-pay | Admitting: Internal Medicine

## 2022-08-24 ENCOUNTER — Other Ambulatory Visit: Payer: Self-pay | Admitting: Family

## 2022-08-24 DIAGNOSIS — Z1231 Encounter for screening mammogram for malignant neoplasm of breast: Secondary | ICD-10-CM

## 2022-09-02 ENCOUNTER — Ambulatory Visit (HOSPITAL_COMMUNITY)
Admission: RE | Admit: 2022-09-02 | Discharge: 2022-09-02 | Disposition: A | Discharge status: Home / Self Care | Payer: BC Managed Care – PPO | Source: Ambulatory Visit | Attending: Internal Medicine | Admitting: Internal Medicine

## 2022-09-02 DIAGNOSIS — K449 Diaphragmatic hernia without obstruction or gangrene: Secondary | ICD-10-CM | POA: Diagnosis not present

## 2022-09-02 DIAGNOSIS — J84112 Idiopathic pulmonary fibrosis: Secondary | ICD-10-CM | POA: Diagnosis not present

## 2022-09-02 DIAGNOSIS — D869 Sarcoidosis, unspecified: Secondary | ICD-10-CM | POA: Insufficient documentation

## 2022-09-02 DIAGNOSIS — J929 Pleural plaque without asbestos: Secondary | ICD-10-CM | POA: Diagnosis not present

## 2022-09-14 ENCOUNTER — Ambulatory Visit (INDEPENDENT_AMBULATORY_CARE_PROVIDER_SITE_OTHER): Payer: No Typology Code available for payment source | Admitting: Internal Medicine

## 2022-09-14 ENCOUNTER — Encounter (INDEPENDENT_AMBULATORY_CARE_PROVIDER_SITE_OTHER): Payer: Self-pay | Admitting: Internal Medicine

## 2022-09-14 VITALS — BP 114/80 | HR 100 | Temp 98.5°F | Ht 63.0 in | Wt 193.0 lb

## 2022-09-14 DIAGNOSIS — R7303 Prediabetes: Secondary | ICD-10-CM

## 2022-09-14 DIAGNOSIS — Z87891 Personal history of nicotine dependence: Secondary | ICD-10-CM

## 2022-09-14 DIAGNOSIS — D869 Sarcoidosis, unspecified: Secondary | ICD-10-CM

## 2022-09-14 DIAGNOSIS — E669 Obesity, unspecified: Secondary | ICD-10-CM | POA: Diagnosis not present

## 2022-09-14 DIAGNOSIS — R5383 Other fatigue: Secondary | ICD-10-CM

## 2022-09-14 DIAGNOSIS — Z7952 Long term (current) use of systemic steroids: Secondary | ICD-10-CM

## 2022-09-14 DIAGNOSIS — R0602 Shortness of breath: Secondary | ICD-10-CM

## 2022-09-14 DIAGNOSIS — Z6834 Body mass index (BMI) 34.0-34.9, adult: Secondary | ICD-10-CM | POA: Diagnosis not present

## 2022-09-14 DIAGNOSIS — Z1331 Encounter for screening for depression: Secondary | ICD-10-CM | POA: Diagnosis not present

## 2022-09-15 LAB — COMPREHENSIVE METABOLIC PANEL
ALT: 27 IU/L (ref 0–32)
AST: 39 IU/L (ref 0–40)
Albumin/Globulin Ratio: 1.1 — ABNORMAL LOW (ref 1.2–2.2)
Albumin: 4.6 g/dL (ref 3.8–4.9)
Alkaline Phosphatase: 65 IU/L (ref 44–121)
BUN/Creatinine Ratio: 12 (ref 9–23)
BUN: 10 mg/dL (ref 6–24)
Bilirubin Total: 0.3 mg/dL (ref 0.0–1.2)
CO2: 24 mmol/L (ref 20–29)
Calcium: 9.7 mg/dL (ref 8.7–10.2)
Chloride: 99 mmol/L (ref 96–106)
Creatinine, Ser: 0.83 mg/dL (ref 0.57–1.00)
Globulin, Total: 4.2 g/dL (ref 1.5–4.5)
Glucose: 92 mg/dL (ref 70–99)
Potassium: 4.5 mmol/L (ref 3.5–5.2)
Sodium: 137 mmol/L (ref 134–144)
Total Protein: 8.8 g/dL — ABNORMAL HIGH (ref 6.0–8.5)
eGFR: 85 mL/min/{1.73_m2} (ref >59)

## 2022-09-15 LAB — HEMOGLOBIN A1C
Est. average glucose Bld gHb Est-mCnc: 134 mg/dL
Hgb A1c MFr Bld: 6.3 % — ABNORMAL HIGH (ref 4.8–5.6)

## 2022-09-15 LAB — CBC WITH DIFFERENTIAL/PLATELET
Basophils Absolute: 0 10*3/uL (ref 0.0–0.2)
Basos: 1 %
EOS (ABSOLUTE): 0.1 10*3/uL (ref 0.0–0.4)
Eos: 2 %
Hematocrit: 43.9 % (ref 34.0–46.6)
Hemoglobin: 14 g/dL (ref 11.1–15.9)
Immature Grans (Abs): 0 10*3/uL (ref 0.0–0.1)
Immature Granulocytes: 0 %
Lymphocytes Absolute: 1.8 10*3/uL (ref 0.7–3.1)
Lymphs: 48 %
MCH: 28.5 pg (ref 26.6–33.0)
MCHC: 31.9 g/dL (ref 31.5–35.7)
MCV: 89 fL (ref 79–97)
Monocytes Absolute: 0.4 10*3/uL (ref 0.1–0.9)
Monocytes: 10 %
Neutrophils Absolute: 1.5 10*3/uL (ref 1.4–7.0)
Neutrophils: 39 %
Platelets: 192 10*3/uL (ref 150–450)
RBC: 4.91 x10E6/uL (ref 3.77–5.28)
RDW: 12.1 % (ref 11.7–15.4)
WBC: 3.7 10*3/uL (ref 3.4–10.8)

## 2022-09-15 LAB — INSULIN, RANDOM: INSULIN: 9.1 u[IU]/mL (ref 2.6–24.9)

## 2022-09-15 LAB — LIPID PANEL WITH LDL/HDL RATIO
Cholesterol, Total: 293 mg/dL — ABNORMAL HIGH (ref 100–199)
HDL: 109 mg/dL (ref >39)
LDL Chol Calc (NIH): 166 mg/dL — ABNORMAL HIGH (ref 0–99)
LDL/HDL Ratio: 1.5 ratio (ref 0.0–3.2)
Triglycerides: 108 mg/dL (ref 0–149)
VLDL Cholesterol Cal: 18 mg/dL (ref 5–40)

## 2022-09-15 LAB — VITAMIN B12: Vitamin B-12: 444 pg/mL (ref 232–1245)

## 2022-09-15 LAB — VITAMIN D 25 HYDROXY (VIT D DEFICIENCY, FRACTURES): Vit D, 25-Hydroxy: 22.3 ng/mL — ABNORMAL LOW (ref 30.0–100.0)

## 2022-09-15 LAB — TSH: TSH: 1.44 u[IU]/mL (ref 0.450–4.500)

## 2022-09-21 NOTE — Progress Notes (Signed)
Patient ID: Nancy Villarreal. Nancy Villarreal, female    DOB: 06-29-1971, 52 y.o.   MRN: [id number]  HPI F former smoker followed here with sarcoid Stage III,  Bronch NEG 09/24/08- dx based on mediastinal adenopathy/ interstitial disease,chronic elev ACE , response to steroid. Complicated by Iron def anemia, plasma cell disorder. Bronch NEG 09/24/08- dx based on mediastinal adenopathy/ interstitial disease, ACE 90, response to steroids. PFT 10/11/16- ACE 09/30/16- [place]  ACE 12/07/17- 97, up from 89 in 2018 ACE 01/08/19- 67 WNL ACE 08/04/20- 80H Immunoglobulins 12/19/16- IgG 2,669 ----------------------------------------------------------------------------------------------------   08/15/22-  51yoF former smoker(2.5 pk yrs) followed here with Sarcoid Stage III ( Bronch NE 09/24/08-G dx based on mediastinal adenopathy/ interstitial disease, chronic elevation of ACE, response to steroid),Bronch + 05/06/22,  Hemoptysis, Bronchitis, Complicated by Iron def anemia, plasma cell disorder -Albuterol hfa, prednisone 20 mg daily started 9/27,  Short term Disability -status? Covid vax- 6 Phizer Flu vax-had Has asked PCP for [facility] for Sarcoid- Lab 4/25- ACE 48 wnl Body weight today 198 lbs Pending HRCT 12/29 -----Pt is doing better no longer coughing up blood She has been on prednisone 20 mg daily since the end of September and is fighting weight gain and blood sugar.  Not working with [facility].  We discussed adrenal function and effort to reduce her steroid therapy.  We discussed availability of methotrexate as an alternative.  09/22/22- 49yoF former smoker(2.5 pk yrs) followed here with Sarcoid Stage III ( Bronch Pos 05/06/22- Nonnecrotizing granulomas,  mediastinal adenopathy/ interstitial disease, chronic elevation of ACE, response to steroid),,  Hemoptysis, Bronchitis, Complicated by Iron def anemia, plasma cell disorder -Albuterol hfa, prednisone 20 mg alt 10 mg qod, Covid vax- 6  Phizer Flu vax-had Has asked PCP for [place] for Sarcoid- Lab 4/25- ACE 48 wnl Body weight today  -Pt states she has been coughing up yellow/green phlegm, nasal congestion, sore throat, aching. No fever and has had a negative covid test. Symptoms started Sunday Long-term disability approved. She has been alternating prednisone with 15 mg with 20 mg every other day.  [name] discussed steroid management and would like to try reducing to alternate 10 mg with 20 mg every other day.  Current regimen she has not been hemoptysizing. Now for past 4 days she has had an acute bronchitis syndrome describing green and yellow sputum without definite fever. HRCT chest 09/07/22- IMPRESSION: 1. Unchanged moderate pulmonary fibrosis, in an upper lobe and perihilar predominant pattern, featuring irregular peribronchovascular and perilymphatic interstitial opacity and confluent nodularity as well as architectural distortion. Findings remain generally consistent with pulmonary sarcoidosis with some evidence of developing massive fibrosis. If characterized by ATS pulmonary fibrosis criteria, findings are suggestive of an alternative diagnosis (not UIP) per consensus guidelines: Diagnosis of Idiopathic Pulmonary Fibrosis: An Official ATS/ERS/JRS/ALAT Clinical Practice Guideline. [place], Iss 5, [phone], May 06 2017. 2. Numerous prominent, densely calcified mediastinal and hilar lymph nodes, in keeping with nodal sarcoidosis. 3. Diffuse bilateral bronchial wall thickening and lobular air trapping on expiratory phase imaging, consistent with small airways disease. 4. Enlargement of the main pulmonary artery, as can be seen in pulmonary hypertension. Aortic Atherosclerosis (ICD10-I70.0).                                // ONOX, ECHO next visit??//  Review of Systems-see HPI   + = positive HEENT:  No headaches,  Difficulty swallowing,  Tooth/dental problems,   Sore throat,                No sneezing, itching, ear ache, nasal congestion, post nasal drip,  CV:  No- chest pain,  No-PND, swelling in lower extremities, anasarca, dizziness, palpitations GI  No heartburn, indigestion, abdominal pain, nausea, vomiting,  Resp:  No acute shortness of breath with exertion .  No excess mucus, +productive cough,                  non-productive cough,  recent coughing up of blood. + change in color of mucus.  No wheezing.  Skin: no rash or lesions. GU: MS: joint pain .  No decreased range of motion.  No back pain. Psych:  No change in mood or affect. No depression or anxiety.  No memory loss.  Objective:   Physical Exam General- Alert, Oriented, Affect-appropriate, Distress- none acute.  Skin-   + mild acne Lymphadenopathy- none Head- atraumatic            Eyes- Gross vision intact, PERRLA, conjunctivae clear secretions            Ears- Hearing, canals normal            Nose- Clear, No-Septal dev, mucus, polyps, erosion, perforation             Throat- Mallampati II , mucosa-clear , drainage- none, tonsils present. , + hoarse from cough                    Neck- flexible , trachea midline, no stridor , thyroid nl, carotid no bruit Chest - symmetrical excursion , unlabored           Heart/CV- RRR , no murmur , no gallop  , no rub, nl s1 s2                           - JVD- none , edema- none, stasis changes- none, varices- none           Lung-   Cough-none, + somewhat coarse in bases, unlabored, wheeze- none,  dullness-none, rub- none           Chest wall-  Abd-  Br/ Gen/ Rectal- Not done, not indicated Extrem- cyanosis- none, clubbing, none, atrophy- none, strength- nl Neuro- grossly intact to observation

## 2022-09-22 ENCOUNTER — Encounter: Payer: Self-pay | Admitting: Internal Medicine

## 2022-09-22 ENCOUNTER — Ambulatory Visit: Payer: No Typology Code available for payment source | Admitting: Internal Medicine

## 2022-09-22 VITALS — BP 112/76 | HR 101 | Temp 99.0°F | Ht 63.0 in | Wt 197.8 lb

## 2022-09-22 DIAGNOSIS — J209 Acute bronchitis, unspecified: Secondary | ICD-10-CM | POA: Diagnosis not present

## 2022-09-22 DIAGNOSIS — D869 Sarcoidosis, unspecified: Secondary | ICD-10-CM | POA: Diagnosis not present

## 2022-09-22 MED ORDER — AZITHROMYCIN 250 MG PO TABS: ORAL_TABLET | ORAL | 0 refills | Status: DC

## 2022-09-22 NOTE — Patient Instructions (Signed)
Zpak antibiotic sent.  Ok to take otc cold and flu remedies.  When you get over this acute illness, try reducing prednisone to alternating 10 mg with 20 mg every other day.

## 2022-09-24 ENCOUNTER — Encounter: Payer: Self-pay | Admitting: Internal Medicine

## 2022-09-25 ENCOUNTER — Other Ambulatory Visit: Payer: Self-pay

## 2022-09-25 ENCOUNTER — Emergency Department (HOSPITAL_BASED_OUTPATIENT_CLINIC_OR_DEPARTMENT_OTHER): Payer: 59 | Admitting: Radiology

## 2022-09-25 ENCOUNTER — Encounter (HOSPITAL_BASED_OUTPATIENT_CLINIC_OR_DEPARTMENT_OTHER): Payer: Self-pay | Admitting: Emergency Medicine

## 2022-09-25 ENCOUNTER — Emergency Department (HOSPITAL_BASED_OUTPATIENT_CLINIC_OR_DEPARTMENT_OTHER)
Admission: EM | Admit: 2022-09-25 | Discharge: 2022-09-25 | Disposition: A | Discharge status: Home / Self Care | Payer: 59 | Attending: Emergency Medicine | Admitting: Emergency Medicine

## 2022-09-25 DIAGNOSIS — D869 Sarcoidosis, unspecified: Secondary | ICD-10-CM | POA: Diagnosis not present

## 2022-09-25 DIAGNOSIS — Z20822 Contact with and (suspected) exposure to covid-19: Secondary | ICD-10-CM | POA: Insufficient documentation

## 2022-09-25 DIAGNOSIS — R042 Hemoptysis: Secondary | ICD-10-CM | POA: Diagnosis not present

## 2022-09-25 DIAGNOSIS — Z87891 Personal history of nicotine dependence: Secondary | ICD-10-CM | POA: Diagnosis not present

## 2022-09-25 LAB — RESP PANEL BY RT-PCR (RSV, FLU A&B, COVID)  RVPGX2
Influenza A by PCR: NEGATIVE
Influenza B by PCR: NEGATIVE
Resp Syncytial Virus by PCR: NEGATIVE
SARS Coronavirus 2 by RT PCR: NEGATIVE

## 2022-09-25 LAB — CBC WITH DIFFERENTIAL/PLATELET
Abs Immature Granulocytes: 0.02 10*3/uL (ref 0.00–0.07)
Basophils Absolute: 0 10*3/uL (ref 0.0–0.1)
Basophils Relative: 0 %
Eosinophils Absolute: 0 10*3/uL (ref 0.0–0.5)
Eosinophils Relative: 1 %
HCT: 40.3 % (ref 36.0–46.0)
Hemoglobin: 13.1 g/dL (ref 12.0–15.0)
Immature Granulocytes: 0 %
Lymphocytes Relative: 51 %
Lymphs Abs: 3.1 10*3/uL (ref 0.7–4.0)
MCH: 28.5 pg (ref 26.0–34.0)
MCHC: 32.5 g/dL (ref 30.0–36.0)
MCV: 87.8 fL (ref 80.0–100.0)
Monocytes Absolute: 0.4 10*3/uL (ref 0.1–1.0)
Monocytes Relative: 6 %
Neutro Abs: 2.5 10*3/uL (ref 1.7–7.7)
Neutrophils Relative %: 42 %
Platelets: 229 10*3/uL (ref 150–400)
RBC: 4.59 MIL/uL (ref 3.87–5.11)
RDW: 12.3 % (ref 11.5–15.5)
WBC: 6.1 10*3/uL (ref 4.0–10.5)
nRBC: 0 % (ref 0.0–0.2)

## 2022-09-25 LAB — BASIC METABOLIC PANEL
Anion gap: 7 (ref 5–15)
BUN: 16 mg/dL (ref 6–20)
CO2: 27 mmol/L (ref 22–32)
Calcium: 9.4 mg/dL (ref 8.9–10.3)
Chloride: 102 mmol/L (ref 98–111)
Creatinine, Ser: 0.83 mg/dL (ref 0.44–1.00)
GFR, Estimated: >60 mL/min (ref >60)
Glucose, Bld: 86 mg/dL (ref 70–99)
Potassium: 3.6 mmol/L (ref 3.5–5.1)
Sodium: 136 mmol/L (ref 135–145)

## 2022-09-25 MED ORDER — PREDNISONE 10 MG PO TABS: 40.0000 mg | ORAL_TABLET | Freq: Every day | ORAL | 0 refills | Status: DC

## 2022-09-25 MED ORDER — PREDNISONE 50 MG PO TABS
60.0000 mg | ORAL_TABLET | Freq: Once | ORAL | Status: AC
  Administered 2022-09-25: 60 mg via ORAL
  Filled 2022-09-25: qty 1

## 2022-09-25 NOTE — ED Triage Notes (Addendum)
   Patient comes in with 3 day hx of hemoptysis.  Patient states she has sarcoidosis and just got over a respiratory infection.  Patient was given steroids and finished dose pack.  Patient noticed bright red blood in her sputum after getting over respiratory infection.  No pain at this time.   Patient also states she is currently having a heavy menstrual cycle after not having one for the past 8 months.

## 2022-09-25 NOTE — Discharge Instructions (Addendum)
Increase your prednisone take 40 mg daily for the next 5 days starting tomorrow.  You got a dose of 60 mg here today.  Follow-up with pulmonary medicine at least by phone call on Monday.  Return for increased coughing up blood that amounts to 1 cooking cup a day or for any new or worse symptoms.  Follow-up on your respiratory panel on MyChart.

## 2022-09-25 NOTE — ED Provider Notes (Signed)
[place] EMERGENCY DEPARTMENT AT [facility] Provider Note   CSN: [id number] Arrival date & time: 09/25/22  7829     History  Chief Complaint  Patient presents with   Hemoptysis    Nancy Villarreal is a 52 y.o. female.  Patient with history of sarcoidosis followed by pulmonary medicine.  Patient is on prednisone every other day recently increase that to 30 mg yesterday.  Patient was seen by pulmonary medicine January 18 started on the antibiotic Zithromycin for some of the exacerbation.  Patient states she is coughing up about a third of a cup of blood in a day.  No shortness of breath.  No lightheadedness.  Patient is not on blood thinners.  Past medical history significant for the sarcoidosis pulmonary hypertension history of hemoptysis.  Patient states she is definitely coughed up blood before this is just a little bit more than usual.  Patient is a former smoker quit in 2001     Home Medications Prior to Admission medications   Medication Sig Start Date End Date Taking? Authorizing Provider  azithromycin (ZITHROMAX) 250 MG tablet 2 today then one daily 09/22/22   Jetty Duhamel D, MD  Biotin 56213 MCG TABS Take 10,000 mcg by mouth daily.    [provider]  Cholecalciferol (VITAMIN D3) 50 MCG (2000 UT) TABS Take 50 mcg by mouth daily at 12 noon.    [provider]  iron polysaccharides (NIFEREX) 150 MG capsule TAKE 1 CAPSULE(150 MG) BY MOUTH DAILY 05/26/22   Olive Bass, FNP  predniSONE (DELTASONE) 10 MG tablet Alternate 20 mg with 15 mg or as directed 08/15/22   Waymon Budge, MD  Prenatal Vit-Fe Fumarate-FA (PRENATAL MULTIVITAMIN) TABS tablet Take 2 tablets by mouth daily at 12 noon.    [provider]  traMADol (ULTRAM) 50 MG tablet Take 1 tablet (50 mg total) by mouth every 6 (six) hours as needed. 03/28/22   Jetty Duhamel D, MD  traZODone (DESYREL) 50 MG tablet TAKE 1 TABLET(50 MG) BY MOUTH AT BEDTIME AS NEEDED FOR SLEEP  07/11/22   Olive Bass, FNP  venlafaxine XR [facility]) 150 MG 24 hr capsule Take 1 capsule (150 mg total) by mouth daily with breakfast. 03/11/22   Olive Bass, FNP      Allergies    Hydrocodone    Review of Systems   Review of Systems  Constitutional:  Negative for chills and fever.  HENT:  Negative for rhinorrhea and sore throat.   Eyes:  Negative for visual disturbance.  Respiratory:  Negative for cough and shortness of breath.   Cardiovascular:  Negative for chest pain and leg swelling.  Gastrointestinal:  Negative for abdominal pain, diarrhea, nausea and vomiting.  Genitourinary:  Negative for dysuria.  Musculoskeletal:  Negative for back pain and neck pain.  Skin:  Negative for rash.  Neurological:  Negative for dizziness, light-headedness and headaches.  Hematological:  Does not bruise/bleed easily.  Psychiatric/Behavioral:  Negative for confusion.     Physical Exam Updated Vital Signs BP 125/84 (BP Location: Left Arm)   Pulse 85   Temp 99 F (37.2 C) (Oral)   Resp 19   Ht 1.6 m (5\' 3" )   Wt 89.4 kg   LMP 07/28/2022   SpO2 97%   BMI 34.90 kg/m  Physical Exam Vitals and nursing note reviewed.  Constitutional:      General: She is not in acute distress.    Appearance: Normal appearance. She is well-developed.  HENT:     Head: Normocephalic and atraumatic.     Mouth/Throat:     Mouth: Mucous membranes are moist.     Pharynx: Oropharynx is clear. No oropharyngeal exudate or posterior oropharyngeal erythema.     Comments: No blood in the posterior pharynx. Eyes:     Extraocular Movements: Extraocular movements intact.     Conjunctiva/sclera: Conjunctivae normal.     Pupils: Pupils are equal, round, and reactive to light.  Cardiovascular:     Rate and Rhythm: Normal rate and regular rhythm.     Heart sounds: No murmur heard. Pulmonary:     Effort: Pulmonary effort is normal. No respiratory distress.     Breath sounds: Normal breath sounds.  No wheezing, rhonchi or rales.  Abdominal:     Palpations: Abdomen is soft.     Tenderness: There is no abdominal tenderness.  Musculoskeletal:        General: No swelling.     Cervical back: Neck supple.  Skin:    General: Skin is warm and dry.     Capillary Refill: Capillary refill takes less than 2 seconds.  Neurological:     General: No focal deficit present.     Mental Status: She is alert and oriented to person, place, and time.  Psychiatric:        Mood and Affect: Mood normal.     ED Results / Procedures / Treatments   Labs (all labs ordered are listed, but only abnormal results are displayed) Labs Reviewed  RESP PANEL BY RT-PCR (RSV, FLU A&B, COVID)  [id number]  BASIC METABOLIC PANEL  CBC WITH DIFFERENTIAL/PLATELET    EKG EKG Interpretation  Date/Time:  Sunday September 25 2022 07:15:05 EST Ventricular Rate:  83 PR Interval:  127 QRS Duration: 78 QT Interval:  404 QTC Calculation: 475 R Axis:   81 Text Interpretation: Sinus rhythm Confirmed by Fredia Sorrow [phone]) on 09/25/2022 7:18:30 AM  Radiology DG Chest 2 View  Result Date: 09/25/2022 CLINICAL DATA:  Hemoptysis with history of sarcoid EXAM: CHEST - 2 VIEW COMPARISON:  09/02/2022 chest CT FINDINGS: Reticular and nodular opacities especially in the upper lungs correlating with the history and prior CT. Normal heart size and mediastinal contours. No acute airspace disease, edema, effusion, or pneumothorax. IMPRESSION: Chronic lung disease without acute or focal finding. Electronically Signed   By: Jorje Guild M.D.   On: 09/25/2022 07:05    Procedures Procedures    Medications Ordered in ED Medications  predniSONE (DELTASONE) tablet 60 mg (has no administration in time range)    ED Course/ Medical Decision Making/ A&P                             Medical Decision Making  Will go increased prednisone here give 60 mg dose here.  May need to do 40 mg for the next 5 days to help control.  Patient's  hemoptysis not severe.  Patient without any hypoxia oxygen saturations are 100% blood pressure 125/85.  Temp up a little bit at 99 but not a true fever.  Respirations 20.  Lungs are clear bilaterally.  Basic metabolic panel normal to include renal function.  CBC white count 6.1 hemoglobin 13.1 platelets 229.  Nursing did RSV panel.  Patient can follow that up on MyChart.  Chest x-ray chronic lung disease without acute or focal findings.  Patient nontoxic no acute distress.  Feel that we can probably increase her steroids  and have her follow-up with pulmonary medicine later this week. Final Clinical Impression(s) / ED Diagnoses Final diagnoses:  Sarcoidosis  Hemoptysis    Rx / DC Orders ED Discharge Orders     None         Fredia Sorrow, MD 09/25/22 [phone]

## 2022-09-26 NOTE — Progress Notes (Signed)
Chief Complaint:   Nancy Villarreal (MR# [id number]) is a 52 y.o. female who presents for evaluation and treatment of obesity and related comorbidities. Current BMI is Body mass index is 34.19 kg/m. Nancy Villarreal has been struggling with her weight for many years and has been unsuccessful in either losing weight, maintaining weight loss, or reaching her healthy weight goal.  Nancy Villarreal is currently in the action stage of change and ready to dedicate time achieving and maintaining a healthier weight. Nancy Villarreal is interested in becoming our patient and working on intensive lifestyle modifications including (but not limited to) diet and exercise for weight loss.  Nancy Villarreal's habits were reviewed today and are as follows: Her family eats meals together, she thinks her family will eat healthier with her, she struggles with family and or coworkers weight loss sabotage, her desired weight loss is 48 lbs, she started gaining weight after giving birth, her heaviest weight ever was 250 pounds, she is a picky eater and doesn't like to eat healthier foods, she has significant food cravings issues, she snacks frequently in the evenings, she wakes up frequently in the middle of the night to eat, she skips meals frequently, she is frequently drinking liquids with calories, she has problems with excessive hunger, and she struggles with emotional eating.  Depression Screen Nancy Villarreal's Food and Mood (modified PHQ-9) score was 4.  Subjective:   1. Other fatigue Elna admits to daytime somnolence and denies waking up still tired. Patient has a history of symptoms of daytime fatigue and morning headache. Nancy Villarreal generally gets 7 or 9 hours of sleep per night, and states that she has generally restful sleep. Snoring is present. Apneic episodes are not present. Epworth Sleepiness Score is 4.   2. SOB (shortness of breath) on exertion Nancy Villarreal notes increasing shortness of breath with exercising and seems to be  worsening over time with weight gain. She notes getting out of breath sooner with activity than she used to. This has not gotten worse recently. Nancy Villarreal denies shortness of breath at rest or orthopnea.  3. Sarcoidosis ([place]) Worsening per the patient. Reviewed most recent CT which showed massive fibrosis unchanged. She is on chronic prednisone. I discussed labs with the patient today.   4. Current chronic use of systemic steroids Patient had a bone density in the past, done out of the system. She had not been taking calcium or Vitamin D. Not on pharmaco-prophylaxis.   5. Prediabetes Patient is pre-diabetes per history.   76. Former smoker Patient does not qualify for lung cancer screening.   Assessment/Plan:   1. Other fatigue Holy does feel that her weight is causing her energy to be lower than it should be. Fatigue may be related to obesity, depression or many other causes. Labs will be ordered, and in the meanwhile, Nancy Villarreal will focus on self care including making healthy food choices, increasing physical activity and focusing on stress reduction.  - EKG 12-Lead - Vitamin B12 - CBC with Differential/Platelet  2. SOB (shortness of breath) on exertion Nancy Villarreal does feel that she gets out of breath more easily that she used to when she exercises. Nancy Villarreal's shortness of breath appears to be obesity related and exercise induced. She has agreed to work on weight loss and gradually increase exercise to treat her exercise induced shortness of breath. Will continue to monitor closely.  3. Sarcoidosis ([place]) She will work on her weight loss therapy, as this should help with restriction. Continue prednisone (see below).  4. Current chronic use of systemic steroids Patient is to start Vitamin D supplementation. We will check Ca+ and Vitamin D levels. Talked about weight bearing exercises.   5. Prediabetes We will check labs today. She will work on her weight loss therapy and increase physical  activity. Consider metformin.   - Hemoglobin A1c - Insulin, random  6. Former smoker Patient will continue with cessation.   7. Depression screening Nancy Villarreal had a negative depression screening.   8. Obesity with current BMI of 34.3 We will check labs today.   - Comprehensive metabolic panel - TSH - VITAMIN D 25 Hydroxy (Vit-D Deficiency, Fractures) - Lipid Panel With LDL/HDL Ratio  Nancy Villarreal is currently in the action stage of change and her goal is to continue with weight loss efforts. I recommend Carron begin the structured treatment plan as follows:  She has agreed to the Category 2 Plan.  Exercise goals: As is.    Behavioral modification strategies: increasing lean protein intake, decreasing simple carbohydrates, increasing vegetables, increasing water intake, decreasing liquid calories, increasing high fiber foods, no skipping meals, meal planning and cooking strategies, better snacking choices, avoiding temptations, and planning for success.  She was informed of the importance of frequent follow-up visits to maximize her success with intensive lifestyle modifications for her multiple health conditions. She was informed we would discuss her lab results at her next visit unless there is a critical issue that needs to be addressed sooner. Nancy Villarreal agreed to keep her next visit at the agreed upon time to discuss these results.  Objective:   Blood pressure 114/80, pulse 100, temperature 98.5 F (36.9 C), height 5\' 3"  (1.6 m), weight 193 lb (87.5 kg), last menstrual period 07/28/2022, SpO2 96 %. Body mass index is 34.19 kg/m.  EKG: Normal sinus rhythm, rate 99 BPM.  Indirect Calorimeter completed today shows a VO2 of 217 and a REE of 1498.  Her calculated basal metabolic rate is 4782 thus her basal metabolic rate is worse than expected.  General: Cooperative, alert, well developed, in no acute distress. HEENT: Conjunctivae and lids unremarkable. Cardiovascular: Regular rhythm.   Lungs: Normal work of breathing. Neurologic: No focal deficits.   Lab Results  Component Value Date   CREATININE 0.83 09/25/2022   BUN 16 09/25/2022   NA 136 09/25/2022   K 3.6 09/25/2022   CL 102 09/25/2022   CO2 27 09/25/2022   Lab Results  Component Value Date   ALT 27 09/14/2022   AST 39 09/14/2022   ALKPHOS 65 09/14/2022   BILITOT 0.3 09/14/2022   Lab Results  Component Value Date   HGBA1C 6.3 (H) 09/14/2022   HGBA1C 6.2 09/09/2021   Lab Results  Component Value Date   INSULIN 9.1 09/14/2022   Lab Results  Component Value Date   TSH 1.440 09/14/2022   Lab Results  Component Value Date   CHOL 293 (H) 09/14/2022   HDL 109 09/14/2022   LDLCALC 166 (H) 09/14/2022   TRIG 108 09/14/2022   CHOLHDL 2.4 04/03/2020   Lab Results  Component Value Date   WBC 6.1 09/25/2022   HGB 13.1 09/25/2022   HCT 40.3 09/25/2022   MCV 87.8 09/25/2022   PLT 229 09/25/2022   Lab Results  Component Value Date   IRON 48 04/22/2021   TIBC 433 04/22/2021   FERRITIN 16 04/22/2021   Attestation Statements:   Reviewed by clinician on day of visit: allergies, medications, problem list, medical history, surgical history, family history, social history, and  previous encounter notes.  Time spent on visit including pre-visit chart review and post-visit charting and care was 40 minutes.   Trude Mcburney, am acting as transcriptionist for Worthy Rancher, MD.  I have reviewed the above documentation for accuracy and completeness, and I agree with the above. -Worthy Rancher, MD

## 2022-09-26 NOTE — Telephone Encounter (Signed)
Received the following message from patient:   "Coughing up moderate amount of blood since Friday night. Having trouble controlling....increased predisone to 30mg  and started cough syrup to surpress cough. Seem to have helped."  I sent a message to check on her since per her chart, she went to the ED yesterday. I received the following response:   "Still coughing up a little blood but not nearly the amount I was over the weekend. Concerned about current level of prednisone. Should I proceed with ER recommendation? Where is this blood coming from? Is there a new issue concerning my lungs? ER was not concerned, but anytime I cough up blood I become very concerned.   Patient was discharged on 40mg  of prednisone once daily for 10 days.   Dr. Annamaria Boots, can you please advise? Thanks!

## 2022-09-26 NOTE — Telephone Encounter (Signed)
Blood is coming from small blood vessels in the lining of your airways, which is inflamed by the sarcoid. Using prednisone to reduce inflammation, and using cough suppressant to calm things down while blood clots seal the little leaks is the right thing to do. Sorry it keeps happening. Let us know if if doesn't get better. I know it is upsetting and I'm sorry.

## 2022-09-27 ENCOUNTER — Encounter: Payer: Self-pay | Admitting: Cardiovascular Disease

## 2022-09-27 ENCOUNTER — Ambulatory Visit
Payer: No Typology Code available for payment source | Attending: Cardiovascular Disease | Admitting: Cardiovascular Disease

## 2022-09-27 VITALS — BP 100/60 | HR 100 | Ht 63.0 in | Wt 201.6 lb

## 2022-09-27 DIAGNOSIS — R000 Tachycardia, unspecified: Secondary | ICD-10-CM | POA: Diagnosis not present

## 2022-09-27 DIAGNOSIS — I2729 Other secondary pulmonary hypertension: Secondary | ICD-10-CM | POA: Diagnosis not present

## 2022-09-27 DIAGNOSIS — E782 Mixed hyperlipidemia: Secondary | ICD-10-CM | POA: Diagnosis not present

## 2022-09-27 MED ORDER — ATORVASTATIN CALCIUM 20 MG PO TABS: 20.0000 mg | ORAL_TABLET | Freq: Every day | ORAL | 3 refills | Status: AC

## 2022-09-27 NOTE — Assessment & Plan Note (Signed)
History of hyperlipidemia not on statin therapy with lipid profile performed 09/14/2022 revealing total cholesterol 293, LDL 166 and HDL 109.  Her coronary calcium score was 0.  I am in the start her on 20 mg of atorvastatin a day and we will recheck a lipid liver profile in 3 months.

## 2022-09-27 NOTE — Assessment & Plan Note (Signed)
History of sinus tachycardia with heart rate in the 100 range.  I did do a monitor on her 01/11/2022 that showed no arrhythmias with heart rates that varied between 69 and 139 with an average of 98.

## 2022-09-27 NOTE — Assessment & Plan Note (Signed)
2D echo performed 11/18/2021 showed mild pulmonary hypertension with a RV systolic pressure 38 mmHg.  Her CT scan performed 09/02/2022 did show dilatation of her pulmonary arteries.  I think either as a portion to be degree of mild pulmonary hypertension that she has.  I suspect this is related to her sarcoid lung disease followed by Dr. Annamaria Boots.

## 2022-09-27 NOTE — Patient Instructions (Signed)
Medication Instructions:  Your physician has recommended you make the following change in your medication:  -Start atorvastatin (lipitor) 20mg  once daily in the evening.   *If you need a refill on your cardiac medications before your next appointment, please call your pharmacy*   Lab Work: Your physician recommends that you return for lab work in: 3 months for FASTING lipid/liver panel.  If you have labs (blood work) drawn today and your tests are completely normal, you will receive your results only by: [place] (if you have MyChart) OR A paper copy in the mail If you have any lab test that is abnormal or we need to change your treatment, we will call you to review the results.   Follow-Up: At [facility], you and your health needs are our priority.  As part of our continuing mission to provide you with exceptional heart care, we have created designated Provider Care Teams.  These Care Teams include your primary Cardiologist (physician) and Advanced Practice Providers (APPs -  Physician Assistants and Nurse Practitioners) who all work together to provide you with the care you need, when you need it.  We recommend signing up for the patient portal called "MyChart".  Sign up information is provided on this After Visit Summary.  MyChart is used to connect with patients for Virtual Visits (Telemedicine).  Patients are able to view lab/test results, encounter notes, upcoming appointments, etc.  Non-urgent messages can be sent to your provider as well.   To learn more about what you can do with MyChart, go to [email or web address].    Your next appointment:   12 month(s)  Provider:   Quay Burow, MD

## 2022-09-27 NOTE — Progress Notes (Signed)
[address]   8[birth year]4  [id number]  Primary Physician Marrian Salvage, [place] Primary Cardiologist: Lorretta Harp MD FACP, [place], [place], [place]  HPI:  Nancy Villarreal is a 52 y.o.  mild to moderately overweight married African-American female mother of 2, grandmother of 4 grandchildren referred by Jodi Mourning, NP for evaluation of resting tachycardia.  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  I last saw her in the office 01/11/2022.  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 Annamaria Boots, pulmonology.  She basically has no cardiac risk factors except for mild untreated hyperlipidemia.  There is no family history for heart disease.  She is never had a heart attack or stroke.  She denies chest pain but does get some shortness of breath primarily related to her sarcoidosis.  She is noted to have a resting sinus tachycardia although she denies palpitations.  Her thyroid function tests are normal.   She did have a 2D echocardiogram on her and that showed normal LV systolic function with hypokinesia and basal mid anteroseptal wall and inferoseptal wall performed 11/18/2021.  As result of this I did get a coronary calcium score which was 0.  I suspect that her regional wall motion abnormalities may be related to sarcoidosis involvement of her myocardium.  She also has a resting sinus tachycardia with which probably is related to this as well.  I did a event monitor on her 01/11/2022 that was essentially normal with an average heart rate of 98.  Her sarcoid has become symptomatic recently with hemoptysis.  She is been put back on steroids and is followed by Dr. Annamaria Boots, her pulmonologist.  Since I saw her 8 months ago she was seen in the ER with.  I suspect this is related to her sarcoid.  She had a chest CT performed in December that was notable for dilated pulmonary arteries although her pulmonary pressures by echo  11/18/2021 was only 38.  She specifically denies chest pain or shortness of breath.   Current Meds  Medication Sig   atorvastatin (LIPITOR) 20 MG tablet Take 1 tablet (20 mg total) by mouth daily.   azithromycin (ZITHROMAX) 250 MG tablet 2 today then one daily   Biotin 10000 MCG TABS Take 10,000 mcg by mouth daily.   Cholecalciferol (VITAMIN D3) 50 MCG (2000 UT) TABS Take 50 mcg by mouth daily at 12 noon.   iron polysaccharides (NIFEREX) 150 MG capsule TAKE 1 CAPSULE(150 MG) BY MOUTH DAILY   predniSONE (DELTASONE) 10 MG tablet Take 4 tablets (40 mg total) by mouth daily.   Prenatal Vit-Fe Fumarate-FA (PRENATAL MULTIVITAMIN) TABS tablet Take 2 tablets by mouth daily at 12 noon.   traMADol (ULTRAM) 50 MG tablet Take 1 tablet (50 mg total) by mouth every 6 (six) hours as needed.   traZODone (DESYREL) 50 MG tablet TAKE 1 TABLET(50 MG) BY MOUTH AT BEDTIME AS NEEDED FOR SLEEP   venlafaxine XR (EFFEXOR-XR) 150 MG 24 hr capsule Take 1 capsule (150 mg total) by mouth daily with breakfast.     Allergies  Allergen Reactions   Hydrocodone Nausea And Vomiting    Social History   Socioeconomic History   Marital status: Married    Spouse name: Alvester Chou   Number of children: Not on file   Years of education: Not on file   Highest education level: Not on file  Occupational History  Occupation: Geophysicist/field seismologist   Occupation: Presenter, broadcasting  Tobacco Use   Smoking status: Former    Packs/day: 0.25    Years: 10.00    Total pack years: 2.50    Types: Cigarettes    Start date: 2001    Quit date: 08/05/2008    Years since quitting: 14.1    Passive exposure: Past   Smokeless tobacco: Never  Vaping Use   Vaping Use: Never used  Substance and Sexual Activity   Alcohol use: Yes   Drug use: Never   Sexual activity: Not Currently    Birth control/protection: Surgical  Other Topics Concern   Not on file  Social History Narrative   ** Merged History Encounter **       Social Determinants  of Health   Financial Resource Strain: Not on file  Food Insecurity: Not on file  Transportation Needs: Not on file  Physical Activity: Not on file  Stress: Not on file  Social Connections: Not on file  Intimate Partner Violence: Not on file     Review of Systems: General: negative for chills, fever, night sweats or weight changes.  Cardiovascular: negative for chest pain, dyspnea on exertion, edema, orthopnea, palpitations, paroxysmal nocturnal dyspnea or shortness of breath Dermatological: negative for rash Respiratory: negative for cough or wheezing Urologic: negative for hematuria Abdominal: negative for nausea, vomiting, diarrhea, bright red blood per rectum, melena, or hematemesis Neurologic: negative for visual changes, syncope, or dizziness All other systems reviewed and are otherwise negative except as noted above.    Blood pressure 100/60, pulse 100, height 5\' 3"  (1.6 m), weight 201 lb 9.6 oz (91.4 kg), last menstrual period 07/28/2022, SpO2 98 %.  General appearance: alert and no distress Neck: no adenopathy, no carotid bruit, no JVD, supple, symmetrical, trachea midline, and thyroid not enlarged, symmetric, no tenderness/mass/nodules Lungs: clear to auscultation bilaterally Heart: regular rate and rhythm, S1, S2 normal, no murmur, click, rub or gallop Extremities: extremities normal, atraumatic, no cyanosis or edema Pulses: 2+ and symmetric Skin: Skin color, texture, turgor normal. No rashes or lesions Neurologic: Grossly normal  EKG not performed today  ASSESSMENT AND PLAN:   Sinus tachycardia History of sinus tachycardia with heart rate in the 100 range.  I did do a monitor on her 01/11/2022 that showed no arrhythmias with heart rates that varied between 69 and 139 with an average of 98.  Hyperlipidemia History of hyperlipidemia not on statin therapy with lipid profile performed 09/14/2022 revealing total cholesterol 293, LDL 166 and HDL 109.  Her coronary calcium  score was 0.  I am in the start her on 20 mg of atorvastatin a day and we will recheck a lipid liver profile in 3 months.  Other secondary pulmonary hypertension ([place]) 2D echo performed 11/18/2021 showed mild pulmonary hypertension with a RV systolic pressure 38 mmHg.  Her CT scan performed 09/02/2022 did show dilatation of her pulmonary arteries.  I think either as a portion to be degree of mild pulmonary hypertension that she has.  I suspect this is related to her sarcoid lung disease followed by Dr. Annamaria Boots.     Lorretta Harp MD FACP,FACC,FAHA, [facility] 09/27/2022 10:56 AM

## 2022-09-28 ENCOUNTER — Encounter (INDEPENDENT_AMBULATORY_CARE_PROVIDER_SITE_OTHER): Payer: Self-pay | Admitting: Internal Medicine

## 2022-09-28 ENCOUNTER — Ambulatory Visit (INDEPENDENT_AMBULATORY_CARE_PROVIDER_SITE_OTHER): Payer: No Typology Code available for payment source | Admitting: Internal Medicine

## 2022-09-28 VITALS — BP 118/82 | HR 92 | Temp 98.4°F | Ht 63.0 in

## 2022-09-28 DIAGNOSIS — R042 Hemoptysis: Secondary | ICD-10-CM

## 2022-09-28 DIAGNOSIS — Z6834 Body mass index (BMI) 34.0-34.9, adult: Secondary | ICD-10-CM

## 2022-09-28 DIAGNOSIS — E559 Vitamin D deficiency, unspecified: Secondary | ICD-10-CM | POA: Diagnosis not present

## 2022-09-28 DIAGNOSIS — Z87891 Personal history of nicotine dependence: Secondary | ICD-10-CM | POA: Diagnosis not present

## 2022-09-28 DIAGNOSIS — E669 Obesity, unspecified: Secondary | ICD-10-CM

## 2022-09-28 DIAGNOSIS — Z7952 Long term (current) use of systemic steroids: Secondary | ICD-10-CM

## 2022-09-28 DIAGNOSIS — R7303 Prediabetes: Secondary | ICD-10-CM | POA: Diagnosis not present

## 2022-09-28 DIAGNOSIS — D869 Sarcoidosis, unspecified: Secondary | ICD-10-CM

## 2022-09-28 DIAGNOSIS — E7800 Pure hypercholesterolemia, unspecified: Secondary | ICD-10-CM | POA: Diagnosis not present

## 2022-09-28 DIAGNOSIS — E66811 Obesity, class 1: Secondary | ICD-10-CM

## 2022-09-28 MED ORDER — METFORMIN HCL ER 500 MG PO TB24: 500.0000 mg | ORAL_TABLET | Freq: Every day | ORAL | 0 refills | Status: DC

## 2022-09-28 NOTE — Progress Notes (Unsigned)
Chief Complaint:   OBESITY Nancy Villarreal is here to discuss her progress with her obesity treatment plan along with follow-up of her obesity related diagnoses. Nancy Villarreal is on the Category 2 Plan and states she is following her eating plan approximately 40% of the time. Nancy Villarreal states she is doing 0 minutes 0 times per week.  Today's visit was #: 2 Starting weight: 193 lbs Starting date: 09/14/2022 Today's weight: 195 lbs Today's date: 09/28/2022 Total lbs lost to date: 0 Total lbs lost since last in-office visit: 0  Interim History: Nancy Villarreal presents today for follow-up.  Since last office visit she has gained 2 pounds.  Her adherence to prescribed reduced calorie meal plan is low.  She had presented to the emergency room recently because of hemoptysis.  She is now on high-dose steroids again.  When she has started prescribed nutrition plan she found it easy to follow.  But has a hard time following now because of medical problems.  She also notes that husband has been influencing her with some highly palatable desserts.  She denies consumption of liquid calories.  She reports cravings for carbs and problems with satiety.  She is currently not engaging in physical activity.  Subjective:   1. Prediabetes Most recent A1c is 6.3 with associated elevated insulin levels.  Patient informed of disease state and risk of progression. This may contribute to abnormal cravings, fatigue and diabetes complications without having diabetes.  2. Hemoptysis Patient is on trazodone and venlafaxine which along with the prednisone may affect platelet aggregation.  This may increase her risk of recurrent hemoptysis.  Patient is also concerned about physical activity as this may induce coughing.    3. Pure hypercholesterolemia Her total cholesterol was 293 but she has a very high HDL at 103.  Her LDL was 166.  She had a coronary artery calcium score of 0 I also calculated her cardiovascular risk which was 1.9% and  considered low.    4. Former smoker Reviewed smoking history she remains abstinent.    5. 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  Currently experiencing recurrent hemoptysis.    6. Current chronic use of systemic steroids Some of her weight gain is iatrogenic and steroid-induced.  I also feel that this may be contributing to her prediabetes.    7. Vitamin D deficiency Her vitamin D levels were 22.  She is now taking vitamin D3 2000 units daily and will continue.  Associated with excess adiposity and may result in leptin resistance and adipogenesis.    Assessment/Plan:   1. Prediabetes We reviewed treatment options which include weight loss of about 7 to 10% of body weight, increasing physical activity to 150 minutes a week of moderate intensity and pharmacotherapy with metformin.  She is agreeable to starting metformin.  - metFORMIN (GLUCOPHAGE-XR) 500 MG 24 hr tablet; Take 1 tablet (500 mg total) by mouth daily with breakfast.  Dispense: 30 tablet; Refill: 0  2. Hemoptysis She will speak with prescribing physician about doing a gradual taper of her venlafaxine to see if she still needs it.  We also counseled on the need for a long taper due to this medications significant discontinuation syndrome.  3. Pure hypercholesterolemia She has been started on statin therapy by her cardiologist but I am not sure if she benefits from medication.  She will review this with her cardiologist.  4. Former smoker She is not a candidate for lung cancer screening.  5. Sarcoidosis (HCC) Please see above.  She will follow-up with her pulmonologist.  She will benefit from getting an updated bone density test.  6. Current chronic use of systemic steroids Metformin will help but she will continue to taper steroids.  She also needs an updated DEXA scan if not done within the last 2 years.  I have recently started her on vitamin D supplementation.  She was found to have vitamin D  deficiency with a level of 22.  Patient also immunocompromised as she is on more than 7.5 mg a day and should make sure she is up-to-date on her immunizations.  7. Vitamin D deficiency We will check levels at 3 months.  8. Obesity with current BMI of 34.6 In addition to prescribed reduced calorie nutrition plan, increasing physical activity and behavioral strategies patient would benefit from pharmacotherapy.  After discussion of benefits, risks, common side effects, off label use she is agreeable to starting metformin 500 mg XR 1 tablet daily for incretin effect and diabetes prevention.  Nancy Villarreal is currently in the action stage of change. As such, her goal is to continue with weight loss efforts. She has agreed to the Category 2 Plan.   Exercise goals: Nancy Villarreal has been instructed to work up to 150 minutes of moderate intensity aerobic activity a week and strengthening exercises 2-3 times per week for cardiovascular health, weight loss maintenance and preservation of muscle mass.   Behavioral modification strategies: increasing lean protein intake, decreasing simple carbohydrates, increasing vegetables, increasing water intake, no skipping meals, meal planning and cooking strategies, keeping healthy foods in the home, avoiding temptations, and planning for success.  Nancy Villarreal has agreed to follow-up with our clinic in 2 weeks. She was informed of the importance of frequent follow-up visits to maximize her success with intensive lifestyle modifications for her multiple health conditions.   Objective:   Blood pressure 118/82, pulse 92, temperature 98.4 F (36.9 C), height 5\' 3"  (1.6 m), last menstrual period 07/28/2022, SpO2 99 %. Body mass index is 35.71 kg/m.  General: Cooperative, alert, well developed, in no acute distress. HEENT: Conjunctivae and lids unremarkable. Cardiovascular: Regular rhythm.  Lungs: Normal work of breathing. Neurologic: No focal deficits.   Lab Results  Component  Value Date   CREATININE 0.83 09/25/2022   BUN 16 09/25/2022   NA 136 09/25/2022   K 3.6 09/25/2022   CL 102 09/25/2022   CO2 27 09/25/2022   Lab Results  Component Value Date   ALT 27 09/14/2022   AST 39 09/14/2022   ALKPHOS 65 09/14/2022   BILITOT 0.3 09/14/2022   Lab Results  Component Value Date   HGBA1C 6.3 (H) 09/14/2022   HGBA1C 6.2 09/09/2021   Lab Results  Component Value Date   INSULIN 9.1 09/14/2022   Lab Results  Component Value Date   TSH 1.440 09/14/2022   Lab Results  Component Value Date   CHOL 293 (H) 09/14/2022   HDL 109 09/14/2022   LDLCALC 166 (H) 09/14/2022   TRIG 108 09/14/2022   CHOLHDL 2.4 04/03/2020   Lab Results  Component Value Date   VD25OH 22.3 (L) 09/14/2022   VD25OH 33.28 02/27/2019   Lab Results  Component Value Date   WBC 6.1 09/25/2022   HGB 13.1 09/25/2022   HCT 40.3 09/25/2022   MCV 87.8 09/25/2022   PLT 229 09/25/2022   Lab Results  Component Value Date   IRON 48 04/22/2021   TIBC 433 04/22/2021   FERRITIN 16 04/22/2021   Attestation Statements:   Reviewed by  clinician on day of visit: allergies, medications, problem list, medical history, surgical history, family history, social history, and previous encounter notes.  Time spent on visit including pre-visit chart review and post-visit care and charting was 40 minutes.   Wilhemena Durie, am acting as transcriptionist for Thomes Dinning, MD.  I have reviewed the above documentation for accuracy and completeness, and I agree with the above. -Thomes Dinning, MD

## 2022-09-29 DIAGNOSIS — R7303 Prediabetes: Secondary | ICD-10-CM | POA: Diagnosis not present

## 2022-10-17 ENCOUNTER — Ambulatory Visit (INDEPENDENT_AMBULATORY_CARE_PROVIDER_SITE_OTHER): Payer: No Typology Code available for payment source | Admitting: Internal Medicine

## 2022-10-17 ENCOUNTER — Encounter (INDEPENDENT_AMBULATORY_CARE_PROVIDER_SITE_OTHER): Payer: Self-pay | Admitting: Internal Medicine

## 2022-10-17 VITALS — BP 106/75 | HR 96 | Temp 98.1°F | Ht 63.0 in | Wt 190.0 lb

## 2022-10-17 DIAGNOSIS — R7303 Prediabetes: Secondary | ICD-10-CM | POA: Diagnosis not present

## 2022-10-17 DIAGNOSIS — E559 Vitamin D deficiency, unspecified: Secondary | ICD-10-CM

## 2022-10-17 DIAGNOSIS — Z6834 Body mass index (BMI) 34.0-34.9, adult: Secondary | ICD-10-CM

## 2022-10-17 DIAGNOSIS — E669 Obesity, unspecified: Secondary | ICD-10-CM | POA: Diagnosis not present

## 2022-10-17 MED ORDER — METFORMIN HCL ER 500 MG PO TB24: 500.0000 mg | ORAL_TABLET | Freq: Every day | ORAL | 0 refills | Status: DC

## 2022-10-17 NOTE — Progress Notes (Signed)
Office: [phone]  /  Fax: [phone]  WEIGHT SUMMARY AND BIOMETRICS  Medical Weight Loss Height: 5' 3"$  (1.6 m) Weight: 190 lb (86.2 kg) Temp: 98.1 F (36.7 C) Pulse Rate: 96 BP: 106/75 SpO2: 98 % Fasting: n Labs: n Today's Visit #: 3 Weight at Last VIsit: 195 lb Weight Lost Since Last Visit: 50 lb  Body Fat %: 41.8 % Fat Mass (lbs): 79.6 lbs Muscle Mass (lbs): 105.2 lbs Total Body Water (lbs): 69 lbs Visceral Fat Rating : 11 Peak Weight: 201 lb Starting Date: 09/14/22 Starting Weight: 193 lb Total Weight Loss (lbs): 5 lb (2.268 kg)    HPI  Chief Complaint: OBESITY  Nancy Villarreal is here to discuss her progress with her obesity treatment plan. She is on the the Category 2 Plan and states she is following her eating plan approximately 70 % of the time. She states she is walking 30 minutes 5 times per week.   Interval History:  Since last office visit she has lost 5 lbs. Metformin has helped with appetite.  adherence to prescribed reduced calorie healthy diet is good. Reports problems with appetite and hunger signals at night Denies problems with satiety and satiation.  Denies problems with eating patterns and portion control.  Sleeping approximately 7 hours a day.  Sleep described as restorative.  Stress levels are reported as medium and due to Family.  Barriers identified none.    Pharmacotherapy: Metformin   PHYSICAL EXAM:  Blood pressure 106/75, pulse 96, temperature 98.1 F (36.7 C), height 5' 3"$  (1.6 m), weight 190 lb (86.2 kg), SpO2 98 %. Body mass index is 33.66 kg/m.  General: She is overweight, cooperative, alert, well developed, and in no acute distress. PSYCH: Has normal mood, affect and thought process.   HEENT: EOMI, sclerae are anicteric. Lungs: Normal breathing effort, no conversational dyspnea. Extremities: No edema.  Neurologic: No gross sensory or motor deficits. No tremors or fasciculations noted.    DIAGNOSTIC DATA  REVIEWED:  BMET    Component Value Date/Time   NA 136 09/25/2022 0700   NA 137 09/14/2022 1019   K 3.6 09/25/2022 0700   CL 102 09/25/2022 0700   CO2 27 09/25/2022 0700   GLUCOSE 86 09/25/2022 0700   BUN 16 09/25/2022 0700   BUN 10 09/14/2022 1019   CREATININE 0.83 09/25/2022 0700   CREATININE 0.96 10/15/2020 1333   CALCIUM 9.4 09/25/2022 0700   GFRNONAA >60 09/25/2022 0700   GFRNONAA 69 10/15/2020 1333   GFRAA 80 10/15/2020 1333   Lab Results  Component Value Date   HGBA1C 6.3 (H) 09/14/2022   HGBA1C 6.2 09/09/2021   Lab Results  Component Value Date   INSULIN 9.1 09/14/2022   Lab Results  Component Value Date   TSH 1.440 09/14/2022   CBC    Component Value Date/Time   WBC 6.1 09/25/2022 0700   RBC 4.59 09/25/2022 0700   HGB 13.1 09/25/2022 0700   HGB 14.0 09/14/2022 1019   HCT 40.3 09/25/2022 0700   HCT 43.9 09/14/2022 1019   PLT 229 09/25/2022 0700   PLT 192 09/14/2022 1019   MCV 87.8 09/25/2022 0700   MCV 89 09/14/2022 1019   MCH 28.5 09/25/2022 0700   MCHC 32.5 09/25/2022 0700   RDW 12.3 09/25/2022 0700   RDW 12.1 09/14/2022 1019   Iron Studies    Component Value Date/Time   IRON 48 04/22/2021 1403   IRON 136 02/11/2021 1005   TIBC 433 04/22/2021 1403   TIBC  353 02/11/2021 1005   FERRITIN 16 04/22/2021 1403   IRONPCTSAT 11 (L) 04/22/2021 1403   IRONPCTSAT 39 02/11/2021 1005   IRONPCTSAT 45 02/27/2019 1033   Lipid Panel     Component Value Date/Time   CHOL 293 (H) 09/14/2022 1019   TRIG 108 09/14/2022 1019   HDL 109 09/14/2022 1019   CHOLHDL 2.4 04/03/2020 1409   VLDL 13.2 02/27/2019 1033   LDLCALC 166 (H) 09/14/2022 1019   LDLCALC 105 (H) 04/03/2020 1409   Hepatic Function Panel     Component Value Date/Time   PROT 8.8 (H) 09/14/2022 1019   ALBUMIN 4.6 09/14/2022 1019   AST 39 09/14/2022 1019   ALT 27 09/14/2022 1019   ALKPHOS 65 09/14/2022 1019   BILITOT 0.3 09/14/2022 1019   BILIDIR 0.0 01/08/2019 1237      Component Value  Date/Time   TSH 1.440 09/14/2022 1019   Nutritional Lab Results  Component Value Date   VD25OH 22.3 (L) 09/14/2022   VD25OH 33.28 02/27/2019     ASSESSMENT AND PLAN  TREATMENT PLAN FOR OBESITY:  Recommended Dietary Goals  Nancy Villarreal is currently in the action stage of change. As such, her goal is to continue weight management plan. She has agreed to the Category 2 Plan.  Behavioral Intervention  We discussed the following Behavioral Modification Strategies today: increasing lean protein intake, increasing vegetables, increase water intake, work on meal planning and easy cooking plans, and think about ways to increase physical activity.  Additional resources provided today: NA  Recommended Physical Activity Goals  Nancy Villarreal has been advised to work up to 150 minutes of moderate intensity aerobic activity a week and strengthening exercises 2-3 times per week for cardiovascular health, weight loss maintenance and preservation of muscle mass.   She has agreed to increase physical activity in their day and reduce sedentary time (increase NEAT).  and continue physical activity as is.    Pharmacotherapy We discussed various medication options to help Nancy Villarreal with her weight loss efforts and we both agreed to continuing metformin.  ASSOCIATED CONDITIONS ADDRESSED TODAY  Obesity with current BMI of 34.6  Prediabetes Assessment & Plan: Most recent A1c is  Lab Results  Component Value Date   HGBA1C 6.3 (H) 09/14/2022   with associated elevated insulin levels.  Patient informed of disease state and risk of progression. This may contribute to abnormal cravings, fatigue and diabetes complications without having diabetes.   We reviewed treatment options which include weight loss of about 7 to 10% of body weight, increasing physical activity to 150 minutes a week of moderate intensity and pharmacotherapy with metformin.    Orders: -     metFORMIN HCl ER; Take 1 tablet (500 mg total) by  mouth daily with breakfast.  Dispense: 30 tablet; Refill: 0  Vitamin D deficiency Assessment & Plan: Most recent vitamin D levels  Lab Results  Component Value Date   VD25OH 22.3 (L) 09/14/2022   VD25OH 33.28 02/27/2019     Deficiency state associated with adiposity and may result in leptin resistance, weight gain and fatigue. Currently on vitamin D supplementation without any adverse effects.  Plan: Continue with over-the-counter supplementation.  Check levels in April for goal of 50-60.       Return in about 2 weeks (around 10/31/2022) for For Weight Mangement with Dr. Gerarda Fraction.Marland Kitchen She was informed of the importance of frequent follow up visits to maximize her success with intensive lifestyle modifications for her multiple health conditions.   ATTESTASTION STATEMENTS:  Reviewed by clinician on day of visit: allergies, medications, problem list, medical history, surgical history, family history, social history, and previous encounter notes.     Thomes Dinning, MD

## 2022-10-17 NOTE — Assessment & Plan Note (Signed)
Most recent A1c is  Lab Results  Component Value Date   HGBA1C 6.3 (H) 09/14/2022   with associated elevated insulin levels.  Patient informed of disease state and risk of progression. This may contribute to abnormal cravings, fatigue and diabetes complications without having diabetes.   We reviewed treatment options which include weight loss of about 7 to 10% of body weight, increasing physical activity to 150 minutes a week of moderate intensity and pharmacotherapy with metformin.

## 2022-10-17 NOTE — Assessment & Plan Note (Signed)
Most recent vitamin D levels  Lab Results  Component Value Date   VD25OH 22.3 (L) 09/14/2022   VD25OH 33.28 02/27/2019     Deficiency state associated with adiposity and may result in leptin resistance, weight gain and fatigue. Currently on vitamin D supplementation without any adverse effects.  Plan: Continue with over-the-counter supplementation.  Check levels in April for goal of 50-60.

## 2022-10-20 ENCOUNTER — Ambulatory Visit
Admission: RE | Admit: 2022-10-20 | Discharge: 2022-10-20 | Disposition: A | Discharge status: Home / Self Care | Payer: No Typology Code available for payment source | Source: Ambulatory Visit | Attending: Family | Admitting: Family

## 2022-10-20 DIAGNOSIS — Z1231 Encounter for screening mammogram for malignant neoplasm of breast: Secondary | ICD-10-CM

## 2022-10-26 NOTE — Assessment & Plan Note (Signed)
Acute illness with discolored sputum for 4 days Plan-Z-Pak.

## 2022-10-26 NOTE — Assessment & Plan Note (Signed)
We went over the acute bronchitis she is going to try reducing prednisone to 10 mg alternating with 20 mg every other day.  Anticipate very slow taper as tolerated.

## 2022-10-30 DIAGNOSIS — R7303 Prediabetes: Secondary | ICD-10-CM | POA: Diagnosis not present

## 2022-11-01 ENCOUNTER — Ambulatory Visit (INDEPENDENT_AMBULATORY_CARE_PROVIDER_SITE_OTHER): Payer: No Typology Code available for payment source | Admitting: Internal Medicine

## 2022-11-01 VITALS — BP 104/73 | HR 99 | Temp 98.4°F | Ht 63.0 in | Wt 191.0 lb

## 2022-11-01 DIAGNOSIS — G4700 Insomnia, unspecified: Secondary | ICD-10-CM

## 2022-11-01 DIAGNOSIS — R7303 Prediabetes: Secondary | ICD-10-CM | POA: Diagnosis not present

## 2022-11-01 DIAGNOSIS — Z6834 Body mass index (BMI) 34.0-34.9, adult: Secondary | ICD-10-CM

## 2022-11-01 DIAGNOSIS — E669 Obesity, unspecified: Secondary | ICD-10-CM | POA: Diagnosis not present

## 2022-11-01 MED ORDER — METFORMIN HCL ER 500 MG PO TB24: 500.0000 mg | ORAL_TABLET | Freq: Two times a day (BID) | ORAL | 0 refills | Status: DC

## 2022-11-01 NOTE — Assessment & Plan Note (Signed)
Most recent hemoglobin A1c is 6.3.  She is currently on metformin 500 mg a day without any adverse effects.  We will increase medication to twice a day.  She will continue with nutritional strategies and weight loss.

## 2022-11-01 NOTE — Progress Notes (Signed)
Office: [phone]  /  Fax: [phone]  WEIGHT SUMMARY AND BIOMETRICS  Vitals Temp: 98.4 F (36.9 C) BP: 104/73 Pulse Rate: 99 SpO2: 98 %   Anthropometric Measurements Height: '5\' 3"'$  (1.6 m) Weight: 191 lb (86.6 kg) BMI (Calculated): 33.84 Weight at Last Visit: 190 lb Weight Lost Since Last Visit: +1 Starting Weight: 193 lb Total Weight Loss (lbs): 3 lb (1.361 kg) Peak Weight: 201 lb   Body Composition  Body Fat %: 41.8 % Fat Mass (lbs): 79.8 lbs Muscle Mass (lbs): 105.6 lbs Total Body Water (lbs): 69.2 lbs Visceral Fat Rating : 11   Other Clinical Data Fasting: No Labs: No Today's Visit #: 4 Starting Date: 09/14/22    HPI  Chief Complaint: OBESITY  Nancy Villarreal is here to discuss her progress with her obesity treatment plan. She is on the the Category 2 Plan and states she is following her eating plan approximately 20 % of the time. She states she not exercising.  Interval History:  Since last office visit she has gained 1 lb. She reports suboptimal due to husband being in the hospital with sepsis.  Reports problems with appetite and hunger signals in the evening Denies problems with satiety and satiation.  Denies problems with eating patterns and portion control.  Sleeping approximately 5 hours a day.  Sleep described as interrupted.  Stress levels are reported as high and due to Family. Husband sick in hospital,  Barriers identified multiple competing priorities.   Pharmacotherapy for weight loss: Metformin with primary indication of diabetes prevention.  Weight promoting medications identified: Prednisone, Effexor  ASSESSMENT AND PLAN  TREATMENT PLAN FOR OBESITY:  Recommended Dietary Goals  Nancy Villarreal is currently in the action stage of change. As such, her goal is to continue weight management plan. She has agreed to the Category 2 Plan.  Behavioral Intervention  We discussed the following Behavioral Modification Strategies today: increasing  lean protein intake, increasing vegetables, increasing water intake, work on meal planning and easy cooking plans, work on managing stress, creating time for self-care and relaxation measures, and eating healthy while traveling. .  Additional resources provided today: NA  Recommended Physical Activity Goals  Nancy Villarreal has been advised to work up to 150 minutes of moderate intensity aerobic activity a week and strengthening exercises 2-3 times per week for cardiovascular health, weight loss maintenance and preservation of muscle mass.   She has agreed to : continue as is.  Pharmacotherapy We discussed various medication options to help Nancy Villarreal with her weight loss efforts and we both agreed to increasing metformin to 500 mg twice a day.  This will also help offset the weight gain associated with prednisone.  ASSOCIATED CONDITIONS ADDRESSED TODAY  Prediabetes Assessment & Plan: Most recent hemoglobin A1c is 6.3.  She is currently on metformin 500 mg a day without any adverse effects.  We will increase medication to twice a day.  She will continue with nutritional strategies and weight loss.  Orders: -     metFORMIN HCl ER; Take 1 tablet (500 mg total) by mouth 2 (two) times daily with a meal.  Dispense: 60 tablet; Refill: 0  Obesity with current BMI of 34.6  Insomnia, unspecified type Assessment & Plan: Patient counseled on sleep hygiene.  Prednisone contributing.  Was taking trazodone but found medication ineffective.  She was provided information.  She may also want to try magnesium glycinate in the evening to promote sleep.      PHYSICAL EXAM:  Blood pressure 104/73, pulse 99,  temperature 98.4 F (36.9 C), height '5\' 3"'$  (1.6 m), weight 191 lb (86.6 kg), SpO2 98 %. Body mass index is 33.83 kg/m.  General: She is overweight, cooperative, alert, well developed, and in no acute distress. PSYCH: Has normal mood, affect and thought process.   HEENT: EOMI, sclerae are  anicteric. Lungs: Normal breathing effort, no conversational dyspnea. Extremities: No edema.  Neurologic: No gross sensory or motor deficits. No tremors or fasciculations noted.    DIAGNOSTIC DATA REVIEWED:  BMET    Component Value Date/Time   NA 136 09/25/2022 0700   NA 137 09/14/2022 1019   K 3.6 09/25/2022 0700   CL 102 09/25/2022 0700   CO2 27 09/25/2022 0700   GLUCOSE 86 09/25/2022 0700   BUN 16 09/25/2022 0700   BUN 10 09/14/2022 1019   CREATININE 0.83 09/25/2022 0700   CREATININE 0.96 10/15/2020 1333   CALCIUM 9.4 09/25/2022 0700   GFRNONAA >60 09/25/2022 0700   GFRNONAA 69 10/15/2020 1333   GFRAA 80 10/15/2020 1333   Lab Results  Component Value Date   HGBA1C 6.3 (H) 09/14/2022   HGBA1C 6.2 09/09/2021   Lab Results  Component Value Date   INSULIN 9.1 09/14/2022   Lab Results  Component Value Date   TSH 1.440 09/14/2022   CBC    Component Value Date/Time   WBC 6.1 09/25/2022 0700   RBC 4.59 09/25/2022 0700   HGB 13.1 09/25/2022 0700   HGB 14.0 09/14/2022 1019   HCT 40.3 09/25/2022 0700   HCT 43.9 09/14/2022 1019   PLT 229 09/25/2022 0700   PLT 192 09/14/2022 1019   MCV 87.8 09/25/2022 0700   MCV 89 09/14/2022 1019   MCH 28.5 09/25/2022 0700   MCHC 32.5 09/25/2022 0700   RDW 12.3 09/25/2022 0700   RDW 12.1 09/14/2022 1019   Iron Studies    Component Value Date/Time   IRON 48 04/22/2021 1403   IRON 136 02/11/2021 1005   TIBC 433 04/22/2021 1403   TIBC 353 02/11/2021 1005   FERRITIN 16 04/22/2021 1403   IRONPCTSAT 11 (L) 04/22/2021 1403   IRONPCTSAT 39 02/11/2021 1005   IRONPCTSAT 45 02/27/2019 1033   Lipid Panel     Component Value Date/Time   CHOL 293 (H) 09/14/2022 1019   TRIG 108 09/14/2022 1019   HDL 109 09/14/2022 1019   CHOLHDL 2.4 04/03/2020 1409   VLDL 13.2 02/27/2019 1033   LDLCALC 166 (H) 09/14/2022 1019   LDLCALC 105 (H) 04/03/2020 1409   Hepatic Function Panel     Component Value Date/Time   PROT 8.8 (H) 09/14/2022  1019   ALBUMIN 4.6 09/14/2022 1019   AST 39 09/14/2022 1019   ALT 27 09/14/2022 1019   ALKPHOS 65 09/14/2022 1019   BILITOT 0.3 09/14/2022 1019   BILIDIR 0.0 01/08/2019 1237      Component Value Date/Time   TSH 1.440 09/14/2022 1019   Nutritional Lab Results  Component Value Date   VD25OH 22.3 (L) 09/14/2022   VD25OH 33.28 02/27/2019     Return in about 3 weeks (around 11/22/2022) for For Weight Mangement with Dr. Gerarda Fraction.Marland Kitchen She was informed of the importance of frequent follow up visits to maximize her success with intensive lifestyle modifications for her multiple health conditions.   ATTESTASTION STATEMENTS:  Reviewed by clinician on day of visit: allergies, medications, problem list, medical history, surgical history, family history, social history, and previous encounter notes.     Thomes Dinning, MD

## 2022-11-01 NOTE — Assessment & Plan Note (Signed)
Patient counseled on sleep hygiene.  Prednisone contributing.  Was taking trazodone but found medication ineffective.  She was provided information.  She may also want to try magnesium glycinate in the evening to promote sleep.

## 2022-11-21 ENCOUNTER — Encounter (INDEPENDENT_AMBULATORY_CARE_PROVIDER_SITE_OTHER): Payer: Self-pay | Admitting: Internal Medicine

## 2022-11-21 ENCOUNTER — Ambulatory Visit (INDEPENDENT_AMBULATORY_CARE_PROVIDER_SITE_OTHER): Payer: No Typology Code available for payment source | Admitting: Internal Medicine

## 2022-11-21 ENCOUNTER — Encounter: Payer: Self-pay | Admitting: Internal Medicine

## 2022-11-21 VITALS — BP 118/76 | HR 112 | Temp 98.4°F | Ht 63.0 in | Wt 191.0 lb

## 2022-11-21 DIAGNOSIS — R7303 Prediabetes: Secondary | ICD-10-CM

## 2022-11-21 DIAGNOSIS — R000 Tachycardia, unspecified: Secondary | ICD-10-CM | POA: Diagnosis not present

## 2022-11-21 DIAGNOSIS — E669 Obesity, unspecified: Secondary | ICD-10-CM | POA: Diagnosis not present

## 2022-11-21 DIAGNOSIS — Z6833 Body mass index (BMI) 33.0-33.9, adult: Secondary | ICD-10-CM

## 2022-11-21 DIAGNOSIS — E7800 Pure hypercholesterolemia, unspecified: Secondary | ICD-10-CM | POA: Diagnosis not present

## 2022-11-21 MED ORDER — METFORMIN HCL ER 500 MG PO TB24: 500.0000 mg | ORAL_TABLET | Freq: Two times a day (BID) | ORAL | 0 refills | Status: DC

## 2022-11-21 NOTE — Progress Notes (Signed)
Office: [phone]  /  Fax: [phone]  WEIGHT SUMMARY AND BIOMETRICS  Vitals Temp: 98.4 F (36.9 C) BP: 118/76 Pulse Rate: (!) 112 SpO2: 99 %   Anthropometric Measurements Height: 5\' 3"  (1.6 m) Weight: 191 lb (86.6 kg) BMI (Calculated): 33.84 Weight at Last Visit: 191 lb Weight Lost Since Last Visit: 0 lb Starting Weight: 193 lb Total Weight Loss (lbs): 3 lb (1.361 kg) Peak Weight: 201 lb   Body Composition  Body Fat %: 42.2 % Fat Mass (lbs): 80.8 lbs Muscle Mass (lbs): 105.2 lbs Total Body Water (lbs): 68.2 lbs Visceral Fat Rating : 11    HPI  Chief Complaint: OBESITY  Jalyn is here to discuss her progress with her obesity treatment plan. She is on the the Category 2 Plan and states she is following her eating plan approximately 0 % of the time. She states she is exercising 30 minutes 3 times per week.  Interval History:  Since last office visit she has been weight neutral.  She was at [facility] and enjoyed herself. She reports suboptimal adherence due to travel but was working on making healthy choices. She has been working on not skipping meals and making healthier choices [x] Denies [] Reports problems with appetite and hunger signals.  [x] Denies [] Reports problems with satiety and satiation.  [x] Denies [] Reports problems with eating patterns and portion control.  [x] Denies [] Reports abnormal cravings Barriers identified: Frequent exposure to prednisone  Pharmacotherapy for weight loss: She is currently taking Metformin (off label use for incretin effect and / or insulin resistance and / or diabetes prevention) .    ASSESSMENT AND PLAN  TREATMENT PLAN FOR OBESITY:  Recommended Dietary Goals  Lylian is currently in the action stage of change. As such, her goal is to continue weight management plan. She has agreed to switching to a journaling and tracking strategy.  She does not like the foods on the plan: keeping a food journal and adhering to  recommended goals of 1200 calories and 90 protein.  Breakout provided.  She was also given a list of lean sources of protein and complex carbs.  Behavioral Intervention  We discussed the following Behavioral Modification Strategies today: increasing lean protein intake, decreasing simple carbohydrates , increasing vegetables, increasing water intake, work on meal planning and easy cooking plans, work on tracking and journaling calories using tracking App, decreasing eating out, consumption of processed foods, and making healthy choices when eating convenient foods, and reading food labels .  Additional resources provided today: Handout on healthy eating and balanced plate and Handout on complex carbohydrates and lean sources of protein  Recommended Physical Activity Goals  Vaughn has been advised to work up to 150 minutes of moderate intensity aerobic activity a week and strengthening exercises 2-3 times per week for cardiovascular health, weight loss maintenance and preservation of muscle mass.   She has agreed to :  Think about ways to increase physical activity  Pharmacotherapy We discussed various medication options to help [place] with her weight loss efforts and we both agreed to : continue current anti-obesity medication regimen.  Patient would also benefit from incretin therapy.  She had been on Wegovy in the past but at a low dose.  She will look into coverage.  Tachycardia may be an issue.  ASSOCIATED CONDITIONS ADDRESSED TODAY  Tachycardia Assessment & Plan: She has been seen by cardiologist in the past.  She reports her tachycardia has been present even before starting venlafaxine.  It appears to be sinus based on  previous workup.  This may affect her ability to tolerate a GLP-1 drug in the future.  She is currently asymptomatic.  She has had normal TSH recently.  Continue to monitor   Prediabetes Assessment & Plan: Most recent hemoglobin A1c is 6.3.  She is currently on  metformin 500 mg twice a day.  She reports medication has helped with appetite and cravings.  She will continue current regimen.  We will check a hemoglobin A1c in 6 months.  Orders: -     metFORMIN HCl ER; Take 1 tablet (500 mg total) by mouth 2 (two) times daily with a meal.  Dispense: 60 tablet; Refill: 0  Obesity with current BMI of 33  Pure hypercholesterolemia Assessment & Plan: LDL is not at goal. Elevated LDL may be secondary to nutrition, genetics and spillover effect from excess adiposity. Recommended LDL goal is <70 to reduce the risk of fatty streaks and the progression to obstructive ASCVD in the future.  She has been started on atorvastatin and is tolerating medication well without any adverse effects.   Lab Results  Component Value Date   CHOL 293 (H) 09/14/2022   HDL 109 09/14/2022   LDLCALC 166 (H) 09/14/2022   TRIG 108 09/14/2022   CHOLHDL 2.4 04/03/2020    Continue weight loss therapy, losing 10% or more of body weight may improve condition. Also advised to reduce saturated fats in diet to less than 10% of daily calories.  Check fasting lipid panel in 3 months         PHYSICAL EXAM:  Blood pressure 118/76, pulse (!) 112, temperature 98.4 F (36.9 C), height 5\' 3"  (1.6 m), weight 191 lb (86.6 kg), SpO2 99 %. Body mass index is 33.83 kg/m.  General: She is overweight, cooperative, alert, well developed, and in no acute distress. PSYCH: Has normal mood, affect and thought process.   HEENT: EOMI, sclerae are anicteric. Lungs: Normal breathing effort, no conversational dyspnea. Extremities: No edema.  Neurologic: No gross sensory or motor deficits. No tremors or fasciculations noted.    DIAGNOSTIC DATA REVIEWED:  BMET    Component Value Date/Time   NA 136 09/25/2022 0700   NA 137 09/14/2022 1019   K 3.6 09/25/2022 0700   CL 102 09/25/2022 0700   CO2 27 09/25/2022 0700   GLUCOSE 86 09/25/2022 0700   BUN 16 09/25/2022 0700   BUN 10 09/14/2022 1019    CREATININE 0.83 09/25/2022 0700   CREATININE 0.96 10/15/2020 1333   CALCIUM 9.4 09/25/2022 0700   GFRNONAA >60 09/25/2022 0700   GFRNONAA 69 10/15/2020 1333   GFRAA 80 10/15/2020 1333   Lab Results  Component Value Date   HGBA1C 6.3 (H) 09/14/2022   HGBA1C 6.2 09/09/2021   Lab Results  Component Value Date   INSULIN 9.1 09/14/2022   Lab Results  Component Value Date   TSH 1.440 09/14/2022   CBC    Component Value Date/Time   WBC 6.1 09/25/2022 0700   RBC 4.59 09/25/2022 0700   HGB 13.1 09/25/2022 0700   HGB 14.0 09/14/2022 1019   HCT 40.3 09/25/2022 0700   HCT 43.9 09/14/2022 1019   PLT 229 09/25/2022 0700   PLT 192 09/14/2022 1019   MCV 87.8 09/25/2022 0700   MCV 89 09/14/2022 1019   MCH 28.5 09/25/2022 0700   MCHC 32.5 09/25/2022 0700   RDW 12.3 09/25/2022 0700   RDW 12.1 09/14/2022 1019   Iron Studies    Component Value Date/Time   IRON  48 04/22/2021 1403   IRON 136 02/11/2021 1005   TIBC 433 04/22/2021 1403   TIBC 353 02/11/2021 1005   FERRITIN 16 04/22/2021 1403   IRONPCTSAT 11 (L) 04/22/2021 1403   IRONPCTSAT 39 02/11/2021 1005   IRONPCTSAT 45 02/27/2019 1033   Lipid Panel     Component Value Date/Time   CHOL 293 (H) 09/14/2022 1019   TRIG 108 09/14/2022 1019   HDL 109 09/14/2022 1019   CHOLHDL 2.4 04/03/2020 1409   VLDL 13.2 02/27/2019 1033   LDLCALC 166 (H) 09/14/2022 1019   LDLCALC 105 (H) 04/03/2020 1409   Hepatic Function Panel     Component Value Date/Time   PROT 8.8 (H) 09/14/2022 1019   ALBUMIN 4.6 09/14/2022 1019   AST 39 09/14/2022 1019   ALT 27 09/14/2022 1019   ALKPHOS 65 09/14/2022 1019   BILITOT 0.3 09/14/2022 1019   BILIDIR 0.0 01/08/2019 1237      Component Value Date/Time   TSH 1.440 09/14/2022 1019   Nutritional Lab Results  Component Value Date   VD25OH 22.3 (L) 09/14/2022   VD25OH 33.28 02/27/2019     Return in about 3 weeks (around 12/12/2022) for For Weight Mangement with Dr. Gerarda Fraction.Marland Kitchen She was informed of  the importance of frequent follow up visits to maximize her success with intensive lifestyle modifications for her multiple health conditions.   ATTESTASTION STATEMENTS:  Reviewed by clinician on day of visit: allergies, medications, problem list, medical history, surgical history, family history, social history, and previous encounter notes.     Thomes Dinning, MD

## 2022-11-21 NOTE — Assessment & Plan Note (Signed)
LDL is not at goal. Elevated LDL may be secondary to nutrition, genetics and spillover effect from excess adiposity. Recommended LDL goal is <70 to reduce the risk of fatty streaks and the progression to obstructive ASCVD in the future.  She has been started on atorvastatin and is tolerating medication well without any adverse effects.   Lab Results  Component Value Date   CHOL 293 (H) 09/14/2022   HDL 109 09/14/2022   LDLCALC 166 (H) 09/14/2022   TRIG 108 09/14/2022   CHOLHDL 2.4 04/03/2020    Continue weight loss therapy, losing 10% or more of body weight may improve condition. Also advised to reduce saturated fats in diet to less than 10% of daily calories.  Check fasting lipid panel in 3 months

## 2022-11-21 NOTE — Assessment & Plan Note (Signed)
She has been seen by cardiologist in the past.  She reports her tachycardia has been present even before starting venlafaxine.  It appears to be sinus based on previous workup.  This may affect her ability to tolerate a GLP-1 drug in the future.  She is currently asymptomatic.  She has had normal TSH recently.  Continue to monitor

## 2022-11-21 NOTE — Assessment & Plan Note (Signed)
Most recent hemoglobin A1c is 6.3.  She is currently on metformin 500 mg twice a day.  She reports medication has helped with appetite and cravings.  She will continue current regimen.  We will check a hemoglobin A1c in 6 months.

## 2022-11-23 NOTE — Progress Notes (Unsigned)
Patient ID: Nancy Villarreal. Nancy Villarreal, female    DOB: 04-Apr-1971, 52 y.o.   MRN: [id number]  HPI F former smoker followed here with sarcoid Stage III,  Bronch NEG 09/24/08- dx based on mediastinal adenopathy/ interstitial disease,chronic elev ACE , response to steroid. Complicated by Iron def anemia, plasma cell disorder. Bronch NEG 09/24/08- dx based on mediastinal adenopathy/ interstitial disease, ACE 90, response to steroids. PFT 10/11/16- ACE 09/30/16- [place]  ACE 12/07/17- 97, up from 89 in 2018 ACE 01/08/19- 67 WNL ACE 08/04/20- 80H Immunoglobulins 12/19/16- IgG 2,669 ----------------------------------------------------------------------------------------------------  09/22/22- 52yoF former smoker(2.5 pk yrs) followed here with Sarcoid Stage III ( Bronch Pos 05/06/22- Nonnecrotizing granulomas,  mediastinal adenopathy/ interstitial disease, chronic elevation of ACE, response to steroid),,  Hemoptysis, Bronchitis, Complicated by Iron def anemia, plasma cell disorder -Albuterol hfa, prednisone 20 mg alt 10 mg qod, Covid vax- 6 Phizer Flu vax-had Has asked PCP for [facility] for Sarcoid- Lab 4/25- ACE 48 wnl Body weight today  -Pt states she has been coughing up yellow/green phlegm, nasal congestion, sore throat, aching. No fever and has had a negative covid test. Symptoms started Sunday Long-term disability approved. She has been alternating prednisone with 15 mg with 20 mg every other day.  [name] discussed steroid management and would like to try reducing to alternate 10 mg with 20 mg every other day.  Current regimen she has not been hemoptysizing. Now for past 4 days she has had an acute bronchitis syndrome describing green and yellow sputum without definite fever. HRCT chest 09/07/22- IMPRESSION: 1. Unchanged moderate pulmonary fibrosis, in an upper lobe and perihilar predominant pattern, featuring irregular peribronchovascular and perilymphatic interstitial opacity and confluent  nodularity as well as architectural distortion. Findings remain generally consistent with pulmonary sarcoidosis with some evidence of developing massive fibrosis. If characterized by ATS pulmonary fibrosis criteria, findings are suggestive of an alternative diagnosis (not UIP) per consensus guidelines: Diagnosis of Idiopathic Pulmonary Fibrosis: An Official ATS/ERS/JRS/ALAT Clinical Practice Guideline. [place], Iss 5, [phone], May 06 2017. 2. Numerous prominent, densely calcified mediastinal and hilar lymph nodes, in keeping with nodal sarcoidosis. 3. Diffuse bilateral bronchial wall thickening and lobular air trapping on expiratory phase imaging, consistent with small airways disease. 4. Enlargement of the main pulmonary artery, as can be seen in pulmonary hypertension. Aortic Atherosclerosis (ICD10-I70.0).                                // ONOX, ECHO next visit??//  11/24/22-52yoF former smoker(2.5 pk yrs) followed here with Sarcoid Stage III ( Bronch Pos 05/06/22- Nonnecrotizing granulomas,  mediastinal adenopathy/ interstitial disease, chronic elevation of ACE, response to steroid),,  Hemoptysis, Bronchitis, Complicated by Iron def anemia, plasma cell disorder, Obesity,  -Albuterol hfa, prednisone 15 mg alt 10 mg qod, Covid vax- 6 Phizer Flu vax-had Has asked PCP for [place] for Sarcoid- Lab 4/25- ACE 48 wnl Body weight today  -----Patient advises she has been coughing up a little a bit of blood. This started Saturday Last night was better. She notices diffuse joint aches on the days she takes 10 mg of prednisone.  We discussed flare of symptoms from underlying disease versus steroid withdrawal.  She saw a rheumatologist who also told her primary choice was between prednisone and methotrexate.  She understands side effect issues with both.  I offered second opinion referral, such as [facility], at any  time. She denies rash or  adenopathy. Has seen Dr. Barry/Cardiology following her tachycardia.  He is aware of sarcoid diagnosis. Disability application is proceeding through [employer]. We will update labs, CXR, PFT with this visit. CXR 09/25/22- FINDINGS: Reticular and nodular opacities especially in the upper lungs correlating with the history and prior CT. Normal heart size and mediastinal contours. No acute airspace disease, edema, effusion, or pneumothorax. IMPRESSION: Chronic lung disease without acute or focal finding.  Review of Systems-see HPI   + = positive HEENT:   No headaches,  Difficulty swallowing,  Tooth/dental problems,  Sore throat,                No sneezing, itching, ear ache, nasal congestion, post nasal drip,  CV:  No- chest pain,  No-PND, swelling in lower extremities, anasarca, dizziness, palpitations GI  No heartburn, indigestion, abdominal pain, nausea, vomiting,  Resp:  No acute shortness of breath with exertion .  No excess mucus, +productive cough,                  non-productive cough,  recent coughing up of blood. + change in color of mucus.  No wheezing.  Skin: no rash or lesions. GU: MS: joint pain .  No decreased range of motion.  No back pain. Psych:  No change in mood or affect. No depression or anxiety.  No memory loss.  Objective:   Physical Exam General- Alert, Oriented, Affect-appropriate, Distress- none acute.  Skin-   + mild acne Lymphadenopathy- none Head- atraumatic            Eyes- Gross vision intact, PERRLA, conjunctivae clear secretions            Ears- Hearing, canals normal            Nose- Clear, No-Septal dev, mucus, polyps, erosion, perforation             Throat- Mallampati II , mucosa-clear , drainage- none, tonsils present. , + hoarse from cough                    Neck- flexible , trachea midline, no stridor , thyroid nl, carotid no bruit Chest - symmetrical excursion , unlabored           Heart/CV- RRR , no murmur , no gallop  , no rub, nl s1  s2                           - JVD- none , edema- none, stasis changes- none, varices- none           Lung-   Cough-none, + somewhat coarse in bases, unlabored, wheeze- none,  dullness-none, rub- none           Chest wall-  Abd-  Br/ Gen/ Rectal- Not done, not indicated Extrem- cyanosis- none, clubbing, none, atrophy- none, strength- nl Neuro- grossly intact to observation

## 2022-11-24 ENCOUNTER — Ambulatory Visit: Payer: No Typology Code available for payment source | Admitting: Internal Medicine

## 2022-11-24 ENCOUNTER — Encounter: Payer: Self-pay | Admitting: Internal Medicine

## 2022-11-24 ENCOUNTER — Ambulatory Visit: Payer: No Typology Code available for payment source

## 2022-11-24 VITALS — BP 114/72 | HR 90 | Ht 63.0 in | Wt 195.8 lb

## 2022-11-24 DIAGNOSIS — D869 Sarcoidosis, unspecified: Secondary | ICD-10-CM

## 2022-11-24 DIAGNOSIS — R918 Other nonspecific abnormal finding of lung field: Secondary | ICD-10-CM | POA: Diagnosis not present

## 2022-11-24 DIAGNOSIS — R042 Hemoptysis: Secondary | ICD-10-CM | POA: Diagnosis not present

## 2022-11-24 NOTE — Assessment & Plan Note (Signed)
Occasional self-limited hemoptysis reflecting chronic airway inflammation from sarcoid.

## 2022-11-24 NOTE — Assessment & Plan Note (Signed)
Chronic active sarcoid.  We discussed desire to keep steroid therapy as long as we can get away with. Plan-continue prednisone 10/15 every other day.  Labs for ACE level, chemistry, chest x-ray, PFT

## 2022-11-24 NOTE — Patient Instructions (Signed)
We can continue alternating prednisone 15 mg with 10 mg every other day  Order- schedule PFT   dx Sarcoid  Order- CXR   dx sarcoid  Order- lab- CBC w diff, CMET, Angiotensin Converting Enzyme level    dx sarcoid

## 2022-11-25 ENCOUNTER — Other Ambulatory Visit (INDEPENDENT_AMBULATORY_CARE_PROVIDER_SITE_OTHER): Payer: No Typology Code available for payment source

## 2022-11-25 DIAGNOSIS — D869 Sarcoidosis, unspecified: Secondary | ICD-10-CM

## 2022-11-25 LAB — COMPREHENSIVE METABOLIC PANEL
ALT: 20 U/L (ref 0–35)
AST: 22 U/L (ref 0–37)
Albumin: 4.2 g/dL (ref 3.5–5.2)
Alkaline Phosphatase: 50 U/L (ref 39–117)
BUN: 17 mg/dL (ref 6–23)
CO2: 29 mEq/L (ref 19–32)
Calcium: 9.5 mg/dL (ref 8.4–10.5)
Chloride: 99 mEq/L (ref 96–112)
Creatinine, Ser: 0.78 mg/dL (ref 0.40–1.20)
GFR: 86.96 mL/min (ref >60.00)
Glucose, Bld: 83 mg/dL (ref 70–99)
Potassium: 3.9 mEq/L (ref 3.5–5.1)
Sodium: 136 mEq/L (ref 135–145)
Total Bilirubin: 0.2 mg/dL (ref 0.2–1.2)
Total Protein: 8.5 g/dL — ABNORMAL HIGH (ref 6.0–8.3)

## 2022-11-25 LAB — CBC WITH DIFFERENTIAL/PLATELET
Basophils Absolute: 0 10*3/uL (ref 0.0–0.1)
Basophils Relative: 0.5 % (ref 0.0–3.0)
Eosinophils Absolute: 0 10*3/uL (ref 0.0–0.7)
Eosinophils Relative: 0.6 % (ref 0.0–5.0)
HCT: 38.8 % (ref 36.0–46.0)
Hemoglobin: 12.7 g/dL (ref 12.0–15.0)
Lymphocytes Relative: 16.8 % (ref 12.0–46.0)
Lymphs Abs: 1.2 10*3/uL (ref 0.7–4.0)
MCHC: 32.6 g/dL (ref 30.0–36.0)
MCV: 86.3 fl (ref 78.0–100.0)
Monocytes Absolute: 0.4 10*3/uL (ref 0.1–1.0)
Monocytes Relative: 6 % (ref 3.0–12.0)
Neutro Abs: 5.3 10*3/uL (ref 1.4–7.7)
Neutrophils Relative %: 76.1 % (ref 43.0–77.0)
Platelets: 208 10*3/uL (ref 150.0–400.0)
RBC: 4.5 Mil/uL (ref 3.87–5.11)
RDW: 12.4 % (ref 11.5–15.5)
WBC: 7 10*3/uL (ref 4.0–10.5)

## 2022-11-28 DIAGNOSIS — R7303 Prediabetes: Secondary | ICD-10-CM | POA: Diagnosis not present

## 2022-11-28 LAB — ANGIOTENSIN CONVERTING ENZYME: Angiotensin-Converting Enzyme: 87 U/L — ABNORMAL HIGH (ref 9–67)

## 2022-11-29 ENCOUNTER — Telehealth: Payer: Self-pay | Admitting: Internal Medicine

## 2022-11-29 NOTE — Telephone Encounter (Signed)
Received request for renewal of long term disability - completed the form and gave to Dr. Annamaria Boots to sign.  Spoke to patient and let her know I will fax to insurance when it is signed and mail her a copy.

## 2022-11-30 ENCOUNTER — Other Ambulatory Visit: Payer: Self-pay

## 2022-11-30 MED ORDER — PREDNISONE 5 MG PO TABS: 15.0000 mg | ORAL_TABLET | Freq: Every day | ORAL | 3 refills | Status: DC

## 2022-11-30 NOTE — Telephone Encounter (Signed)
Long term disability form signed by Dr. Annamaria Boots - faxed to [place] fax# [phone].   Hard copy mailed to patient.

## 2022-12-01 NOTE — Telephone Encounter (Signed)
Received a follow-up fax from [place] asking for 11/24/22 office notes.  Faxed requested notes to fax# [phone]

## 2022-12-06 ENCOUNTER — Encounter: Payer: Self-pay | Admitting: Family

## 2022-12-06 ENCOUNTER — Ambulatory Visit: Payer: No Typology Code available for payment source | Admitting: Family

## 2022-12-06 VITALS — BP 118/74 | HR 99 | Resp 18 | Ht 63.0 in | Wt 194.6 lb

## 2022-12-06 DIAGNOSIS — R000 Tachycardia, unspecified: Secondary | ICD-10-CM | POA: Diagnosis not present

## 2022-12-06 DIAGNOSIS — F418 Other specified anxiety disorders: Secondary | ICD-10-CM

## 2022-12-06 MED ORDER — VENLAFAXINE HCL ER 75 MG PO CP24: 75.0000 mg | ORAL_CAPSULE | Freq: Every day | ORAL | 1 refills | Status: DC

## 2022-12-06 NOTE — Progress Notes (Signed)
Nancy Villarreal is a 52 y.o. female with the following history as recorded in EpicCare:  Patient Active Problem List   Diagnosis Date Noted   Insomnia 11/01/2022   Current chronic use of systemic steroids 09/28/2022   Former smoker 09/28/2022   Prediabetes 09/28/2022   Vitamin D deficiency 09/28/2022   Abnormal CT of the chest    Other secondary pulmonary hypertension 12/28/2021   Tachycardia 10/12/2021   Hyperlipidemia 10/12/2021   Acute bronchitis 11/16/2020   Rash and nonspecific skin eruption 09/18/2020   Upper respiratory infection, acute 06/10/2017   Bilateral hand pain 01/13/2017   Pain, joint, shoulder, left 01/13/2017   Pain in left ankle and joints of left foot 01/13/2017   High risk medication use 01/13/2017   Nausea 08/09/2016   Cough 03/09/2016   Class 1 obesity without serious comorbidity with body mass index (BMI) of 34.0 to 34.9 in adult 01/22/2016   Right foot pain 01/21/2016   Routine general medical examination at a health care facility 12/11/2013   Visit for screening mammogram 03/07/2012   Anemia, iron deficiency 02/10/2012   Hemoptysis 09/07/2010   Sarcoidosis ([place]) 09/17/2008   Asthma in remission 09/03/2008   Allergic rhinitis due to pollen 09/03/2008    Current Outpatient Medications  Medication Sig Dispense Refill   atorvastatin (LIPITOR) 20 MG tablet Take 1 tablet (20 mg total) by mouth daily. 90 tablet 3   Biotin 10000 MCG TABS Take 10,000 mcg by mouth daily.     Cholecalciferol (VITAMIN D3) 50 MCG (2000 UT) TABS Take 50 mcg by mouth daily at 12 noon.     iron polysaccharides (NIFEREX) 150 MG capsule TAKE 1 CAPSULE(150 MG) BY MOUTH DAILY 90 capsule 1   metFORMIN (GLUCOPHAGE-XR) 500 MG 24 hr tablet Take 1 tablet (500 mg total) by mouth 2 (two) times daily with a meal. 60 tablet 0   predniSONE (DELTASONE) 10 MG tablet Alternate 20 mg with 15 mg or as directed (Patient taking differently: 15 mg. Alternate 20 mg with 15 mg or as directed) 100 tablet 0    predniSONE (DELTASONE) 5 MG tablet Take 3 tablets (15 mg total) by mouth daily with breakfast. 90 tablet 3   Prenatal Vit-Fe Fumarate-FA (PRENATAL MULTIVITAMIN) TABS tablet Take 2 tablets by mouth daily at 12 noon.     venlafaxine XR (EFFEXOR-XR) 75 MG 24 hr capsule Take 1 capsule (75 mg total) by mouth daily with breakfast. 90 capsule 1   No current facility-administered medications for this visit.    Allergies: Hydrocodone  Past Medical History:  Diagnosis Date   Abnormal chest x-ray    Anemia    Aortic atherosclerosis    Hemoptysis    Pulmonary hypertension    Sarcoid    bronch negative 09-2008 ACE 93   Tachycardia    Unspecified asthma(493.90)     Past Surgical History:  Procedure Laterality Date   BIOPSY  05/06/2022   Procedure: BIOPSY;  Surgeon: Spero Geralds, MD;  Location: [facility] ENDOSCOPY;  Service: Pulmonary;;   BRONCHIAL WASHINGS  05/06/2022   Procedure: BRONCHIAL WASHINGS;  Surgeon: Spero Geralds, MD;  Location: [facility] ENDOSCOPY;  Service: Pulmonary;;   BRONCHOSCOPY  09-2008   Negative   CESAREAN SECTION     HEMOSTASIS CONTROL  05/06/2022   Procedure: HEMOSTASIS CONTROL;  Surgeon: Spero Geralds, MD;  Location: [facility] ENDOSCOPY;  Service: Pulmonary;;   TUBAL LIGATION     1995   VIDEO BRONCHOSCOPY N/A 05/06/2022   Procedure: VIDEO BRONCHOSCOPY WITHOUT  FLUORO;  Surgeon: Spero Geralds, MD;  Location: [facility] ENDOSCOPY;  Service: Pulmonary;  Laterality: N/A;    Family History  Problem Relation Age of Onset   Hyperlipidemia Mother    Hypertension Mother    Cancer Mother        lung    Liver disease Mother    Obesity Mother    Diabetes Father    Hyperlipidemia Maternal Grandmother    Stroke Maternal Grandmother    Lung cancer Maternal Grandfather    Cancer Paternal Grandmother    Alcohol abuse Paternal Grandmother    Early death Paternal Grandmother    Asthma Paternal Grandfather    Healthy Daughter    Healthy Son     Social History   Tobacco Use   Smoking status: Former     Packs/day: 0.25    Years: 10.00    Additional pack years: 0.00    Total pack years: 2.50    Types: Cigarettes    Start date: 2001    Quit date: 08/05/2008    Years since quitting: 14.3    Passive exposure: Past   Smokeless tobacco: Never  Substance Use Topics   Alcohol use: Yes    Subjective:   Patient is continuing to struggle with her sarcoidosis- pulmonologist recently increased dosage of prednisone again and hopeful this will help to gain better control of symptoms; she notes however that her weight loss provider was concerned that Effexor may be contributing to her chronic elevated heart rate/ recent headaches; cardiology has been evaluated and sarcoid felt to be affecting heart as well;   Objective:  Vitals:   12/06/22 1053  BP: 118/74  Pulse: 99  Resp: 18  SpO2: 100%  Weight: 194 lb 9.6 oz (88.3 kg)  Height: 5\' 3"  (1.6 m)    General: Well developed, well nourished, in no acute distress  Skin : Warm and dry.  Head: Normocephalic and atraumatic  Lungs: Respirations unlabored; clear to auscultation bilaterally without wheeze, rales, rhonchi  CVS exam: normal rate and regular rhythm.  Neurologic: Alert and oriented; speech intact; face symmetrical; moves all extremities well; CNII-XII intact without focal deficit   Assessment:  1. Situational anxiety   2. Tachycardia     Plan:  Can try lowering dosage of Effexor XR to 75 mg daily and see if side effects improve; also hopeful that finding right balance for prescription for prednisone will help as well; plan for virtual visit in 1 month;   Time spent 30 minutes  Return in about 1 month (around 01/05/2023) for virtual visit.  No orders of the defined types were placed in this encounter.   Requested Prescriptions   Signed Prescriptions Disp Refills   venlafaxine XR (EFFEXOR-XR) 75 MG 24 hr capsule 90 capsule 1    Sig: Take 1 capsule (75 mg total) by mouth daily with breakfast.

## 2022-12-15 ENCOUNTER — Ambulatory Visit (INDEPENDENT_AMBULATORY_CARE_PROVIDER_SITE_OTHER): Payer: No Typology Code available for payment source | Admitting: Internal Medicine

## 2022-12-26 ENCOUNTER — Encounter: Payer: Self-pay | Admitting: Family

## 2022-12-26 MED ORDER — POLYSACCHARIDE IRON COMPLEX 150 MG PO CAPS: ORAL_CAPSULE | ORAL | 1 refills | Status: DC

## 2022-12-27 DIAGNOSIS — E782 Mixed hyperlipidemia: Secondary | ICD-10-CM | POA: Diagnosis not present

## 2022-12-28 LAB — LIPID PANEL
Chol/HDL Ratio: 2 ratio (ref 0.0–4.4)
Cholesterol, Total: 232 mg/dL — ABNORMAL HIGH (ref 100–199)
HDL: 115 mg/dL (ref >39)
LDL Chol Calc (NIH): 103 mg/dL — ABNORMAL HIGH (ref 0–99)
Triglycerides: 84 mg/dL (ref 0–149)
VLDL Cholesterol Cal: 14 mg/dL (ref 5–40)

## 2022-12-28 LAB — HEPATIC FUNCTION PANEL
ALT: 13 IU/L (ref 0–32)
AST: 16 IU/L (ref 0–40)
Albumin: 4.1 g/dL (ref 3.8–4.9)
Alkaline Phosphatase: 55 IU/L (ref 44–121)
Bilirubin Total: 0.3 mg/dL (ref 0.0–1.2)
Bilirubin, Direct: <0.1 mg/dL (ref 0.00–0.40)
Total Protein: 7.5 g/dL (ref 6.0–8.5)

## 2022-12-29 DIAGNOSIS — R7303 Prediabetes: Secondary | ICD-10-CM | POA: Diagnosis not present

## 2022-12-30 ENCOUNTER — Ambulatory Visit (INDEPENDENT_AMBULATORY_CARE_PROVIDER_SITE_OTHER): Payer: No Typology Code available for payment source | Admitting: Internal Medicine

## 2022-12-30 DIAGNOSIS — D869 Sarcoidosis, unspecified: Secondary | ICD-10-CM

## 2022-12-30 LAB — PULMONARY FUNCTION TEST
DL/VA % pred: 112 %
DL/VA: 4.84 ml/min/mmHg/L
DLCO cor % pred: 83 %
DLCO cor: 16.88 ml/min/mmHg
DLCO unc % pred: 82 %
DLCO unc: 16.5 ml/min/mmHg
FEF 25-75 Post: 2.4 L/sec
FEF 25-75 Pre: 1.54 L/sec
FEF2575-%Change-Post: 56 %
FEF2575-%Pred-Post: 92 %
FEF2575-%Pred-Pre: 59 %
FEV1-%Change-Post: 12 %
FEV1-%Pred-Post: 75 %
FEV1-%Pred-Pre: 66 %
FEV1-Post: 1.99 L
FEV1-Pre: 1.77 L
FEV1FVC-%Change-Post: 10 %
FEV1FVC-%Pred-Pre: 96 %
FEV6-%Change-Post: 1 %
FEV6-%Pred-Post: 71 %
FEV6-%Pred-Pre: 69 %
FEV6-Post: 2.32 L
FEV6-Pre: 2.28 L
FEV6FVC-%Pred-Post: 102 %
FEV6FVC-%Pred-Pre: 102 %
FVC-%Change-Post: 1 %
FVC-%Pred-Post: 69 %
FVC-%Pred-Pre: 68 %
FVC-Post: 2.34 L
FVC-Pre: 2.29 L
Post FEV1/FVC ratio: 85 %
Post FEV6/FVC ratio: 100 %
Pre FEV1/FVC ratio: 77 %
Pre FEV6/FVC Ratio: 100 %
RV % pred: 95 %
RV: 1.72 L
TLC % pred: 85 %
TLC: 4.22 L

## 2022-12-30 NOTE — Patient Instructions (Signed)
Full PFT performed today. °

## 2022-12-30 NOTE — Progress Notes (Signed)
Full PFT performed today. °

## 2023-01-15 ENCOUNTER — Other Ambulatory Visit (INDEPENDENT_AMBULATORY_CARE_PROVIDER_SITE_OTHER): Payer: Self-pay | Admitting: Internal Medicine

## 2023-01-15 DIAGNOSIS — R7303 Prediabetes: Secondary | ICD-10-CM

## 2023-01-28 DIAGNOSIS — R7303 Prediabetes: Secondary | ICD-10-CM | POA: Diagnosis not present

## 2023-01-30 ENCOUNTER — Encounter (INDEPENDENT_AMBULATORY_CARE_PROVIDER_SITE_OTHER): Payer: Self-pay | Admitting: Internal Medicine

## 2023-02-03 ENCOUNTER — Other Ambulatory Visit: Payer: Self-pay | Admitting: Internal Medicine

## 2023-02-03 ENCOUNTER — Encounter: Payer: Self-pay | Admitting: Family

## 2023-02-03 ENCOUNTER — Encounter: Payer: Self-pay | Admitting: Internal Medicine

## 2023-02-06 MED ORDER — PREDNISONE 10 MG PO TABS: ORAL_TABLET | ORAL | 0 refills | Status: DC

## 2023-02-06 NOTE — Telephone Encounter (Signed)
Suggest increasing prednisone to 20 mg daily x 2-3 days to see if bleeding stops with this. Once it stops, try dropping back to alternating 10 mg and 15 mg every other day again. Be sure to avoid blood thinner meds like aspirin and ibuprofen. Oi to take tylenol if needed.

## 2023-02-06 NOTE — Telephone Encounter (Signed)
[name]- I called and spoke with the Nancy Villarreal  She is c/o fatigue and hemoptysis She had first had hemoptysis 3 days ago, and then had another episode today with larger amount of blood   Can we work her in today?  I refilled her pred bc she said she just ran out  Thanks!

## 2023-02-08 ENCOUNTER — Telehealth: Payer: Self-pay | Admitting: Internal Medicine

## 2023-02-08 MED ORDER — PREDNISONE 5 MG PO TABS: ORAL_TABLET | ORAL | 5 refills | Status: DC

## 2023-02-08 NOTE — Telephone Encounter (Signed)
Clarification prednisone order sent to [facility]

## 2023-02-10 ENCOUNTER — Encounter: Payer: Self-pay | Admitting: Internal Medicine

## 2023-02-16 ENCOUNTER — Other Ambulatory Visit (INDEPENDENT_AMBULATORY_CARE_PROVIDER_SITE_OTHER): Payer: Self-pay | Admitting: Internal Medicine

## 2023-02-16 DIAGNOSIS — R7303 Prediabetes: Secondary | ICD-10-CM

## 2023-02-28 DIAGNOSIS — R7303 Prediabetes: Secondary | ICD-10-CM | POA: Diagnosis not present

## 2023-03-01 ENCOUNTER — Encounter: Payer: Self-pay | Admitting: Internal Medicine

## 2023-03-06 NOTE — Telephone Encounter (Signed)
Reminder- ibuprofen and aspirin products may increase bleeding. Tylenol will not, so if it can help manage chest discomfort, ti would be a better choice. Happy to work her in to a held spot when I get back.

## 2023-03-06 NOTE — Telephone Encounter (Signed)
Called and spoke with the Nancy Villarreal  She reports had hemoptysis on 03/01/23 and 03/02/23  Has not had any since  She states has occ chest discomfort after cough- takes advil and this helps   She denies any increased SOB, fevers, aches  She is on pred 20 mg  I offered to send msg to on call doc and she refused  She only wants to see [name] and says she feels fine waiting until next wk for response from him  Can she be worked into a held slot?   I advised that if hemoptysis returns or she develops new or progressive symptoms to head to ED  She verbalized understanding

## 2023-03-07 NOTE — Telephone Encounter (Signed)
Spoke with the pt and notified of response per Dr. Maple Hudson  She verbalized understanding  I have scheduled her for Monday 03/13/23 at 11:30 am Advised seek emergent care sooner if needed

## 2023-03-11 ENCOUNTER — Other Ambulatory Visit (INDEPENDENT_AMBULATORY_CARE_PROVIDER_SITE_OTHER): Payer: Self-pay | Admitting: Internal Medicine

## 2023-03-11 ENCOUNTER — Other Ambulatory Visit: Payer: Self-pay | Admitting: Family

## 2023-03-11 DIAGNOSIS — R7303 Prediabetes: Secondary | ICD-10-CM

## 2023-03-12 NOTE — Progress Notes (Addendum)
Patient ID: Nancy Villarreal. [name], female    DOB: [birth year], 52 y.o.   MRN: [id number]  HPI F former smoker followed here with sarcoid Stage III,  Bronch NEG 09/24/08- dx based on mediastinal adenopathy/ interstitial disease,chronic elev ACE , response to steroid. Complicated by Iron def anemia, plasma cell disorder. Bronch 05/06/22 PFT 10/11/16- ACE 09/30/16- 86H  ACE 12/07/17- 97, up from 89 in 2018 ACE 01/08/19- 67 WNL ACE 08/04/20- 80H Immunoglobulins 12/19/16- IgG 2,669 ----------------------------------------------------------------------------------------------------   11/24/22-52yoF former smoker(2.5 pk yrs) followed here with Sarcoid Stage III ( Bronch Pos 05/06/22- Nonnecrotizing granulomas,  mediastinal adenopathy/ interstitial disease, chronic elevation of ACE, response to steroid),,  Hemoptysis, Bronchitis, Complicated by Iron def anemia, plasma cell disorder, Obesity,  -Albuterol hfa, prednisone 15 mg alt 10 mg qod, Covid vax- 6 Phizer Flu vax-had Has asked PCP for [employer] for Sarcoid- Lab 4/25- ACE 48 wnl Body weight today  -----Patient advises she has been coughing up a little a bit of blood. This started Saturday Last night was better. She notices diffuse joint aches on the days she takes 10 mg of prednisone.  We discussed flare of symptoms from underlying disease versus steroid withdrawal.  She saw a rheumatologist who also told her primary choice was between prednisone and methotrexate.  She understands side effect issues with both.  I offered second opinion referral, such as University, at any time. She denies rash or adenopathy. Has seen Dr. Barry/Cardiology following her tachycardia.  He is aware of sarcoid diagnosis. Disability application is proceeding through [employer]. We will update labs, CXR, PFT with this visit. CXR 09/25/22- FINDINGS: Reticular and nodular opacities especially in the upper lungs correlating with the history and prior CT. Normal  heart size and mediastinal contours. No acute airspace disease, edema, effusion, or pneumothorax. IMPRESSION: Chronic lung disease without acute or focal finding.  03/13/23- 52yoF former smoker(2.5 pk yrs) followed here with Sarcoid Stage III ( Bronch Pos 05/06/22- Nonnecrotizing granulomas,  mediastinal adenopathy/ interstitial disease, chronic elevation of ACE, response to steroid),  Hemoptysis, Bronchitis, Complicated by Iron def anemia, plasma cell disorder, Obesity,  -Albuterol hfa, prednisone 10 mg daily15 mg alt 20 mg qod, Has asked PCP for [employer] for Sarcoid- Lab 12/28/21- ACE 48 wnl,  Body weight today 198 lbs -----Coughing up blood, bright red pt states she occ gets this. States she doesn't have a cough  Current episode is sitting quickly now.  Spontaneous hemoptysis episodes come on every couple of months, no apparent trigger, and last a few days.  No chest pain or changes otherwise noted.  No relation to menstrual periods. She is currently taking prednisone 10 mg daily.  We discussed a strategy of trying to hold 10 mg but increasing to 15 or 20 mg as needed for bleeding flares.  We will refill her supply.  We discussed referral to a university and we discussed alternative of methotrexate.  Currently she is not interested in either. We discussed steroid withdrawal.  Which she gets down to 10 mg daily she begins to experience aching in her knees making stairs difficult.  She understands need to keep steroid doses low.  Once sarcoid finally stabilizes, we discussed withdrawal strategy and potential for adrenal insufficiency.Marland Kitchen PFT 12/30/22-moderate obst, slight response to BD, DLCO WNL, TLC 85% CXR 11/25/22- IMPRESSION: Chronic lung changes without evidence of acute cardiopulmonary Disease HRCT 09/02/22- IMPRESSION: 1. Moderate pulmonary fibrosis, with an upper lobe and perihilar predominance, characterized by fine nodular and confluent  consolidative opacity and  architectural distortion, with many nodules concentrated along the fissures and pleura (i.e. perilymphatic distribution). If characterized by ATS pulmonary fibrosis criteria, findings are in an alternative diagnosis (not UIP) pattern, and specifically highly suggestive of pulmonary sarcoidosis with some evidence of developing mass of fibrosis: Diagnosis of Idiopathic Pulmonary Fibrosis: An Official ATS/ERS/JRS/ALAT Clinical Practice Guideline. Am Rosezetta Schlatter Crit Care Med Vol 198, Iss 5, [phone], May 06 2017. 2. Numerous coarsely calcified mediastinal and bilateral hilar lymph nodes, consistent with nodal sarcoidosis. 3. Lobular air trapping on expiratory phase imaging, suggestive of small airways disease. 4. Enlargement of the main pulmonary artery, as can be seen in pulmonary hypertension. Aortic Atherosclerosis (ICD10-I70.0).  Review of Systems-see HPI   + = positive HEENT:   No headaches,  Difficulty swallowing,  Tooth/dental problems,  Sore throat,                No sneezing, itching, ear ache, nasal congestion, post nasal drip,  CV:  No- chest pain,  No-PND, swelling in lower extremities, anasarca, dizziness, palpitations GI  No heartburn, indigestion, abdominal pain, nausea, vomiting,  Resp:  No acute shortness of breath with exertion .  No excess mucus, +productive cough,                  non-productive cough,  +recent coughing up of blood. + change in color of mucus.  No wheezing.  Skin: no rash or lesions. GU: MS:+ joint pain .  No decreased range of motion.  No back pain. Psych:  No change in mood or affect. No depression or anxiety.  No memory loss.  Objective:   Physical Exam General- Alert, Oriented, Affect-appropriate, Distress- none acute.  Skin-   + mild acne Lymphadenopathy- none Head- atraumatic            Eyes- Gross vision intact, PERRLA, conjunctivae clear secretions            Ears- Hearing, canals normal            Nose- Clear, No-Septal dev, mucus, polyps,  erosion, perforation             Throat- Mallampati II , mucosa-clear , drainage- none, tonsils present. , + hoarse from cough                    Neck- flexible , trachea midline, no stridor , thyroid nl, carotid no bruit Chest - symmetrical excursion , unlabored           Heart/CV- RRR , no murmur , no gallop  , no rub, nl s1 s2                           - JVD- none , edema- none, stasis changes- none, varices- none           Lung-   Cough-none, + somewhat coarse in bases, unlabored, wheeze- none,  dullness-none, rub- none           Chest wall-  Abd-  Br/ Gen/ Rectal- Not done, not indicated Extrem- cyanosis- none, clubbing, none, atrophy- none, strength- nl Neuro- grossly intact to observation

## 2023-03-13 ENCOUNTER — Encounter: Payer: Self-pay | Admitting: Internal Medicine

## 2023-03-13 ENCOUNTER — Ambulatory Visit (INDEPENDENT_AMBULATORY_CARE_PROVIDER_SITE_OTHER): Payer: No Typology Code available for payment source

## 2023-03-13 ENCOUNTER — Ambulatory Visit: Payer: No Typology Code available for payment source | Admitting: Internal Medicine

## 2023-03-13 VITALS — BP 122/82 | HR 93 | Ht 63.0 in | Wt 198.4 lb

## 2023-03-13 DIAGNOSIS — R918 Other nonspecific abnormal finding of lung field: Secondary | ICD-10-CM | POA: Diagnosis not present

## 2023-03-13 DIAGNOSIS — Z7952 Long term (current) use of systemic steroids: Secondary | ICD-10-CM | POA: Diagnosis not present

## 2023-03-13 DIAGNOSIS — D869 Sarcoidosis, unspecified: Secondary | ICD-10-CM

## 2023-03-13 MED ORDER — PREDNISONE 5 MG PO TABS: ORAL_TABLET | ORAL | 1 refills | Status: DC

## 2023-03-13 NOTE — Assessment & Plan Note (Signed)
Intermittent hemoptysis relates to sarcoid scarring in the airways but does not necessarily indicate that sarcoid inflammation process continues. Plan-CXR, ACE level.  Plan to hold on 10 mg prednisone daily, increasing to 15 or 20 mg daily for short intervals as needed for control.

## 2023-03-13 NOTE — Patient Instructions (Addendum)
Script sent refilling prednisone 5 mg.  For now try taking 10 mg daily, increasing to 15 or 20 mg daily when needed for bleeding.  Order- CXR   dx sarcoid  Order- lab Angiotensin Converting Enzyme level   dx sarcoid  Ok to cancel July 22 Appt. We can see you in 6 months unless you need sooner.

## 2023-03-13 NOTE — Assessment & Plan Note (Signed)
We are going to try to hold at 10 mg of prednisone daily, increasing to 15 or 20 mg daily for short intervals if needed for hemoptysis.  We discussed potential need for endocrinology to help with adrenal insufficiency when the time comes.

## 2023-03-14 ENCOUNTER — Telehealth (HOSPITAL_BASED_OUTPATIENT_CLINIC_OR_DEPARTMENT_OTHER): Payer: Self-pay | Admitting: Internal Medicine

## 2023-03-14 ENCOUNTER — Encounter: Payer: Self-pay | Admitting: Internal Medicine

## 2023-03-14 NOTE — Telephone Encounter (Signed)
Patient states she is returning a phone call from [place]. Patient was not sure what it was in regards to when asked. Patient was just seen in office 03/13/23. Patient conmpleted CXR and lab work. Routing in case there was something else patient needed to complete. Please advise and call back if needed.

## 2023-03-14 NOTE — Telephone Encounter (Signed)
[name]- please advise on pt email:   ??? Ibuprofen was not taken until after I coughed up blood, for chest pain, after Tylenol did not help. I appreciate you responding, I have been in this battle for over 34yrs....... the ibuprofen didn't cause the bleeding

## 2023-03-14 NOTE — Telephone Encounter (Signed)
Called pt no answer, sent cxr results via mychart. NFN

## 2023-03-14 NOTE — Telephone Encounter (Signed)
Understand about Ibuprofen. Ok to use it if necessary. You have small areas of fragile scarring in your airways, with small, exposed thin walled capillary blood vessels that can break and bleed. Sometimes infection, like bronchitis, can irritate them. Other times physical activity, hard breathing or coughing might be enough.

## 2023-03-15 LAB — ANGIOTENSIN CONVERTING ENZYME: Angiotensin-Converting Enzyme: 66 U/L (ref 9–67)

## 2023-03-27 ENCOUNTER — Encounter: Payer: Self-pay | Admitting: Internal Medicine

## 2023-03-27 ENCOUNTER — Ambulatory Visit: Payer: No Typology Code available for payment source | Admitting: Internal Medicine

## 2023-03-30 DIAGNOSIS — R7303 Prediabetes: Secondary | ICD-10-CM | POA: Diagnosis not present

## 2023-04-09 ENCOUNTER — Other Ambulatory Visit (INDEPENDENT_AMBULATORY_CARE_PROVIDER_SITE_OTHER): Payer: Self-pay | Admitting: Internal Medicine

## 2023-04-09 DIAGNOSIS — R7303 Prediabetes: Secondary | ICD-10-CM

## 2023-04-25 ENCOUNTER — Encounter: Payer: Self-pay | Admitting: Internal Medicine

## 2023-04-27 ENCOUNTER — Telehealth: Payer: Self-pay | Admitting: Family

## 2023-04-27 NOTE — Telephone Encounter (Signed)
Belle from [facility] called & wanted to know if we received the document that was faxed 04/25/23. They are requesting records of the pt from 4/1-8/20. Please advise at [phone] ext [phone].

## 2023-04-27 NOTE — Telephone Encounter (Signed)
Documents requested have been faxed over

## 2023-04-27 NOTE — Telephone Encounter (Signed)
Belle from [employer] called for an update on medical records and Attending Physician's Statement. She was notified that it would take 7-10 business days for disability form to be completed. She was given contact information for Medical records as well.

## 2023-04-30 DIAGNOSIS — R7303 Prediabetes: Secondary | ICD-10-CM | POA: Diagnosis not present

## 2023-05-11 NOTE — Telephone Encounter (Signed)
New office notes were faxed via email to [email or web address]

## 2023-05-12 ENCOUNTER — Telehealth: Payer: Self-pay | Admitting: Internal Medicine

## 2023-05-12 DIAGNOSIS — Z0289 Encounter for other administrative examinations: Secondary | ICD-10-CM

## 2023-05-12 NOTE — Telephone Encounter (Signed)
Dr. Maple Hudson has completed the updated disability form and I have faxed it to [facility]  fax# [phone].   The $29 paperwork processing fee charged to patient in July 2023 was good for 12 months.  An additional $29 fee will be added to patient's chart to cover the next 12 months.

## 2023-05-29 ENCOUNTER — Encounter: Payer: Self-pay | Admitting: Internal Medicine

## 2023-05-29 NOTE — Telephone Encounter (Signed)
Let us know if bleeding isn't quickly controlled. Be sure you are avoiding aspirin.

## 2023-05-31 DIAGNOSIS — R7303 Prediabetes: Secondary | ICD-10-CM | POA: Diagnosis not present

## 2023-06-08 ENCOUNTER — Other Ambulatory Visit: Payer: Self-pay | Admitting: Family

## 2023-06-15 ENCOUNTER — Ambulatory Visit: Payer: No Typology Code available for payment source | Admitting: Family

## 2023-06-15 ENCOUNTER — Encounter: Payer: Self-pay | Admitting: Family

## 2023-06-15 VITALS — BP 112/74 | HR 93 | Resp 18 | Ht 63.0 in | Wt 199.8 lb

## 2023-06-15 DIAGNOSIS — M25551 Pain in right hip: Secondary | ICD-10-CM

## 2023-06-15 DIAGNOSIS — M549 Dorsalgia, unspecified: Secondary | ICD-10-CM | POA: Diagnosis not present

## 2023-06-15 DIAGNOSIS — R221 Localized swelling, mass and lump, neck: Secondary | ICD-10-CM

## 2023-06-15 DIAGNOSIS — R7303 Prediabetes: Secondary | ICD-10-CM

## 2023-06-15 NOTE — Progress Notes (Signed)
Nancy Villarreal is a 52 y.o. 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   Tachycardia 10/12/2021   Hyperlipidemia 10/12/2021   Acute bronchitis 11/16/2020   Rash and nonspecific skin eruption 09/18/2020   Upper respiratory infection, acute 06/10/2017   Bilateral hand pain 01/13/2017   Pain, joint, shoulder, left 01/13/2017   Pain in left ankle and joints of left foot 01/13/2017   High risk medication use 01/13/2017   Nausea 08/09/2016   Cough 03/09/2016   Class 1 obesity without serious comorbidity with body mass index (BMI) of 34.0 to 34.9 in adult 01/22/2016   Right foot pain 01/21/2016   Routine general medical examination at a health care facility 12/11/2013   Visit for screening mammogram 03/07/2012   Anemia, iron deficiency 02/10/2012   Hemoptysis 09/07/2010   Sarcoidosis (HCC) 09/17/2008   Asthma in remission 09/03/2008   Allergic rhinitis due to pollen 09/03/2008    Current Outpatient Medications  Medication Sig Dispense Refill   atorvastatin (LIPITOR) 20 MG tablet Take 1 tablet (20 mg total) by mouth daily. 90 tablet 3   Cholecalciferol (VITAMIN D3) 50 MCG (2000 UT) TABS Take 50 mcg by mouth daily at 12 noon.     iron polysaccharides (NIFEREX) 150 MG capsule TAKE 1 CAPSULE(150 MG) BY MOUTH DAILY 90 capsule 1   predniSONE (DELTASONE) 5 MG tablet 10 mg daily. Increase to 20 mg daily for hemoptysis. 200 tablet 1   venlafaxine XR (EFFEXOR-XR) 75 MG 24 hr capsule Take 1 capsule (75 mg total) by mouth daily with breakfast. 30 capsule 0   No current facility-administered medications for this visit.    Allergies: Hydrocodone  Past Medical History:  Diagnosis  Date   Abnormal chest x-ray    Anemia    Aortic atherosclerosis (HCC)    Hemoptysis    Pulmonary hypertension (HCC)    Sarcoid    bronch negative 09-2008 ACE 93   Tachycardia    Unspecified asthma(493.90)     Past Surgical History:  Procedure Laterality Date   BIOPSY  05/06/2022   Procedure: BIOPSY;  Surgeon: Charlott Holler, MD;  Location: [facility] ENDOSCOPY;  Service: Pulmonary;;   BRONCHIAL WASHINGS  05/06/2022   Procedure: BRONCHIAL WASHINGS;  Surgeon: Charlott Holler, MD;  Location: [facility] ENDOSCOPY;  Service: Pulmonary;;   BRONCHOSCOPY  09-2008   Negative   CESAREAN SECTION     HEMOSTASIS CONTROL  05/06/2022   Procedure: HEMOSTASIS CONTROL;  Surgeon: Charlott Holler, MD;  Location: [facility] ENDOSCOPY;  Service: Pulmonary;;   TUBAL LIGATION     1995   VIDEO BRONCHOSCOPY N/A 05/06/2022   Procedure: VIDEO BRONCHOSCOPY WITHOUT FLUORO;  Surgeon: Charlott Holler, MD;  Location: [facility] ENDOSCOPY;  Service: Pulmonary;  Laterality: N/A;    Family History  Problem Relation Age of Onset   Hyperlipidemia Mother    Hypertension Mother    Cancer Mother        lung    Liver disease Mother    Obesity Mother    Diabetes Father    Hyperlipidemia Maternal Grandmother    Stroke Maternal Grandmother    Lung cancer Maternal Grandfather    Cancer Paternal Grandmother  Alcohol abuse Paternal Grandmother    Early death Paternal Grandmother    Asthma Paternal Grandfather    Healthy Daughter    Healthy Son     Social History   Tobacco Use   Smoking status: Former    Current packs/day: 0.00    Average packs/day: 0.3 packs/day for 10.0 years (2.5 ttl pk-yrs)    Types: Cigarettes    Start date: 2001    Quit date: 08/05/2008    Years since quitting: 14.8    Passive exposure: Past   Smokeless tobacco: Never  Substance Use Topics   Alcohol use: Yes    Subjective:   Right hip pain x 1 month; no known injury; symptoms "only present when I lay on it"; does have some radiating symptoms;  Concerned about "lump"  in throat after eating- does not feel that food is actually getting stuck but notices sensation after eating;   Objective:  Vitals:   06/15/23 0816  BP: 112/74  Pulse: 93  Resp: 18  SpO2: 97%  Weight: 199 lb 12.8 oz (90.6 kg)  Height: 5\' 3"  (1.6 m)    General: Well developed, well nourished, in no acute distress  Skin : Warm and dry.  Head: Normocephalic and atraumatic  Eyes: Sclera and conjunctiva clear; pupils round and reactive to light; extraocular movements intact  Ears: External normal; canals clear; tympanic membranes normal  Oropharynx: Pink, supple. No suspicious lesions  Neck: Supple without thyromegaly, adenopathy  Lungs: Respirations unlabored; clear to auscultation bilaterally without wheeze, rales, rhonchi  CVS exam: normal rate and regular rhythm.  Musculoskeletal: No deformities; no active joint inflammation  Extremities: No edema, cyanosis, clubbing  Vessels: Symmetric bilaterally  Neurologic: Alert and oriented; speech intact; face symmetrical; moves all extremities well; CNII-XII intact without focal deficit   Assessment:  1. Right hip pain   2. Back pain, unspecified back location, unspecified back pain laterality, unspecified chronicity   3. Neck swelling   4. Pre-diabetes     Plan:  & 2. Update lumbar X-ray and right hip X-ray; will most likely need referral to orthopedics; 3.   Update neck ultrasound; to consider CT and/ or ENT follow up; 4.   Patient will return for fasting labs at later date;   No follow-ups on file.  Orders Placed This Encounter  Procedures   DG Lumbar Spine Complete    Standing Status:   Future    Standing Expiration Date:   06/14/2024    Order Specific Question:   Reason for Exam (SYMPTOM  OR DIAGNOSIS REQUIRED)    Answer:   low back pain    Order Specific Question:   Is patient pregnant?    Answer:   No    Order Specific Question:   Preferred imaging location?    Answer:   Internal   DG Hip Unilat W OR W/O Pelvis 1V Right     Standing Status:   Future    Standing Expiration Date:   06/14/2024    Order Specific Question:   Reason for Exam (SYMPTOM  OR DIAGNOSIS REQUIRED)    Answer:   right hip pain    Order Specific Question:   Is patient pregnant?    Answer:   No    Order Specific Question:   Preferred imaging location?    Answer:   Internal   US Soft Tissue Head/Neck (NON-THYROID)    Standing Status:   Future    Standing Expiration Date:   06/14/2024  Order Specific Question:   Reason for Exam (SYMPTOM  OR DIAGNOSIS REQUIRED)    Answer:   neck swelling    Order Specific Question:   Preferred imaging location?    Answer:   [facility]   CBC with Differential/Platelet    Standing Status:   Future    Standing Expiration Date:   06/14/2024   Comp Met (CMET)    Standing Status:   Future    Standing Expiration Date:   06/14/2024   Hemoglobin A1c    Standing Status:   Future    Standing Expiration Date:   06/14/2024    Requested Prescriptions    No prescriptions requested or ordered in this encounter

## 2023-06-19 ENCOUNTER — Encounter: Payer: Self-pay | Admitting: Family

## 2023-06-20 ENCOUNTER — Other Ambulatory Visit (INDEPENDENT_AMBULATORY_CARE_PROVIDER_SITE_OTHER): Payer: No Typology Code available for payment source

## 2023-06-20 ENCOUNTER — Encounter: Payer: Self-pay | Admitting: Family

## 2023-06-20 ENCOUNTER — Other Ambulatory Visit: Payer: Self-pay | Admitting: Family

## 2023-06-20 DIAGNOSIS — R7303 Prediabetes: Secondary | ICD-10-CM | POA: Diagnosis not present

## 2023-06-20 LAB — COMPREHENSIVE METABOLIC PANEL
ALT: 11 U/L (ref 0–35)
AST: 18 U/L (ref 0–37)
Albumin: 3.9 g/dL (ref 3.5–5.2)
Alkaline Phosphatase: 55 U/L (ref 39–117)
BUN: 11 mg/dL (ref 6–23)
CO2: 30 meq/L (ref 19–32)
Calcium: 9.5 mg/dL (ref 8.4–10.5)
Chloride: 100 meq/L (ref 96–112)
Creatinine, Ser: 0.74 mg/dL (ref 0.40–1.20)
GFR: 92.26 mL/min (ref >60.00)
Glucose, Bld: 84 mg/dL (ref 70–99)
Potassium: 4.3 meq/L (ref 3.5–5.1)
Sodium: 136 meq/L (ref 135–145)
Total Bilirubin: 0.3 mg/dL (ref 0.2–1.2)
Total Protein: 8.1 g/dL (ref 6.0–8.3)

## 2023-06-20 LAB — CBC WITH DIFFERENTIAL/PLATELET
Basophils Absolute: 0 10*3/uL (ref 0.0–0.1)
Basophils Relative: 0.6 % (ref 0.0–3.0)
Eosinophils Absolute: 0.1 10*3/uL (ref 0.0–0.7)
Eosinophils Relative: 2 % (ref 0.0–5.0)
HCT: 39.9 % (ref 36.0–46.0)
Hemoglobin: 12.6 g/dL (ref 12.0–15.0)
Lymphocytes Relative: 39.4 % (ref 12.0–46.0)
Lymphs Abs: 1.5 10*3/uL (ref 0.7–4.0)
MCHC: 31.5 g/dL (ref 30.0–36.0)
MCV: 86.7 fL (ref 78.0–100.0)
Monocytes Absolute: 0.5 10*3/uL (ref 0.1–1.0)
Monocytes Relative: 11.8 % (ref 3.0–12.0)
Neutro Abs: 1.8 10*3/uL (ref 1.4–7.7)
Neutrophils Relative %: 46.2 % (ref 43.0–77.0)
Platelets: 170 10*3/uL (ref 150.0–400.0)
RBC: 4.6 Mil/uL (ref 3.87–5.11)
RDW: 14.2 % (ref 11.5–15.5)
WBC: 3.9 10*3/uL — ABNORMAL LOW (ref 4.0–10.5)

## 2023-06-20 LAB — HEMOGLOBIN A1C: Hgb A1c MFr Bld: 6.2 % (ref 4.6–6.5)

## 2023-06-20 MED ORDER — METFORMIN HCL ER 500 MG PO TB24: 500.0000 mg | ORAL_TABLET | Freq: Every day | ORAL | 1 refills | Status: DC

## 2023-06-21 ENCOUNTER — Ambulatory Visit (HOSPITAL_BASED_OUTPATIENT_CLINIC_OR_DEPARTMENT_OTHER)
Admission: RE | Admit: 2023-06-21 | Discharge: 2023-06-21 | Disposition: A | Discharge status: Home / Self Care | Payer: No Typology Code available for payment source | Source: Ambulatory Visit | Attending: Family | Admitting: Family

## 2023-06-21 ENCOUNTER — Other Ambulatory Visit: Payer: Self-pay | Admitting: Family

## 2023-06-21 DIAGNOSIS — M549 Dorsalgia, unspecified: Secondary | ICD-10-CM | POA: Insufficient documentation

## 2023-06-21 DIAGNOSIS — M1611 Unilateral primary osteoarthritis, right hip: Secondary | ICD-10-CM | POA: Diagnosis not present

## 2023-06-21 DIAGNOSIS — M25551 Pain in right hip: Secondary | ICD-10-CM

## 2023-06-21 DIAGNOSIS — R221 Localized swelling, mass and lump, neck: Secondary | ICD-10-CM

## 2023-06-21 DIAGNOSIS — R7303 Prediabetes: Secondary | ICD-10-CM

## 2023-06-21 DIAGNOSIS — I878 Other specified disorders of veins: Secondary | ICD-10-CM | POA: Diagnosis not present

## 2023-06-26 ENCOUNTER — Encounter: Payer: Self-pay | Admitting: Family

## 2023-06-26 ENCOUNTER — Other Ambulatory Visit: Payer: Self-pay | Admitting: Family

## 2023-06-26 DIAGNOSIS — M25551 Pain in right hip: Secondary | ICD-10-CM

## 2023-06-26 DIAGNOSIS — M545 Low back pain, unspecified: Secondary | ICD-10-CM

## 2023-06-30 DIAGNOSIS — R7303 Prediabetes: Secondary | ICD-10-CM | POA: Diagnosis not present

## 2023-07-04 NOTE — Therapy (Signed)
OUTPATIENT PHYSICAL THERAPY THORACOLUMBAR AND HIP EVALUATION   Patient Name: Nancy Villarreal MRN: [id number] DOB:[birth year], 52 y.o., female Today's Date: 07/05/2023  END OF SESSION:  PT End of Session - 07/05/23 0849     Visit Number 1    Date for PT Re-Evaluation 08/30/23    Authorization Type [employer] preferred    PT Start Time 0850    PT Stop Time 0935    PT Time Calculation (min) 45 min    Activity Tolerance Patient tolerated treatment well    Behavior During Therapy [facility] for tasks assessed/performed             Past Medical History:  Diagnosis Date   Abnormal chest x-ray    Anemia    Aortic atherosclerosis (HCC)    Hemoptysis    Pulmonary hypertension (HCC)    Sarcoid    bronch negative 09-2008 ACE 93   Tachycardia    Unspecified asthma(493.90)    Past Surgical History:  Procedure Laterality Date   BIOPSY  05/06/2022   Procedure: BIOPSY;  Surgeon: Charlott Holler, MD;  Location: [facility] ENDOSCOPY;  Service: Pulmonary;;   BRONCHIAL WASHINGS  05/06/2022   Procedure: BRONCHIAL WASHINGS;  Surgeon: Charlott Holler, MD;  Location: [facility] ENDOSCOPY;  Service: Pulmonary;;   BRONCHOSCOPY  09-2008   Negative   CESAREAN SECTION     HEMOSTASIS CONTROL  05/06/2022   Procedure: HEMOSTASIS CONTROL;  Surgeon: Charlott Holler, MD;  Location: [facility] ENDOSCOPY;  Service: Pulmonary;;   TUBAL LIGATION     1995   VIDEO BRONCHOSCOPY N/A 05/06/2022   Procedure: VIDEO BRONCHOSCOPY WITHOUT FLUORO;  Surgeon: Charlott Holler, MD;  Location[facility] ENDOSCOPY;  Service: Pulmonary;  Laterality: N/A;   Patient Active Problem List   Diagnosis Date Noted   Insomnia 11/01/2022   Current chronic use of systemic steroids 09/28/2022   Former smoker 09/28/2022   Prediabetes 09/28/2022   Vitamin D deficiency 09/28/2022   Abnormal CT of the chest    Other secondary pulmonary hypertension (HCC) 12/28/2021   Tachycardia 10/12/2021   Hyperlipidemia 10/12/2021   Acute bronchitis 11/16/2020   Rash and  nonspecific skin eruption 09/18/2020   Upper respiratory infection, acute 06/10/2017   Bilateral hand pain 01/13/2017   Pain, joint, shoulder, left 01/13/2017   Pain in left ankle and joints of left foot 01/13/2017   High risk medication use 01/13/2017   Nausea 08/09/2016   Cough 03/09/2016   Class 1 obesity without serious comorbidity with body mass index (BMI) of 34.0 to 34.9 in adult 01/22/2016   Right foot pain 01/21/2016   Routine general medical examination at a health care facility 12/11/2013   Visit for screening mammogram 03/07/2012   Anemia, iron deficiency 02/10/2012   Hemoptysis 09/07/2010   Sarcoidosis (HCC) 09/17/2008   Asthma in remission 09/03/2008   Allergic rhinitis due to pollen 09/03/2008    PCP: Olive Bass, FNP   REFERRING PROVIDER: Olive Bass,*   REFERRING DIAG:  W09.811 (ICD-10-CM) - Right hip pain  M54.50 (ICD-10-CM) - Low back pain, unspecified back pain laterality, unspecified chronicity, unspecified whether sciatica present    Rationale for Evaluation and Treatment: Rehabilitation  THERAPY DIAG:  Pain in right hip  Radiculopathy, lumbar region  Cramp and spasm  ONSET DATE: June 2024  SUBJECTIVE:  SUBJECTIVE STATEMENT: We were moving and I probably over did it. I have pain if I sit or walk too much. Lying down on R side is limited to 10 min. Due to pain radiates down to my foot.  PERTINENT HISTORY:  Sarcoidosis, R sciatica  PAIN:  Are you having pain? Yes: NPRS scale: 0/10 today 8/10 Pain location: Glute min region and shoots down leg Pain description: sharp pain Aggravating factors: prolonged sitting, standing or lying on it Relieving factors: Tylenol, changing position  PRECAUTIONS: None  RED FLAGS: None   WEIGHT BEARING  RESTRICTIONS: No  FALLS:  Has patient fallen in last 6 months? No  LIVING ENVIRONMENT: Lives with: lives with their spouse Lives in: House/apartment Stairs: Yes: Internal: 16 steps; on right going up and External: 5 steps; can reach both Has following equipment at home: None  OCCUPATION: on disability from sarcoidosis  PLOF: Independent  PATIENT GOALS: get this pain gone  NEXT MD VISIT: none scheduled  OBJECTIVE:  Note: Objective measures were completed at Evaluation unless otherwise noted.  DIAGNOSTIC FINDINGS:  XR R hip: Mild degenerative change of the right hip.  XR lumbar: negative  PATIENT SURVEYS:   17 / 50 = 34.0 %   COGNITION: Overall cognitive status: Within functional limits for tasks assessed     SENSATION: WFL  MUSCLE LENGTH: Tight HS B (right is nerve pull also), piriformis R>L  POSTURE: increased lumbar lordosis  PALPATION: Marked TTP and increased tissue tension and trigger points in B gluteus med R>L, R glute min Pain with R unilateral L4/5 and L5/S1 mobs  LUMBAR ROM: Flex  pain bil post thigh, WNL    LOWER EXTREMITY ROM:   WNL  LOWER EXTREMITY MMT:    MMT Right eval Left eval  Hip flexion 4 5  Hip extension 4+ 5  Hip abduction 4+* 4+  Hip adduction 5 5  Hip internal rotation    Hip external rotation    Knee flexion    Knee extension 5 5  Ankle dorsiflexion 5 5  Ankle plantarflexion    Ankle inversion    Ankle eversion     (Blank rows = not tested)  LUMBAR SPECIAL TESTS:  Straight leg raise test: Positive, Slump test: Negative, and FABER test: Negative Negative prone knee bend  FUNCTIONAL TESTS:  5 times sit to stand: 15.8 sec  GAIT: Distance walked: 20 Assistive device utilized: None Level of assistance: Complete Independence Comments: decreased stance time R  TODAY'S TREATMENT:                                                                                                                              DATE:   07/05/23  See pt ed and HEP   PATIENT EDUCATION:  Education details: PT eval findings, anticipated POC, initial HEP, and role of DN  Person educated: Patient Education method: Explanation, Demonstration, and Handouts Education comprehension: verbalized understanding and returned demonstration  HOME EXERCISE PROGRAM:  Access Code: [email or web address] URL: [email or web address] Date: 07/05/2023 Prepared by: Raynelle Fanning  Exercises - Prone Press Up  - 2 x daily - 7 x weekly - 1-3 sets - 10 reps - Standing Lumbar Extension  - 2 x daily - 7 x weekly - 1-3 sets - 10 reps - Standing Lumbar Extension at Wall - Forearms  - 2 x daily - 7 x weekly - 1-3 sets - 10 reps - Supine 90/90 Sciatic Nerve Glide with Knee Flexion/Extension  - 1 x daily - 3-4 x weekly - 1-2 sets - 10 reps - 5 sec hold - Seated Piriformis Stretch with Trunk Bend (Mirrored)  - 2 x daily - 7 x weekly - 1 sets - 3 reps - 30-60 sec  hold  ASSESSMENT:  CLINICAL IMPRESSION: Patient is a 52 y.o. female who was seen today for physical therapy evaluation and treatment for R hip and low back pain. Her pain limits sitting, walking and sleeping. She has a h/o of R sciatica, pain with R PA mobs L4/5 and L5/S1 and positive SLR on the R. She also has pain with resisted hip ABD and mild weakness in the R hip in addition to trigger points in gluteus min and med. She will benefit from skilled PT to address these deficits.    OBJECTIVE IMPAIRMENTS: Abnormal gait, decreased endurance, decreased strength, hypomobility, increased muscle spasms, impaired flexibility, postural dysfunction, and pain.   ACTIVITY LIMITATIONS: bending, sitting, standing, sleeping, and locomotion level  PARTICIPATION LIMITATIONS: cleaning, laundry, driving, shopping, and community activity  PERSONAL FACTORS: Fitness, Time since onset of injury/illness/exacerbation, and 1 comorbidity: Sarcoidosis  are also affecting patient's functional outcome.   REHAB POTENTIAL:  Excellent  CLINICAL DECISION MAKING: Stable/uncomplicated  EVALUATION COMPLEXITY: Low   GOALS: Goals reviewed with patient? Yes  SHORT TERM GOALS: Target date: 08/02/2023   Patient will be independent with initial HEP.  Baseline:  Goal status: INITIAL  2.  Patient will report centralization of radicular symptoms.  Baseline:  Goal status: INITIAL   LONG TERM GOALS: Target date: 08/30/2023   Patient will be independent with advanced/ongoing HEP to improve outcomes and carryover.  Baseline:  Goal status: INITIAL  2.  Patient will report 85% improvement in low back and R pain with sitting and walking to improve QOL.  Baseline:  Goal status: INITIAL  3.  Patient will be able to sleep without waking from pain. Baseline:  Goal status: INITIAL  4.  Patient will demonstrate improved strength to 5/5 to normalize allow for correct body mechanics and gait. Baseline:  Goal status: INITIAL  5.  Patient will report 11/50 on MOI to demonstrate improved functional ability.  Baseline:  17 / 50 = 34.0 % Goal status: INITIAL   6.  Patient to demonstrate ability to achieve and maintain good spinal alignment/posturing and body mechanics needed for daily activities. Baseline:  Goal status: INITIAL   PLAN:  PT FREQUENCY: 2x/week  PT DURATION: 8 weeks  PLANNED INTERVENTIONS: 97110-Therapeutic exercises, 97530- Therapeutic activity, [id number]- Neuromuscular re-education, 97535- Self Care, [id number]- Manual therapy, [id number]- Gait training, 97014- Electrical stimulation (unattended), 97035- Ultrasound, [id number]- Ionotophoresis 4mg /ml Dexamethasone, Patient/Family education, Taping, Dry Needling, Joint mobilization, Spinal mobilization, Cryotherapy, and Moist heat.  PLAN FOR NEXT SESSION: DN/MT to R gluteals (and left too if needed), Review and progress HEP, core and hip strengthening as tolerated, body mechanics and ADL modifications    Solon Palm, PT  07/05/2023, 10:02 AM

## 2023-07-05 ENCOUNTER — Encounter: Payer: Self-pay | Admitting: Physical Therapy

## 2023-07-05 ENCOUNTER — Ambulatory Visit: Payer: No Typology Code available for payment source | Attending: Family | Admitting: Physical Therapy

## 2023-07-05 ENCOUNTER — Other Ambulatory Visit: Payer: Self-pay

## 2023-07-05 DIAGNOSIS — M25551 Pain in right hip: Secondary | ICD-10-CM

## 2023-07-05 DIAGNOSIS — M545 Low back pain, unspecified: Secondary | ICD-10-CM | POA: Insufficient documentation

## 2023-07-05 DIAGNOSIS — M5416 Radiculopathy, lumbar region: Secondary | ICD-10-CM

## 2023-07-05 DIAGNOSIS — R252 Cramp and spasm: Secondary | ICD-10-CM

## 2023-07-09 ENCOUNTER — Other Ambulatory Visit: Payer: Self-pay | Admitting: Family

## 2023-07-18 ENCOUNTER — Ambulatory Visit: Payer: No Typology Code available for payment source | Attending: Family | Admitting: Physical Therapy

## 2023-07-18 ENCOUNTER — Encounter: Payer: Self-pay | Admitting: Physical Therapy

## 2023-07-18 DIAGNOSIS — R252 Cramp and spasm: Secondary | ICD-10-CM | POA: Insufficient documentation

## 2023-07-18 DIAGNOSIS — M25551 Pain in right hip: Secondary | ICD-10-CM | POA: Diagnosis not present

## 2023-07-18 DIAGNOSIS — M5416 Radiculopathy, lumbar region: Secondary | ICD-10-CM | POA: Insufficient documentation

## 2023-07-18 NOTE — Therapy (Signed)
OUTPATIENT PHYSICAL THERAPY TREATMENT   Patient Name: Nancy Villarreal MRN: [id number] DOB:Mar 17, 1971, 52 y.o., female Today's Date: 07/18/2023  END OF SESSION:  PT End of Session - 07/18/23 0843     Visit Number 2    Date for PT Re-Evaluation 08/30/23    Authorization Type AETNA/[place] preferred    PT Start Time 0845    PT Stop Time 0933    PT Time Calculation (min) 48 min    Activity Tolerance Patient tolerated treatment well    Behavior During Therapy [facility] for tasks assessed/performed             Past Medical History:  Diagnosis Date   Abnormal chest x-ray    Anemia    Aortic atherosclerosis (HCC)    Hemoptysis    Pulmonary hypertension (HCC)    Sarcoid    bronch negative 09-2008 ACE 93   Tachycardia    Unspecified asthma(493.90)    Past Surgical History:  Procedure Laterality Date   BIOPSY  05/06/2022   Procedure: BIOPSY;  Surgeon: Charlott Holler, MD;  Location: [facility] ENDOSCOPY;  Service: Pulmonary;;   BRONCHIAL WASHINGS  05/06/2022   Procedure: BRONCHIAL WASHINGS;  Surgeon: Charlott Holler, MD;  Location: [facility] ENDOSCOPY;  Service: Pulmonary;;   BRONCHOSCOPY  09-2008   Negative   CESAREAN SECTION     HEMOSTASIS CONTROL  05/06/2022   Procedure: HEMOSTASIS CONTROL;  Surgeon: Charlott Holler, MD;  Location: [facility] ENDOSCOPY;  Service: Pulmonary;;   TUBAL LIGATION     1995   VIDEO BRONCHOSCOPY N/A 05/06/2022   Procedure: VIDEO BRONCHOSCOPY WITHOUT FLUORO;  Surgeon: Charlott Holler, MD;  Location: [facility] ENDOSCOPY;  Service: Pulmonary;  Laterality: N/A;   Patient Active Problem List   Diagnosis Date Noted   Insomnia 11/01/2022   Current chronic use of systemic steroids 09/28/2022   Former smoker 09/28/2022   Prediabetes 09/28/2022   Vitamin D deficiency 09/28/2022   Abnormal CT of the chest    Other secondary pulmonary hypertension (HCC) 12/28/2021   Tachycardia 10/12/2021   Hyperlipidemia 10/12/2021   Acute bronchitis 11/16/2020   Rash and nonspecific skin eruption  09/18/2020   Upper respiratory infection, acute 06/10/2017   Bilateral hand pain 01/13/2017   Pain, joint, shoulder, left 01/13/2017   Pain in left ankle and joints of left foot 01/13/2017   High risk medication use 01/13/2017   Nausea 08/09/2016   Cough 03/09/2016   Class 1 obesity without serious comorbidity with body mass index (BMI) of 34.0 to 34.9 in adult 01/22/2016   Right foot pain 01/21/2016   Routine general medical examination at a health care facility 12/11/2013   Visit for screening mammogram 03/07/2012   Anemia, iron deficiency 02/10/2012   Hemoptysis 09/07/2010   Sarcoidosis (HCC) 09/17/2008   Asthma in remission 09/03/2008   Allergic rhinitis due to pollen 09/03/2008    PCP: Olive Bass, FNP   REFERRING PROVIDER: Olive Bass,*   REFERRING DIAG:  W09.811 (ICD-10-CM) - Right hip pain  M54.50 (ICD-10-CM) - Low back pain, unspecified back pain laterality, unspecified chronicity, unspecified whether sciatica present    Rationale for Evaluation and Treatment: Rehabilitation  THERAPY DIAG:  Pain in right hip  Radiculopathy, lumbar region  Cramp and spasm  ONSET DATE: June 2024  SUBJECTIVE:  SUBJECTIVE STATEMENT: Aching a little bit yesterday.  Pain is not going down her leg as much.  Exercises going well.  Mostly bothersome at night, 6/10 or if sitting too long.   PERTINENT HISTORY:  Sarcoidosis, R sciatica  PAIN:  Are you having pain? Yes: NPRS scale: 1/10 Pain location: Glute min region and shoots down leg Pain description: sharp pain Aggravating factors: prolonged sitting, standing or lying on it Relieving factors: Tylenol, changing position  PRECAUTIONS: None  RED FLAGS: None   WEIGHT BEARING RESTRICTIONS: No  FALLS:  Has patient fallen  in last 6 months? No  LIVING ENVIRONMENT: Lives with: lives with their spouse Lives in: House/apartment Stairs: Yes: Internal: 16 steps; on right going up and External: 5 steps; can reach both Has following equipment at home: None  OCCUPATION: on disability from sarcoidosis  PLOF: Independent  PATIENT GOALS: get this pain gone  NEXT MD VISIT: none scheduled  OBJECTIVE:  Note: Objective measures were completed at Evaluation unless otherwise noted.  DIAGNOSTIC FINDINGS:  XR R hip: Mild degenerative change of the right hip.  XR lumbar: negative  PATIENT SURVEYS:   17 / 50 = 34.0 %   COGNITION: Overall cognitive status: Within functional limits for tasks assessed     SENSATION: WFL  MUSCLE LENGTH: Tight HS B (right is nerve pull also), piriformis R>L  POSTURE: increased lumbar lordosis  PALPATION: Marked TTP and increased tissue tension and trigger points in B gluteus med R>L, R glute min Pain with R unilateral L4/5 and L5/S1 mobs  LUMBAR ROM: Flex  pain bil post thigh, WNL    LOWER EXTREMITY ROM:   WNL  LOWER EXTREMITY MMT:    MMT Right eval Left eval  Hip flexion 4 5  Hip extension 4+ 5  Hip abduction 4+* 4+  Hip adduction 5 5  Hip internal rotation    Hip external rotation    Knee flexion    Knee extension 5 5  Ankle dorsiflexion 5 5  Ankle plantarflexion    Ankle inversion    Ankle eversion     (Blank rows = not tested)  LUMBAR SPECIAL TESTS:  Straight leg raise test: Positive, Slump test: Negative, and FABER test: Negative Negative prone knee bend  FUNCTIONAL TESTS:  5 times sit to stand: 15.8 sec  GAIT: Distance walked: 20 Assistive device utilized: None Level of assistance: Complete Independence Comments: decreased stance time R  TODAY'S TREATMENT:                                                                                                                              DATE:    07/18/2023 Therapeutic Exercise: to improve strength  and mobility.  Demo, verbal and tactile cues throughout for technique.  Nustep L5 x 6 min  Standing side glide - preferred L side, x 10  Standing lumbar extension x 10 Supine PPT x 10  Bridges with TrA contraction x 10 Prone leg  extension 2 x 5 R/L Manual Therapy: to decrease muscle spasm and pain and improve mobility STM/TPR to R glutes, lumbar paraspinals, R UPA mobs grade 2-3, skilled palpation and monitoring during dry needling. Trigger Point Dry-Needling  Treatment instructions: Expect mild to moderate muscle soreness. S/S of pneumothorax if dry needled over a lung field, and to seek immediate medical attention should they occur. Patient verbalized understanding of these instructions and education. Patient Consent Given: Yes Education handout provided: Yes Muscles treated: R glut med, R lumbar multifidi L5 Electrical stimulation performed: No Parameters: N/A Treatment response/outcome: Twitch Response Elicited and Palpable Increase in Muscle Length  07/05/23 See pt ed and HEP   PATIENT EDUCATION:  Education details: PT eval findings, anticipated POC, initial HEP, and role of DN  Person educated: Patient Education method: Explanation, Demonstration, and Handouts Education comprehension: verbalized understanding and returned demonstration  HOME EXERCISE PROGRAM: Access Code: [email or web address] URL: [email or web address] Date: 07/18/2023 Prepared by: Harrie Foreman  Exercises - Prone Press Up  - 2 x daily - 7 x weekly - 1-3 sets - 10 reps - Standing Lumbar Extension  - 2 x daily - 7 x weekly - 1-3 sets - 10 reps - Standing Lumbar Extension at Wall - Forearms  - 2 x daily - 7 x weekly - 1-3 sets - 10 reps - Supine 90/90 Sciatic Nerve Glide with Knee Flexion/Extension  - 1 x daily - 3-4 x weekly - 1-2 sets - 10 reps - 5 sec hold - Seated Piriformis Stretch with Trunk Bend (Mirrored)  - 2 x daily - 7 x weekly - 1 sets - 3 reps - 30-60 sec  hold - Lateral Shift Correction  at Wall  - 1 x daily - 7 x weekly - 3 sets - 10 reps - Supine Posterior Pelvic Tilt  - 1 x daily - 7 x weekly - 2 sets - 10 reps - Supine Bridge  - 1 x daily - 7 x weekly - 2 sets - 10 reps - Beginner Prone Single Leg Raise  - 1 x daily - 7 x weekly - 2 sets - 10 reps  ASSESSMENT:  CLINICAL IMPRESSION: Nancy Villarreal reports improvement in pain and good compliance with initial HEP, meeting STG #1.  She reports decreased low back pain and radicular symptoms.  Reviewed and progressed exercises today, still focusing on McKenzie extension, followed by manual therapy and TrDN.  After explanation of DN rational, procedures, outcomes and potential side effects, patient verbalized consent to DN treatment in conjunction with manual STM/DTM and TPR to reduce ttp/muscle tension. Muscles treated as indicated above. DN produced normal response with good twitches elicited resulting in palpable reduction in pain/ttp and muscle tension, with patient noting less pain upon initiation of movement following DN. Pt educated to expect mild to moderate muscle soreness for up to 24-48 hrs and instructed to continue prescribed home exercise program and current activity level with pt verbalizing understanding of theses instructions.   Nancy Villarreal continues to demonstrate potential for improvement and would benefit from continued skilled therapy to address impairments.     OBJECTIVE IMPAIRMENTS: Abnormal gait, decreased endurance, decreased strength, hypomobility, increased muscle spasms, impaired flexibility, postural dysfunction, and pain.   ACTIVITY LIMITATIONS: bending, sitting, standing, sleeping, and locomotion level  PARTICIPATION LIMITATIONS: cleaning, laundry, driving, shopping, and community activity  PERSONAL FACTORS: Fitness, Time since onset of injury/illness/exacerbation, and 1 comorbidity: Sarcoidosis  are also affecting patient's functional outcome.   REHAB POTENTIAL: Excellent  CLINICAL DECISION  MAKING: Stable/uncomplicated  EVALUATION COMPLEXITY: Low   GOALS: Goals reviewed with patient? Yes  SHORT TERM GOALS: Target date: 08/02/2023   Patient will be independent with initial HEP.  Baseline:  Goal status: MET 07/18/23  2.  Patient will report centralization of radicular symptoms.  Baseline:  Goal status: IN PROGRESS  07/18/23- improving   LONG TERM GOALS: Target date: 08/30/2023   Patient will be independent with advanced/ongoing HEP to improve outcomes and carryover.  Baseline:  Goal status: IN PROGRESS  2.  Patient will report 85% improvement in low back and R pain with sitting and walking to improve QOL.  Baseline:  Goal status: IN PROGRESS  3.  Patient will be able to sleep without waking from pain. Baseline:  Goal status: IN PROGRESS  4.  Patient will demonstrate improved strength to 5/5 to normalize allow for correct body mechanics and gait. Baseline:  Goal status: IN PROGRESS  5.  Patient will report 11/50 on MOI to demonstrate improved functional ability.  Baseline:  17 / 50 = 34.0 % Goal status: IN PROGRESS   6.  Patient to demonstrate ability to achieve and maintain good spinal alignment/posturing and body mechanics needed for daily activities. Baseline:  Goal status: IN PROGRESS   PLAN:  PT FREQUENCY: 2x/week  PT DURATION: 8 weeks  PLANNED INTERVENTIONS: 97110-Therapeutic exercises, 97530- Therapeutic activity, [id number]- Neuromuscular re-education, 97535- Self Care, 19147- Manual therapy, [id number]- Gait training, 97014- Electrical stimulation (unattended), 97035- Ultrasound, 82956- Ionotophoresis 4mg /ml Dexamethasone, Patient/Family education, Taping, Dry Needling, Joint mobilization, Spinal mobilization, Cryotherapy, and Moist heat.  PLAN FOR NEXT SESSION: assess response to TrDN.  Review and progress HEP, core and hip strengthening as tolerated, body mechanics and ADL modifications    Jena Gauss, PT  07/18/2023, 6:03 PM

## 2023-07-18 NOTE — Patient Instructions (Signed)

## 2023-07-21 ENCOUNTER — Encounter: Payer: Self-pay | Admitting: Physical Therapy

## 2023-07-21 ENCOUNTER — Ambulatory Visit: Payer: No Typology Code available for payment source | Admitting: Physical Therapy

## 2023-07-21 DIAGNOSIS — R252 Cramp and spasm: Secondary | ICD-10-CM

## 2023-07-21 DIAGNOSIS — M25551 Pain in right hip: Secondary | ICD-10-CM

## 2023-07-21 DIAGNOSIS — M5416 Radiculopathy, lumbar region: Secondary | ICD-10-CM

## 2023-07-21 NOTE — Therapy (Signed)
OUTPATIENT PHYSICAL THERAPY TREATMENT   Patient Name: Nancy Villarreal MRN: [id number] DOB:June 07, 1971, 52 y.o., female Today's Date: 07/21/2023  END OF SESSION:  PT End of Session - 07/21/23 0847     Visit Number 3    Date for PT Re-Evaluation 08/30/23    Authorization Type AETNA/[place] preferred    PT Start Time 0847    PT Stop Time 0933    PT Time Calculation (min) 46 min    Activity Tolerance Patient tolerated treatment well    Behavior During Therapy [facility] for tasks assessed/performed             Past Medical History:  Diagnosis Date   Abnormal chest x-ray    Anemia    Aortic atherosclerosis (HCC)    Hemoptysis    Pulmonary hypertension (HCC)    Sarcoid    bronch negative 09-2008 ACE 93   Tachycardia    Unspecified asthma(493.90)    Past Surgical History:  Procedure Laterality Date   BIOPSY  05/06/2022   Procedure: BIOPSY;  Surgeon: Charlott Holler, MD;  Location: [facility] ENDOSCOPY;  Service: Pulmonary;;   BRONCHIAL WASHINGS  05/06/2022   Procedure: BRONCHIAL WASHINGS;  Surgeon: Charlott Holler, MD;  Location: [facility] ENDOSCOPY;  Service: Pulmonary;;   BRONCHOSCOPY  09-2008   Negative   CESAREAN SECTION     HEMOSTASIS CONTROL  05/06/2022   Procedure: HEMOSTASIS CONTROL;  Surgeon: Charlott Holler, MD;  Location: [facility] ENDOSCOPY;  Service: Pulmonary;;   TUBAL LIGATION     1995   VIDEO BRONCHOSCOPY N/A 05/06/2022   Procedure: VIDEO BRONCHOSCOPY WITHOUT FLUORO;  Surgeon: Charlott Holler, MD;  Location: [facility] ENDOSCOPY;  Service: Pulmonary;  Laterality: N/A;   Patient Active Problem List   Diagnosis Date Noted   Insomnia 11/01/2022   Current chronic use of systemic steroids 09/28/2022   Former smoker 09/28/2022   Prediabetes 09/28/2022   Vitamin D deficiency 09/28/2022   Abnormal CT of the chest    Other secondary pulmonary hypertension (HCC) 12/28/2021   Tachycardia 10/12/2021   Hyperlipidemia 10/12/2021   Acute bronchitis 11/16/2020   Rash and nonspecific skin eruption  09/18/2020   Upper respiratory infection, acute 06/10/2017   Bilateral hand pain 01/13/2017   Pain, joint, shoulder, left 01/13/2017   Pain in left ankle and joints of left foot 01/13/2017   High risk medication use 01/13/2017   Nausea 08/09/2016   Cough 03/09/2016   Class 1 obesity without serious comorbidity with body mass index (BMI) of 34.0 to 34.9 in adult 01/22/2016   Right foot pain 01/21/2016   Routine general medical examination at a health care facility 12/11/2013   Visit for screening mammogram 03/07/2012   Anemia, iron deficiency 02/10/2012   Hemoptysis 09/07/2010   Sarcoidosis (HCC) 09/17/2008   Asthma in remission 09/03/2008   Allergic rhinitis due to pollen 09/03/2008    PCP: Olive Bass, FNP   REFERRING PROVIDER: Olive Bass,*   REFERRING DIAG:  W09.811 (ICD-10-CM) - Right hip pain  M54.50 (ICD-10-CM) - Low back pain, unspecified back pain laterality, unspecified chronicity, unspecified whether sciatica present    Rationale for Evaluation and Treatment: Rehabilitation  THERAPY DIAG:  Pain in right hip  Radiculopathy, lumbar region  Cramp and spasm  ONSET DATE: June 2024  SUBJECTIVE:  SUBJECTIVE STATEMENT: Had a rough night due to sarcoidosis, coughing up blood. So back is hurting.  Just a little soreness in the back.  Dry needling helped.   PERTINENT HISTORY:  Sarcoidosis, R sciatica  PAIN:  Are you having pain? Yes: NPRS scale: 1/10 Pain location: Glute min region and shoots down leg Pain description: sharp pain Aggravating factors: prolonged sitting, standing or lying on it Relieving factors: Tylenol, changing position  PRECAUTIONS: None  RED FLAGS: None   WEIGHT BEARING RESTRICTIONS: No  FALLS:  Has patient fallen in last 6 months?  No  LIVING ENVIRONMENT: Lives with: lives with their spouse Lives in: House/apartment Stairs: Yes: Internal: 16 steps; on right going up and External: 5 steps; can reach both Has following equipment at home: None  OCCUPATION: on disability from sarcoidosis  PLOF: Independent  PATIENT GOALS: get this pain gone  NEXT MD VISIT: none scheduled  OBJECTIVE:  Note: Objective measures were completed at Evaluation unless otherwise noted.  DIAGNOSTIC FINDINGS:  XR R hip: Mild degenerative change of the right hip.  XR lumbar: negative  PATIENT SURVEYS:   17 / 50 = 34.0 %   COGNITION: Overall cognitive status: Within functional limits for tasks assessed     SENSATION: WFL  MUSCLE LENGTH: Tight HS B (right is nerve pull also), piriformis R>L  POSTURE: increased lumbar lordosis  PALPATION: Marked TTP and increased tissue tension and trigger points in B gluteus med R>L, R glute min Pain with R unilateral L4/5 and L5/S1 mobs  LUMBAR ROM: Flex  pain bil post thigh, WNL    LOWER EXTREMITY ROM:   WNL  LOWER EXTREMITY MMT:    MMT Right eval Left eval  Hip flexion 4 5  Hip extension 4+ 5  Hip abduction 4+* 4+  Hip adduction 5 5  Hip internal rotation    Hip external rotation    Knee flexion    Knee extension 5 5  Ankle dorsiflexion 5 5  Ankle plantarflexion    Ankle inversion    Ankle eversion     (Blank rows = not tested)  LUMBAR SPECIAL TESTS:  Straight leg raise test: Positive, Slump test: Negative, and FABER test: Negative Negative prone knee bend  FUNCTIONAL TESTS:  5 times sit to stand: 15.8 sec  GAIT: Distance walked: 20 Assistive device utilized: None Level of assistance: Complete Independence Comments: decreased stance time R  TODAY'S TREATMENT:                                                                                                                              DATE:   07/21/2023 Therapeutic Exercise: to improve strength and mobility.  Demo,  verbal and tactile cues throughout for technique. Bike L2 x 6 min  Standing extension x 10  Squats x 10  -heels raised Wall squats 5 x 10 sec hold  Prone press-ups x 15 Prone hip extensions x 10 r/L Manual Therapy: to decrease muscle  spasm and pain and improve mobility STM/TPR to R glutes, lumbar paraspinals, R UPA mobs grade 2-3  07/18/2023 Therapeutic Exercise: to improve strength and mobility.  Demo, verbal and tactile cues throughout for technique.  Nustep L5 x 6 min  Standing side glide - preferred L side, x 10  Standing lumbar extension x 10 Supine PPT x 10  Bridges with TrA contraction x 10 Prone leg extension 2 x 5 R/L Manual Therapy: to decrease muscle spasm and pain and improve mobility STM/TPR to R glutes, lumbar paraspinals, R UPA mobs grade 2-3, skilled palpation and monitoring during dry needling. Trigger Point Dry-Needling  Treatment instructions: Expect mild to moderate muscle soreness. S/S of pneumothorax if dry needled over a lung field, and to seek immediate medical attention should they occur. Patient verbalized understanding of these instructions and education. Patient Consent Given: Yes Education handout provided: Yes Muscles treated: R glut med, R lumbar multifidi L5 Electrical stimulation performed: No Parameters: N/A Treatment response/outcome: Twitch Response Elicited and Palpable Increase in Muscle Length  07/05/23 See pt ed and HEP   PATIENT EDUCATION:  Education details: HEP review  Person educated: Patient Education method: Explanation, Demonstration, and Handouts Education comprehension: verbalized understanding and returned demonstration  HOME EXERCISE PROGRAM: Access Code: [email or web address] URL: [email or web address] Date: 07/18/2023 Prepared by: Harrie Foreman  Exercises - Prone Press Up  - 2 x daily - 7 x weekly - 1-3 sets - 10 reps - Standing Lumbar Extension  - 2 x daily - 7 x weekly - 1-3 sets - 10 reps - Standing Lumbar  Extension at Wall - Forearms  - 2 x daily - 7 x weekly - 1-3 sets - 10 reps - Supine 90/90 Sciatic Nerve Glide with Knee Flexion/Extension  - 1 x daily - 3-4 x weekly - 1-2 sets - 10 reps - 5 sec hold - Seated Piriformis Stretch with Trunk Bend (Mirrored)  - 2 x daily - 7 x weekly - 1 sets - 3 reps - 30-60 sec  hold - Lateral Shift Correction at Wall  - 1 x daily - 7 x weekly - 3 sets - 10 reps - Supine Posterior Pelvic Tilt  - 1 x daily - 7 x weekly - 2 sets - 10 reps - Supine Bridge  - 1 x daily - 7 x weekly - 2 sets - 10 reps - Beginner Prone Single Leg Raise  - 1 x daily - 7 x weekly - 2 sets - 10 reps  ASSESSMENT:  CLINICAL IMPRESSION: Nancy Villarreal reports improvement in pain and centralization of symptoms.  Reviewed exercises with good tolerance, also performed squats, of which she is not a fan.  With manual therapy noted tender spot R L2/3, not ready for TrDN today will consider next session.  Nancy Villarreal continues to demonstrate potential for improvement and would benefit from continued skilled therapy to address impairments.     OBJECTIVE IMPAIRMENTS: Abnormal gait, decreased endurance, decreased strength, hypomobility, increased muscle spasms, impaired flexibility, postural dysfunction, and pain.   ACTIVITY LIMITATIONS: bending, sitting, standing, sleeping, and locomotion level  PARTICIPATION LIMITATIONS: cleaning, laundry, driving, shopping, and community activity  PERSONAL FACTORS: Fitness, Time since onset of injury/illness/exacerbation, and 1 comorbidity: Sarcoidosis  are also affecting patient's functional outcome.   REHAB POTENTIAL: Excellent  CLINICAL DECISION MAKING: Stable/uncomplicated  EVALUATION COMPLEXITY: Low   GOALS: Goals reviewed with patient? Yes  SHORT TERM GOALS: Target date: 08/02/2023   Patient will be independent with initial HEP.  Baseline:  Goal  status: MET 07/18/23  2.  Patient will report centralization of radicular symptoms.   Baseline:  Goal status: MET  07/21/23- met   LONG TERM GOALS: Target date: 08/30/2023   Patient will be independent with advanced/ongoing HEP to improve outcomes and carryover.  Baseline:  Goal status: IN PROGRESS  2.  Patient will report 85% improvement in low back and R pain with sitting and walking to improve QOL.  Baseline:  Goal status: IN PROGRESS  3.  Patient will be able to sleep without waking from pain. Baseline:  Goal status: IN PROGRESS  4.  Patient will demonstrate improved strength to 5/5 to normalize allow for correct body mechanics and gait. Baseline:  Goal status: IN PROGRESS  5.  Patient will report 11/50 on MOI to demonstrate improved functional ability.  Baseline:  17 / 50 = 34.0 % Goal status: IN PROGRESS   6.  Patient to demonstrate ability to achieve and maintain good spinal alignment/posturing and body mechanics needed for daily activities. Baseline:  Goal status: IN PROGRESS   PLAN:  PT FREQUENCY: 2x/week  PT DURATION: 8 weeks  PLANNED INTERVENTIONS: 97110-Therapeutic exercises, 97530- Therapeutic activity, [id number]- Neuromuscular re-education, 97535- Self Care, [id number]- Manual therapy, [id number]- Gait training, 97014- Electrical stimulation (unattended), 97035- Ultrasound, 62130- Ionotophoresis 4mg /ml Dexamethasone, Patient/Family education, Taping, Dry Needling, Joint mobilization, Spinal mobilization, Cryotherapy, and Moist heat.  PLAN FOR NEXT SESSION: Review and progress HEP, core and hip strengthening as tolerated, body mechanics and ADL modifications    Jena Gauss, PT  07/21/2023, 9:56 AM

## 2023-07-25 ENCOUNTER — Ambulatory Visit: Payer: No Typology Code available for payment source | Admitting: Physical Therapy

## 2023-07-28 ENCOUNTER — Encounter: Payer: Self-pay | Admitting: Physical Therapy

## 2023-07-28 ENCOUNTER — Ambulatory Visit: Payer: No Typology Code available for payment source | Admitting: Physical Therapy

## 2023-07-28 DIAGNOSIS — M5416 Radiculopathy, lumbar region: Secondary | ICD-10-CM | POA: Diagnosis not present

## 2023-07-28 DIAGNOSIS — R252 Cramp and spasm: Secondary | ICD-10-CM | POA: Diagnosis not present

## 2023-07-28 DIAGNOSIS — M25551 Pain in right hip: Secondary | ICD-10-CM | POA: Diagnosis not present

## 2023-07-28 NOTE — Therapy (Signed)
OUTPATIENT PHYSICAL THERAPY TREATMENT   Patient Name: KIRSTIE QUIRAM MRN: [id number] DOB:[birth year], 52 y.o., female Today's Date: 07/28/2023  END OF SESSION:  PT End of Session - 07/28/23 0848     Visit Number 4    Date for PT Re-Evaluation 08/30/23    Authorization Type AETNA/[place] preferred    PT Start Time 0848    PT Stop Time 0935    PT Time Calculation (min) 47 min    Activity Tolerance Patient tolerated treatment well    Behavior During Therapy [facility] for tasks assessed/performed             Past Medical History:  Diagnosis Date   Abnormal chest x-ray    Anemia    Aortic atherosclerosis (HCC)    Hemoptysis    Pulmonary hypertension (HCC)    Sarcoid    bronch negative 09-2008 ACE 93   Tachycardia    Unspecified asthma(493.90)    Past Surgical History:  Procedure Laterality Date   BIOPSY  05/06/2022   Procedure: BIOPSY;  Surgeon: Charlott Holler, MD;  Location: [facility] ENDOSCOPY;  Service: Pulmonary;;   BRONCHIAL WASHINGS  05/06/2022   Procedure: BRONCHIAL WASHINGS;  Surgeon: Charlott Holler, MD;  Location: [facility] ENDOSCOPY;  Service: Pulmonary;;   BRONCHOSCOPY  09-2008   Negative   CESAREAN SECTION     HEMOSTASIS CONTROL  05/06/2022   Procedure: HEMOSTASIS CONTROL;  Surgeon: Charlott Holler, MD;  Location: [facility] ENDOSCOPY;  Service: Pulmonary;;   TUBAL LIGATION     1995   VIDEO BRONCHOSCOPY N/A 05/06/2022   Procedure: VIDEO BRONCHOSCOPY WITHOUT FLUORO;  Surgeon: Charlott Holler, MD;  Location: [facility] ENDOSCOPY;  Service: Pulmonary;  Laterality: N/A;   Patient Active Problem List   Diagnosis Date Noted   Insomnia 11/01/2022   Current chronic use of systemic steroids 09/28/2022   Former smoker 09/28/2022   Prediabetes 09/28/2022   Vitamin D deficiency 09/28/2022   Abnormal CT of the chest    Other secondary pulmonary hypertension (HCC) 12/28/2021   Tachycardia 10/12/2021   Hyperlipidemia 10/12/2021   Acute bronchitis 11/16/2020   Rash and nonspecific skin eruption  09/18/2020   Upper respiratory infection, acute 06/10/2017   Bilateral hand pain 01/13/2017   Pain, joint, shoulder, left 01/13/2017   Pain in left ankle and joints of left foot 01/13/2017   High risk medication use 01/13/2017   Nausea 08/09/2016   Cough 03/09/2016   Class 1 obesity without serious comorbidity with body mass index (BMI) of 34.0 to 34.9 in adult 01/22/2016   Right foot pain 01/21/2016   Routine general medical examination at a health care facility 12/11/2013   Visit for screening mammogram 03/07/2012   Anemia, iron deficiency 02/10/2012   Hemoptysis 09/07/2010   Sarcoidosis (HCC) 09/17/2008   Asthma in remission 09/03/2008   Allergic rhinitis due to pollen 09/03/2008    PCP: Olive Bass, FNP   REFERRING PROVIDER: Olive Bass,*   REFERRING DIAG:  N82.956 (ICD-10-CM) - Right hip pain  M54.50 (ICD-10-CM) - Low back pain, unspecified back pain laterality, unspecified chronicity, unspecified whether sciatica present    Rationale for Evaluation and Treatment: Rehabilitation  THERAPY DIAG:  Pain in right hip  Radiculopathy, lumbar region  Cramp and spasm  ONSET DATE: June 2024  SUBJECTIVE:  SUBJECTIVE STATEMENT: No pain today in back or hips.  Shoulders bother her today.  Lot of cooking for Thanksgiving.    PERTINENT HISTORY:  Sarcoidosis, R sciatica  PAIN:  Are you having pain? Yes: NPRS scale: 0/10 Pain location: Glute min region and shoots down leg Pain description: sharp pain Aggravating factors: prolonged sitting, standing or lying on it Relieving factors: Tylenol, changing position  PRECAUTIONS: None  RED FLAGS: None   WEIGHT BEARING RESTRICTIONS: No  FALLS:  Has patient fallen in last 6 months? No  LIVING ENVIRONMENT: Lives with:  lives with their spouse Lives in: House/apartment Stairs: Yes: Internal: 16 steps; on right going up and External: 5 steps; can reach both Has following equipment at home: None  OCCUPATION: on disability from sarcoidosis  PLOF: Independent  PATIENT GOALS: get this pain gone  NEXT MD VISIT: none scheduled  OBJECTIVE:  Note: Objective measures were completed at Evaluation unless otherwise noted.  DIAGNOSTIC FINDINGS:  XR R hip: Mild degenerative change of the right hip.  XR lumbar: negative  PATIENT SURVEYS:   17 / 50 = 34.0 %   COGNITION: Overall cognitive status: Within functional limits for tasks assessed     SENSATION: WFL  MUSCLE LENGTH: Tight HS B (right is nerve pull also), piriformis R>L  POSTURE: increased lumbar lordosis  PALPATION: Marked TTP and increased tissue tension and trigger points in B gluteus med R>L, R glute min Pain with R unilateral L4/5 and L5/S1 mobs  LUMBAR ROM: Flex  pain bil post thigh, WNL    LOWER EXTREMITY ROM:   WNL  LOWER EXTREMITY MMT:    MMT Right eval Left eval  Hip flexion 4 5  Hip extension 4+ 5  Hip abduction 4+* 4+  Hip adduction 5 5  Hip internal rotation    Hip external rotation    Knee flexion    Knee extension 5 5  Ankle dorsiflexion 5 5  Ankle plantarflexion    Ankle inversion    Ankle eversion     (Blank rows = not tested)  LUMBAR SPECIAL TESTS:  Straight leg raise test: Positive, Slump test: Negative, and FABER test: Negative Negative prone knee bend  FUNCTIONAL TESTS:  5 times sit to stand: 15.8 sec  GAIT: Distance walked: 20 Assistive device utilized: None Level of assistance: Complete Independence Comments: decreased stance time R  TODAY'S TREATMENT:                                                                                                                              DATE:    07/28/23 Therapeutic Exercise: to improve strength and mobility.  Demo, verbal and tactile cues throughout for  technique. Bike L1 x 6 min  Quad Cat cows x 10  Child pose x 3 Quad leg extensions x 10 r/l Lock clams  2 x 10 bil R/L Reverse clams 2 x 10 R/L Manual Therapy: to decrease muscle spasm and pain and improve  mobility STM/TPR to bil UT/LS STM/TPR to R glut med, TFL Modalities: MHP x 5 min to R hip  07/21/2023 Therapeutic Exercise: to improve strength and mobility.  Demo, verbal and tactile cues throughout for technique. Bike L2 x 6 min  Standing extension x 10  Squats x 10  -heels raised Wall squats 5 x 10 sec hold  Prone press-ups x 15 Prone hip extensions x 10 r/L Manual Therapy: to decrease muscle spasm and pain and improve mobility STM/TPR to R glutes, lumbar paraspinals, R UPA mobs grade 2-3  07/18/2023 Therapeutic Exercise: to improve strength and mobility.  Demo, verbal and tactile cues throughout for technique.  Nustep L5 x 6 min  Standing side glide - preferred L side, x 10  Standing lumbar extension x 10 Supine PPT x 10  Bridges with TrA contraction x 10 Prone leg extension 2 x 5 R/L Manual Therapy: to decrease muscle spasm and pain and improve mobility STM/TPR to R glutes, lumbar paraspinals, R UPA mobs grade 2-3, skilled palpation and monitoring during dry needling. Trigger Point Dry-Needling  Treatment instructions: Expect mild to moderate muscle soreness. S/S of pneumothorax if dry needled over a lung field, and to seek immediate medical attention should they occur. Patient verbalized understanding of these instructions and education. Patient Consent Given: Yes Education handout provided: Yes Muscles treated: R glut med, R lumbar multifidi L5 Electrical stimulation performed: No Parameters: N/A Treatment response/outcome: Twitch Response Elicited and Palpable Increase in Muscle Length  PATIENT EDUCATION:  Education details: HEP review Person educated: Patient Education method: Explanation, Demonstration, and Handouts Education comprehension: verbalized  understanding and returned demonstration  HOME EXERCISE PROGRAM: Access Code: [email or web address] URL: [email or web address] Date: 07/18/2023 Prepared by: Harrie Foreman  Exercises - Prone Press Up  - 2 x daily - 7 x weekly - 1-3 sets - 10 reps - Standing Lumbar Extension  - 2 x daily - 7 x weekly - 1-3 sets - 10 reps - Standing Lumbar Extension at Wall - Forearms  - 2 x daily - 7 x weekly - 1-3 sets - 10 reps - Supine 90/90 Sciatic Nerve Glide with Knee Flexion/Extension  - 1 x daily - 3-4 x weekly - 1-2 sets - 10 reps - 5 sec hold - Seated Piriformis Stretch with Trunk Bend (Mirrored)  - 2 x daily - 7 x weekly - 1 sets - 3 reps - 30-60 sec  hold - Lateral Shift Correction at Wall  - 1 x daily - 7 x weekly - 3 sets - 10 reps - Supine Posterior Pelvic Tilt  - 1 x daily - 7 x weekly - 2 sets - 10 reps - Supine Bridge  - 1 x daily - 7 x weekly - 2 sets - 10 reps - Beginner Prone Single Leg Raise  - 1 x daily - 7 x weekly - 2 sets - 10 reps  ASSESSMENT:  CLINICAL IMPRESSION: HENZLEY LAFAUCI reported no pain today in back, felt problem more in hip.  Focused on hip strengthening exercises, these unfortunately did flare her hip pain with increased pain down leg to knee, so placed on MHP and did not progress HEP.  DISAYA KOZLOFF continues to demonstrate potential for improvement and would benefit from continued skilled therapy to address impairments.     OBJECTIVE IMPAIRMENTS: Abnormal gait, decreased endurance, decreased strength, hypomobility, increased muscle spasms, impaired flexibility, postural dysfunction, and pain.   ACTIVITY LIMITATIONS: bending, sitting, standing, sleeping, and locomotion level  PARTICIPATION LIMITATIONS: cleaning, laundry, driving,  shopping, and community activity  PERSONAL FACTORS: Fitness, Time since onset of injury/illness/exacerbation, and 1 comorbidity: Sarcoidosis  are also affecting patient's functional outcome.   REHAB POTENTIAL:  Excellent  CLINICAL DECISION MAKING: Stable/uncomplicated  EVALUATION COMPLEXITY: Low   GOALS: Goals reviewed with patient? Yes  SHORT TERM GOALS: Target date: 08/02/2023   Patient will be independent with initial HEP.  Baseline:  Goal status: MET 07/18/23  2.  Patient will report centralization of radicular symptoms.  Baseline:  Goal status: MET  07/21/23- met   LONG TERM GOALS: Target date: 08/30/2023   Patient will be independent with advanced/ongoing HEP to improve outcomes and carryover.  Baseline:  Goal status: IN PROGRESS  2.  Patient will report 85% improvement in low back and R pain with sitting and walking to improve QOL.  Baseline:  Goal status: IN PROGRESS  3.  Patient will be able to sleep without waking from pain. Baseline:  Goal status: IN PROGRESS  4.  Patient will demonstrate improved strength to 5/5 to normalize allow for correct body mechanics and gait. Baseline:  Goal status: IN PROGRESS  5.  Patient will report 11/50 on MOI to demonstrate improved functional ability.  Baseline:  17 / 50 = 34.0 % Goal status: IN PROGRESS   6.  Patient to demonstrate ability to achieve and maintain good spinal alignment/posturing and body mechanics needed for daily activities. Baseline:  Goal status: IN PROGRESS   PLAN:  PT FREQUENCY: 2x/week  PT DURATION: 8 weeks  PLANNED INTERVENTIONS: 97110-Therapeutic exercises, 97530- Therapeutic activity, [id number]- Neuromuscular re-education, 97535- Self Care, 95621- Manual therapy, [id number]- Gait training, 97014- Electrical stimulation (unattended), 97035- Ultrasound, 30865- Ionotophoresis 4mg /ml Dexamethasone, Patient/Family education, Taping, Dry Needling, Joint mobilization, Spinal mobilization, Cryotherapy, and Moist heat.  PLAN FOR NEXT SESSION: Review and progress HEP, core and hip strengthening as tolerated, body mechanics and ADL modifications    Jena Gauss, PT  07/28/2023, 12:07 PM

## 2023-07-31 DIAGNOSIS — R7303 Prediabetes: Secondary | ICD-10-CM | POA: Diagnosis not present

## 2023-08-01 ENCOUNTER — Ambulatory Visit: Payer: No Typology Code available for payment source | Admitting: Physical Therapy

## 2023-08-01 ENCOUNTER — Encounter: Payer: Self-pay | Admitting: Physical Therapy

## 2023-08-01 DIAGNOSIS — R252 Cramp and spasm: Secondary | ICD-10-CM

## 2023-08-01 DIAGNOSIS — M25551 Pain in right hip: Secondary | ICD-10-CM | POA: Diagnosis not present

## 2023-08-01 DIAGNOSIS — M5416 Radiculopathy, lumbar region: Secondary | ICD-10-CM

## 2023-08-01 NOTE — Therapy (Signed)
OUTPATIENT PHYSICAL THERAPY TREATMENT   Patient Name: Nancy Villarreal MRN: [id number] DOB:June 01, 1971, 52 y.o., female Today's Date: 08/01/2023  END OF SESSION:  PT End of Session - 08/01/23 0853     Visit Number 5    Date for PT Re-Evaluation 08/30/23    Authorization Type AETNA/[place] preferred    PT Start Time 0852    PT Stop Time 0930    PT Time Calculation (min) 38 min             Past Medical History:  Diagnosis Date   Abnormal chest x-ray    Anemia    Aortic atherosclerosis (HCC)    Hemoptysis    Pulmonary hypertension (HCC)    Sarcoid    bronch negative 09-2008 ACE 93   Tachycardia    Unspecified asthma(493.90)    Past Surgical History:  Procedure Laterality Date   BIOPSY  05/06/2022   Procedure: BIOPSY;  Surgeon: Charlott Holler, MD;  Location: [facility] ENDOSCOPY;  Service: Pulmonary;;   BRONCHIAL WASHINGS  05/06/2022   Procedure: BRONCHIAL WASHINGS;  Surgeon: Charlott Holler, MD;  Location: [facility] ENDOSCOPY;  Service: Pulmonary;;   BRONCHOSCOPY  09-2008   Negative   CESAREAN SECTION     HEMOSTASIS CONTROL  05/06/2022   Procedure: HEMOSTASIS CONTROL;  Surgeon: Charlott Holler, MD;  Location: [facility] ENDOSCOPY;  Service: Pulmonary;;   TUBAL LIGATION     1995   VIDEO BRONCHOSCOPY N/A 05/06/2022   Procedure: VIDEO BRONCHOSCOPY WITHOUT FLUORO;  Surgeon: Charlott Holler, MD;  Location: [facility] ENDOSCOPY;  Service: Pulmonary;  Laterality: N/A;   Patient Active Problem List   Diagnosis Date Noted   Insomnia 11/01/2022   Current chronic use of systemic steroids 09/28/2022   Former smoker 09/28/2022   Prediabetes 09/28/2022   Vitamin D deficiency 09/28/2022   Abnormal CT of the chest    Other secondary pulmonary hypertension (HCC) 12/28/2021   Tachycardia 10/12/2021   Hyperlipidemia 10/12/2021   Acute bronchitis 11/16/2020   Rash and nonspecific skin eruption 09/18/2020   Upper respiratory infection, acute 06/10/2017   Bilateral hand pain 01/13/2017   Pain, joint, shoulder,  left 01/13/2017   Pain in left ankle and joints of left foot 01/13/2017   High risk medication use 01/13/2017   Nausea 08/09/2016   Cough 03/09/2016   Class 1 obesity without serious comorbidity with body mass index (BMI) of 34.0 to 34.9 in adult 01/22/2016   Right foot pain 01/21/2016   Routine general medical examination at a health care facility 12/11/2013   Visit for screening mammogram 03/07/2012   Anemia, iron deficiency 02/10/2012   Hemoptysis 09/07/2010   Sarcoidosis (HCC) 09/17/2008   Asthma in remission 09/03/2008   Allergic rhinitis due to pollen 09/03/2008    PCP: Olive Bass, FNP   REFERRING PROVIDER: Olive Bass,*   REFERRING DIAG:  G31.517 (ICD-10-CM) - Right hip pain  M54.50 (ICD-10-CM) - Low back pain, unspecified back pain laterality, unspecified chronicity, unspecified whether sciatica present    Rationale for Evaluation and Treatment: Rehabilitation  THERAPY DIAG:  Pain in right hip  Radiculopathy, lumbar region  Cramp and spasm  ONSET DATE: June 2024  SUBJECTIVE:  SUBJECTIVE STATEMENT: Doing a lot of standing and cooking, so back is hurting more.    PERTINENT HISTORY:  Sarcoidosis, R sciatica  PAIN:  Are you having pain? Yes: NPRS scale: 5/10 Pain location: low back Pain description: dull Aggravating factors: prolonged sitting, standing or lying on it Relieving factors: Tylenol, changing position  PRECAUTIONS: None  RED FLAGS: None   WEIGHT BEARING RESTRICTIONS: No  FALLS:  Has patient fallen in last 6 months? No  LIVING ENVIRONMENT: Lives with: lives with their spouse Lives in: House/apartment Stairs: Yes: Internal: 16 steps; on right going up and External: 5 steps; can reach both Has following equipment at home:  None  OCCUPATION: on disability from sarcoidosis  PLOF: Independent  PATIENT GOALS: get this pain gone  NEXT MD VISIT: none scheduled  OBJECTIVE:  Note: Objective measures were completed at Evaluation unless otherwise noted.  DIAGNOSTIC FINDINGS:  XR R hip: Mild degenerative change of the right hip.  XR lumbar: negative  PATIENT SURVEYS:   17 / 50 = 34.0 %   COGNITION: Overall cognitive status: Within functional limits for tasks assessed     SENSATION: WFL  MUSCLE LENGTH: Tight HS B (right is nerve pull also), piriformis R>L  POSTURE: increased lumbar lordosis  PALPATION: Marked TTP and increased tissue tension and trigger points in B gluteus med R>L, R glute min Pain with R unilateral L4/5 and L5/S1 mobs  LUMBAR ROM: Flex  pain bil post thigh, WNL    LOWER EXTREMITY ROM:   WNL  LOWER EXTREMITY MMT:    MMT Right eval Left eval  Hip flexion 4 5  Hip extension 4+ 5  Hip abduction 4+* 4+  Hip adduction 5 5  Hip internal rotation    Hip external rotation    Knee flexion    Knee extension 5 5  Ankle dorsiflexion 5 5  Ankle plantarflexion    Ankle inversion    Ankle eversion     (Blank rows = not tested)  LUMBAR SPECIAL TESTS:  Straight leg raise test: Positive, Slump test: Negative, and FABER test: Negative Negative prone knee bend  FUNCTIONAL TESTS:  5 times sit to stand: 15.8 sec  GAIT: Distance walked: 20 Assistive device utilized: None Level of assistance: Complete Independence Comments: decreased stance time R  TODAY'S TREATMENT:                                                                                                                              DATE:   08/01/23 Therapeutic Exercise: to improve strength and mobility.  Demo, verbal and tactile cues throughout for technique. Nustep L5 x 6 min  LTR x 10  R leg isometric extension  Isometric hip adduction/abduction  Bridges with ball squeeze Manual Therapy: to decrease muscle spasm  and pain and improve mobility IASTM with foam roller to R glutes, QL, lumbar paraspinals, STM/TPR to R lumbar paraspinals, R UPA mobs lumbar spine grade 2-3  07/28/23 Therapeutic  Exercise: to improve strength and mobility.  Demo, verbal and tactile cues throughout for technique. Bike L1 x 6 min  Quad Cat cows x 10  Child pose x 3 Quad leg extensions x 10 r/l Lock clams  2 x 10 bil R/L Reverse clams 2 x 10 R/L Manual Therapy: to decrease muscle spasm and pain and improve mobility STM/TPR to bil UT/LS STM/TPR to R glut med, TFL Modalities: MHP x 5 min to R hip  07/21/2023 Therapeutic Exercise: to improve strength and mobility.  Demo, verbal and tactile cues throughout for technique. Bike L2 x 6 min  Standing extension x 10  Squats x 10  -heels raised Wall squats 5 x 10 sec hold  Prone press-ups x 15 Prone hip extensions x 10 r/L Manual Therapy: to decrease muscle spasm and pain and improve mobility STM/TPR to R glutes, lumbar paraspinals, R UPA mobs grade 2-3  PATIENT EDUCATION:  Education details: HEP review Person educated: Patient Education method: Programmer, multimedia, Facilities manager, and Handouts Education comprehension: verbalized understanding and returned demonstration  HOME EXERCISE PROGRAM: Access Code: [email or web address] URL: [email or web address] Date: 07/18/2023 Prepared by: Harrie Foreman  Exercises - Prone Press Up  - 2 x daily - 7 x weekly - 1-3 sets - 10 reps - Standing Lumbar Extension  - 2 x daily - 7 x weekly - 1-3 sets - 10 reps - Standing Lumbar Extension at Wall - Forearms  - 2 x daily - 7 x weekly - 1-3 sets - 10 reps - Supine 90/90 Sciatic Nerve Glide with Knee Flexion/Extension  - 1 x daily - 3-4 x weekly - 1-2 sets - 10 reps - 5 sec hold - Seated Piriformis Stretch with Trunk Bend (Mirrored)  - 2 x daily - 7 x weekly - 1 sets - 3 reps - 30-60 sec  hold - Lateral Shift Correction at Wall  - 1 x daily - 7 x weekly - 3 sets - 10 reps - Supine Posterior  Pelvic Tilt  - 1 x daily - 7 x weekly - 2 sets - 10 reps - Supine Bridge  - 1 x daily - 7 x weekly - 2 sets - 10 reps - Beginner Prone Single Leg Raise  - 1 x daily - 7 x weekly - 2 sets - 10 reps   ASSESSMENT:  CLINICAL IMPRESSION: PHOENYX GACIA again reported more pain in R low back.  She had positive supine to long sit test, corrected with muscle energy technique, continued with core strengthening, followed by manual therapy to decrease muscle spasm and pain, reported decreased pain after interventions and declined modalities.  WYLLA MEDLEY continues to demonstrate potential for improvement and would benefit from continued skilled therapy to address impairments.     OBJECTIVE IMPAIRMENTS: Abnormal gait, decreased endurance, decreased strength, hypomobility, increased muscle spasms, impaired flexibility, postural dysfunction, and pain.   ACTIVITY LIMITATIONS: bending, sitting, standing, sleeping, and locomotion level  PARTICIPATION LIMITATIONS: cleaning, laundry, driving, shopping, and community activity  PERSONAL FACTORS: Fitness, Time since onset of injury/illness/exacerbation, and 1 comorbidity: Sarcoidosis  are also affecting patient's functional outcome.   REHAB POTENTIAL: Excellent  CLINICAL DECISION MAKING: Stable/uncomplicated  EVALUATION COMPLEXITY: Low   GOALS: Goals reviewed with patient? Yes  SHORT TERM GOALS: Target date: 08/02/2023   Patient will be independent with initial HEP.  Baseline:  Goal status: MET 07/18/23  2.  Patient will report centralization of radicular symptoms.  Baseline:  Goal status: MET  07/21/23- met   LONG TERM GOALS: Target  date: 08/30/2023   Patient will be independent with advanced/ongoing HEP to improve outcomes and carryover.  Baseline:  Goal status: IN PROGRESS  2.  Patient will report 85% improvement in low back and R pain with sitting and walking to improve QOL.  Baseline:  Goal status: IN PROGRESS  3.  Patient  will be able to sleep without waking from pain. Baseline:  Goal status: IN PROGRESS  4.  Patient will demonstrate improved strength to 5/5 to normalize allow for correct body mechanics and gait. Baseline:  Goal status: IN PROGRESS  5.  Patient will report 11/50 on MOI to demonstrate improved functional ability.  Baseline:  17 / 50 = 34.0 % Goal status: IN PROGRESS   6.  Patient to demonstrate ability to achieve and maintain good spinal alignment/posturing and body mechanics needed for daily activities. Baseline:  Goal status: IN PROGRESS   PLAN:  PT FREQUENCY: 2x/week  PT DURATION: 8 weeks  PLANNED INTERVENTIONS: 97110-Therapeutic exercises, 97530- Therapeutic activity, [id number]- Neuromuscular re-education, 97535- Self Care, 16109- Manual therapy, [id number]- Gait training, 97014- Electrical stimulation (unattended), 97035- Ultrasound, 60454- Ionotophoresis 4mg /ml Dexamethasone, Patient/Family education, Taping, Dry Needling, Joint mobilization, Spinal mobilization, Cryotherapy, and Moist heat.  PLAN FOR NEXT SESSION: Review and progress HEP, core and hip strengthening as tolerated, body mechanics and ADL modifications    Jena Gauss, PT  08/01/2023, 1:20 PM

## 2023-08-06 ENCOUNTER — Encounter: Payer: Self-pay | Admitting: Internal Medicine

## 2023-08-06 ENCOUNTER — Encounter: Payer: Self-pay | Admitting: Physical Therapy

## 2023-08-07 ENCOUNTER — Ambulatory Visit: Payer: No Typology Code available for payment source | Admitting: Physical Therapy

## 2023-08-07 ENCOUNTER — Telehealth: Payer: Self-pay | Admitting: *Deleted

## 2023-08-07 NOTE — Telephone Encounter (Signed)
Converted from a mychart message:  Coughing up moderate amounts of blood since Saturday. Coughing, chest, back pain and fatigue. Soreness at base of throat, probably from coughing. Increased predisone to 20 mg , taking Tylenol for chest pain and using a heating pad. Please review and advise.   Primary Pulmonologist: Young Last office visit and with whom: 03/13/2023 Young What do we see them for (pulmonary problems): Sarcoidosis Last OV assessment/plan:  Instructions   Return in about 6 months (around 09/13/2023). Script sent refilling prednisone 5 mg.  For now try taking 10 mg daily, increasing to 15 or 20 mg daily when needed for bleeding.   Order- CXR   dx sarcoid   Order- lab Angiotensin Converting Enzyme level   dx sarcoid   Ok to cancel July 22 Appt. We can see you in 6 months unless you need sooner.       Was appointment offered to patient (explain)?  No, do you want to offer her one of your RNA slots?   Reason for call: States she has been coughing up blood is bright red and a table spoon 3 times per day.  Having wheezing and sob.  She is not on any blood thinning products.  Had chills on Thursday, none since then.  Started feeling bad with chils on Wednesday (11/27) evening into Thursday (11/28) and then passed. She is not on any inhalers or nebs.  She has not tried any over the counter cough medications as she said they do not help.  Do you want to offer her one of the RNA slots?  (examples of things to ask: : When did symptoms start? Fever? Cough? Productive? Color to sputum? More sputum than usual? Wheezing? Have you needed increased oxygen? Are you taking your respiratory medications? What over the counter measures have you tried?)  Allergies  Allergen Reactions   Hydrocodone Nausea And Vomiting    Immunization History  Administered Date(s) Administered   DTaP 06/06/2022   Influenza Split 06/12/2012   Influenza Whole 05/04/2011   Influenza,inj,Quad PF,6+ Mos  07/16/2013, 06/16/2014, 07/03/2018, 05/31/2019, 08/04/2020, 05/19/2022   Influenza-Unspecified 08/04/2020, 08/10/2021   PFIZER(Purple Top)SARS-COV-2 Vaccination 11/27/2019, 12/18/2019, 08/20/2020, 03/11/2021, 06/06/2022   Pfizer Covid-19 Vaccine Bivalent Booster 34yrs & up 08/10/2021   Pneumococcal Polysaccharide-23 05/04/2011   Pneumococcal-Unspecified 08/05/2018   Tdap 03/07/2012   Unspecified SARS-COV-2 Vaccination 11/27/2019, 12/18/2019, 08/20/2020   Zoster Recombinant(Shingrix) 08/10/2021

## 2023-08-07 NOTE — Telephone Encounter (Signed)
Please send Zpak 250 mg, # 6, 2 today then one tab daily. Agree with prednisone.   Let me know if bleeding doesn't stop soon.

## 2023-08-10 ENCOUNTER — Ambulatory Visit: Payer: No Typology Code available for payment source | Admitting: Physical Therapy

## 2023-08-14 ENCOUNTER — Ambulatory Visit: Payer: No Typology Code available for payment source | Attending: Family | Admitting: Physical Therapy

## 2023-08-14 ENCOUNTER — Encounter: Payer: Self-pay | Admitting: Physical Therapy

## 2023-08-14 DIAGNOSIS — M5416 Radiculopathy, lumbar region: Secondary | ICD-10-CM | POA: Diagnosis not present

## 2023-08-14 DIAGNOSIS — M25551 Pain in right hip: Secondary | ICD-10-CM | POA: Diagnosis not present

## 2023-08-14 DIAGNOSIS — R252 Cramp and spasm: Secondary | ICD-10-CM | POA: Diagnosis not present

## 2023-08-14 NOTE — Therapy (Signed)
OUTPATIENT PHYSICAL THERAPY TREATMENT   Patient Name: Nancy Villarreal MRN: [id number] DOB:Jan 11, 1971, 52 y.o., female Today's Date: 08/14/2023  END OF SESSION:  PT End of Session - 08/14/23 0854     Visit Number 6    Date for PT Re-Evaluation 08/30/23    Authorization Type [employer] preferred    PT Start Time 0853    PT Stop Time 0930    PT Time Calculation (min) 37 min    Activity Tolerance Patient tolerated treatment well    Behavior During Therapy [facility] for tasks assessed/performed             Past Medical History:  Diagnosis Date   Abnormal chest x-ray    Anemia    Aortic atherosclerosis (HCC)    Hemoptysis    Pulmonary hypertension (HCC)    Sarcoid    bronch negative 09-2008 ACE 93   Tachycardia    Unspecified asthma(493.90)    Past Surgical History:  Procedure Laterality Date   BIOPSY  05/06/2022   Procedure: BIOPSY;  Surgeon: Charlott Holler, MD;  Location: [facility] ENDOSCOPY;  Service: Pulmonary;;   BRONCHIAL WASHINGS  05/06/2022   Procedure: BRONCHIAL WASHINGS;  Surgeon: Charlott Holler, MD;  Location: [facility] ENDOSCOPY;  Service: Pulmonary;;   BRONCHOSCOPY  09-2008   Negative   CESAREAN SECTION     HEMOSTASIS CONTROL  05/06/2022   Procedure: HEMOSTASIS CONTROL;  Surgeon: Charlott Holler, MD;  Location: [facility] ENDOSCOPY;  Service: Pulmonary;;   TUBAL LIGATION     1995   VIDEO BRONCHOSCOPY N/A 05/06/2022   Procedure: VIDEO BRONCHOSCOPY WITHOUT FLUORO;  Surgeon: Charlott Holler, MD;  Location: [facility] ENDOSCOPY;  Service: Pulmonary;  Laterality: N/A;   Patient Active Problem List   Diagnosis Date Noted   Insomnia 11/01/2022   Current chronic use of systemic steroids 09/28/2022   Former smoker 09/28/2022   Prediabetes 09/28/2022   Vitamin D deficiency 09/28/2022   Abnormal CT of the chest    Other secondary pulmonary hypertension (HCC) 12/28/2021   Tachycardia 10/12/2021   Hyperlipidemia 10/12/2021   Acute bronchitis 11/16/2020   Rash and nonspecific skin eruption  09/18/2020   Upper respiratory infection, acute 06/10/2017   Bilateral hand pain 01/13/2017   Pain, joint, shoulder, left 01/13/2017   Pain in left ankle and joints of left foot 01/13/2017   High risk medication use 01/13/2017   Nausea 08/09/2016   Cough 03/09/2016   Class 1 obesity without serious comorbidity with body mass index (BMI) of 34.0 to 34.9 in adult 01/22/2016   Right foot pain 01/21/2016   Routine general medical examination at a health care facility 12/11/2013   Visit for screening mammogram 03/07/2012   Anemia, iron deficiency 02/10/2012   Hemoptysis 09/07/2010   Sarcoidosis (HCC) 09/17/2008   Asthma in remission 09/03/2008   Allergic rhinitis due to pollen 09/03/2008    PCP: Olive Bass, FNP   REFERRING PROVIDER: Olive Bass,*   REFERRING DIAG:  W09.811 (ICD-10-CM) - Right hip pain  M54.50 (ICD-10-CM) - Low back pain, unspecified back pain laterality, unspecified chronicity, unspecified whether sciatica present    Rationale for Evaluation and Treatment: Rehabilitation  THERAPY DIAG:  Pain in right hip  Radiculopathy, lumbar region  Cramp and spasm  ONSET DATE: June 2024  SUBJECTIVE:  SUBJECTIVE STATEMENT: Having trouble with lungs, upped the prednisone, so not having any hip or back pain.     PERTINENT HISTORY:  Sarcoidosis, R sciatica  PAIN:  Are you having pain? Yes: NPRS scale: 0/10 Pain location: low back Pain description: dull Aggravating factors: prolonged sitting, standing or lying on it Relieving factors: Tylenol, changing position  PRECAUTIONS: None  RED FLAGS: None   WEIGHT BEARING RESTRICTIONS: No  FALLS:  Has patient fallen in last 6 months? No  LIVING ENVIRONMENT: Lives with: lives with their spouse Lives in:  House/apartment Stairs: Yes: Internal: 16 steps; on right going up and External: 5 steps; can reach both Has following equipment at home: None  OCCUPATION: on disability from sarcoidosis  PLOF: Independent  PATIENT GOALS: get this pain gone  NEXT MD VISIT: none scheduled  OBJECTIVE:  Note: Objective measures were completed at Evaluation unless otherwise noted.  DIAGNOSTIC FINDINGS:  XR R hip: Mild degenerative change of the right hip.  XR lumbar: negative  PATIENT SURVEYS:   17 / 50 = 34.0 %   COGNITION: Overall cognitive status: Within functional limits for tasks assessed     SENSATION: WFL  MUSCLE LENGTH: Tight HS B (right is nerve pull also), piriformis R>L  POSTURE: increased lumbar lordosis  PALPATION: Marked TTP and increased tissue tension and trigger points in B gluteus med R>L, R glute min Pain with R unilateral L4/5 and L5/S1 mobs  LUMBAR ROM: Flex  pain bil post thigh, WNL    LOWER EXTREMITY ROM:   WNL  LOWER EXTREMITY MMT:    MMT Right eval Left eval  Hip flexion 4 5  Hip extension 4+ 5  Hip abduction 4+* 4+  Hip adduction 5 5  Hip internal rotation    Hip external rotation    Knee flexion    Knee extension 5 5  Ankle dorsiflexion 5 5  Ankle plantarflexion    Ankle inversion    Ankle eversion     (Blank rows = not tested)  LUMBAR SPECIAL TESTS:  Straight leg raise test: Positive, Slump test: Negative, and FABER test: Negative Negative prone knee bend  FUNCTIONAL TESTS:  5 times sit to stand: 15.8 sec  GAIT: Distance walked: 20 Assistive device utilized: None Level of assistance: Complete Independence Comments: decreased stance time R  TODAY'S TREATMENT:                                                                                                                              DATE:    08/14/23 Therapeutic Exercise: to improve strength and mobility.  Demo, verbal and tactile cues throughout for technique. Nustep L5 x 7 min   Standing isometric clamshell 5 x 5 sec hold into ball Bird dogs x 5 each side - dyspnea on exertion  Pursed lip breathing Breathing exercises with arms Self Care: Information on The Breather respiratory muscle trainer.  [email or web address]  08/01/23 Therapeutic Exercise: to improve strength and mobility.  Demo, verbal and tactile cues throughout for technique. Nustep L5 x 6 min  LTR x 10  R leg isometric extension  Isometric hip adduction/abduction  Bridges with ball squeeze Manual Therapy: to decrease muscle spasm and pain and improve mobility IASTM with foam roller to R glutes, QL, lumbar paraspinals, STM/TPR to R lumbar paraspinals, R UPA mobs lumbar spine grade 2-3  07/28/23 Therapeutic Exercise: to improve strength and mobility.  Demo, verbal and tactile cues throughout for technique. Bike L1 x 6 min  Quad Cat cows x 10  Child pose x 3 Quad leg extensions x 10 r/l Lock clams  2 x 10 bil R/L Reverse clams 2 x 10 R/L Manual Therapy: to decrease muscle spasm and pain and improve mobility STM/TPR to bil UT/LS STM/TPR to R glut med, TFL Modalities: MHP x 5 min to R hip  07/21/2023 Therapeutic Exercise: to improve strength and mobility.  Demo, verbal and tactile cues throughout for technique. Bike L2 x 6 min  Standing extension x 10  Squats x 10  -heels raised Wall squats 5 x 10 sec hold  Prone press-ups x 15 Prone hip extensions x 10 r/L Manual Therapy: to decrease muscle spasm and pain and improve mobility STM/TPR to R glutes, lumbar paraspinals, R UPA mobs grade 2-3  PATIENT EDUCATION:  Education details: see self care, pursed lip breathing. Person educated: Patient Education method: Explanation, Demonstration, and Handouts Education comprehension: verbalized understanding and returned demonstration  HOME EXERCISE PROGRAM: Access Code: [email or web address] URL: [email or web address] Date:  07/18/2023 Prepared by: Harrie Foreman  Exercises - Prone Press Up  - 2 x daily - 7 x weekly - 1-3 sets - 10 reps - Standing Lumbar Extension  - 2 x daily - 7 x weekly - 1-3 sets - 10 reps - Standing Lumbar Extension at Wall - Forearms  - 2 x daily - 7 x weekly - 1-3 sets - 10 reps - Supine 90/90 Sciatic Nerve Glide with Knee Flexion/Extension  - 1 x daily - 3-4 x weekly - 1-2 sets - 10 reps - 5 sec hold - Seated Piriformis Stretch with Trunk Bend (Mirrored)  - 2 x daily - 7 x weekly - 1 sets - 3 reps - 30-60 sec  hold - Lateral Shift Correction at Wall  - 1 x daily - 7 x weekly - 3 sets - 10 reps - Supine Posterior Pelvic Tilt  - 1 x daily - 7 x weekly - 2 sets - 10 reps - Supine Bridge  - 1 x daily - 7 x weekly - 2 sets - 10 reps - Beginner Prone Single Leg Raise  - 1 x daily - 7 x weekly - 2 sets - 10 reps    [email or web address]  ASSESSMENT:  CLINICAL IMPRESSION: Nancy Villarreal reported no pain today, however she did just increase her dosage of prednisone, which may account for decreased pain.  She was extremely limited today by shortness of breath with exercises.  Discussed her sarcoidosis and impact on exercise and breathing, gave her pursed lip breathing exercises, which did slow her heart rate (dropped from in the 90s to 80s while increasing her SpO2), and discussed the Breather which is a respiratory muscle trainer, as she is no longer able to sing in her choir.  She is also interested in Pulmonary rehab, as she has even fainted when she has gotten upset before and was unable to breathe.  Today her O2 sats ranged from 96-98%.   Nancy Villarreal continues  to demonstrate potential for improvement and would benefit from continued skilled therapy to address impairments.     OBJECTIVE IMPAIRMENTS: Abnormal gait, decreased endurance, decreased strength, hypomobility, increased muscle spasms, impaired flexibility, postural  dysfunction, and pain.   ACTIVITY LIMITATIONS: bending, sitting, standing, sleeping, and locomotion level  PARTICIPATION LIMITATIONS: cleaning, laundry, driving, shopping, and community activity  PERSONAL FACTORS: Fitness, Time since onset of injury/illness/exacerbation, and 1 comorbidity: Sarcoidosis  are also affecting patient's functional outcome.   REHAB POTENTIAL: Excellent  CLINICAL DECISION MAKING: Stable/uncomplicated  EVALUATION COMPLEXITY: Low   GOALS: Goals reviewed with patient? Yes  SHORT TERM GOALS: Target date: 08/02/2023   Patient will be independent with initial HEP.  Baseline:  Goal status: MET 07/18/23  2.  Patient will report centralization of radicular symptoms.  Baseline:  Goal status: MET  07/21/23- met   LONG TERM GOALS: Target date: 08/30/2023   Patient will be independent with advanced/ongoing HEP to improve outcomes and carryover.  Baseline:  Goal status: IN PROGRESS  2.  Patient will report 85% improvement in low back and R pain with sitting and walking to improve QOL.  Baseline:  Goal status: IN PROGRESS  3.  Patient will be able to sleep without waking from pain. Baseline:  Goal status: IN PROGRESS  4.  Patient will demonstrate improved strength to 5/5 to normalize allow for correct body mechanics and gait. Baseline:  Goal status: IN PROGRESS  5.  Patient will report 11/50 on MOI to demonstrate improved functional ability.  Baseline:  17 / 50 = 34.0 % Goal status: IN PROGRESS   6.  Patient to demonstrate ability to achieve and maintain good spinal alignment/posturing and body mechanics needed for daily activities. Baseline:  Goal status: IN PROGRESS   PLAN:  PT FREQUENCY: 2x/week  PT DURATION: 8 weeks  PLANNED INTERVENTIONS: 97110-Therapeutic exercises, 97530- Therapeutic activity, [id number]- Neuromuscular re-education, 97535- Self Care, [id number]- Manual therapy, [id number]- Gait training, 97014- Electrical stimulation (unattended),  97035- Ultrasound, 09811- Ionotophoresis 4mg /ml Dexamethasone, Patient/Family education, Taping, Dry Needling, Joint mobilization, Spinal mobilization, Cryotherapy, and Moist heat.  PLAN FOR NEXT SESSION: Review and progress HEP, core and hip strengthening as tolerated, body mechanics and ADL modifications    Jena Gauss, PT  08/14/2023, 1:46 PM

## 2023-08-15 ENCOUNTER — Other Ambulatory Visit: Payer: Self-pay | Admitting: Family

## 2023-08-15 NOTE — Therapy (Incomplete)
OUTPATIENT PHYSICAL THERAPY TREATMENT   Patient Name: Nancy Villarreal MRN: [id number] DOB:[birth year], 52 y.o., female Today's Date: 08/15/2023  END OF SESSION:    Past Medical History:  Diagnosis Date   Abnormal chest x-ray    Anemia    Aortic atherosclerosis (HCC)    Hemoptysis    Pulmonary hypertension (HCC)    Sarcoid    bronch negative 09-2008 ACE 93   Tachycardia    Unspecified asthma(493.90)    Past Surgical History:  Procedure Laterality Date   BIOPSY  05/06/2022   Procedure: BIOPSY;  Surgeon: Charlott Holler, MD;  Location: [facility] ENDOSCOPY;  Service: Pulmonary;;   BRONCHIAL WASHINGS  05/06/2022   Procedure: BRONCHIAL WASHINGS;  Surgeon: Charlott Holler, MD;  Location: [facility] ENDOSCOPY;  Service: Pulmonary;;   BRONCHOSCOPY  09-2008   Negative   CESAREAN SECTION     HEMOSTASIS CONTROL  05/06/2022   Procedure: HEMOSTASIS CONTROL;  Surgeon: Charlott Holler, MD;  Location: [facility] ENDOSCOPY;  Service: Pulmonary;;   TUBAL LIGATION     1995   VIDEO BRONCHOSCOPY N/A 05/06/2022   Procedure: VIDEO BRONCHOSCOPY WITHOUT FLUORO;  Surgeon: Charlott Holler, MD;  Location: [facility] ENDOSCOPY;  Service: Pulmonary;  Laterality: N/A;   Patient Active Problem List   Diagnosis Date Noted   Insomnia 11/01/2022   Current chronic use of systemic steroids 09/28/2022   Former smoker 09/28/2022   Prediabetes 09/28/2022   Vitamin D deficiency 09/28/2022   Abnormal CT of the chest    Other secondary pulmonary hypertension (HCC) 12/28/2021   Tachycardia 10/12/2021   Hyperlipidemia 10/12/2021   Acute bronchitis 11/16/2020   Rash and nonspecific skin eruption 09/18/2020   Upper respiratory infection, acute 06/10/2017   Bilateral hand pain 01/13/2017   Pain, joint, shoulder, left 01/13/2017   Pain in left ankle and joints of left foot 01/13/2017   High risk medication use 01/13/2017   Nausea 08/09/2016   Cough 03/09/2016   Class 1 obesity without serious comorbidity with body mass index (BMI) of 34.0 to  34.9 in adult 01/22/2016   Right foot pain 01/21/2016   Routine general medical examination at a health care facility 12/11/2013   Visit for screening mammogram 03/07/2012   Anemia, iron deficiency 02/10/2012   Hemoptysis 09/07/2010   Sarcoidosis (HCC) 09/17/2008   Asthma in remission 09/03/2008   Allergic rhinitis due to pollen 09/03/2008    PCP: Olive Bass, FNP   REFERRING PROVIDER: Olive Bass,*   REFERRING DIAG:  E31.540 (ICD-10-CM) - Right hip pain  M54.50 (ICD-10-CM) - Low back pain, unspecified back pain laterality, unspecified chronicity, unspecified whether sciatica present    Rationale for Evaluation and Treatment: Rehabilitation  THERAPY DIAG:  No diagnosis found.  ONSET DATE: June 2024  SUBJECTIVE:  SUBJECTIVE STATEMENT: ***    PERTINENT HISTORY:  Sarcoidosis, R sciatica  PAIN:  Are you having pain? Yes: NPRS scale: 0/10 Pain location: low back Pain description: dull Aggravating factors: prolonged sitting, standing or lying on it Relieving factors: Tylenol, changing position  PRECAUTIONS: None  RED FLAGS: None   WEIGHT BEARING RESTRICTIONS: No  FALLS:  Has patient fallen in last 6 months? No  LIVING ENVIRONMENT: Lives with: lives with their spouse Lives in: House/apartment Stairs: Yes: Internal: 16 steps; on right going up and External: 5 steps; can reach both Has following equipment at home: None  OCCUPATION: on disability from sarcoidosis  PLOF: Independent  PATIENT GOALS: get this pain gone  NEXT MD VISIT: none scheduled  OBJECTIVE:  Note: Objective measures were completed at Evaluation unless otherwise noted.  DIAGNOSTIC FINDINGS:  XR R hip: Mild degenerative change of the right hip.  XR lumbar: negative  PATIENT SURVEYS:    17 / 50 = 34.0 %   COGNITION: Overall cognitive status: Within functional limits for tasks assessed     SENSATION: WFL  MUSCLE LENGTH: Tight HS B (right is nerve pull also), piriformis R>L  POSTURE: increased lumbar lordosis  PALPATION: Marked TTP and increased tissue tension and trigger points in B gluteus med R>L, R glute min Pain with R unilateral L4/5 and L5/S1 mobs  LUMBAR ROM: Flex  pain bil post thigh, WNL    LOWER EXTREMITY ROM:   WNL  LOWER EXTREMITY MMT:    MMT Right eval Left eval  Hip flexion 4 5  Hip extension 4+ 5  Hip abduction 4+* 4+  Hip adduction 5 5  Hip internal rotation    Hip external rotation    Knee flexion    Knee extension 5 5  Ankle dorsiflexion 5 5  Ankle plantarflexion    Ankle inversion    Ankle eversion     (Blank rows = not tested)  LUMBAR SPECIAL TESTS:  Straight leg raise test: Positive, Slump test: Negative, and FABER test: Negative Negative prone knee bend  FUNCTIONAL TESTS:  5 times sit to stand: 15.8 sec  GAIT: Distance walked: 20 Assistive device utilized: None Level of assistance: Complete Independence Comments: decreased stance time R  TODAY'S TREATMENT:                                                                                                                              DATE:    08/16/23 Therapeutic Exercise: to improve strength and mobility.  Demo, verbal and tactile cues throughout for technique. Nustep L5 x 7 min  Standing isometric clamshell 5 x 5 sec hold into ball Bird dogs x 5 each side - dyspnea on exertion  LTR x 10  R leg isometric extension  Isometric hip adduction/abduction  Bridges with ball squeeze Manual Therapy: to decrease muscle spasm and pain and improve mobility IASTM with foam roller to R glutes, QL, lumbar paraspinals, STM/TPR to R lumbar paraspinals,  R UPA mobs lumbar spine grade 2-3   08/14/23 Therapeutic Exercise: to improve strength and mobility.  Demo, verbal and tactile  cues throughout for technique. Nustep L5 x 7 min  Standing isometric clamshell 5 x 5 sec hold into ball Bird dogs x 5 each side - dyspnea on exertion  Pursed lip breathing Breathing exercises with arms Self Care: Information on The Breather respiratory muscle trainer.  [email or web address]  08/01/23 Therapeutic Exercise: to improve strength and mobility.  Demo, verbal and tactile cues throughout for technique. Nustep L5 x 6 min  LTR x 10  R leg isometric extension  Isometric hip adduction/abduction  Bridges with ball squeeze Manual Therapy: to decrease muscle spasm and pain and improve mobility IASTM with foam roller to R glutes, QL, lumbar paraspinals, STM/TPR to R lumbar paraspinals, R UPA mobs lumbar spine grade 2-3  07/28/23 Therapeutic Exercise: to improve strength and mobility.  Demo, verbal and tactile cues throughout for technique. Bike L1 x 6 min  Quad Cat cows x 10  Child pose x 3 Quad leg extensions x 10 r/l Lock clams  2 x 10 bil R/L Reverse clams 2 x 10 R/L Manual Therapy: to decrease muscle spasm and pain and improve mobility STM/TPR to bil UT/LS STM/TPR to R glut med, TFL Modalities: MHP x 5 min to R hip  07/21/2023 Therapeutic Exercise: to improve strength and mobility.  Demo, verbal and tactile cues throughout for technique. Bike L2 x 6 min  Standing extension x 10  Squats x 10  -heels raised Wall squats 5 x 10 sec hold  Prone press-ups x 15 Prone hip extensions x 10 r/L Manual Therapy: to decrease muscle spasm and pain and improve mobility STM/TPR to R glutes, lumbar paraspinals, R UPA mobs grade 2-3  PATIENT EDUCATION:  Education details: see self care, pursed lip breathing. Person educated: Patient Education method: Explanation, Demonstration, and Handouts Education comprehension: verbalized understanding and returned demonstration  HOME EXERCISE PROGRAM: Access Code:  [email or web address] URL: [email or web address] Date: 07/18/2023 Prepared by: Harrie Foreman  Exercises - Prone Press Up  - 2 x daily - 7 x weekly - 1-3 sets - 10 reps - Standing Lumbar Extension  - 2 x daily - 7 x weekly - 1-3 sets - 10 reps - Standing Lumbar Extension at Wall - Forearms  - 2 x daily - 7 x weekly - 1-3 sets - 10 reps - Supine 90/90 Sciatic Nerve Glide with Knee Flexion/Extension  - 1 x daily - 3-4 x weekly - 1-2 sets - 10 reps - 5 sec hold - Seated Piriformis Stretch with Trunk Bend (Mirrored)  - 2 x daily - 7 x weekly - 1 sets - 3 reps - 30-60 sec  hold - Lateral Shift Correction at Wall  - 1 x daily - 7 x weekly - 3 sets - 10 reps - Supine Posterior Pelvic Tilt  - 1 x daily - 7 x weekly - 2 sets - 10 reps - Supine Bridge  - 1 x daily - 7 x weekly - 2 sets - 10 reps - Beginner Prone Single Leg Raise  - 1 x daily - 7 x weekly - 2 sets - 10 reps    [email or web address]  ASSESSMENT:  CLINICAL IMPRESSION: ***Nancy Villarreal reported no pain today, however she did just increase her dosage of prednisone, which may account for decreased pain.  She was extremely limited today by shortness of breath with exercises.  Discussed her sarcoidosis and impact on exercise and breathing,  gave her pursed lip breathing exercises, which did slow her heart rate (dropped from in the 90s to 80s while increasing her SpO2), and discussed the Breather which is a respiratory muscle trainer, as she is no longer able to sing in her choir.  She is also interested in Pulmonary rehab, as she has even fainted when she has gotten upset before and was unable to breathe.  Today her O2 sats ranged from 96-98%.   Nancy Villarreal continues to demonstrate potential for improvement and would benefit from continued skilled therapy to address impairments.     OBJECTIVE IMPAIRMENTS: Abnormal gait, decreased endurance, decreased strength, hypomobility,  increased muscle spasms, impaired flexibility, postural dysfunction, and pain.   ACTIVITY LIMITATIONS: bending, sitting, standing, sleeping, and locomotion level  PARTICIPATION LIMITATIONS: cleaning, laundry, driving, shopping, and community activity  PERSONAL FACTORS: Fitness, Time since onset of injury/illness/exacerbation, and 1 comorbidity: Sarcoidosis  are also affecting patient's functional outcome.   REHAB POTENTIAL: Excellent  CLINICAL DECISION MAKING: Stable/uncomplicated  EVALUATION COMPLEXITY: Low   GOALS: Goals reviewed with patient? Yes  SHORT TERM GOALS: Target date: 08/02/2023   Patient will be independent with initial HEP.  Baseline:  Goal status: MET 07/18/23  2.  Patient will report centralization of radicular symptoms.  Baseline:  Goal status: MET  07/21/23- met   LONG TERM GOALS: Target date: 08/30/2023   Patient will be independent with advanced/ongoing HEP to improve outcomes and carryover.  Baseline:  Goal status: IN PROGRESS  2.  Patient will report 85% improvement in low back and R pain with sitting and walking to improve QOL.  Baseline:  Goal status: IN PROGRESS  3.  Patient will be able to sleep without waking from pain. Baseline:  Goal status: IN PROGRESS  4.  Patient will demonstrate improved strength to 5/5 to normalize allow for correct body mechanics and gait. Baseline:  Goal status: IN PROGRESS  5.  Patient will report 11/50 on MOI to demonstrate improved functional ability.  Baseline:  17 / 50 = 34.0 % Goal status: IN PROGRESS   6.  Patient to demonstrate ability to achieve and maintain good spinal alignment/posturing and body mechanics needed for daily activities. Baseline:  Goal status: IN PROGRESS   PLAN:  PT FREQUENCY: 2x/week  PT DURATION: 8 weeks  PLANNED INTERVENTIONS: 97110-Therapeutic exercises, 97530- Therapeutic activity, [id number]- Neuromuscular re-education, 97535- Self Care, 52841- Manual therapy, [id number]- Gait  training, 97014- Electrical stimulation (unattended), 97035- Ultrasound, 32440- Ionotophoresis 4mg /ml Dexamethasone, Patient/Family education, Taping, Dry Needling, Joint mobilization, Spinal mobilization, Cryotherapy, and Moist heat.  PLAN FOR NEXT SESSION: Review and progress HEP, core and hip strengthening as tolerated, body mechanics and ADL modifications    Solon Palm, PT   08/15/2023, 9:10 PM

## 2023-08-16 ENCOUNTER — Ambulatory Visit: Payer: No Typology Code available for payment source | Admitting: Physical Therapy

## 2023-08-21 ENCOUNTER — Encounter: Payer: Self-pay | Admitting: Physical Therapy

## 2023-08-21 ENCOUNTER — Ambulatory Visit: Payer: No Typology Code available for payment source | Admitting: Physical Therapy

## 2023-08-21 DIAGNOSIS — M25551 Pain in right hip: Secondary | ICD-10-CM

## 2023-08-21 DIAGNOSIS — M5416 Radiculopathy, lumbar region: Secondary | ICD-10-CM

## 2023-08-21 DIAGNOSIS — R252 Cramp and spasm: Secondary | ICD-10-CM | POA: Diagnosis not present

## 2023-08-21 NOTE — Therapy (Signed)
OUTPATIENT PHYSICAL THERAPY TREATMENT/Discharge Summary   Patient Name: Nancy Villarreal MRN: [id number] DOB:1971-03-04, 52 y.o., female Today's Date: 08/21/2023  END OF SESSION:  PT End of Session - 08/21/23 0857     Visit Number 7    Date for PT Re-Evaluation 08/30/23    Authorization Type [employer]ET[employer]/ preferred    PT Start Time 0855    PT Stop Time 0933    PT Time Calculation (min) 38 min    Activity Tolerance Patient tolerated treatment well    Behavior During Therapy [facility] for tasks assessed/performed             Past Medical History:  Diagnosis Date   Abnormal chest x-ray    Anemia    Aortic atherosclerosis (HCC)    Hemoptysis    Pulmonary hypertension (HCC)    Sarcoid    bronch negative 09-2008 ACE 93   Tachycardia    Unspecified asthma(493.90)    Past Surgical History:  Procedure Laterality Date   BIOPSY  05/06/2022   Procedure: BIOPSY;  Surgeon: Charlott Holler, MD;  Location: [facility] ENDOSCOPY;  Service: Pulmonary;;   BRONCHIAL WASHINGS  05/06/2022   Procedure: BRONCHIAL WASHINGS;  Surgeon: Charlott Holler, MD;  Location: [facility] ENDOSCOPY;  Service: Pulmonary;;   BRONCHOSCOPY  09-2008   Negative   CESAREAN SECTION     HEMOSTASIS CONTROL  05/06/2022   Procedure: HEMOSTASIS CONTROL;  Surgeon: Charlott Holler, MD;  Location: [facility] ENDOSCOPY;  Service: Pulmonary;;   TUBAL LIGATION     1995   VIDEO BRONCHOSCOPY N/A 05/06/2022   Procedure: VIDEO BRONCHOSCOPY WITHOUT FLUORO;  Surgeon: Charlott Holler, MD;  Location: [facility] ENDOSCOPY;  Service: Pulmonary;  Laterality: N/A;   Patient Active Problem List   Diagnosis Date Noted   Insomnia 11/01/2022   Current chronic use of systemic steroids 09/28/2022   Former smoker 09/28/2022   Prediabetes 09/28/2022   Vitamin D deficiency 09/28/2022   Abnormal CT of the chest    Other secondary pulmonary hypertension (HCC) 12/28/2021   Tachycardia 10/12/2021   Hyperlipidemia 10/12/2021   Acute bronchitis 11/16/2020   Rash and nonspecific  skin eruption 09/18/2020   Upper respiratory infection, acute 06/10/2017   Bilateral hand pain 01/13/2017   Pain, joint, shoulder, left 01/13/2017   Pain in left ankle and joints of left foot 01/13/2017   High risk medication use 01/13/2017   Nausea 08/09/2016   Cough 03/09/2016   Class 1 obesity without serious comorbidity with body mass index (BMI) of 34.0 to 34.9 in adult 01/22/2016   Right foot pain 01/21/2016   Routine general medical examination at a health care facility 12/11/2013   Visit for screening mammogram 03/07/2012   Anemia, iron deficiency 02/10/2012   Hemoptysis 09/07/2010   Sarcoidosis (HCC) 09/17/2008   Asthma in remission 09/03/2008   Allergic rhinitis due to pollen 09/03/2008    PCP: Olive Bass, FNP   REFERRING PROVIDER: Olive Bass,*   REFERRING DIAG:  W09.811 (ICD-10-CM) - Right hip pain  M54.50 (ICD-10-CM) - Low back pain, unspecified back pain laterality, unspecified chronicity, unspecified whether sciatica present    Rationale for Evaluation and Treatment: Rehabilitation  THERAPY DIAG:  Pain in right hip  Radiculopathy, lumbar region  Cramp and spasm  ONSET DATE: June 2024  SUBJECTIVE:  SUBJECTIVE STATEMENT: Hasn't had any problems with hip or back, breathing is her biggest problem.   Needs to cancel visits after today.     PERTINENT HISTORY:  Sarcoidosis, R sciatica  PAIN:  Are you having pain? Yes: NPRS scale: 0/10 Pain location: low back Pain description: dull Aggravating factors: prolonged sitting, standing or lying on it Relieving factors: Tylenol, changing position  PRECAUTIONS: None  RED FLAGS: None   WEIGHT BEARING RESTRICTIONS: No  FALLS:  Has patient fallen in last 6 months? No  LIVING ENVIRONMENT: Lives with:  lives with their spouse Lives in: House/apartment Stairs: Yes: Internal: 16 steps; on right going up and External: 5 steps; can reach both Has following equipment at home: None  OCCUPATION: on disability from sarcoidosis  PLOF: Independent  PATIENT GOALS: get this pain gone  NEXT MD VISIT: none scheduled  OBJECTIVE:  Note: Objective measures were completed at Evaluation unless otherwise noted.  DIAGNOSTIC FINDINGS:  XR R hip: Mild degenerative change of the right hip.  XR lumbar: negative  PATIENT SURVEYS:   17 / 50 = 34.0 %; 08/21/23 4/50= 8%   COGNITION: Overall cognitive status: Within functional limits for tasks assessed     SENSATION: WFL  MUSCLE LENGTH: Tight HS B (right is nerve pull also), piriformis R>L  POSTURE: increased lumbar lordosis  PALPATION: Marked TTP and increased tissue tension and trigger points in B gluteus med R>L, R glute min Pain with R unilateral L4/5 and L5/S1 mobs  LUMBAR ROM: Flex  pain bil post thigh, WNL    LOWER EXTREMITY ROM:   WNL  LOWER EXTREMITY MMT:    MMT Right eval Left eval Right 08/21/23  Hip flexion 4 5 5   Hip extension 4+ 5 5  Hip abduction 4+* 4+ 5  Hip adduction 5 5   Hip internal rotation     Hip external rotation     Knee flexion     Knee extension 5 5   Ankle dorsiflexion 5 5   Ankle plantarflexion     Ankle inversion     Ankle eversion      (Blank rows = not tested)  LUMBAR SPECIAL TESTS:  Straight leg raise test: Positive, Slump test: Negative, and FABER test: Negative Negative prone knee bend  FUNCTIONAL TESTS:  5 times sit to stand: 15.8 sec  GAIT: Distance walked: 20 Assistive device utilized: None Level of assistance: Complete Independence Comments: decreased stance time R  TODAY'S TREATMENT:                                                                                                                              DATE:    08/21/2023 Therapeutic Exercise: to improve strength and  mobility.  Demo, verbal and tactile cues throughout for technique. Nustep L6 x 6 min for warm up and muscle perfusion Leg extension 20# 3 x 10  Hamstring curls 20# 2x 10 Leg Press 20# 3 x 10 - cues  to keep small bend in knees to avoid hyperextension Rows 20# 3 x 10  Lat pull downs (standing) 3 x 10 20# Therapeutic Activity:  review of goals, education MMT Modified Oswestry Information on aquatics program at [facility]   08/14/23 Therapeutic Exercise: to improve strength and mobility.  Demo, verbal and tactile cues throughout for technique. Nustep L5 x 7 min  Standing isometric clamshell 5 x 5 sec hold into ball Bird dogs x 5 each side - dyspnea on exertion  Pursed lip breathing Breathing exercises with arms Self Care: Information on The Breather respiratory muscle trainer.  [email or web address]  08/01/23 Therapeutic Exercise: to improve strength and mobility.  Demo, verbal and tactile cues throughout for technique. Nustep L5 x 6 min  LTR x 10  R leg isometric extension  Isometric hip adduction/abduction  Bridges with ball squeeze Manual Therapy: to decrease muscle spasm and pain and improve mobility IASTM with foam roller to R glutes, QL, lumbar paraspinals, STM/TPR to R lumbar paraspinals, R UPA mobs lumbar spine grade 2-3   PATIENT EDUCATION:  Education details: information on aquatics program at [employer] Person educated: Patient Education method: Chief Technology Officer Education comprehension: verbalized understanding  HOME EXERCISE PROGRAM: Access Code: [email or web address] URL: [email or web address] Date: 07/18/2023 Prepared by: Harrie Foreman  Exercises - Prone Press Up  - 2 x daily - 7 x weekly - 1-3 sets - 10 reps - Standing Lumbar Extension  - 2 x daily - 7 x weekly - 1-3 sets - 10 reps - Standing Lumbar Extension at Wall - Forearms  - 2 x daily - 7 x weekly - 1-3 sets - 10 reps - Supine 90/90  Sciatic Nerve Glide with Knee Flexion/Extension  - 1 x daily - 3-4 x weekly - 1-2 sets - 10 reps - 5 sec hold - Seated Piriformis Stretch with Trunk Bend (Mirrored)  - 2 x daily - 7 x weekly - 1 sets - 3 reps - 30-60 sec  hold - Lateral Shift Correction at Wall  - 1 x daily - 7 x weekly - 3 sets - 10 reps - Supine Posterior Pelvic Tilt  - 1 x daily - 7 x weekly - 2 sets - 10 reps - Supine Bridge  - 1 x daily - 7 x weekly - 2 sets - 10 reps - Beginner Prone Single Leg Raise  - 1 x daily - 7 x weekly - 2 sets - 10 reps    [email or web address]  ASSESSMENT:  CLINICAL IMPRESSION: OMEKA REICHERT reported no pain today, and had no questions on HEP.  Reviewed goals, and she has met or almost met all goals, with 80% improvement overall in back/R hip pain and her Modified Oswestry has improved from 34% impairment to 8% impairment.   Today discussed exercises that may not cause as much dyspnea on exertion including resistance training, reviewed machine exercises and technique today with very good tolerance.  Also provided information on the [employer], since they have an arthritis certified pool and swimming lessons.  NAIJA NERIO is agreeable and appropriate for discharge today.    OBJECTIVE IMPAIRMENTS: Abnormal gait, decreased endurance, decreased strength, hypomobility, increased muscle spasms, impaired flexibility, postural dysfunction, and pain.   ACTIVITY LIMITATIONS: bending, sitting, standing, sleeping, and locomotion level  PARTICIPATION LIMITATIONS: cleaning, laundry, driving, shopping, and community activity  PERSONAL FACTORS: Fitness, Time since onset of injury/illness/exacerbation, and 1 comorbidity: Sarcoidosis  are also affecting patient's functional outcome.   REHAB POTENTIAL: Excellent  CLINICAL DECISION MAKING: Stable/uncomplicated  EVALUATION COMPLEXITY: Low   GOALS: Goals reviewed with patient?  Yes  SHORT TERM GOALS: Target date: 08/02/2023   Patient will be independent with initial HEP.  Baseline:  Goal status: MET 07/18/23  2.  Patient will report centralization of radicular symptoms.  Baseline:  Goal status: MET  07/21/23- met   LONG TERM GOALS: Target date: 08/30/2023   Patient will be independent with advanced/ongoing HEP to improve outcomes and carryover.  Baseline:  Goal status: MET 08/21/23  2.  Patient will report 85% improvement in low back and R pain with sitting and walking to improve QOL.  Baseline:  Goal status:  ALMOST MET 08/21/23 - 80%  3.  Patient will be able to sleep without waking from pain. Baseline:  Goal status: MET 08/21/23   4.  Patient will demonstrate improved strength to 5/5 to normalize allow for correct body mechanics and gait. Baseline:  Goal status: MET 08/21/23  5.  Patient will report 11/50 on MOI to demonstrate improved functional ability.  Baseline:  17 / 50 = 34.0 % Goal status: MET 4/50= 8%   6.  Patient to demonstrate ability to achieve and maintain good spinal alignment/posturing and body mechanics needed for daily activities. Baseline:  Goal status: MET 08/21/23   PLAN:  PT FREQUENCY: 2x/week  PT DURATION: 8 weeks  PLANNED INTERVENTIONS: 97110-Therapeutic exercises, 97530- Therapeutic activity, 97112- Neuromuscular re-education, 97535- Self Care, 53664- Manual therapy, [id number]- Gait training, 97014- Electrical stimulation (unattended), 97035- Ultrasound, 40347- Ionotophoresis 4mg /ml Dexamethasone, Patient/Family education, Taping, Dry Needling, Joint mobilization, Spinal mobilization, Cryotherapy, and Moist heat.  PLAN FOR NEXT SESSION: Review and progress HEP, core and hip strengthening as tolerated, body mechanics and ADL modifications    Jena Gauss, PT  08/21/2023, 11:28 AM  PHYSICAL THERAPY DISCHARGE SUMMARY  Visits from Start of Care: 7  Current functional level related to goals / functional  outcomes: All goals met, 80% improvement, Mod Oswestry 4/50   Remaining deficits: dyspnea on exertion, breathing difficulties, recommend pulmonary rehabilitation   Education / Equipment: HEP  Plan: Patient agrees to discharge.  Patient is being discharged due to meeting the stated rehab goals.     Jena Gauss, PT  08/21/2023 11:28 AM

## 2023-08-22 ENCOUNTER — Encounter: Payer: Self-pay | Admitting: Family

## 2023-08-23 ENCOUNTER — Encounter: Payer: No Typology Code available for payment source | Admitting: Physical Therapy

## 2023-08-28 ENCOUNTER — Encounter: Payer: No Typology Code available for payment source | Admitting: Physical Therapy

## 2023-08-30 DIAGNOSIS — R7303 Prediabetes: Secondary | ICD-10-CM | POA: Diagnosis not present

## 2023-09-13 NOTE — Progress Notes (Deleted)
 Patient ID: Nancy Villarreal. Brien, female    DOB: [birth year], 53 y.o.   MRN: [id number]  HPI F former smoker followed here with sarcoid Stage III,  Bronch NEG 09/24/08- dx based on mediastinal adenopathy/ interstitial disease,chronic elev ACE , response to steroid. Complicated by Iron  def anemia, plasma cell disorder. Bronch 05/06/22 PFT 10/11/16- ACE 09/30/16- 86H  ACE 12/07/17- 97, up from 89 in 2018 ACE 01/08/19- 67 WNL ACE 08/04/20- 80H ACE 12/08/21- 48,  11/25/22- 87, 03/12/24- 66 Immunoglobulins 12/19/16- IgG 2,669 PFT 12/30/22-moderate obst, slight response to BD, DLCO WNL, TLC 85% ----------------------------------------------------------------------------------------------------   03/13/23- 53yoF former smoker(2.5 pk yrs) followed here with Sarcoid Stage III ( Bronch Pos 05/06/22- Nonnecrotizing granulomas,  mediastinal adenopathy/ interstitial disease, chronic elevation of ACE, response to steroid),  Hemoptysis, Bronchitis, Complicated by Iron  def anemia, plasma cell disorder, Obesity,  -Albuterol  hfa, prednisone  10 mg daily15 mg alt 20 mg qod, Has asked PCP for [employer] for Sarcoid- Lab 12/28/21- ACE 48 wnl,  Body weight today 198 lbs -----Coughing up blood, bright red pt states she occ gets this. States she doesn't have a cough  Current episode is sitting quickly now.  Spontaneous hemoptysis episodes come on every couple of months, no apparent trigger, and last a few days.  No chest pain or changes otherwise noted.  No relation to menstrual periods. She is currently taking prednisone  10 mg daily.  We discussed a strategy of trying to hold 10 mg but increasing to 15 or 20 mg as needed for bleeding flares.  We will refill her supply.  We discussed referral to a university and we discussed alternative of methotrexate .  Currently she is not interested in either. We discussed steroid withdrawal.  Which she gets down to 10 mg daily she begins to experience aching in her knees making stairs  difficult.  She understands need to keep steroid doses low.  Once sarcoid finally stabilizes, we discussed withdrawal strategy and potential for adrenal insufficiency.[name] 12/30/22-moderate obst, slight response to BD, DLCO WNL, TLC 85% CXR 11/25/22- IMPRESSION: Chronic lung changes without evidence of acute cardiopulmonary Disease HRCT 09/02/22- IMPRESSION: 1. Moderate pulmonary fibrosis, with an upper lobe and perihilar predominance, characterized by fine nodular and confluent consolidative opacity and architectural distortion, with many nodules concentrated along the fissures and pleura (i.e. perilymphatic distribution). If characterized by ATS pulmonary fibrosis criteria, findings are in an alternative diagnosis (not UIP) pattern, and specifically highly suggestive of pulmonary sarcoidosis with some evidence of developing mass of fibrosis: Diagnosis of Idiopathic Pulmonary Fibrosis: An Official ATS/ERS/JRS/ALAT Clinical Practice Guideline. Am JINNY [employer] Crit Care Med Vol 198, Iss 5, [phone], May 06 2017. 2. Numerous coarsely calcified mediastinal and bilateral hilar lymph nodes, consistent with nodal sarcoidosis. 3. Lobular air trapping on expiratory phase imaging, suggestive of small airways disease. 4. Enlargement of the main pulmonary artery, as can be seen in pulmonary hypertension. Aortic Atherosclerosis (ICD10-I70.0).  09/14/23- 53yoF former smoker(2.5 pk yrs) followed here with Sarcoid Stage III ( Bronch Pos 05/06/22- Nonnecrotizing granulomas,  mediastinal adenopathy/ interstitial disease, chronic elevation of ACE, response to steroid),  Hemoptysis, Bronchitis, Complicated by Iron  def anemia, plasma cell disorder, Obesity,  -Albuterol  hfa, prednisone  10 mg daily15 mg alt 20 mg qod, Has asked PCP for [employer] for Sarcoid- Lab 12/28/21- ACE 48 wnl, 11/25/22- 87, 03/12/24- 66 Body weight today   CXR 03/13/23- IMPRESSION: Chronic changes. No acute  process.  Review of Systems-see HPI   + = positive HEENT:  No headaches,  Difficulty swallowing,  Tooth/dental problems,  Sore throat,                No sneezing, itching, ear ache, nasal congestion, post nasal drip,  CV:  No- chest pain,  No-PND, swelling in lower extremities, anasarca, dizziness, palpitations GI  No heartburn, indigestion, abdominal pain, nausea, vomiting,  Resp:  No acute shortness of breath with exertion .  No excess mucus, +productive cough,                  non-productive cough,  +recent coughing up of blood. + change in color of mucus.  No wheezing.  Skin: no rash or lesions. GU: MS:+ joint pain .  No decreased range of motion.  No back pain. Psych:  No change in mood or affect. No depression or anxiety.  No memory loss.  Objective:   Physical Exam General- Alert, Oriented, Affect-appropriate, Distress- none acute.  Skin-   + mild acne Lymphadenopathy- none Head- atraumatic            Eyes- Gross vision intact, PERRLA, conjunctivae clear secretions            Ears- Hearing, canals normal            Nose- Clear, No-Septal dev, mucus, polyps, erosion, perforation             Throat- Mallampati II , mucosa-clear , drainage- none, tonsils present. , + hoarse from cough                    Neck- flexible , trachea midline, no stridor , thyroid  nl, carotid no bruit Chest - symmetrical excursion , unlabored           Heart/CV- RRR , no murmur , no gallop  , no rub, nl s1 s2                           - JVD- none , edema- none, stasis changes- none, varices- none           Lung-   Cough-none, + somewhat coarse in bases, unlabored, wheeze- none,  dullness-none, rub- none           Chest wall-  Abd-  Br/ Gen/ Rectal- Not done, not indicated Extrem- cyanosis- none, clubbing, none, atrophy- none, strength- nl Neuro- grossly intact to observation

## 2023-09-15 ENCOUNTER — Ambulatory Visit: Payer: No Typology Code available for payment source | Admitting: Internal Medicine

## 2023-09-19 MED ORDER — AZITHROMYCIN 250 MG PO TABS: ORAL_TABLET | ORAL | 0 refills | Status: DC

## 2023-09-19 NOTE — Telephone Encounter (Signed)
 Called and spoke with patient, provided recommendations per Dr. Neysa.  She has a follow up scheduled with Katie on 10/25/23.  Her previous appointment with Dr. Neysa had to be rescheduled due to the weather.  She knows to call the office if the bleeding continues despite the medications.  She verbalized understanding.  Nothing further needed.

## 2023-09-30 DIAGNOSIS — R7303 Prediabetes: Secondary | ICD-10-CM | POA: Diagnosis not present

## 2023-10-06 ENCOUNTER — Encounter: Payer: Self-pay | Admitting: Internal Medicine

## 2023-10-09 ENCOUNTER — Ambulatory Visit: Payer: No Typology Code available for payment source | Admitting: Cardiovascular Disease

## 2023-10-12 NOTE — Telephone Encounter (Signed)
 Spoke with patient she is feeling better still a little scratchy throat but overall feeling better.

## 2023-10-24 ENCOUNTER — Telehealth: Payer: Self-pay

## 2023-10-24 NOTE — Telephone Encounter (Signed)
Please call and reschedule pt due to weather tomorrow. She is schedule to see Rhunette Croft, NP

## 2023-10-25 ENCOUNTER — Ambulatory Visit: Payer: No Typology Code available for payment source | Admitting: Nurse Practitioner

## 2023-10-31 DIAGNOSIS — R7303 Prediabetes: Secondary | ICD-10-CM | POA: Diagnosis not present

## 2023-11-02 ENCOUNTER — Other Ambulatory Visit: Payer: Self-pay | Admitting: Family

## 2023-11-07 ENCOUNTER — Ambulatory Visit: Payer: No Typology Code available for payment source | Admitting: Cardiovascular Disease

## 2023-11-16 ENCOUNTER — Telehealth: Payer: Self-pay | Admitting: *Deleted

## 2023-11-16 NOTE — Telephone Encounter (Signed)
 Disability forms from [employer] were received 11/10/2023 and need to be out by 11/24/2023. Forms will be placed in providers box for completion.

## 2023-11-17 ENCOUNTER — Ambulatory Visit
Payer: No Typology Code available for payment source | Attending: Cardiovascular Disease | Admitting: Cardiovascular Disease

## 2023-11-17 ENCOUNTER — Encounter: Payer: Self-pay | Admitting: Cardiovascular Disease

## 2023-11-17 VITALS — BP 120/80 | HR 85 | Ht 63.0 in | Wt 200.0 lb

## 2023-11-17 DIAGNOSIS — R000 Tachycardia, unspecified: Secondary | ICD-10-CM | POA: Diagnosis not present

## 2023-11-17 DIAGNOSIS — D869 Sarcoidosis, unspecified: Secondary | ICD-10-CM | POA: Diagnosis not present

## 2023-11-17 DIAGNOSIS — I2729 Other secondary pulmonary hypertension: Secondary | ICD-10-CM | POA: Diagnosis not present

## 2023-11-17 DIAGNOSIS — E782 Mixed hyperlipidemia: Secondary | ICD-10-CM | POA: Diagnosis not present

## 2023-11-17 NOTE — Patient Instructions (Signed)

## 2023-11-17 NOTE — Assessment & Plan Note (Signed)
 History of hyperlipidemia on statin therapy with lipid profile performed/23/24 revealing total cholesterol 232, LDL 115 and HDL of 103, acceptable for primary prevention given her coronary calcium score of 0.

## 2023-11-17 NOTE — Assessment & Plan Note (Signed)
 History of resting sinus tachycardia however this is improved since I saw her a year ago.

## 2023-11-17 NOTE — Progress Notes (Addendum)
 11/17/2023 Narcisa Ganesh [facility]   March 23, 1971  [id number]  Primary Physician Olive Bass, FNP Primary Cardiologist: Runell Gess MD FACP, [place], [place], [place]  HPI:  Nancy Villarreal is a 53 y.o.   mild to moderately overweight married African-American female mother of 2, grandmother of 4 grandchildren referred by Ria Clock, NP for evaluation of resting tachycardia.  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  I last saw her in the office 09/27/2022.  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 Maple Hudson, pulmonology.  She basically has no cardiac risk factors except for mild untreated hyperlipidemia.  There is no family history for heart disease.  She is never had a heart attack or stroke.  She denies chest pain but does get some shortness of breath primarily related to her sarcoidosis.  She is noted to have a resting sinus tachycardia although she denies palpitations.  Her thyroid function tests are normal.   She did have a 2D echocardiogram on her and that showed normal LV systolic function with hypokinesia and basal mid anteroseptal wall and inferoseptal wall performed 11/18/2021.  As result of this I did get a coronary calcium score which was 0.  I suspect that her regional wall motion abnormalities may be related to sarcoidosis involvement of her myocardium.  She also has a resting sinus tachycardia with which probably is related to this as well.  I did a event monitor on her 01/11/2022 that was essentially normal with an average heart rate of 98.  Her sarcoid has become symptomatic recently with hemoptysis.  She is been put back on steroids and is followed by Dr. Maple Hudson, her pulmonologist.   Since I saw her in the office a year ago she continues to do well for the most part except for her sarcoidosis.  She has had episodes of hemoptysis but otherwise denies chest pain or shortness of breath..   Current Meds   Medication Sig   atorvastatin (LIPITOR) 20 MG tablet Take 1 tablet (20 mg total) by mouth daily.   Cholecalciferol (VITAMIN D3) 50 MCG (2000 UT) TABS Take 50 mcg by mouth daily at 12 noon.   iron polysaccharides (NIFEREX) 150 MG capsule TAKE 1 CAPSULE(150 MG) BY MOUTH DAILY   metFORMIN (GLUCOPHAGE-XR) 500 MG 24 hr tablet Take 1 tablet (500 mg total) by mouth daily with breakfast.   predniSONE (DELTASONE) 5 MG tablet 10 mg daily. Increase to 20 mg daily for hemoptysis.   venlafaxine XR (EFFEXOR-XR) 75 MG 24 hr capsule TAKE 1 CAPSULE(75 MG) BY MOUTH DAILY WITH BREAKFAST   ZEPBOUND 2.5 MG/0.5ML Pen      Allergies  Allergen Reactions   Hydrocodone Nausea And Vomiting    Social History   Socioeconomic History   Marital status: Married    Spouse name: Gery Pray   Number of children: Not on file   Years of education: Not on file   Highest education level: Associate degree: occupational, Scientist, product/process development, or vocational program  Occupational History   Occupation: Firefighter   Occupation: Economist  Tobacco Use   Smoking status: Former    Current packs/day: 0.00    Average packs/day: 0.3 packs/day for 10.0 years (2.5 ttl pk-yrs)    Types: Cigarettes    Start date: 2001    Quit date: 08/05/2008    Years since quitting: 15.2    Passive exposure: Past   Smokeless  tobacco: Never  Vaping Use   Vaping status: Never Used  Substance and Sexual Activity   Alcohol use: Yes   Drug use: Never   Sexual activity: Not Currently    Birth control/protection: Surgical  Other Topics Concern   Not on file  Social History Narrative   ** Merged History Encounter **       Social Drivers of Health   Financial Resource Strain: Low Risk  (12/05/2022)   Overall Financial Resource Strain (CARDIA)    Difficulty of Paying Living Expenses: Not very hard  Food Insecurity: No Food Insecurity (12/05/2022)   Hunger Vital Sign    Worried About Running Out of Food in the Last Year: Never true    Ran  Out of Food in the Last Year: Never true  Transportation Needs: No Transportation Needs (12/05/2022)   PRAPARE - Administrator, Civil Service (Medical): No    Lack of Transportation (Non-Medical): No  Physical Activity: Insufficiently Active (12/05/2022)   Exercise Vital Sign    Days of Exercise per Week: 2 days    Minutes of Exercise per Session: 30 min  Stress: No Stress Concern Present (12/05/2022)   [employer] of Occupational Health - Occupational Stress Questionnaire    Feeling of Stress : Only a little  Social Connections: Socially Integrated (12/05/2022)   Social Connection and Isolation Panel [NHANES]    Frequency of Communication with Friends and Family: More than three times a week    Frequency of Social Gatherings with Friends and Family: Once a week    Attends Religious Services: More than 4 times per year    Active Member of [employer] or Organizations: Yes    Attends Banker Meetings: 1 to 4 times per year    Marital Status: Married  Catering manager Violence: Not on file     Review of Systems: General: negative for chills, fever, night sweats or weight changes.  Cardiovascular: negative for chest pain, dyspnea on exertion, edema, orthopnea, palpitations, paroxysmal nocturnal dyspnea or shortness of breath Dermatological: negative for rash Respiratory: negative for cough or wheezing Urologic: negative for hematuria Abdominal: negative for nausea, vomiting, diarrhea, bright red blood per rectum, melena, or hematemesis Neurologic: negative for visual changes, syncope, or dizziness All other systems reviewed and are otherwise negative except as noted above.    Blood pressure 120/80, pulse 85, height 5\' 3"  (1.6 m), weight 200 lb (90.7 kg), SpO2 97%.  General appearance: alert and no distress Neck: no adenopathy, no carotid bruit, no JVD, supple, symmetrical, trachea midline, and thyroid not enlarged, symmetric, no tenderness/mass/nodules Lungs: clear  to auscultation bilaterally Heart: regular rate and rhythm, S1, S2 normal, no murmur, click, rub or gallop Extremities: extremities normal, atraumatic, no cyanosis or edema Pulses: 2+ and symmetric Skin: Skin color, texture, turgor normal. No rashes or lesions Neurologic: Grossly normal  EKG EKG Interpretation Date/Time:  Friday November 17 2023 10:32:17 EDT Ventricular Rate:  85 PR Interval:  122 QRS Duration:  82 QT Interval:  344 QTC Calculation: 409 R Axis:   75  Text Interpretation: Normal sinus rhythm Normal ECG When compared with ECG of 25-Sep-2022 07:15, PREVIOUS ECG IS PRESENT Confirmed by Nanetta Batty [phone]) on 11/17/2023 10:37:40 AM    ASSESSMENT AND PLAN:   Tachycardia History of resting sinus tachycardia however this is improved since I saw her a year ago.  Hyperlipidemia History of hyperlipidemia on statin therapy with lipid profile performed/23/24 revealing total cholesterol 232, LDL 115 and HDL  of 103, acceptable for primary prevention given her coronary calcium score of 0.     Runell Gess MD FACP,FACC,FAHA, [facility] 11/17/2023 10:47 AM

## 2023-11-20 ENCOUNTER — Encounter: Payer: Self-pay | Admitting: Cardiovascular Disease

## 2023-11-27 NOTE — Telephone Encounter (Signed)
 Pt was seen in office on 11/17/23, ok to provide this note for pt. Thank you!

## 2023-11-28 DIAGNOSIS — R7303 Prediabetes: Secondary | ICD-10-CM | POA: Diagnosis not present

## 2023-12-01 ENCOUNTER — Ambulatory Visit: Admitting: Family

## 2023-12-01 ENCOUNTER — Encounter: Payer: Self-pay | Admitting: Family

## 2023-12-01 VITALS — BP 112/82 | HR 80 | Temp 98.1°F | Ht 63.0 in | Wt 194.6 lb

## 2023-12-01 DIAGNOSIS — Z23 Encounter for immunization: Secondary | ICD-10-CM

## 2023-12-01 DIAGNOSIS — R7303 Prediabetes: Secondary | ICD-10-CM

## 2023-12-01 DIAGNOSIS — Z1231 Encounter for screening mammogram for malignant neoplasm of breast: Secondary | ICD-10-CM

## 2023-12-01 DIAGNOSIS — Z0000 Encounter for general adult medical examination without abnormal findings: Secondary | ICD-10-CM | POA: Diagnosis not present

## 2023-12-01 DIAGNOSIS — Z1211 Encounter for screening for malignant neoplasm of colon: Secondary | ICD-10-CM

## 2023-12-01 MED ORDER — VENLAFAXINE HCL ER 75 MG PO CP24: 75.0000 mg | ORAL_CAPSULE | Freq: Every day | ORAL | 3 refills | Status: AC

## 2023-12-01 MED ORDER — POLYSACCHARIDE IRON COMPLEX 150 MG PO CAPS: ORAL_CAPSULE | ORAL | 3 refills | Status: AC

## 2023-12-01 MED ORDER — METFORMIN HCL ER 500 MG PO TB24: 500.0000 mg | ORAL_TABLET | Freq: Every day | ORAL | 3 refills | Status: AC

## 2023-12-01 NOTE — Progress Notes (Signed)
 lm

## 2023-12-01 NOTE — Progress Notes (Signed)
 Nancy Villarreal is a 53 y.o. 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   Tachycardia 10/12/2021   Hyperlipidemia 10/12/2021   Acute bronchitis 11/16/2020   Rash and nonspecific skin eruption 09/18/2020   Upper respiratory infection, acute 06/10/2017   Bilateral hand pain 01/13/2017   Pain, joint, shoulder, left 01/13/2017   Pain in left ankle and joints of left foot 01/13/2017   High risk medication use 01/13/2017   Nausea 08/09/2016   Cough 03/09/2016   Class 1 obesity without serious comorbidity with body mass index (BMI) of 34.0 to 34.9 in adult 01/22/2016   Right foot pain 01/21/2016   Routine general medical examination at a health care facility 12/11/2013   Visit for screening mammogram 03/07/2012   Anemia, iron deficiency 02/10/2012   Hemoptysis 09/07/2010   Sarcoidosis (HCC) 09/17/2008   Asthma in remission 09/03/2008   Allergic rhinitis due to pollen 09/03/2008    Current Outpatient Medications  Medication Sig Dispense Refill   atorvastatin (LIPITOR) 20 MG tablet Take 1 tablet (20 mg total) by mouth daily. 90 tablet 3   Cholecalciferol (VITAMIN D3) 50 MCG (2000 UT) TABS Take 50 mcg by mouth daily at 12 noon.     predniSONE (DELTASONE) 5 MG tablet 10 mg daily. Increase to 20 mg daily for hemoptysis. 200 tablet 1   ZEPBOUND 2.5 MG/0.5ML Pen      iron polysaccharides (NIFEREX) 150 MG capsule TAKE 1 CAPSULE(150 MG) BY MOUTH DAILY 90 capsule 3   metFORMIN (GLUCOPHAGE-XR) 500 MG 24 hr tablet Take 1 tablet (500 mg total) by mouth daily with breakfast. 90 tablet 3   venlafaxine XR (EFFEXOR-XR) 75 MG 24 hr capsule Take 1 capsule (75 mg total) by mouth daily with  breakfast. 90 capsule 3   No current facility-administered medications for this visit.    Allergies: Hydrocodone  Past Medical History:  Diagnosis Date   Abnormal chest x-ray    Anemia    Aortic atherosclerosis (HCC)    Hemoptysis    Pulmonary hypertension (HCC)    Sarcoid    bronch negative 09-2008 ACE 93   Tachycardia    Unspecified asthma(493.90)     Past Surgical History:  Procedure Laterality Date   BIOPSY  05/06/2022   Procedure: BIOPSY;  Surgeon: Charlott Holler, MD;  Location[facility] ENDOSCOPY;  Service: Pulmonary;;   BRONCHIAL WASHINGS  05/06/2022   Procedure: BRONCHIAL WASHINGS;  Surgeon: Charlott Holler, MD;  Location: [facility] ENDOSCOPY;  Service: Pulmonary;;   BRONCHOSCOPY  09-2008   Negative   CESAREAN SECTION     HEMOSTASIS CONTROL  05/06/2022   Procedure: HEMOSTASIS CONTROL;  Surgeon: Charlott Holler, MD;  Location: [facility] ENDOSCOPY;  Service: Pulmonary;;   TUBAL LIGATION     1995   VIDEO BRONCHOSCOPY N/A 05/06/2022   Procedure: VIDEO BRONCHOSCOPY WITHOUT FLUORO;  Surgeon: Charlott Holler, MD;  Location[facility] ENDOSCOPY;  Service: Pulmonary;  Laterality: N/A;    Family History  Problem Relation Age of Onset   Hyperlipidemia Mother    Hypertension Mother    Cancer Mother        lung    Liver disease Mother    Obesity  Mother    Diabetes Father    Hyperlipidemia Maternal Grandmother    Stroke Maternal Grandmother    Lung cancer Maternal Grandfather    Cancer Paternal Grandmother    Alcohol abuse Paternal Grandmother    Early death Paternal Grandmother    Asthma Paternal Grandfather    Healthy Daughter    Healthy Son     Social History   Tobacco Use   Smoking status: Former    Current packs/day: 0.00    Average packs/day: 0.3 packs/day for 10.0 years (2.5 ttl pk-yrs)    Types: Cigarettes    Start date: 2001    Quit date: 08/05/2008    Years since quitting: 15.3    Passive exposure: Past   Smokeless tobacco: Never  Substance Use Topics   Alcohol use: Yes     Subjective:   Presents for yearly CPE; continuing to struggle with complications from sarcoidosis- scheduled to see pulmonology next week for 6 month follow up;   Is taking Zepbound through online weight loss provider- will be increasing dose to 5 mg in the next week; does seem to be helping to offset some of the side effects of the prednisone;  Does have GYN- per patient pap smear up to date;  Labs were done with weight loss provider recently and will be having labs with pulmonology next week;   Review of Systems  Constitutional: Negative.   HENT: Negative.    Eyes: Negative.   Respiratory: Negative.    Cardiovascular: Negative.   Gastrointestinal: Negative.   Genitourinary: Negative.   Musculoskeletal: Negative.   Skin: Negative.   Neurological: Negative.   Endo/Heme/Allergies: Negative.   Psychiatric/Behavioral: Negative.       Objective:  Vitals:   12/01/23 1022  BP: 112/82  Pulse: 80  Temp: 98.1 F (36.7 C)  TempSrc: Oral  SpO2: 97%  Weight: 194 lb 9.6 oz (88.3 kg)  Height: 5\' 3"  (1.6 m)    General: Well developed, well nourished, in no acute distress  Skin : Warm and dry.  Head: Normocephalic and atraumatic  Eyes: Sclera and conjunctiva clear; pupils round and reactive to light; extraocular movements intact  Ears: External normal; canals clear; tympanic membranes normal  Oropharynx: Pink, supple. No suspicious lesions  Neck: Supple without thyromegaly, adenopathy  Lungs: Respirations unlabored; clear to auscultation bilaterally without wheeze, rales, rhonchi  CVS exam: normal rate and regular rhythm.  Abdomen: Soft; nontender; nondistended; normoactive bowel sounds; no masses or hepatosplenomegaly  Musculoskeletal: No deformities; no active joint inflammation  Extremities: No edema, cyanosis, clubbing  Vessels: Symmetric bilaterally  Neurologic: Alert and oriented; speech intact; face symmetrical; moves all extremities well; CNII-XII intact without focal  deficit   Assessment:  1. PE (physical exam), annual   2. Screening mammogram for breast cancer   3. Prediabetes   4. Colon cancer screening   5. Need for pneumococcal 20-valent conjugate vaccination     Plan:  Age appropriate preventive healthcare needs addressed; encouraged regular eye doctor and dental exams; encouraged regular exercise; will update refills as needed today; labs are being done through weight loss provider and pulmonologist; she does have GYN as well; orders updated for Cologuard and screening mammogram; follow up in 1 year, sooner prn.    No follow-ups on file.  Orders Placed This Encounter  Procedures   MM Digital Screening    Standing Status:   Future    Expiration Date:   11/30/2024    Reason for Exam (SYMPTOM  OR DIAGNOSIS  REQUIRED):   screening mammogram    Is the patient pregnant?:   No    Preferred [facility]   Pneumococcal conjugate vaccine 20-valent (Prevnar 20)   Cologuard    Requested Prescriptions   Signed Prescriptions Disp Refills   venlafaxine XR (EFFEXOR-XR) 75 MG 24 hr capsule 90 capsule 3    Sig: Take 1 capsule (75 mg total) by mouth daily with breakfast.   metFORMIN (GLUCOPHAGE-XR) 500 MG 24 hr tablet 90 tablet 3    Sig: Take 1 tablet (500 mg total) by mouth daily with breakfast.   iron polysaccharides (NIFEREX) 150 MG capsule 90 capsule 3    Sig: TAKE 1 CAPSULE(150 MG) BY MOUTH DAILY

## 2023-12-05 ENCOUNTER — Other Ambulatory Visit: Payer: Self-pay | Admitting: Family

## 2023-12-05 ENCOUNTER — Encounter: Payer: Self-pay | Admitting: Primary Care

## 2023-12-05 ENCOUNTER — Ambulatory Visit

## 2023-12-05 ENCOUNTER — Ambulatory Visit: Payer: No Typology Code available for payment source | Admitting: Primary Care

## 2023-12-05 ENCOUNTER — Encounter: Payer: Self-pay | Admitting: Family

## 2023-12-05 VITALS — BP 126/72 | HR 90 | Temp 97.3°F | Ht 63.0 in | Wt 196.8 lb

## 2023-12-05 DIAGNOSIS — D869 Sarcoidosis, unspecified: Secondary | ICD-10-CM | POA: Diagnosis not present

## 2023-12-05 DIAGNOSIS — Z7952 Long term (current) use of systemic steroids: Secondary | ICD-10-CM

## 2023-12-05 DIAGNOSIS — R042 Hemoptysis: Secondary | ICD-10-CM

## 2023-12-05 NOTE — Patient Instructions (Addendum)
 -  PULMONARY SARCOIDOSIS: Pulmonary sarcoidosis is a condition where clusters of inflammatory cells form in the lungs, causing chronic lung issues. You have been managing this with prednisone, but due to recent exacerbations, we will order a chest x-ray and check your ACE levels. If there are any abnormalities, we may consider a follow-up CT scan. We also discussed the possibility of referring you to [facility] for further management. Continue taking prednisone 10 mg daily.  -WEIGHT GAIN: Your recent weight gain is likely related to your prednisone use. You are currently taking Zepbound for weight loss, and we will continue with the current dose of 5 mg.  INSTRUCTIONS: Please complete the chest x-ray and ACE level tests as soon as possible. We will send the results to Dr. Maple Hudson and discuss any necessary follow-up, including the potential need for a CT scan or changes in your treatment plan. Let us know if you want to be referred to [facility] for a second opinion. Continue taking your medications as prescribed.  Orders:  CXR   dx sarcoid lab Angiotensin Converting Enzyme level   dx sarcoid  Follow-up 6 months with Dr. Maple Hudson

## 2023-12-05 NOTE — Progress Notes (Signed)
 @Patient  ID: Nancy Villarreal, female    DOB: 06-01-1971, 53 y.o.   MRN: [id number]  No chief complaint on file.   Referring provider: Olive Bass,*  HPI: 53 year old female, former smoker. PMH significant for HTN, asthma in remission, acute bronchitis, sarcoidosis, vit D deficiency, hyperlipidemia, obesity. Patient of Dr. Maple Hudson.   Previous [facility] pulmonary encounter:  F former smoker followed here with sarcoid Stage III,  Bronch NEG 09/24/08- dx based on mediastinal adenopathy/ interstitial disease,chronic elev ACE , response to steroid. Complicated by Iron def anemia, plasma cell disorder. Bronch 05/06/22 PFT 10/11/16- ACE 09/30/16- 86H  ACE 12/07/17- 97, up from 89 in 2018 ACE 01/08/19- 67 WNL ACE 08/04/20- 80H Immunoglobulins 12/19/16- IgG 2,669 ----------------------------------------------------------------------------------------------------  11/24/22-52yoF former smoker(2.5 pk yrs) followed here with Sarcoid Stage III ( Bronch Pos 05/06/22- Nonnecrotizing granulomas,  mediastinal adenopathy/ interstitial disease, chronic elevation of ACE, response to steroid),,  Hemoptysis, Bronchitis, Complicated by Iron def anemia, plasma cell disorder, Obesity,  -Albuterol hfa, prednisone 15 mg alt 10 mg qod, Covid vax- 6 Phizer Flu vax-had Has asked PCP for [employer] for Sarcoid- Lab 4/25- ACE 48 wnl Body weight today  -----Patient advises she has been coughing up a little a bit of blood. This started Saturday Last night was better. She notices diffuse joint aches on the days she takes 10 mg of prednisone.  We discussed flare of symptoms from underlying disease versus steroid withdrawal.  She saw a rheumatologist who also told her primary choice was between prednisone and methotrexate.  She understands side effect issues with both.  I offered second opinion referral, such as University, at any time. She denies rash or adenopathy. Has seen Dr. Barry/Cardiology following her  tachycardia.  He is aware of sarcoid diagnosis. Disability application is proceeding through [employer]. We will update labs, CXR, PFT with this visit. CXR 09/25/22- FINDINGS: Reticular and nodular opacities especially in the upper lungs correlating with the history and prior CT. Normal heart size and mediastinal contours. No acute airspace disease, edema, effusion, or pneumothorax. IMPRESSION: Chronic lung disease without acute or focal finding.  03/13/23- 52yoF former smoker(2.5 pk yrs) followed here with Sarcoid Stage III ( Bronch Pos 05/06/22- Nonnecrotizing granulomas,  mediastinal adenopathy/ interstitial disease, chronic elevation of ACE, response to steroid),  Hemoptysis, Bronchitis, Complicated by Iron def anemia, plasma cell disorder, Obesity,  -Albuterol hfa, prednisone 10 mg daily15 mg alt 20 mg qod, Has asked PCP for [employer] for Sarcoid- Lab 12/28/21- ACE 48 wnl,  Body weight today 198 lbs -----Coughing up blood, bright red pt states she occ gets this. States she doesn't have a cough  Current episode is sitting quickly now.  Spontaneous hemoptysis episodes come on every couple of months, no apparent trigger, and last a few days.  No chest pain or changes otherwise noted.  No relation to menstrual periods. She is currently taking prednisone 10 mg daily.  We discussed a strategy of trying to hold 10 mg but increasing to 15 or 20 mg as needed for bleeding flares.  We will refill her supply.  We discussed referral to a university and we discussed alternative of methotrexate.  Currently she is not interested in either. We discussed steroid withdrawal.  Which she gets down to 10 mg daily she begins to experience aching in her knees making stairs difficult.  She understands need to keep steroid doses low.  Once sarcoid finally stabilizes, we discussed withdrawal strategy and potential for adrenal  insufficiency.Marland Kitchen PFT 12/30/22-moderate obst, slight response to BD, DLCO  WNL, TLC 85% CXR 11/25/22- IMPRESSION: Chronic lung changes without evidence of acute cardiopulmonary Disease HRCT 09/02/22- IMPRESSION: 1. Moderate pulmonary fibrosis, with an upper lobe and perihilar predominance, characterized by fine nodular and confluent consolidative opacity and architectural distortion, with many nodules concentrated along the fissures and pleura (i.e. perilymphatic distribution). If characterized by ATS pulmonary fibrosis criteria, findings are in an alternative diagnosis (not UIP) pattern, and specifically highly suggestive of pulmonary sarcoidosis with some evidence of developing mass of fibrosis: Diagnosis of Idiopathic Pulmonary Fibrosis: An Official ATS/ERS/JRS/ALAT Clinical Practice Guideline. Am Rosezetta Schlatter Crit Care Med Vol 198, Iss 5, [phone], May 06 2017. 2. Numerous coarsely calcified mediastinal and bilateral hilar lymph nodes, consistent with nodal sarcoidosis. 3. Lobular air trapping on expiratory phase imaging, suggestive of small airways disease. 4. Enlargement of the main pulmonary artery, as can be seen in pulmonary hypertension. Aortic Atherosclerosis (ICD10-I70.0).   12/05/2023- Interim hx  Discussed the use of AI scribe software for clinical note transcription with the patient, who gave verbal consent to proceed.  Former smoker (2.5pk years). Sarcoid stage 3, bronch pos 05/06/22- dx based on nonnectrotizing granulomas, mediastinal adenopathy/interstitial disease. Chronic elevated ACE, response to steriod. Complicated by iron def anemia, plasma cell disorder, hemoptysis, obesity. Maintained on Albuterol hfa, prednisone 10mg  daily, increase to 15-20mg  as needed. She is not interesed in referral to a university to discuss alternative of methorexate. Lab 12/28/21- ACE 48 wnl, Body weight today 198 lbs. PFT 12/30/22-moderate obst, slight response to BD, DLCO WNL, TLC 85% CXR 11/25/22-Chronic lung changes without evidence of acute cardiopulmonary  disease  She has seen rheumatologist who discussed primary choice between prednisone and methotrexate. Following with Dr. Barry/Cardiology for tachycardia, aware of sarcoid dx.   Patient has asked PCP for [employer] [facility] disability for sarcoid. Disability application is proceeding through [employer].   Over the winter, she experienced frequent upper respiratory infections, including a suspected flu, leading to exacerbations of her sarcoidosis and episodes of hemoptysis. Her last episode of hemoptysis was in February 2025. She has a history of elevated ACE levels, which were within normal limits in April 2023. A bronchoscopy in September 2023 showed non-necrotizing granulomas and mediastinal adenopathy. Pulmonary function testing in April 2024 indicated moderate obstruction with a slight bronchodilator response. A chest x-ray in March 2024 showed chronic lung changes without acute disease.  She increased her prednisone dosage during flares and occasionally required antibiotics for bronchitis. She was previously on methotrexate but discontinued it due to adverse effects. She does not use albuterol as it was ineffective. She is on long-term prednisone, currently at 10 mg daily, and has recently started Zepbound for weight loss, with a dose increase to 5 mg.  She has gained approximately 50 pounds recently and is currently on Zepbound 5 mg for weight loss, covered through a prediabetes program.  No current chest pain. Persistent shortness of breath. Uncertain if she received a flu shot this past season.      Allergies  Allergen Reactions   Hydrocodone Nausea And Vomiting    Immunization History  Administered Date(s) Administered   DTaP 06/06/2022   Influenza Inj Mdck Quad With Preservative 06/20/2023   Influenza Split 06/12/2012   Influenza Whole 05/04/2011   Influenza,inj,Quad PF,6+ Mos 07/16/2013, 06/16/2014, 07/03/2018, 05/31/2019, 08/04/2020, 05/19/2022   Influenza-Unspecified  08/04/2020, 08/10/2021   PFIZER(Purple Top)SARS-COV-2 Vaccination 11/27/2019, 12/18/2019, 08/20/2020, 03/11/2021, 06/06/2022   PNEUMOCOCCAL CONJUGATE-20 12/01/2023   Pfizer Covid-19 Theatre manager  26yrs & up 08/10/2021   Pneumococcal Polysaccharide-23 05/04/2011   Pneumococcal-Unspecified 08/05/2018   Tdap 03/07/2012   Unspecified SARS-COV-2 Vaccination 11/27/2019, 12/18/2019, 08/20/2020   Zoster Recombinant(Shingrix) 08/10/2021    Past Medical History:  Diagnosis Date   Abnormal chest x-ray    Anemia    Aortic atherosclerosis (HCC)    Hemoptysis    Pulmonary hypertension (HCC)    Sarcoid    bronch negative 09-2008 ACE 93   Tachycardia    Unspecified asthma(493.90)     Tobacco History: Social History   Tobacco Use  Smoking Status Former   Current packs/day: 0.00   Average packs/day: 0.3 packs/day for 10.0 years (2.5 ttl pk-yrs)   Types: Cigarettes   Start date: 2001   Quit date: 08/05/2008   Years since quitting: 15.3   Passive exposure: Past  Smokeless Tobacco Never   Counseling given: Not Answered   Outpatient Medications Prior to Visit  Medication Sig Dispense Refill   atorvastatin (LIPITOR) 20 MG tablet Take 1 tablet (20 mg total) by mouth daily. 90 tablet 3   Cholecalciferol (VITAMIN D3) 50 MCG (2000 UT) TABS Take 50 mcg by mouth daily at 12 noon.     iron polysaccharides (NIFEREX) 150 MG capsule TAKE 1 CAPSULE(150 MG) BY MOUTH DAILY 90 capsule 3   metFORMIN (GLUCOPHAGE-XR) 500 MG 24 hr tablet Take 1 tablet (500 mg total) by mouth daily with breakfast. 90 tablet 3   predniSONE (DELTASONE) 5 MG tablet 10 mg daily. Increase to 20 mg daily for hemoptysis. 200 tablet 1   venlafaxine XR (EFFEXOR-XR) 75 MG 24 hr capsule Take 1 capsule (75 mg total) by mouth daily with breakfast. 90 capsule 3   ZEPBOUND 2.5 MG/0.5ML Pen      No facility-administered medications prior to visit.   Review of Systems  Review of Systems  Constitutional: Negative.   HENT:  Negative.    Respiratory: Negative.    Cardiovascular: Negative.    Physical Exam  There were no vitals taken for this visit. Physical Exam Constitutional:      Appearance: Normal appearance.  HENT:     Head: Normocephalic and atraumatic.  Cardiovascular:     Rate and Rhythm: Normal rate and regular rhythm.     Heart sounds: No murmur heard. Pulmonary:     Effort: Pulmonary effort is normal.     Breath sounds: Normal breath sounds. No wheezing, rhonchi or rales.  Musculoskeletal:        General: Normal range of motion.  Skin:    General: Skin is warm and dry.  Neurological:     General: No focal deficit present.     Mental Status: She is alert and oriented to person, place, and time. Mental status is at baseline.  Psychiatric:        Mood and Affect: Mood normal.        Behavior: Behavior normal.        Thought Content: Thought content normal.        Judgment: Judgment normal.      Lab Results:  CBC    Component Value Date/Time   WBC 3.9 (L) 06/20/2023 0834   RBC 4.60 06/20/2023 0834   HGB 12.6 06/20/2023 0834   HGB 14.0 09/14/2022 1019   HCT 39.9 06/20/2023 0834   HCT 43.9 09/14/2022 1019   PLT 170.0 06/20/2023 0834   PLT 192 09/14/2022 1019   MCV 86.7 06/20/2023 0834   MCV 89 09/14/2022 1019   MCH 28.5 09/25/2022 0700  MCHC 31.5 06/20/2023 0834   RDW 14.2 06/20/2023 0834   RDW 12.1 09/14/2022 1019   LYMPHSABS 1.5 06/20/2023 0834   LYMPHSABS 1.8 09/14/2022 1019   MONOABS 0.5 06/20/2023 0834   EOSABS 0.1 06/20/2023 0834   EOSABS 0.1 09/14/2022 1019   BASOSABS 0.0 06/20/2023 0834   BASOSABS 0.0 09/14/2022 1019    BMET    Component Value Date/Time   NA 136 06/20/2023 0834   NA 137 09/14/2022 1019   K 4.3 06/20/2023 0834   CL 100 06/20/2023 0834   CO2 30 06/20/2023 0834   GLUCOSE 84 06/20/2023 0834   BUN 11 06/20/2023 0834   BUN 10 09/14/2022 1019   CREATININE 0.74 06/20/2023 0834   CREATININE 0.96 10/15/2020 1333   CALCIUM 9.5 06/20/2023 0834    GFRNONAA >60 09/25/2022 0700   GFRNONAA 69 10/15/2020 1333   GFRAA 80 10/15/2020 1333    BNP No results found for: "BNP"  ProBNP No results found for: "PROBNP"  Imaging: No results found.   Assessment & Plan:   1. Current chronic use of systemic steroids (Primary)  2. 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 Chronically elevated ACE levels, though last level in April 2023 was normal. Moderate obstruction on pulmonary function testing in April 2024 with slight bronchodilator response. Recent exacerbations with upper respiratory infections and hemoptysis, last in February. Currently on prednisone 10 mg daily; methotrexate previously caused adverse effects. Albuterol ineffective for symptom relief. Considering [facility] for further management as methotrexate was not well tolerated. - Order chest x-ray and ACE level - Consider follow-up CT scan if abnormalities are found - Discuss potential referral to [facility] for further management - Continue prednisone 10 mg daily  Weight Gain Recent weight gain of 50 pounds, likely exacerbated by prednisone. Currently on Zepbound for weight loss, recently increased to 5 mg. Insurance coverage through a prediabetes program. - Continue Zepbound 5 mg for weight loss  Glenford Bayley, NP 12/05/2023

## 2023-12-08 ENCOUNTER — Other Ambulatory Visit (INDEPENDENT_AMBULATORY_CARE_PROVIDER_SITE_OTHER)

## 2023-12-08 DIAGNOSIS — D869 Sarcoidosis, unspecified: Secondary | ICD-10-CM

## 2023-12-08 DIAGNOSIS — Z1231 Encounter for screening mammogram for malignant neoplasm of breast: Secondary | ICD-10-CM

## 2023-12-08 LAB — COMPREHENSIVE METABOLIC PANEL WITH GFR
ALT: 9 U/L (ref 0–35)
AST: 18 U/L (ref 0–37)
Albumin: 4.2 g/dL (ref 3.5–5.2)
Alkaline Phosphatase: 46 U/L (ref 39–117)
BUN: 12 mg/dL (ref 6–23)
CO2: 27 meq/L (ref 19–32)
Calcium: 9.7 mg/dL (ref 8.4–10.5)
Chloride: 102 meq/L (ref 96–112)
Creatinine, Ser: 0.77 mg/dL (ref 0.40–1.20)
GFR: 87.68 mL/min (ref >60.00)
Glucose, Bld: 78 mg/dL (ref 70–99)
Potassium: 4.3 meq/L (ref 3.5–5.1)
Sodium: 137 meq/L (ref 135–145)
Total Bilirubin: 0.5 mg/dL (ref 0.2–1.2)
Total Protein: 8 g/dL (ref 6.0–8.3)

## 2023-12-08 LAB — CBC WITH DIFFERENTIAL/PLATELET
Basophils Absolute: 0 10*3/uL (ref 0.0–0.1)
Basophils Relative: 0.7 % (ref 0.0–3.0)
Eosinophils Absolute: 0.1 10*3/uL (ref 0.0–0.7)
Eosinophils Relative: 3.3 % (ref 0.0–5.0)
HCT: 38.1 % (ref 36.0–46.0)
Hemoglobin: 12.2 g/dL (ref 12.0–15.0)
Lymphocytes Relative: 53.3 % — ABNORMAL HIGH (ref 12.0–46.0)
Lymphs Abs: 1.5 10*3/uL (ref 0.7–4.0)
MCHC: 32.2 g/dL (ref 30.0–36.0)
MCV: 87.1 fl (ref 78.0–100.0)
Monocytes Absolute: 0.5 10*3/uL (ref 0.1–1.0)
Monocytes Relative: 17.9 % — ABNORMAL HIGH (ref 3.0–12.0)
Neutro Abs: 0.7 10*3/uL — ABNORMAL LOW (ref 1.4–7.7)
Neutrophils Relative %: 24.8 % — ABNORMAL LOW (ref 43.0–77.0)
Platelets: 156 10*3/uL (ref 150.0–400.0)
RBC: 4.37 Mil/uL (ref 3.87–5.11)
RDW: 12.6 % (ref 11.5–15.5)
WBC: 2.9 10*3/uL — ABNORMAL LOW (ref 4.0–10.5)

## 2023-12-11 ENCOUNTER — Encounter: Payer: Self-pay | Admitting: Family

## 2023-12-11 ENCOUNTER — Other Ambulatory Visit: Payer: Self-pay | Admitting: Family

## 2023-12-11 DIAGNOSIS — R7989 Other specified abnormal findings of blood chemistry: Secondary | ICD-10-CM

## 2023-12-11 LAB — ANGIOTENSIN CONVERTING ENZYME: Angiotensin-Converting Enzyme: 69 U/L — ABNORMAL HIGH (ref 9–67)

## 2023-12-12 ENCOUNTER — Encounter: Payer: Self-pay | Admitting: Internal Medicine

## 2023-12-14 ENCOUNTER — Other Ambulatory Visit: Payer: Self-pay | Admitting: Family

## 2023-12-14 ENCOUNTER — Telehealth: Payer: Self-pay | Admitting: Internal Medicine

## 2023-12-14 NOTE — Telephone Encounter (Signed)
 Disability forms have been signed and faxed successfully to [facility] 12/14/23.

## 2023-12-25 ENCOUNTER — Ambulatory Visit
Admission: RE | Admit: 2023-12-25 | Discharge: 2023-12-25 | Disposition: A | Discharge status: Home / Self Care | Source: Ambulatory Visit | Attending: Family | Admitting: Family

## 2023-12-25 DIAGNOSIS — Z1231 Encounter for screening mammogram for malignant neoplasm of breast: Secondary | ICD-10-CM | POA: Diagnosis not present

## 2023-12-29 ENCOUNTER — Other Ambulatory Visit: Payer: Self-pay | Admitting: Family

## 2023-12-29 DIAGNOSIS — R7303 Prediabetes: Secondary | ICD-10-CM | POA: Diagnosis not present

## 2023-12-29 DIAGNOSIS — R928 Other abnormal and inconclusive findings on diagnostic imaging of breast: Secondary | ICD-10-CM

## 2023-12-30 DIAGNOSIS — Z1211 Encounter for screening for malignant neoplasm of colon: Secondary | ICD-10-CM | POA: Diagnosis not present

## 2024-01-03 DIAGNOSIS — H53143 Visual discomfort, bilateral: Secondary | ICD-10-CM | POA: Diagnosis not present

## 2024-01-03 DIAGNOSIS — E119 Type 2 diabetes mellitus without complications: Secondary | ICD-10-CM | POA: Diagnosis not present

## 2024-01-03 LAB — HM DIABETES EYE EXAM

## 2024-01-05 LAB — COLOGUARD: COLOGUARD: NEGATIVE

## 2024-01-09 ENCOUNTER — Ambulatory Visit

## 2024-01-09 ENCOUNTER — Ambulatory Visit
Admission: RE | Admit: 2024-01-09 | Discharge: 2024-01-09 | Disposition: A | Discharge status: Home / Self Care | Source: Ambulatory Visit | Attending: Family | Admitting: Family

## 2024-01-09 DIAGNOSIS — R928 Other abnormal and inconclusive findings on diagnostic imaging of breast: Secondary | ICD-10-CM

## 2024-01-15 NOTE — Telephone Encounter (Signed)
 Received forms in blue folder.  Placed on Dr. Antonette Batters desk in [facility].

## 2024-01-15 NOTE — Telephone Encounter (Signed)
 [employer] sent back a fax. They need updated information to support Long-Term Disability claim. Forms will be placed in folder in Dr. Antonette Batters box.

## 2024-01-17 DIAGNOSIS — I272 Pulmonary hypertension, unspecified: Secondary | ICD-10-CM | POA: Diagnosis not present

## 2024-01-17 DIAGNOSIS — D860 Sarcoidosis of lung: Secondary | ICD-10-CM | POA: Diagnosis not present

## 2024-01-17 DIAGNOSIS — D649 Anemia, unspecified: Secondary | ICD-10-CM | POA: Diagnosis not present

## 2024-01-17 DIAGNOSIS — R918 Other nonspecific abnormal finding of lung field: Secondary | ICD-10-CM | POA: Diagnosis not present

## 2024-01-17 DIAGNOSIS — R042 Hemoptysis: Secondary | ICD-10-CM | POA: Diagnosis not present

## 2024-01-17 DIAGNOSIS — Z87891 Personal history of nicotine dependence: Secondary | ICD-10-CM | POA: Diagnosis not present

## 2024-01-18 DIAGNOSIS — R0602 Shortness of breath: Secondary | ICD-10-CM | POA: Diagnosis not present

## 2024-01-23 ENCOUNTER — Emergency Department (EMERGENCY_DEPARTMENT_HOSPITAL)

## 2024-01-23 ENCOUNTER — Other Ambulatory Visit: Payer: Self-pay

## 2024-01-23 ENCOUNTER — Encounter (HOSPITAL_COMMUNITY): Payer: Self-pay

## 2024-01-23 ENCOUNTER — Telehealth: Payer: Self-pay

## 2024-01-23 ENCOUNTER — Emergency Department (HOSPITAL_COMMUNITY)

## 2024-01-23 ENCOUNTER — Ambulatory Visit: Admitting: Family

## 2024-01-23 ENCOUNTER — Emergency Department (HOSPITAL_COMMUNITY)
Admission: EM | Admit: 2024-01-23 | Discharge: 2024-01-23 | Disposition: A | Discharge status: Home / Self Care | Attending: Emergency Medicine | Admitting: Emergency Medicine

## 2024-01-23 DIAGNOSIS — R079 Chest pain, unspecified: Secondary | ICD-10-CM

## 2024-01-23 DIAGNOSIS — R109 Unspecified abdominal pain: Secondary | ICD-10-CM

## 2024-01-23 DIAGNOSIS — R070 Pain in throat: Secondary | ICD-10-CM | POA: Diagnosis not present

## 2024-01-23 DIAGNOSIS — R1084 Generalized abdominal pain: Secondary | ICD-10-CM | POA: Diagnosis not present

## 2024-01-23 DIAGNOSIS — R609 Edema, unspecified: Secondary | ICD-10-CM

## 2024-01-23 DIAGNOSIS — I2699 Other pulmonary embolism without acute cor pulmonale: Secondary | ICD-10-CM | POA: Diagnosis not present

## 2024-01-23 DIAGNOSIS — R197 Diarrhea, unspecified: Secondary | ICD-10-CM | POA: Diagnosis not present

## 2024-01-23 DIAGNOSIS — Z7984 Long term (current) use of oral hypoglycemic drugs: Secondary | ICD-10-CM | POA: Diagnosis not present

## 2024-01-23 DIAGNOSIS — R112 Nausea with vomiting, unspecified: Secondary | ICD-10-CM | POA: Diagnosis present

## 2024-01-23 DIAGNOSIS — K8020 Calculus of gallbladder without cholecystitis without obstruction: Secondary | ICD-10-CM | POA: Diagnosis not present

## 2024-01-23 DIAGNOSIS — R1111 Vomiting without nausea: Secondary | ICD-10-CM | POA: Diagnosis not present

## 2024-01-23 DIAGNOSIS — R042 Hemoptysis: Secondary | ICD-10-CM | POA: Diagnosis not present

## 2024-01-23 DIAGNOSIS — D869 Sarcoidosis, unspecified: Secondary | ICD-10-CM | POA: Diagnosis not present

## 2024-01-23 DIAGNOSIS — R590 Localized enlarged lymph nodes: Secondary | ICD-10-CM | POA: Diagnosis not present

## 2024-01-23 DIAGNOSIS — R0789 Other chest pain: Secondary | ICD-10-CM | POA: Diagnosis not present

## 2024-01-23 LAB — CBC WITH DIFFERENTIAL/PLATELET
Abs Immature Granulocytes: 0 10*3/uL (ref 0.00–0.07)
Basophils Absolute: 0 10*3/uL (ref 0.0–0.1)
Basophils Relative: 0 %
Eosinophils Absolute: 0 10*3/uL (ref 0.0–0.5)
Eosinophils Relative: 0 %
HCT: 38.4 % (ref 36.0–46.0)
Hemoglobin: 12 g/dL (ref 12.0–15.0)
Immature Granulocytes: 0 %
Lymphocytes Relative: 29 %
Lymphs Abs: 0.8 10*3/uL (ref 0.7–4.0)
MCH: 27.2 pg (ref 26.0–34.0)
MCHC: 31.3 g/dL (ref 30.0–36.0)
MCV: 87.1 fL (ref 80.0–100.0)
Monocytes Absolute: 0.3 10*3/uL (ref 0.1–1.0)
Monocytes Relative: 12 %
Neutro Abs: 1.7 10*3/uL (ref 1.7–7.7)
Neutrophils Relative %: 59 %
Platelets: 156 10*3/uL (ref 150–400)
RBC: 4.41 MIL/uL (ref 3.87–5.11)
RDW: 13 % (ref 11.5–15.5)
WBC: 2.8 10*3/uL — ABNORMAL LOW (ref 4.0–10.5)
nRBC: 0 % (ref 0.0–0.2)

## 2024-01-23 LAB — COMPREHENSIVE METABOLIC PANEL WITH GFR
ALT: 11 U/L (ref 0–44)
AST: 18 U/L (ref 15–41)
Albumin: 3.5 g/dL (ref 3.5–5.0)
Alkaline Phosphatase: 45 U/L (ref 38–126)
Anion gap: 7 (ref 5–15)
BUN: 10 mg/dL (ref 6–20)
CO2: 23 mmol/L (ref 22–32)
Calcium: 8 mg/dL — ABNORMAL LOW (ref 8.9–10.3)
Chloride: 105 mmol/L (ref 98–111)
Creatinine, Ser: 0.57 mg/dL (ref 0.44–1.00)
GFR, Estimated: >60 mL/min (ref >60)
Glucose, Bld: 85 mg/dL (ref 70–99)
Potassium: 3.4 mmol/L — ABNORMAL LOW (ref 3.5–5.1)
Sodium: 135 mmol/L (ref 135–145)
Total Bilirubin: 0.7 mg/dL (ref 0.0–1.2)
Total Protein: 7.8 g/dL (ref 6.5–8.1)

## 2024-01-23 LAB — URINALYSIS, ROUTINE W REFLEX MICROSCOPIC
Bilirubin Urine: NEGATIVE
Glucose, UA: NEGATIVE mg/dL
Hgb urine dipstick: NEGATIVE
Ketones, ur: NEGATIVE mg/dL
Leukocytes,Ua: NEGATIVE
Nitrite: NEGATIVE
Protein, ur: NEGATIVE mg/dL
Specific Gravity, Urine: <1.005 — ABNORMAL LOW (ref 1.005–1.030)
pH: 7 (ref 5.0–8.0)

## 2024-01-23 LAB — MAGNESIUM: Magnesium: 1.7 mg/dL (ref 1.7–2.4)

## 2024-01-23 LAB — TYPE AND SCREEN
ABO/RH(D): B NEG
Antibody Screen: NEGATIVE

## 2024-01-23 LAB — LIPASE, BLOOD: Lipase: 30 U/L (ref 11–51)

## 2024-01-23 LAB — TROPONIN I (HIGH SENSITIVITY): Troponin I (High Sensitivity): <2 ng/L (ref <18)

## 2024-01-23 MED ORDER — KETOROLAC TROMETHAMINE 30 MG/ML IJ SOLN
30.0000 mg | Freq: Once | INTRAMUSCULAR | Status: AC
  Administered 2024-01-23: 30 mg via INTRAVENOUS
  Filled 2024-01-23: qty 1

## 2024-01-23 MED ORDER — ONDANSETRON HCL 4 MG/2ML IJ SOLN
4.0000 mg | Freq: Once | INTRAMUSCULAR | Status: AC
  Administered 2024-01-23: 4 mg via INTRAVENOUS
  Filled 2024-01-23: qty 2

## 2024-01-23 MED ORDER — ONDANSETRON 4 MG PO TBDP: 4.0000 mg | ORAL_TABLET | Freq: Three times a day (TID) | ORAL | 0 refills | Status: AC | PRN

## 2024-01-23 MED ORDER — PANTOPRAZOLE SODIUM 40 MG IV SOLR
40.0000 mg | Freq: Once | INTRAVENOUS | Status: AC
  Administered 2024-01-23: 40 mg via INTRAVENOUS
  Filled 2024-01-23: qty 10

## 2024-01-23 MED ORDER — POTASSIUM CHLORIDE CRYS ER 20 MEQ PO TBCR
40.0000 meq | EXTENDED_RELEASE_TABLET | Freq: Once | ORAL | Status: AC
  Administered 2024-01-23: 40 meq via ORAL
  Filled 2024-01-23: qty 2

## 2024-01-23 MED ORDER — PREDNISONE 10 MG PO TABS: ORAL_TABLET | ORAL | 0 refills | Status: AC

## 2024-01-23 MED ORDER — IBUPROFEN 200 MG PO TABS
600.0000 mg | ORAL_TABLET | Freq: Once | ORAL | Status: AC
  Administered 2024-01-23: 600 mg via ORAL
  Filled 2024-01-23: qty 3

## 2024-01-23 MED ORDER — IOHEXOL 350 MG/ML SOLN
100.0000 mL | Freq: Once | INTRAVENOUS | Status: AC | PRN
  Administered 2024-01-23: 100 mL via INTRAVENOUS

## 2024-01-23 NOTE — Telephone Encounter (Signed)
 Initial Comment She has nausea, vomiting, diarrhea, coughing up blood, stomach and chest pains Translation No Nurse Assessment Nurse: Self, RN, Heather Date/Time (Eastern Time): 01/23/2024 7:48:17 AM Confirm and document reason for call. If symptomatic, describe symptoms. ---Caller says V/D, coughing up blood , CP since Sunday . Does the patient have any new or worsening symptoms? ---Yes Will a triage be completed? ---Yes Related visit to physician within the last 2 weeks? ---No Does the PT have any chronic conditions? (i.e. diabetes, asthma, this includes High risk factors for pregnancy, etc.) ---Yes List chronic conditions. ---Sarcoidosis Is the patient pregnant or possibly pregnant? (Ask all females between the ages of 26-55) ---No Is this a behavioral health or substance abuse call? ---No Guidelines Guideline Title Affirmed Question Affirmed Notes Nurse Date/Time (Eastern Time) Chest Pain [1] Chest pain lasts > 5 minutes AND [2] age > 35 Self, RN, Heather 01/23/2024 7:49:20 AM Disp. Time [facility] Time) Disposition Final User 01/23/2024 7:46:20 AM Send to Urgent Queue Flordia Hung 01/23/2024 7:50:53 AM Call EMS 911 Now Yes Self, RN, Pattie Borders PLEASE NOTE: All timestamps contained within this report are represented as [place] Standard Time. CONFIDENTIALTY NOTICE: This fax transmission is intended only for the addressee. It contains information that is legally privileged, confidential or otherwise protected from use or disclosure. If you are not the intended recipient, you are strictly prohibited from reviewing, disclosing, copying using or disseminating any of this information or taking any action in reliance on or regarding this information. If you have received this fax in error, please notify us  immediately by telephone so that we can arrange for its return to us . Phone: [phone], Toll-Free: [phone], Fax: [phone] [facility] 1971-01-27 Page: 2 of 2 CallId:  [id number] Disp. Time [facility] Time) Disposition Final User 01/23/2024 7:58:59 AM 911 Outcome Documentation Self, RN, Pattie Borders Reason: EMS is in route Final Disposition 01/23/2024 7:50:53 AM Call EMS 911 Now Yes Self, RN, Pattie Borders Caller Disagree/Comply Comply Caller Understands Yes PreDisposition Call Doctor Care Advice Given Per Guideline CALL EMS 911 NOW: * Immediate medical attention is needed. You need to hang up and call 911 (or an ambulance). * Triager Discretion: I'll call you back in a few minutes to be sure you were able to reach them. CARE ADVICE given per Chest Pain (Adult) guideline. Referrals [facility] - ED

## 2024-01-23 NOTE — ED Provider Notes (Signed)
 Received patient from previous provider pending DVT study, completion of ED workup.  See his note.  In short, patient presents emergency department for evaluation of NVD for the past 2 days.  She also endorses worsening hemoptysis and slight chest discomfort.  Was provided Zofran  and Protonix for symptoms.  ED workup notable for potassium 3.4, calcium  8.  CT of abd and CTA of chest notable for  Nonopacification of right upper lobe pulmonary artery branches. Findings may represent pulmonary artery embolus or in situ thrombus In the setting of advanced pulmonary sarcoid, of uncertain acuity No acute intraabdominal pathology  Pulmonology was consulted regarding CTA showing sarcoidosis versus PE.  Pulmonology reports that they believe this is her plan as remodeling of RUL rather than clot.  However, they recommended LE duplex to ensure no DVT.  Fortunately, this is negative.  At this time, no AC is recommended as she is having hemoptysis and worsening likely secondary to prednisone . Recommend increasing prednisone  60 mg per day for the next week then taper by 10 mg/day/week until after 30 mg/day and follow-up with [facility] Plan to continue with PET on Friday and pulmonology follow-up at [facility] as scheduled in five days. Ensure patient can tolerate p.o. as she needs to takes steroids for sarcoid  Patient passed p.o. challenge able to keep both liquids and solids down.  No complaints of nausea.  At this time, feel the patient is safe for discharge and follow-up with pulmonology as scheduled.  Discussed ED workup, disposition, return to ED precautions with patient who expresses understanding agrees with plan.  All questions answered to their satisfaction.  They are agreeable to plan.  Discharge instructions provided on paperwork   Royann Cords, PA 01/23/24 1748    Lind Repine, MD 01/24/24 [phone]

## 2024-01-23 NOTE — ED Triage Notes (Signed)
 Pt. BIB ems for nausea, vomiting, diarrhea for the past 2 days. Decreased PO intake in the past couple of days because nothing stays in the stomach. Solids are not tolerated well by the stomach. Pt. States coughing up blood but also has sarcoidosis she states that coughing up blood is normal for her when she has flare ups sometimes.  Given 500ml of NS

## 2024-01-23 NOTE — ED Notes (Signed)
 Pt noted to walk to restroom and back w/o assistance.  NAD noted.

## 2024-01-23 NOTE — ED Provider Notes (Signed)
 [place] EMERGENCY DEPARTMENT AT [facility] Provider Note   CSN: [id number] Arrival date & time: 01/23/24  2841     History  Chief Complaint  Patient presents with   Nausea   Diarrhea   Emesis    Nancy Villarreal is a 53 y.o. female history of sarcoidosis, hemoptysis, pulmonary hypertension presented for 2 days of nausea vomiting diarrhea.  Patient denies any sick contacts, Nancy foods, travel, Nancy antibiotics, fevers.  Patient dates she cannot keep anything down.  Patient states she normally does have hemoptysis however this feels worse.  Patient states she also has some slight chest discomfort as well with her symptoms.  Patient denies any melena or blood in her stool.  Home Medications Prior to Admission medications   Medication Sig Start Date End Date Taking? Authorizing Provider  atorvastatin  (LIPITOR) 20 MG tablet Take 1 tablet (20 mg total) by mouth daily. 09/27/22   Avanell Leigh, MD  Cholecalciferol (VITAMIN D3) 50 MCG (2000 UT) TABS Take 50 mcg by mouth daily at 12 noon.    [provider]  iron  polysaccharides (NIFEREX) 150 MG capsule TAKE 1 CAPSULE(150 MG) BY MOUTH DAILY 12/01/23   Adra Alanis, FNP  metFORMIN  (GLUCOPHAGE -XR) 500 MG 24 hr tablet Take 1 tablet (500 mg total) by mouth daily with breakfast. 12/01/23   Adra Alanis, FNP  predniSONE  (DELTASONE ) 5 MG tablet 10 mg daily. Increase to 20 mg daily for hemoptysis. 03/13/23   Faustina Hood, MD  venlafaxine  XR (EFFEXOR -XR) 75 MG 24 hr capsule Take 1 capsule (75 mg total) by mouth daily with breakfast. 12/01/23   Adra Alanis, FNP  ZEPBOUND 2.5 MG/0.5ML Pen  11/09/23   [provider]      Allergies    Hydrocodone     Review of Systems   Review of Systems  Gastrointestinal:  Positive for diarrhea and vomiting.    Physical Exam Updated Vital Signs BP 120/75   Pulse 86   Temp 99.1 F (37.3 C) (Oral)   Resp 12   SpO2 98%  Physical Exam Vitals  reviewed.  Constitutional:      General: She is not in acute distress. HENT:     Head: Normocephalic and atraumatic.  Eyes:     Extraocular Movements: Extraocular movements intact.     Conjunctiva/sclera: Conjunctivae normal.     Pupils: Pupils are equal, round, and reactive to light.  Cardiovascular:     Rate and Rhythm: Normal rate and regular rhythm.     Pulses: Normal pulses.     Heart sounds: Normal heart sounds.     Comments: 2+ bilateral radial/dorsalis pedis pulses with regular rate Pulmonary:     Effort: Pulmonary effort is normal. No respiratory distress.     Breath sounds: Normal breath sounds.  Abdominal:     Palpations: Abdomen is soft.     Tenderness: There is abdominal tenderness (Generalized). There is no guarding or rebound.  Musculoskeletal:        General: Normal range of motion.     Cervical back: Normal range of motion and neck supple.     Comments: 5 out of 5 bilateral grip/leg extension strength  Skin:    General: Skin is warm and dry.     Capillary Refill: Capillary refill takes less than 2 seconds.  Neurological:     General: No focal deficit present.     Mental Status: She is alert and oriented to person, place, and time.  Comments: Sensation intact in all 4 limbs  Psychiatric:        Mood and Affect: Mood normal.    ED Results / Procedures / Treatments   Labs (all labs ordered are listed, but only abnormal results are displayed) Labs Reviewed  CBC WITH DIFFERENTIAL/PLATELET  COMPREHENSIVE METABOLIC PANEL WITH GFR  LIPASE, BLOOD  URINALYSIS, ROUTINE W REFLEX MICROSCOPIC  MAGNESIUM  TYPE AND SCREEN  TROPONIN I (HIGH SENSITIVITY)    EKG EKG Interpretation Date/Time:  Tuesday Jan 23 2024 08:57:45 EDT Ventricular Rate:  90 PR Interval:  130 QRS Duration:  81 QT Interval:  455 QTC Calculation: 557 R Axis:   71  Text Interpretation: Sinus rhythm Nonspecific T abnormalities, diffuse leads Prolonged QT interval Confirmed by Jerald Molly [phone]) on 01/23/2024 8:59:44 AM  Radiology No results found.  Procedures Procedures    Medications Ordered in ED Medications  ondansetron  (ZOFRAN ) injection 4 mg (4 mg Intravenous Given 01/23/24 0934)  pantoprazole (PROTONIX) injection 40 mg (40 mg Intravenous Given 01/23/24 8295)    ED Course/ Medical Decision Making/ A&P                                 Medical Decision Making Amount and/or Complexity of Data Reviewed Labs: ordered. Radiology: ordered.  Risk Prescription drug management.   Nancy Villarreal 53 y.o. presented today for nausea vomiting diarrhea hemoptysis. Working DDx that I considered at this time includes, but not limited to, viral illness, esophageal rupture, PE, sarcoid,.  R/o DDx: pending  Review of prior external notes: 01/18/2024 outpatient visit  Unique Tests and My Independent Interpretation:  CBC: Unremarkable CMP: Unremarkable Lipase: Unremarkable Type and screen: B- Magnesium: Unremarkable Troponin: Less than 2 UA: Unremarkable CTA chest: PE versus advanced sarcoid CT ab pelvis contrast: No acute pathology DVT study: pending  Social Determinants of Health: none  Discussion with Independent Historian: Husband  Discussion of Management of Tests: Nancy Kos, MD Pulm  Risk: Medium: prescription drug management  Risk Stratification Score: None  Plan: On exam patient was no acute distress with stable vitals.  Patient's exam does show generalized abdominal tenderness without peritoneal signs.  Patient is overall well-appearing however given her history of sarcoid along with hemoptysis we will get CT imaging of the chest and abdomen.  Labs will also be drawn.  Patient given Zofran  Protonix and improved with this medication.  Patient's labs are ultimately reassuring.  CTA shows either advanced pulmonary sarcoidosis versus PE.  For troponin is negative will get second 1.  Patient is not currently hypoxic nor tachypneic here  tachycardic but given CT read will consult pulmonology.  Pulmonology evaluated patient states that patient has positive duplex she will need IVC however if negative she can go home but will need to be p.o. challenge before discharge as she will need to take her prednisone  at home.  Pulmonology recommends 60mg  prednisone  a day for a week then 50 a day for a week back down to 30mg .  Patient signed out to Nancy Villarreal, Nancy Villarreal.  Please review their note for the continuation of patient's care.  The plan at this point is follow-up on DVT study.  If positive patient will need IVC filter.  If negative can discharge with prednisone  and pulmonology follow-up as listed above.  This chart was dictated using voice recognition software.  Despite best efforts to proofread,  errors can occur which can change the documentation meaning.  Final Clinical Impression(s) / ED Diagnoses Final diagnoses:  None    Rx / DC Orders ED Discharge Orders     None         Elex Grimmer 01/23/24 1448    Arvilla Birmingham, MD 01/23/24 [id number]

## 2024-01-23 NOTE — Discharge Instructions (Addendum)
 Thank you for letting us  evaluate you today.  Your CT scan of your chest was not fully definitive between sarcoidosis versus PE.  Therefore, we did a DVT ultrasound of the legs.  Fortunately, there is no clot in your legs.  With your hemoptysis at baseline, we do not feel that anticoagulation is necessary.  Please follow-up with pulmonology as scheduled.  I have sent your increased prednisone  taper as discussed to your pharmacy. Take as directed starting tomorrow, once these are completed you may go back to 30 mg daily.  Have also sent Zofran  to pharmacy for nausea  Return to emergency department experience chest pain, shortness of breath, worsening symptoms, intractable vomiting

## 2024-01-23 NOTE — ED Notes (Signed)
 Pt given saltine crackers x3 and ginger ale.

## 2024-01-23 NOTE — ED Notes (Signed)
 Pt able to tolerate saltine crackers and ginger ale.   EDP at bedside.

## 2024-01-23 NOTE — ED Notes (Signed)
 Pt. Complained of a headache 10/10 pain, Doctor notified. Pain medications given at 11:34

## 2024-01-23 NOTE — Telephone Encounter (Signed)
 Pt in ED.

## 2024-01-23 NOTE — ED Notes (Signed)
 Patient transported to CT

## 2024-01-23 NOTE — Consult Note (Signed)
 NAME:  Nancy Villarreal, MRN:  [id number], DOB:  [birth year], LOS: 0 ADMISSION DATE:  01/23/2024, CONSULTATION DATE:  01/23/24 REFERRING MD:  EDP, CHIEF COMPLAINT:  abnormal CT chest   History of Present Illness:  53 year old woman with longstanding sarcoid manifesting as hemoptysis and DOE who is presenting with a couple days of nausea, vomiting and diarrhea after eating chicken livers.  Other family members that ate this did not have similar issues.   She does note her hemoptysis is more frequent than usual since starting a steroid taper from Dr. McQuaid in anticipation for DMARD initiation after PET later this week.  CTA done and shows some abnormalities around RUL pulmonary artery for which PCCM is consulted.  No recent car trips, no chest pains, no worsening DOE, no presyncopal symptoms.  Has not been able to keep down her steroids for past several days due to nausea.  Pertinent  Medical History   Past Medical History:  Diagnosis Date   Abnormal chest x-ray    Anemia    Aortic atherosclerosis (HCC)    Hemoptysis    Pulmonary hypertension (HCC)    Sarcoid    bronch negative 09-2008 ACE 93   Tachycardia    Unspecified asthma(493.90)      Significant Hospital Events: Including procedures, antibiotic start and stop dates in addition to other pertinent events   5/20 consult  Interim History / Subjective:  seen  Objective    Blood pressure 110/79, pulse 88, temperature 98.7 F (37.1 C), temperature source Oral, resp. rate 15, height 5\' 3"  (1.6 m), weight 81.6 kg, SpO2 100%.       No intake or output data in the 24 hours ending 01/23/24 1227 Filed Weights   01/23/24 0939  Weight: 81.6 kg    Examination: General: no distress HENT: MMM, trachea midline Lungs: sounds pretty clear to me, nonlabored breathing pattern on RA Cardiovascular: regular rate, ext warm Abdomen: soft, nontender Extremities: no edema or clubbing Neuro: moves to command Psych: RASS 0  Labs,  imaging reviewed.  Assessment and Plan  Hemoptysis, abnormal CTA- I think this is sarcoid-associated remodeling of RUL rather than clot but tough to tell.  No s/s of VTE otherwise.  Hemoptysis for her is marker of disease activity and likely related to recent steroid taper.  PET should be helpful to see disease activity before DMARD initiation.  If hemoptysis become incessant could consider angio targeting RUL vs. Endobronchial topical cryo but she has been dealing with this for over 10 years. N/V/D- still not able to take PO, concerning if this is her only way to take prednisone   - Check LE duplex, if + would do IVC filter, her degree of hemoptysis and endobronchial sarcoid makes me really hesitate to offer [facility] - Spoke with McQuaid, increase prednisone  60mg /day x 1 week then taper by 10mg /day/week until back to 30mg /day; by that point should have f/u  with McQuaid - PET Friday and pulm f/u at [facility] as scheduled - Plan discussed with EDP - Would hesitate to take home if cannot take PO   Labs   CBC: Recent Labs  Lab 01/23/24 0915  WBC 2.8*  NEUTROABS 1.7  HGB 12.0  HCT 38.4  MCV 87.1  PLT 156    Basic Metabolic Panel: Recent Labs  Lab 01/23/24 0915  NA 135  K 3.4*  CL 105  CO2 23  GLUCOSE 85  BUN 10  CREATININE 0.57  CALCIUM  8.0*  MG 1.7   GFR:  Estimated Creatinine Clearance: 81.3 mL/min (by C-G formula based on SCr of 0.57 mg/dL). Recent Labs  Lab 01/23/24 0915  WBC 2.8*    Liver Function Tests: Recent Labs  Lab 01/23/24 0915  AST 18  ALT 11  ALKPHOS 45  BILITOT 0.7  PROT 7.8  ALBUMIN 3.5   Recent Labs  Lab 01/23/24 0915  LIPASE 30   No results for input(s): "AMMONIA" in the last 168 hours.  ABG No results found for: "PHART", "PCO2ART", "PO2ART", "HCO3", "TCO2", "ACIDBASEDEF", "O2SAT"   Coagulation Profile: No results for input(s): "INR", "PROTIME" in the last 168 hours.  Cardiac Enzymes: No results for input(s): "CKTOTAL", "CKMB", "CKMBINDEX",  "TROPONINI" in the last 168 hours.  HbA1C: Hgb A1c MFr Bld  Date/Time Value Ref Range Status  06/20/2023 08:34 AM 6.2 4.6 - 6.5 % Final    Comment:    Glycemic Control Guidelines for People with Diabetes:Non Diabetic:  <6%Goal of Therapy: <7%Additional Action Suggested:  >8%   09/14/2022 10:19 AM 6.3 (H) 4.8 - 5.6 % Final    Comment:             Prediabetes: 5.7 - 6.4          Diabetes: >6.4          Glycemic control for adults with diabetes: <7.0     CBG: No results for input(s): "GLUCAP" in the last 168 hours.  Review of Systems:    Positive Symptoms in bold:  Constitutional fevers, chills, weight loss, fatigue, anorexia, malaise  Eyes decreased vision, double vision, eye irritation  Ears, Nose, Mouth, Throat sore throat, trouble swallowing, sinus congestion  Cardiovascular chest pain, paroxysmal nocturnal dyspnea, lower ext edema, palpitations   Respiratory SOB, cough, DOE, hemoptysis, wheezing  Gastrointestinal nausea, vomiting, diarrhea  Genitourinary burning with urination, trouble urinating  Musculoskeletal joint aches, joint swelling, back pain  Integumentary  rashes, skin lesions  Neurological focal weakness, focal numbness, trouble speaking, headaches  Psychiatric depression, anxiety, confusion  Endocrine polyuria, polydipsia, cold intolerance, heat intolerance  Hematologic abnormal bruising, abnormal bleeding, unexplained nose bleeds  Allergic/Immunologic recurrent infections, hives, swollen lymph nodes     Past Medical History:  She,  has a past medical history of Abnormal chest x-ray, Anemia, Aortic atherosclerosis (HCC), Hemoptysis, Pulmonary hypertension (HCC), Sarcoid, Tachycardia, and Unspecified asthma(493.90).   Surgical History:   Past Surgical History:  Procedure Laterality Date   BIOPSY  05/06/2022   Procedure: BIOPSY;  Surgeon: Aleck Hurdle, MD;  Location: [facility] ENDOSCOPY;  Service: Pulmonary;;   BRONCHIAL WASHINGS  05/06/2022   Procedure:  BRONCHIAL WASHINGS;  Surgeon: Aleck Hurdle, MD;  Location: [facility] ENDOSCOPY;  Service: Pulmonary;;   BRONCHOSCOPY  09-2008   Negative   CESAREAN SECTION     HEMOSTASIS CONTROL  05/06/2022   Procedure: HEMOSTASIS CONTROL;  Surgeon: Aleck Hurdle, MD;  Location: [facility] ENDOSCOPY;  Service: Pulmonary;;   TUBAL LIGATION     1995   VIDEO BRONCHOSCOPY N/A 05/06/2022   Procedure: VIDEO BRONCHOSCOPY WITHOUT FLUORO;  Surgeon: Aleck Hurdle, MD;  Location: [facility] ENDOSCOPY;  Service: Pulmonary;  Laterality: N/A;     Social History:   reports that she quit smoking about 15 years ago. Her smoking use included cigarettes. She started smoking about 24 years ago. She has a 2.5 pack-year smoking history. She has been exposed to tobacco smoke. She has never used smokeless tobacco. She reports current alcohol use. She reports that she does not use drugs.   Family History:  Her  family history includes Alcohol abuse in her paternal grandmother; Asthma in her paternal grandfather; Cancer in her mother and paternal grandmother; Diabetes in her father; Early death in her paternal grandmother; Healthy in her daughter and son; Hyperlipidemia in her maternal grandmother and mother; Hypertension in her mother; Liver disease in her mother; Lung cancer in her maternal grandfather; Obesity in her mother; Stroke in her maternal grandmother.   Allergies Allergies  Allergen Reactions   Hydrocodone  Nausea And Vomiting     Home Medications  Prior to Admission medications   Medication Sig Start Date End Date Taking? Authorizing Provider  atorvastatin  (LIPITOR) 20 MG tablet Take 1 tablet (20 mg total) by mouth daily. 09/27/22   Avanell Leigh, MD  Cholecalciferol (VITAMIN D3) 50 MCG (2000 UT) TABS Take 50 mcg by mouth daily at 12 noon.    [provider]  iron  polysaccharides (NIFEREX) 150 MG capsule TAKE 1 CAPSULE(150 MG) BY MOUTH DAILY 12/01/23   Adra Alanis, FNP  metFORMIN  (GLUCOPHAGE -XR) 500 MG 24 hr tablet  Take 1 tablet (500 mg total) by mouth daily with breakfast. 12/01/23   Adra Alanis, FNP  predniSONE  (DELTASONE ) 5 MG tablet 10 mg daily. Increase to 20 mg daily for hemoptysis. 03/13/23   Faustina Hood, MD  venlafaxine  XR (EFFEXOR -XR) 75 MG 24 hr capsule Take 1 capsule (75 mg total) by mouth daily with breakfast. 12/01/23   Adra Alanis, FNP  ZEPBOUND 2.5 MG/0.5ML Pen  11/09/23   [provider]     Critical care time: N/A

## 2024-01-23 NOTE — Progress Notes (Signed)
 BLE venous duplex has been completed.  Preliminary results given to Dr. Felipe Horton.   Results can be found under chart review under CV PROC. 01/23/2024 3:22 PM Kambrey Hagger RVT, RDMS

## 2024-01-25 NOTE — Telephone Encounter (Signed)
 Fax signed by Dr. Robby Chime to front w/[employer] and cover letter. Fax'd to [phone], made copy for accordian file, called PT to notify, verify fax went thru.

## 2024-01-26 DIAGNOSIS — R7309 Other abnormal glucose: Secondary | ICD-10-CM | POA: Diagnosis not present

## 2024-01-26 DIAGNOSIS — R918 Other nonspecific abnormal finding of lung field: Secondary | ICD-10-CM | POA: Diagnosis not present

## 2024-01-26 DIAGNOSIS — R042 Hemoptysis: Secondary | ICD-10-CM | POA: Diagnosis not present

## 2024-01-28 DIAGNOSIS — R7303 Prediabetes: Secondary | ICD-10-CM | POA: Diagnosis not present

## 2024-01-31 ENCOUNTER — Telehealth: Payer: Self-pay | Admitting: Cardiovascular Disease

## 2024-01-31 NOTE — Telephone Encounter (Signed)
 Dr. Jacqulyne Maxim from [facility] [employer] was calling regarding disability claim. He's been made aware that MD is on vacation and had made a note that the office will reach back out around returning date. Please advise

## 2024-01-31 NOTE — Telephone Encounter (Signed)
 Spoke with Dr. Jacqulyne Maxim regarding long term disability for pt. Advised that Dr. Katheryne Pane has deferred filling out this paperwork. Per Dr. Katheryne Pane, pt should have PCP or Dr. Linder Revere complete this for her. No further questions at this time.

## 2024-02-02 ENCOUNTER — Telehealth: Payer: Self-pay | Admitting: Internal Medicine

## 2024-02-02 NOTE — Telephone Encounter (Signed)
 Contacted by Dr Gevena Kugel from her disability insurance. He will probably recommend extended or T&P. He is aware she is now seeing Dr Elverna Hamman [facility].

## 2024-02-06 ENCOUNTER — Telehealth: Payer: Self-pay

## 2024-02-06 NOTE — Telephone Encounter (Signed)
 Copied from CRM [id number]. Topic: General - Other >> Feb 06, 2024 10:20 AM Allyne Areola wrote: Reason for CRM: Loetta Ringer from [facility] [employer] is calling to ask for FNP Glade Lambert to give Jacqulyne Maxim md [phone] a call, regarding a disability claim the patient has submitted.

## 2024-02-08 DIAGNOSIS — K449 Diaphragmatic hernia without obstruction or gangrene: Secondary | ICD-10-CM | POA: Diagnosis not present

## 2024-02-08 DIAGNOSIS — I700 Atherosclerosis of aorta: Secondary | ICD-10-CM | POA: Diagnosis not present

## 2024-02-12 ENCOUNTER — Telehealth: Payer: Self-pay | Admitting: Internal Medicine

## 2024-02-12 NOTE — Telephone Encounter (Signed)
 Received in blue folder and given to Dr. Linder Revere.

## 2024-02-12 NOTE — Telephone Encounter (Signed)
 Disability forms were received for review 02/12/24. Repose is needed by June 13th if there are any discrepancies. Forms will be placed in folder in Dr.Young's box for review.

## 2024-02-13 NOTE — Telephone Encounter (Signed)
 Dr. Linder Revere, these forms are due back by 02/16/2024.  Thank you.

## 2024-02-14 NOTE — Telephone Encounter (Signed)
 Forms completed by Dr. Linder Revere and placed back in blue folder.  Paperwork taken back to front desk.    Channing, I will bring these forms up to you. Thanks!

## 2024-02-14 NOTE — Telephone Encounter (Signed)
 Forms were signed and successfully faxed to [facility] at [phone] on June 11,2025.

## 2024-02-28 DIAGNOSIS — D860 Sarcoidosis of lung: Secondary | ICD-10-CM | POA: Diagnosis not present

## 2024-02-28 DIAGNOSIS — E669 Obesity, unspecified: Secondary | ICD-10-CM | POA: Diagnosis not present

## 2024-02-28 DIAGNOSIS — R042 Hemoptysis: Secondary | ICD-10-CM | POA: Diagnosis not present

## 2024-03-29 DIAGNOSIS — E669 Obesity, unspecified: Secondary | ICD-10-CM | POA: Diagnosis not present

## 2024-04-04 ENCOUNTER — Encounter: Payer: Self-pay | Admitting: Family

## 2024-04-04 ENCOUNTER — Ambulatory Visit: Admitting: Family

## 2024-04-04 VITALS — BP 108/78 | HR 88 | Temp 98.2°F | Resp 16 | Ht 63.0 in | Wt 176.2 lb

## 2024-04-04 DIAGNOSIS — M778 Other enthesopathies, not elsewhere classified: Secondary | ICD-10-CM | POA: Diagnosis not present

## 2024-04-04 NOTE — Progress Notes (Signed)
 Nancy Villarreal is a 53 y.o. female with the following history as recorded in EpicCare:  Patient Active Problem List   Diagnosis Date Noted   Insomnia 11/01/2022   Current chronic use of systemic steroids 09/28/2022   Former smoker 09/28/2022   Prediabetes 09/28/2022   Vitamin D  deficiency 09/28/2022   Abnormal CT of the chest    Other secondary pulmonary hypertension (HCC) 12/28/2021   Tachycardia 10/12/2021   Hyperlipidemia 10/12/2021   Acute bronchitis 11/16/2020   Rash and nonspecific skin eruption 09/18/2020   Upper respiratory infection, acute 06/10/2017   Bilateral hand pain 01/13/2017   Pain, joint, shoulder, left 01/13/2017   Pain in left ankle and joints of left foot 01/13/2017   High risk medication use 01/13/2017   Nausea 08/09/2016   Cough 03/09/2016   Class 1 obesity without serious comorbidity with body mass index (BMI) of 34.0 to 34.9 in adult 01/22/2016   Right foot pain 01/21/2016   Routine general medical examination at a health care facility 12/11/2013   Visit for screening mammogram 03/07/2012   Anemia, iron  deficiency 02/10/2012   Hemoptysis 09/07/2010   Sarcoidosis (HCC) 09/17/2008   Asthma in remission 09/03/2008   Allergic rhinitis due to pollen 09/03/2008    Current Outpatient Medications  Medication Sig Dispense Refill   atorvastatin  (LIPITOR) 20 MG tablet Take 1 tablet (20 mg total) by mouth daily. 90 tablet 3   azaTHIOprine (IMURAN) 50 MG tablet Take 50 mg by mouth daily.     Cholecalciferol (VITAMIN D3) 50 MCG (2000 UT) TABS Take 50 mcg by mouth daily at 12 noon.     iron  polysaccharides (NIFEREX) 150 MG capsule TAKE 1 CAPSULE(150 MG) BY MOUTH DAILY 90 capsule 3   metFORMIN  (GLUCOPHAGE -XR) 500 MG 24 hr tablet Take 1 tablet (500 mg total) by mouth daily with breakfast. 90 tablet 3   ondansetron  (ZOFRAN -ODT) 4 MG disintegrating tablet Take 1 tablet (4 mg total) by mouth every 8 (eight) hours as needed for nausea or vomiting. 20 tablet 0    predniSONE  (DELTASONE ) 10 MG tablet Take 1 tablet by mouth daily.     venlafaxine  XR (EFFEXOR -XR) 75 MG 24 hr capsule Take 1 capsule (75 mg total) by mouth daily with breakfast. 90 capsule 3   WEGOVY  1.7 MG/0.75ML SOAJ Inject 1.7 mg into the skin once a week.     No current facility-administered medications for this visit.    Allergies: Hydrocodone   Past Medical History:  Diagnosis Date   Abnormal chest x-ray    Anemia    Aortic atherosclerosis (HCC)    Hemoptysis    Pulmonary hypertension (HCC)    Sarcoid    bronch negative 09-2008 ACE 93   Tachycardia    Unspecified asthma(493.90)     Past Surgical History:  Procedure Laterality Date   BIOPSY  05/06/2022   Procedure: BIOPSY;  Surgeon: Meade Verdon RAMAN, MD;  Location: [facility] ENDOSCOPY;  Service: Pulmonary;;   BRONCHIAL WASHINGS  05/06/2022   Procedure: BRONCHIAL WASHINGS;  Surgeon: Meade Verdon RAMAN, MD;  Location: [facility] ENDOSCOPY;  Service: Pulmonary;;   BRONCHOSCOPY  09-2008   Negative   CESAREAN SECTION     HEMOSTASIS CONTROL  05/06/2022   Procedure: HEMOSTASIS CONTROL;  Surgeon: Meade Verdon RAMAN, MD;  Location: [facility] ENDOSCOPY;  Service: Pulmonary;;   TUBAL LIGATION     1995   VIDEO BRONCHOSCOPY N/A 05/06/2022   Procedure: VIDEO BRONCHOSCOPY WITHOUT FLUORO;  Surgeon: Meade Verdon RAMAN, MD;  Location: [facility] ENDOSCOPY;  Service:  Pulmonary;  Laterality: N/A;    Family History  Problem Relation Age of Onset   Hyperlipidemia Mother    Hypertension Mother    Cancer Mother        lung    Liver disease Mother    Obesity Mother    Diabetes Father    Hyperlipidemia Maternal Grandmother    Stroke Maternal Grandmother    Lung cancer Maternal Grandfather    Cancer Paternal Grandmother    Alcohol abuse Paternal Grandmother    Early death Paternal Grandmother    Asthma Paternal Grandfather    Healthy Daughter    Healthy Son     Social History   Tobacco Use   Smoking status: Former    Current packs/day: 0.00    Average packs/day: 0.3 packs/day for  10.0 years (2.5 ttl pk-yrs)    Types: Cigarettes    Start date: 2001    Quit date: 08/05/2008    Years since quitting: 15.6    Passive exposure: Past   Smokeless tobacco: Never  Substance Use Topics   Alcohol use: Yes    Subjective:   Right elbow pain x 2 week; no known injury or trauma; does feel pain radiating down into right hand;   Objective:  Vitals:   04/04/24 0908  BP: 108/78  Pulse: 88  Resp: 16  Temp: 98.2 F (36.8 C)  TempSrc: Oral  SpO2: 98%  Weight: 176 lb 4 oz (79.9 kg)  Height: 5' 3 (1.6 m)    General: Well developed, well nourished, in no acute distress  Skin : Warm and dry.  Head: Normocephalic and atraumatic  Lungs: Respirations unlabored;  Musculoskeletal: No deformities; no active joint inflammation  Extremities: No edema, cyanosis, clubbing  Vessels: Symmetric bilaterally  Neurologic: Alert and oriented; speech intact; face symmetrical; moves all extremities well; CNII-XII intact without focal deficit   Assessment:  1. Right elbow tendonitis     Plan:  Refer to orthopedist for injection; she can apply ice to area; continue Tylenol  as needed- already on prednisone ; follow up as needed otherwise.   No follow-ups on file.  Orders Placed This Encounter  Procedures   Ambulatory referral to Orthopedic Surgery    Referral Priority:   Routine    Referral Type:   Surgical    Referral Reason:   Specialty Services Required    Requested Specialty:   Orthopedic Surgery    Number of Visits Requested:   1    Requested Prescriptions    No prescriptions requested or ordered in this encounter

## 2024-04-10 DIAGNOSIS — R042 Hemoptysis: Secondary | ICD-10-CM | POA: Diagnosis not present

## 2024-04-10 DIAGNOSIS — Z5181 Encounter for therapeutic drug level monitoring: Secondary | ICD-10-CM | POA: Diagnosis not present

## 2024-04-10 DIAGNOSIS — D860 Sarcoidosis of lung: Secondary | ICD-10-CM | POA: Diagnosis not present

## 2024-04-17 ENCOUNTER — Ambulatory Visit: Admitting: Sports Medicine

## 2024-04-17 ENCOUNTER — Encounter: Payer: Self-pay | Admitting: Sports Medicine

## 2024-04-17 ENCOUNTER — Other Ambulatory Visit (INDEPENDENT_AMBULATORY_CARE_PROVIDER_SITE_OTHER): Payer: Self-pay

## 2024-04-17 DIAGNOSIS — M25521 Pain in right elbow: Secondary | ICD-10-CM | POA: Diagnosis not present

## 2024-04-17 DIAGNOSIS — M7701 Medial epicondylitis, right elbow: Secondary | ICD-10-CM | POA: Diagnosis not present

## 2024-04-17 MED ORDER — MELOXICAM 15 MG PO TABS: 15.0000 mg | ORAL_TABLET | Freq: Every day | ORAL | 0 refills | Status: DC

## 2024-04-17 NOTE — Progress Notes (Signed)
 Patient says that she has had constant pain and aching in the right elbow for a few months now. She denies any known injury to the elbow or arm. She has taken Tylenol  only as needed, and has used ice and heat to treat her pain. She does not get long term relief from these, but does note that the heat feels good. She denies any popping or clicking in the elbow. She will occasionally have shooting pain down the forearm and into the wrist, and has had to wrap the wrist to help with her pain. She also mentions having pain in the right shoulder and upper arm, as well as numbness in the hand.

## 2024-04-17 NOTE — Progress Notes (Addendum)
 Nancy Villarreal - 53 y.o. female MRN [id number]  Date of birth: [birth year]  Office Visit Note: Visit Date: 04/17/2024 PCP: Jason Leita Repine, FNP Referred by: Jason Leita Repine,*  Subjective: No chief complaint on file.  HPI: Nancy Villarreal is a pleasant 53 y.o. female who presents today for evaluation of right elbow pain.  Patient notes that the pain has been ongoing over the past several months, but less than a year.  She describes the pain as aching and radiating.  She reports that the pain extends down into her 2nd and 3rd fingers, as well as the wrist, up to her shoulder occasionally.  She additionally reports occasional numbness throughout her hand when she sleeps.  She additionally reports occasional increased warmth at the right elbow.  She denies any neck pain.  She denies any injury or change of activity around the onset of her pain.  She notes that she has trialed heat with some improvement of her symptoms.  She notes that she has not identified any exacerbating factors.  She additionally reports a history of a rheumatological workup 6 to 8 years ago for swelling in her 2nd and 3rd MCP joints, however notes that no diagnosis was established.  Per medical records, the patient was evaluated by another provider on 04/04/2024, ultimately leading to a referral to sports medicine.  Pertinent ROS were reviewed with the patient and found to be negative unless otherwise specified above in HPI.   Assessment & Plan: Visit Diagnoses:  1. Pain in right elbow   2. Medial epicondylitis of elbow, right    Patient presenting for evaluation of right medial elbow pain with radiation into the wrist and 2nd and 3rd fingers that has been ongoing for months.  Physical exam demonstrates preserved range of motion and strength of bilateral shoulders with pain elicited with right-sided speeds test.  Spurling's maneuver negative bilaterally.  Right elbow with preserved range of motion and  strength, and focal tenderness to palpation over the medial epicondyle without extension into the flexor tendons.  X-ray of the right elbow demonstrating only minimal arthritic change, given the patient's constellation of symptoms and physical exam demonstrating focal tenderness over the medial epicondyle, most likely diagnosis is medial epicondylitis.  Additionally given the patient's history of sarcoidosis, differential includes sarcoid arthropathy, however less likely given relatively painless range of motion and focal tenderness over the medial epicondyle.   Note written by Robert, Gallivan MD  Medical Resident, Sports Medicine Fellow - Attending Physician Addendum:   I have independently interviewed and examined the patient myself. I have discussed the above with the original author and agree with their documentation. My edits for correction/addition/clarification have been made, see any changes above and below.   In summary, pleasant 53 year old right-hand-dominant female with pain over the medial epicondyle that extends down the common flexor tendon origin.  No specific injury, no gross weakness.  Symptomatology consistent with medial epicondylitis vs. epicondylopathy.  Discussed all treatment options including short-term anti-inflammatory use, PT versus home rehab, counterforce bracing, injection therapy.  Patient would like to start with short course of anti-inflammatories, we will start her on meloxicam  15 mg to be taken consistently for the next 2-3 weeks and then discontinue.  We did print out a handout for a counterforce strap that she may purchase OTC and use with activity.  We will get her with our occupational therapist, Nate Moore to build a therapy regimen that she may stay consistent with over the course of the  next 6 weeks.  I will see her back in 1 month for further evaluation.  Additional treatment considerations: Extracorporeal shockwave therapy, diagnostic ultrasound, injection  therapy  Lonell Sprang, DO Primary Care Sports Medicine Physician  [facility] - Orthopedics   Follow-up: Return in about 1 month (around 05/18/2024) for R-elbow.   Meds & Orders:  Meds ordered this encounter  Medications   meloxicam  (MOBIC ) 15 MG tablet    Sig: Take 1 tablet (15 mg total) by mouth daily.    Dispense:  21 tablet    Refill:  0    Orders Placed This Encounter  Procedures   XR Elbow 2 Views Right   Ambulatory referral to Occupational Therapy     Procedures: No procedures performed      Clinical History: No specialty comments available.  She reports that she quit smoking about 15 years ago. Her smoking use included cigarettes. She started smoking about 24 years ago. She has a 2.5 pack-year smoking history. She has been exposed to tobacco smoke. She has never used smokeless tobacco.  Recent Labs    06/20/23 0834  HGBA1C 6.2    Objective:    Physical Exam  Gen: Well-appearing, in no acute distress; non-toxic CV: Well-perfused. Warm.  Resp: Breathing unlabored on room air; no wheezing. Psych: Fluid speech in conversation; appropriate affect; normal thought process  Ortho Exam - Left shoulder: Symmetrical compared to the contralateral side, no outward signs of trauma.  Full active range of motion in all planes.  5/5 strength with ER, IR, flexion, extension, adduction, and abduction.  Normal Hawking's, speeds, Neer's, empty can, and O'Brien's tests. - Right shoulder: Symmetrical compared to the contralateral side, no outward signs of trauma.  No focal tenderness to palpation.  Full active range of motion in all planes.  5/5 strength with ER, IR, flexion, extension, adduction, and abduction.  Normal Hawking's, Neer's, empty can, and O'Brien's tests.  Pain elicited in the anterior shoulder with speeds test. - Left elbow: Symmetrical compared to the contralateral sides, no outward signs of trauma.  No focal tenderness to palpation.  Full range of motion with  flexion and extension.  5/5 strength with flexion, extension, pronation, and supination. - Right elbow: Symmetrical compared to the contralateral sides, no outward signs of trauma. Tenderness to palpation over the medial epicondyle.  No tenderness on the flexor tendons of the forearm.  Full range of motion with flexion and extension.  5/5 strength with flexion, extension, pronation, and supination.  No laxity or pain elicited with valgus stressing of the elbow. - C-spine: No tenderness to palpation.  Negative Spurling's bilaterally.  Imaging: XR Elbow 2 Views Right Result Date: 04/17/2024 2 views of the right elbow including AP and lateral film were ordered and reviewed by myself today.  X-rays demonstrate very small osteophytic spurring off the coronoid of the elbow but no significant arthritic change.  No joint effusion.   Past Medical/Family/Surgical/Social History: Medications & Allergies reviewed per EMR, new medications updated. Patient Active Problem List   Diagnosis Date Noted   Insomnia 11/01/2022   Current chronic use of systemic steroids 09/28/2022   Former smoker 09/28/2022   Prediabetes 09/28/2022   Vitamin D  deficiency 09/28/2022   Abnormal CT of the chest    Other secondary pulmonary hypertension (HCC) 12/28/2021   Tachycardia 10/12/2021   Hyperlipidemia 10/12/2021   Acute bronchitis 11/16/2020   Rash and nonspecific skin eruption 09/18/2020   Upper respiratory infection, acute 06/10/2017   Bilateral hand pain  01/13/2017   Pain, joint, shoulder, left 01/13/2017   Pain in left ankle and joints of left foot 01/13/2017   High risk medication use 01/13/2017   Nausea 08/09/2016   Cough 03/09/2016   Class 1 obesity without serious comorbidity with body mass index (BMI) of 34.0 to 34.9 in adult 01/22/2016   Right foot pain 01/21/2016   Routine general medical examination at a health care facility 12/11/2013   Visit for screening mammogram 03/07/2012   Anemia, iron   deficiency 02/10/2012   Hemoptysis 09/07/2010   Sarcoidosis (HCC) 09/17/2008   Asthma in remission 09/03/2008   Allergic rhinitis due to pollen 09/03/2008   Past Medical History:  Diagnosis Date   Abnormal chest x-ray    Anemia    Aortic atherosclerosis (HCC)    Hemoptysis    Pulmonary hypertension (HCC)    Sarcoid    bronch negative 09-2008 ACE 93   Tachycardia    Unspecified asthma(493.90)    Family History  Problem Relation Age of Onset   Hyperlipidemia Mother    Hypertension Mother    Cancer Mother        lung    Liver disease Mother    Obesity Mother    Diabetes Father    Hyperlipidemia Maternal Grandmother    Stroke Maternal Grandmother    Lung cancer Maternal Grandfather    Cancer Paternal Grandmother    Alcohol abuse Paternal Grandmother    Early death Paternal Grandmother    Asthma Paternal Grandfather    Healthy Daughter    Healthy Son    Past Surgical History:  Procedure Laterality Date   BIOPSY  05/06/2022   Procedure: BIOPSY;  Surgeon: Meade Verdon RAMAN, MD;  Location: [facility] ENDOSCOPY;  Service: Pulmonary;;   BRONCHIAL WASHINGS  05/06/2022   Procedure: BRONCHIAL WASHINGS;  Surgeon: Meade Verdon RAMAN, MD;  Location: [facility] ENDOSCOPY;  Service: Pulmonary;;   BRONCHOSCOPY  09-2008   Negative   CESAREAN SECTION     HEMOSTASIS CONTROL  05/06/2022   Procedure: HEMOSTASIS CONTROL;  Surgeon: Meade Verdon RAMAN, MD;  Location: [facility] ENDOSCOPY;  Service: Pulmonary;;   TUBAL LIGATION     1995   VIDEO BRONCHOSCOPY N/A 05/06/2022   Procedure: VIDEO BRONCHOSCOPY WITHOUT FLUORO;  Surgeon: Meade Verdon RAMAN, MD;  Location: [facility] ENDOSCOPY;  Service: Pulmonary;  Laterality: N/A;   Social History   Occupational History   Occupation: Firefighter   Occupation: Economist  Tobacco Use   Smoking status: Former    Current packs/day: 0.00    Average packs/day: 0.3 packs/day for 10.0 years (2.5 ttl pk-yrs)    Types: Cigarettes    Start date: 2001    Quit date: 08/05/2008    Years  since quitting: 15.7    Passive exposure: Past   Smokeless tobacco: Never  Vaping Use   Vaping status: Never Used  Substance and Sexual Activity   Alcohol use: Yes   Drug use: Never   Sexual activity: Not Currently    Birth control/protection: Surgical

## 2024-04-21 ENCOUNTER — Encounter: Payer: Self-pay | Admitting: Internal Medicine

## 2024-04-24 ENCOUNTER — Encounter: Payer: Self-pay | Admitting: Family

## 2024-04-29 DIAGNOSIS — E669 Obesity, unspecified: Secondary | ICD-10-CM | POA: Diagnosis not present

## 2024-04-30 ENCOUNTER — Encounter: Admitting: Rehabilitative and Restorative Service Providers"

## 2024-05-03 NOTE — Telephone Encounter (Signed)
 Dr Villarreal- please see chain of msgs from the pt and advise if you can write a letter answering the disability questions she outlined below  Thanks!  Nancy Villarreal  P [facility] (supporting Nancy JONETTA Neysa, MD)9 days ago    I have not switched care and currently have an appointment scheduled with Dr Villarreal in October, he is the doctor on record since disability started in 2023. Thank you    You  Nancy ORN [place]9 days ago    Hi Nancy Villarreal, I see that you have seen Dr Alaine at [facility] most recently for your pulmonary care. Are you still planning to follow with our practice? If you have switched your care to another practice, I would recommend you let them complete your forms.     Nancy Villarreal  P [facility] (supporting Nancy JONETTA Neysa, MD)12 days ago    Action needed by the physician:   We will need clarification with regards to: minimize walking less than 100 yards. Please clarify: do you have a walker What is the frequency and duration of your walking limitations per day. How long will these restriction be in place.     Corean Chars HR Shared Service Consultant    [email or web address]  Telephone: [phone]

## 2024-05-03 NOTE — Telephone Encounter (Signed)
 I last saw this pt in July, 2024. I have no basis for answering new questions until I see her again.

## 2024-05-08 ENCOUNTER — Other Ambulatory Visit: Payer: Self-pay | Admitting: Sports Medicine

## 2024-05-08 DIAGNOSIS — M7701 Medial epicondylitis, right elbow: Secondary | ICD-10-CM

## 2024-05-08 DIAGNOSIS — M25521 Pain in right elbow: Secondary | ICD-10-CM

## 2024-05-14 ENCOUNTER — Ambulatory Visit (INDEPENDENT_AMBULATORY_CARE_PROVIDER_SITE_OTHER): Admitting: Sports Medicine

## 2024-05-14 ENCOUNTER — Encounter: Payer: Self-pay | Admitting: Sports Medicine

## 2024-05-14 DIAGNOSIS — M7701 Medial epicondylitis, right elbow: Secondary | ICD-10-CM

## 2024-05-14 DIAGNOSIS — M25521 Pain in right elbow: Secondary | ICD-10-CM | POA: Diagnosis not present

## 2024-05-14 NOTE — Progress Notes (Signed)
 Nancy Villarreal - 53 y.o. female MRN [id number]  Date of birth: [birth year]  Office Visit Note: Visit Date: 05/14/2024 PCP: Jason Leita Repine, FNP Referred by: Jason Leita Repine,*  Subjective: Chief Complaint  Patient presents with   Right Elbow - Follow-up   HPI: Nancy Villarreal is a pleasant 53 y.o. female who presents today for chronic right elbow pain.  She is doing much better since her visit 1 month ago.  She did take her meloxicam  15 mg for the first 2-3 weeks and then discontinued.  She was unable to start with OT as she was sick at that time but question whether she needs this versus HEP.  In general she is at least 75% improved.  She did purchase the counterforce strap and does wear at times with activity.  Pertinent ROS were reviewed with the patient and found to be negative unless otherwise specified above in HPI.   Assessment & Plan: Visit Diagnoses:  1. Medial epicondylitis of elbow, right   2. Pain in right elbow    Plan: Impression is improving chronic right elbow pain with epicondylitis.  She received good improvement in pain with reduction of her inflammatory burden with short course of meloxicam  15 mg, she will discontinue this going forward.  Given that she is improved, she would prefer home therapy versus referral to OT/formal PT.  We did print out a customized handout and did review these exercises with her in the room today.  Will perform once daily for the next 6 weeks.  Also recommended topical Voltaren  gel only as needed if her pain is flared up.  Will stay consistent with the exercises for 6 weeks and she will follow-up with me only if not significantly improved.  Follow-up: Return if symptoms worsen or fail to improve.   Meds & Orders: No orders of the defined types were placed in this encounter.  No orders of the defined types were placed in this encounter.    Procedures: No procedures performed      Clinical History: No specialty  comments available.  She reports that she quit smoking about 15 years ago. Her smoking use included cigarettes. She started smoking about 24 years ago. She has a 2.5 pack-year smoking history. She has been exposed to tobacco smoke. She has never used smokeless tobacco.  Recent Labs    06/20/23 0834  HGBA1C 6.2    Objective:    Physical Exam  Gen: Well-appearing, in no acute distress; non-toxic CV: Well-perfused. Warm.  Resp: Breathing unlabored on room air; no wheezing. Psych: Fluid speech in conversation; appropriate affect; normal thought process  Ortho Exam - Right elbow: + Mild TTP over the lateral epicondyle but improved from previous visits.  Positive Maudsley's test, there is improved tenderness with only mild discomfort with resisted wrist supination/pronation.  No redness swelling or effusion about the elbow joint, full range of motion.  Imaging: No results found.  Past Medical/Family/Surgical/Social History: Medications & Allergies reviewed per EMR, new medications updated. Patient Active Problem List   Diagnosis Date Noted   Insomnia 11/01/2022   Current chronic use of systemic steroids 09/28/2022   Former smoker 09/28/2022   Prediabetes 09/28/2022   Vitamin D  deficiency 09/28/2022   Abnormal CT of the chest    Other secondary pulmonary hypertension (HCC) 12/28/2021   Tachycardia 10/12/2021   Hyperlipidemia 10/12/2021   Acute bronchitis 11/16/2020   Rash and nonspecific skin eruption 09/18/2020   Upper respiratory infection, acute 06/10/2017   Bilateral  hand pain 01/13/2017   Pain, joint, shoulder, left 01/13/2017   Pain in left ankle and joints of left foot 01/13/2017   High risk medication use 01/13/2017   Nausea 08/09/2016   Cough 03/09/2016   Class 1 obesity without serious comorbidity with body mass index (BMI) of 34.0 to 34.9 in adult 01/22/2016   Right foot pain 01/21/2016   Routine general medical examination at a health care facility 12/11/2013    Visit for screening mammogram 03/07/2012   Anemia, iron  deficiency 02/10/2012   Hemoptysis 09/07/2010   Sarcoidosis (HCC) 09/17/2008   Asthma in remission 09/03/2008   Allergic rhinitis due to pollen 09/03/2008   Past Medical History:  Diagnosis Date   Abnormal chest x-ray    Anemia    Aortic atherosclerosis (HCC)    Hemoptysis    Pulmonary hypertension (HCC)    Sarcoid    bronch negative 09-2008 ACE 93   Tachycardia    Unspecified asthma(493.90)    Family History  Problem Relation Age of Onset   Hyperlipidemia Mother    Hypertension Mother    Cancer Mother        lung    Liver disease Mother    Obesity Mother    Diabetes Father    Hyperlipidemia Maternal Grandmother    Stroke Maternal Grandmother    Lung cancer Maternal Grandfather    Cancer Paternal Grandmother    Alcohol abuse Paternal Grandmother    Early death Paternal Grandmother    Asthma Paternal Grandfather    Healthy Daughter    Healthy Son    Past Surgical History:  Procedure Laterality Date   BIOPSY  05/06/2022   Procedure: BIOPSY;  Surgeon: Meade Verdon RAMAN, MD;  Location: [facility] ENDOSCOPY;  Service: Pulmonary;;   BRONCHIAL WASHINGS  05/06/2022   Procedure: BRONCHIAL WASHINGS;  Surgeon: Meade Verdon RAMAN, MD;  [facility] ENDOSCOPY;  Service: Pulmonary;;   BRONCHOSCOPY  09-2008   Negative   CESAREAN SECTION     HEMOSTASIS CONTROL  05/06/2022   Procedure: HEMOSTASIS CONTROL;  Surgeon: Meade Verdon RAMAN, MD;  Location: [facility] ENDOSCOPY;  Service: Pulmonary;;   TUBAL LIGATION     1995   VIDEO BRONCHOSCOPY N/A 05/06/2022   Procedure: VIDEO BRONCHOSCOPY WITHOUT FLUORO;  Surgeon: Meade Verdon RAMAN, MD;  Location: [facility] ENDOSCOPY;  Service: Pulmonary;  Laterality: N/A;   Social History   Occupational History   Occupation: Firefighter   Occupation: Economist  Tobacco Use   Smoking status: Former    Current packs/day: 0.00    Average packs/day: 0.3 packs/day for 10.0 years (2.5 ttl pk-yrs)    Types:  Cigarettes    Start date: 2001    Quit date: 08/05/2008    Years since quitting: 15.7    Passive exposure: Past   Smokeless tobacco: Never  Vaping Use   Vaping status: Never Used  Substance and Sexual Activity   Alcohol use: Yes   Drug use: Never   Sexual activity: Not Currently    Birth control/protection: Surgical

## 2024-05-14 NOTE — Progress Notes (Signed)
 Patient says that she is feeling much better than she was at her last visit. Occasionally she will feel some discomfort with specific movements or lifts. She does think that the Meloxicam  helped when she was taking it, and she has discontinued without return of symptoms. She had to reschedule occupational therapy as she got sick, and has not yet scheduled that visit as she was waiting for this follow up appointment to determine whether she still needed therapy.  Patient was instructed in 10 minutes of therapeutic exercises for right elbow to improve strength, ROM and function according to my instructions and plan of care by a Certified Athletic Trainer during the office visit. A customized handout was provided and demonstration of proper technique shown and discussed. Patient did perform exercises and demonstrate understanding through teachback.  All questions discussed and answered.

## 2024-05-15 ENCOUNTER — Ambulatory Visit: Admitting: Sports Medicine

## 2024-05-17 ENCOUNTER — Encounter: Payer: Self-pay | Admitting: Family

## 2024-05-21 DIAGNOSIS — R042 Hemoptysis: Secondary | ICD-10-CM | POA: Diagnosis not present

## 2024-05-21 DIAGNOSIS — D860 Sarcoidosis of lung: Secondary | ICD-10-CM | POA: Diagnosis not present

## 2024-05-30 DIAGNOSIS — E669 Obesity, unspecified: Secondary | ICD-10-CM | POA: Diagnosis not present

## 2024-06-03 NOTE — Progress Notes (Deleted)
 Patient ID: Nancy Villarreal. Brien, female    DOB: 1970-09-08, 53 y.o.   MRN: [id number]  HPI F former smoker followed here with sarcoid Stage III,  Bronch NEG 09/24/08- dx based on mediastinal adenopathy/ interstitial disease,chronic elev ACE , response to steroid. Complicated by Iron  def anemia, plasma cell disorder. Bronch 05/06/22 PFT 10/11/16- ACE 09/30/16- 86H  ACE 12/07/17- 97, up from 89 in 2018 ACE 01/08/19- 67 WNL ACE 08/04/20- 80H Immunoglobulins 12/19/16- IgG 2,669 HRCT 2/29/23-highly suggestive of pulmonary sarcoidosis with some evidence of developing mass of fibrosis: PFT 12/30/22-moderate obst, slight response to BD, DLCO WNL, TLC 85%  ===================================================================================================================    03/13/23- 53yoF former smoker(2.5 pk yrs) followed here with Sarcoid Stage III ( Bronch Pos 05/06/22- Nonnecrotizing granulomas,  mediastinal adenopathy/ interstitial disease, chronic elevation of ACE, response to steroid),  Hemoptysis, Bronchitis, Complicated by Iron  def anemia, plasma cell disorder, Obesity,  -Albuterol  hfa, prednisone  10 mg daily15 mg alt 20 mg qod, Has asked PCP for [employer] for Sarcoid- Lab 12/28/21- ACE 48 wnl,  Body weight today 198 lbs -----Coughing up blood, bright red pt states she occ gets this. States she doesn't have a cough  Current episode is sitting quickly now.  Spontaneous hemoptysis episodes come on every couple of months, no apparent trigger, and last a few days.  No chest pain or changes otherwise noted.  No relation to menstrual periods. She is currently taking prednisone  10 mg daily.  We discussed a strategy of trying to hold 10 mg but increasing to 15 or 20 mg as needed for bleeding flares.  We will refill her supply.  We discussed referral to a university and we discussed alternative of methotrexate .  Currently she is not interested in either. We discussed steroid withdrawal.  Which she  gets down to 10 mg daily she begins to experience aching in her knees making stairs difficult.  She understands need to keep steroid doses low.  Once sarcoid finally stabilizes, we discussed withdrawal strategy and potential for adrenal insufficiency.[name] 12/30/22-moderate obst, slight response to BD, DLCO WNL, TLC 85% CXR 11/25/22- IMPRESSION: Chronic lung changes without evidence of acute cardiopulmonary Disease HRCT 09/02/22- IMPRESSION: 1. Moderate pulmonary fibrosis, with an upper lobe and perihilar predominance, characterized by fine nodular and confluent consolidative opacity and architectural distortion, with many nodules concentrated along the fissures and pleura (i.e. perilymphatic distribution). If characterized by ATS pulmonary fibrosis criteria, findings are in an alternative diagnosis (not UIP) pattern, and specifically highly suggestive of pulmonary sarcoidosis with some evidence of developing mass of fibrosis: Diagnosis of Idiopathic Pulmonary Fibrosis: An Official ATS/ERS/JRS/ALAT Clinical Practice Guideline. Am JINNY [employer] Crit Care Med Vol 198, Iss 5, [phone], May 06 2017. 2. Numerous coarsely calcified mediastinal and bilateral hilar lymph nodes, consistent with nodal sarcoidosis. 3. Lobular air trapping on expiratory phase imaging, suggestive of small airways disease. 4. Enlargement of the main pulmonary artery, as can be seen in pulmonary hypertension. Aortic Atherosclerosis (ICD10-I70.0).  06/06/24- 53yoF former smoker(2.5 pk yrs) followed here with Sarcoid Stage III ( Bronch Pos 05/06/22- Nonnecrotizing granulomas,  mediastinal adenopathy/ interstitial disease, chronic elevation of ACE, response to steroid),  Hemoptysis, Bronchitis, Complicated by Iron  def anemia, plasma cell disorder, Obesity,  -Albuterol  hfa, prednisone  10 mg daily15 mg alt 20 mg qod, Has asked PCP for [employer] for Sarcoid- Following now with Dr [facility] - Imuran 75 mg  daily w Bactrim 3x/week. LOV 05/31/24      Review of Systems-see HPI   + =  positive HEENT:   No headaches,  Difficulty swallowing,  Tooth/dental problems,  Sore throat,                No sneezing, itching, ear ache, nasal congestion, post nasal drip,  CV:  No- chest pain,  No-PND, swelling in lower extremities, anasarca, dizziness, palpitations GI  No heartburn, indigestion, abdominal pain, nausea, vomiting,  Resp:  No acute shortness of breath with exertion .  No excess mucus, +productive cough,                  non-productive cough,  +recent coughing up of blood. + change in color of mucus.  No wheezing.  Skin: no rash or lesions. GU: MS:+ joint pain .  No decreased range of motion.  No back pain. Psych:  No change in mood or affect. No depression or anxiety.  No memory loss.  Objective:   Physical Exam General- Alert, Oriented, Affect-appropriate, Distress- none acute.  Skin-   + mild acne Lymphadenopathy- none Head- atraumatic            Eyes- Gross vision intact, PERRLA, conjunctivae clear secretions            Ears- Hearing, canals normal            Nose- Clear, No-Septal dev, mucus, polyps, erosion, perforation             Throat- Mallampati II , mucosa-clear , drainage- none, tonsils present. , + hoarse from cough                    Neck- flexible , trachea midline, no stridor , thyroid  nl, carotid no bruit Chest - symmetrical excursion , unlabored           Heart/CV- RRR , no murmur , no gallop  , no rub, nl s1 s2                           - JVD- none , edema- none, stasis changes- none, varices- none           Lung-   Cough-none, + somewhat coarse in bases, unlabored, wheeze- none,  dullness-none, rub- none           Chest wall-  Abd-  Br/ Gen/ Rectal- Not done, not indicated Extrem- cyanosis- none, clubbing, none, atrophy- none, strength- nl Neuro- grossly intact to observation

## 2024-06-05 ENCOUNTER — Telehealth: Payer: Self-pay | Admitting: *Deleted

## 2024-06-05 NOTE — Telephone Encounter (Signed)
 Called and spoke with patient regarding her appointment with Dr. Neysa tomorrow.  She is being followed by Dr. Alaine with [facility] currently and has been seen recently.  She states this appointment had been set up long ago.  She stated it was fine to cancel this appointment since she is being followed by Dr. McQuaid currently.  Appointment cancelled.  Nothing further needed.

## 2024-06-06 ENCOUNTER — Ambulatory Visit: Admitting: Internal Medicine

## 2024-06-18 DIAGNOSIS — N951 Menopausal and female climacteric states: Secondary | ICD-10-CM | POA: Diagnosis not present

## 2024-06-18 DIAGNOSIS — Z124 Encounter for screening for malignant neoplasm of cervix: Secondary | ICD-10-CM | POA: Diagnosis not present

## 2024-06-18 DIAGNOSIS — Z01419 Encounter for gynecological examination (general) (routine) without abnormal findings: Secondary | ICD-10-CM | POA: Diagnosis not present

## 2024-06-18 DIAGNOSIS — Z6831 Body mass index (BMI) 31.0-31.9, adult: Secondary | ICD-10-CM | POA: Diagnosis not present

## 2024-06-18 DIAGNOSIS — Z1151 Encounter for screening for human papillomavirus (HPV): Secondary | ICD-10-CM | POA: Diagnosis not present

## 2024-06-18 DIAGNOSIS — Z13820 Encounter for screening for osteoporosis: Secondary | ICD-10-CM | POA: Diagnosis not present

## 2024-06-29 DIAGNOSIS — E669 Obesity, unspecified: Secondary | ICD-10-CM | POA: Diagnosis not present

## 2024-07-08 ENCOUNTER — Encounter: Payer: Self-pay | Admitting: Radiology

## 2024-07-11 DIAGNOSIS — N950 Postmenopausal bleeding: Secondary | ICD-10-CM | POA: Diagnosis not present

## 2024-07-30 DIAGNOSIS — E669 Obesity, unspecified: Secondary | ICD-10-CM | POA: Diagnosis not present
